# Patient Record
Sex: Female | Born: 1940 | Race: White | Hispanic: No | Smoking: Never smoker
Health system: Southern US, Community
[De-identification: ages and names within clinical notes are randomized; demographics above are authoritative.]

## PROBLEM LIST (undated history)

## (undated) DIAGNOSIS — I10 Essential (primary) hypertension: Secondary | ICD-10-CM

## (undated) DIAGNOSIS — K5790 Diverticulosis of intestine, part unspecified, without perforation or abscess without bleeding: Secondary | ICD-10-CM

## (undated) DIAGNOSIS — M109 Gout, unspecified: Secondary | ICD-10-CM

## (undated) DIAGNOSIS — G4730 Sleep apnea, unspecified: Secondary | ICD-10-CM

## (undated) DIAGNOSIS — R609 Edema, unspecified: Secondary | ICD-10-CM

## (undated) DIAGNOSIS — C50919 Malignant neoplasm of unspecified site of unspecified female breast: Secondary | ICD-10-CM

## (undated) DIAGNOSIS — M1990 Unspecified osteoarthritis, unspecified site: Secondary | ICD-10-CM

## (undated) DIAGNOSIS — I82409 Acute embolism and thrombosis of unspecified deep veins of unspecified lower extremity: Secondary | ICD-10-CM

## (undated) DIAGNOSIS — I219 Acute myocardial infarction, unspecified: Secondary | ICD-10-CM

## (undated) DIAGNOSIS — I2510 Atherosclerotic heart disease of native coronary artery without angina pectoris: Secondary | ICD-10-CM

## (undated) DIAGNOSIS — G20 Parkinson's disease: Secondary | ICD-10-CM

## (undated) DIAGNOSIS — J45909 Unspecified asthma, uncomplicated: Secondary | ICD-10-CM

## (undated) DIAGNOSIS — E785 Hyperlipidemia, unspecified: Secondary | ICD-10-CM

## (undated) DIAGNOSIS — I252 Old myocardial infarction: Secondary | ICD-10-CM

## (undated) HISTORY — DX: Sleep apnea, unspecified: G47.30

## (undated) HISTORY — PX: FLEXIBLE SIGMOIDOSCOPY: SHX1649

## (undated) HISTORY — DX: Unspecified asthma, uncomplicated: J45.909

## (undated) HISTORY — PX: HEMICOLECTOMY: SHX854

## (undated) HISTORY — PX: BREAST LUMPECTOMY: SHX2

## (undated) HISTORY — PX: APPENDECTOMY: SHX54

## (undated) HISTORY — DX: Hyperlipidemia, unspecified: E78.5

## (undated) HISTORY — PX: CATARACT EXTRACTION: SUR2

## (undated) HISTORY — DX: Acute myocardial infarction, unspecified: I21.9

## (undated) HISTORY — DX: Edema, unspecified: R60.9

## (undated) HISTORY — DX: Diverticulosis of intestine, part unspecified, without perforation or abscess without bleeding: K57.90

## (undated) HISTORY — DX: Atherosclerotic heart disease of native coronary artery without angina pectoris: I25.10

## (undated) HISTORY — DX: Gout, unspecified: M10.9

## (undated) HISTORY — DX: Acute embolism and thrombosis of unspecified deep veins of unspecified lower extremity: I82.409

## (undated) HISTORY — DX: Unspecified osteoarthritis, unspecified site: M19.90

## (undated) HISTORY — PX: COLONOSCOPY: SHX174

## (undated) HISTORY — PX: ABDOMINAL HYSTERECTOMY: SHX81

---

## 2002-10-08 DIAGNOSIS — I219 Acute myocardial infarction, unspecified: Secondary | ICD-10-CM

## 2002-10-08 HISTORY — DX: Acute myocardial infarction, unspecified: I21.9

## 2002-10-08 HISTORY — PX: ORIF ANKLE FRACTURE: SUR919

## 2004-06-09 HISTORY — PX: ESOPHAGOGASTRODUODENOSCOPY: SHX1529

## 2004-08-30 ENCOUNTER — Ambulatory Visit: Payer: Self-pay | Admitting: Oncology

## 2004-09-07 ENCOUNTER — Inpatient Hospital Stay: Payer: Self-pay | Admitting: Internal Medicine

## 2004-09-07 ENCOUNTER — Other Ambulatory Visit: Payer: Self-pay

## 2004-09-08 ENCOUNTER — Other Ambulatory Visit: Payer: Self-pay

## 2004-09-12 ENCOUNTER — Other Ambulatory Visit: Payer: Self-pay

## 2004-10-10 ENCOUNTER — Encounter: Payer: Self-pay | Admitting: Internal Medicine

## 2004-11-08 ENCOUNTER — Encounter: Payer: Self-pay | Admitting: Internal Medicine

## 2004-12-06 ENCOUNTER — Encounter: Payer: Self-pay | Admitting: Internal Medicine

## 2005-01-06 ENCOUNTER — Encounter: Payer: Self-pay | Admitting: Internal Medicine

## 2005-01-09 ENCOUNTER — Encounter: Payer: Self-pay | Admitting: Internal Medicine

## 2005-02-05 ENCOUNTER — Encounter: Payer: Self-pay | Admitting: Internal Medicine

## 2005-03-08 ENCOUNTER — Encounter: Payer: Self-pay | Admitting: Internal Medicine

## 2005-03-24 ENCOUNTER — Other Ambulatory Visit: Payer: Self-pay

## 2005-03-24 ENCOUNTER — Emergency Department: Payer: Self-pay | Admitting: Unknown Physician Specialty

## 2005-04-07 ENCOUNTER — Encounter: Payer: Self-pay | Admitting: Internal Medicine

## 2005-05-06 ENCOUNTER — Ambulatory Visit: Payer: Self-pay

## 2005-05-08 ENCOUNTER — Encounter: Payer: Self-pay | Admitting: Internal Medicine

## 2005-05-11 ENCOUNTER — Ambulatory Visit: Payer: Self-pay | Admitting: Internal Medicine

## 2005-05-11 ENCOUNTER — Ambulatory Visit: Payer: Self-pay

## 2005-05-25 ENCOUNTER — Ambulatory Visit: Payer: Self-pay | Admitting: Surgery

## 2005-06-05 ENCOUNTER — Ambulatory Visit: Payer: Self-pay | Admitting: Surgery

## 2005-06-08 ENCOUNTER — Encounter: Payer: Self-pay | Admitting: Internal Medicine

## 2005-07-08 ENCOUNTER — Encounter: Payer: Self-pay | Admitting: Internal Medicine

## 2005-08-06 ENCOUNTER — Emergency Department: Payer: Self-pay | Admitting: Emergency Medicine

## 2005-08-08 ENCOUNTER — Encounter: Payer: Self-pay | Admitting: Internal Medicine

## 2005-09-07 ENCOUNTER — Encounter: Payer: Self-pay | Admitting: Internal Medicine

## 2005-09-07 ENCOUNTER — Ambulatory Visit: Payer: Self-pay | Admitting: Oncology

## 2005-10-08 ENCOUNTER — Encounter: Payer: Self-pay | Admitting: Internal Medicine

## 2005-11-08 ENCOUNTER — Encounter: Payer: Self-pay | Admitting: Internal Medicine

## 2005-12-06 ENCOUNTER — Encounter: Payer: Self-pay | Admitting: Internal Medicine

## 2006-01-06 ENCOUNTER — Encounter: Payer: Self-pay | Admitting: Internal Medicine

## 2006-02-05 ENCOUNTER — Encounter: Payer: Self-pay | Admitting: Internal Medicine

## 2006-03-04 ENCOUNTER — Ambulatory Visit: Payer: Self-pay | Admitting: Internal Medicine

## 2006-03-08 ENCOUNTER — Encounter: Payer: Self-pay | Admitting: Internal Medicine

## 2006-03-22 ENCOUNTER — Ambulatory Visit: Payer: Self-pay | Admitting: Internal Medicine

## 2006-04-07 ENCOUNTER — Encounter: Payer: Self-pay | Admitting: Internal Medicine

## 2006-05-08 ENCOUNTER — Encounter: Payer: Self-pay | Admitting: Internal Medicine

## 2006-06-08 ENCOUNTER — Encounter: Payer: Self-pay | Admitting: Internal Medicine

## 2006-07-08 ENCOUNTER — Encounter: Payer: Self-pay | Admitting: Internal Medicine

## 2006-08-08 ENCOUNTER — Encounter: Payer: Self-pay | Admitting: Internal Medicine

## 2006-08-14 ENCOUNTER — Emergency Department: Payer: Self-pay | Admitting: General Practice

## 2006-09-07 ENCOUNTER — Encounter: Payer: Self-pay | Admitting: Internal Medicine

## 2006-09-27 ENCOUNTER — Ambulatory Visit: Payer: Self-pay | Admitting: Oncology

## 2006-10-08 ENCOUNTER — Encounter: Payer: Self-pay | Admitting: Internal Medicine

## 2006-11-08 ENCOUNTER — Encounter: Payer: Self-pay | Admitting: Internal Medicine

## 2006-11-08 ENCOUNTER — Ambulatory Visit: Payer: Self-pay | Admitting: Oncology

## 2006-12-07 ENCOUNTER — Encounter: Payer: Self-pay | Admitting: Internal Medicine

## 2007-01-07 ENCOUNTER — Encounter: Payer: Self-pay | Admitting: Internal Medicine

## 2007-02-06 ENCOUNTER — Encounter: Payer: Self-pay | Admitting: Internal Medicine

## 2007-03-09 ENCOUNTER — Encounter: Payer: Self-pay | Admitting: Internal Medicine

## 2007-04-08 ENCOUNTER — Encounter: Payer: Self-pay | Admitting: Internal Medicine

## 2007-05-09 ENCOUNTER — Encounter: Payer: Self-pay | Admitting: Internal Medicine

## 2007-06-09 ENCOUNTER — Encounter: Payer: Self-pay | Admitting: Internal Medicine

## 2007-09-09 ENCOUNTER — Ambulatory Visit: Payer: Self-pay | Admitting: Oncology

## 2007-09-29 ENCOUNTER — Ambulatory Visit: Payer: Self-pay | Admitting: Oncology

## 2007-10-09 ENCOUNTER — Ambulatory Visit: Payer: Self-pay | Admitting: Oncology

## 2008-10-07 ENCOUNTER — Ambulatory Visit: Payer: Self-pay | Admitting: Internal Medicine

## 2008-10-08 ENCOUNTER — Ambulatory Visit: Payer: Self-pay | Admitting: Oncology

## 2008-10-20 ENCOUNTER — Ambulatory Visit: Payer: Self-pay | Admitting: Oncology

## 2008-11-08 ENCOUNTER — Ambulatory Visit: Payer: Self-pay | Admitting: Oncology

## 2009-05-09 ENCOUNTER — Ambulatory Visit: Payer: Self-pay | Admitting: Internal Medicine

## 2009-07-19 ENCOUNTER — Encounter: Payer: Self-pay | Admitting: Cardiology

## 2009-08-08 ENCOUNTER — Encounter: Payer: Self-pay | Admitting: Cardiology

## 2009-12-15 ENCOUNTER — Ambulatory Visit: Payer: Self-pay | Admitting: Oncology

## 2010-01-05 ENCOUNTER — Ambulatory Visit: Payer: Self-pay | Admitting: Oncology

## 2010-01-06 ENCOUNTER — Ambulatory Visit: Payer: Self-pay | Admitting: Oncology

## 2010-02-01 ENCOUNTER — Ambulatory Visit: Payer: Self-pay | Admitting: Oncology

## 2010-12-29 ENCOUNTER — Ambulatory Visit: Payer: Self-pay | Admitting: Surgery

## 2011-03-29 ENCOUNTER — Ambulatory Visit: Payer: Self-pay | Admitting: Internal Medicine

## 2011-07-17 ENCOUNTER — Ambulatory Visit: Payer: Self-pay | Admitting: Ophthalmology

## 2011-07-24 ENCOUNTER — Ambulatory Visit: Payer: Self-pay | Admitting: Ophthalmology

## 2011-07-25 ENCOUNTER — Ambulatory Visit: Payer: Self-pay | Admitting: Internal Medicine

## 2013-01-22 ENCOUNTER — Ambulatory Visit: Payer: Self-pay | Admitting: Internal Medicine

## 2014-02-21 DIAGNOSIS — I10 Essential (primary) hypertension: Secondary | ICD-10-CM | POA: Insufficient documentation

## 2014-02-21 DIAGNOSIS — G4733 Obstructive sleep apnea (adult) (pediatric): Secondary | ICD-10-CM | POA: Insufficient documentation

## 2014-02-21 DIAGNOSIS — M109 Gout, unspecified: Secondary | ICD-10-CM | POA: Insufficient documentation

## 2014-02-23 ENCOUNTER — Ambulatory Visit: Payer: Self-pay

## 2014-07-09 DIAGNOSIS — N183 Chronic kidney disease, stage 3 unspecified: Secondary | ICD-10-CM | POA: Insufficient documentation

## 2014-07-09 DIAGNOSIS — E1122 Type 2 diabetes mellitus with diabetic chronic kidney disease: Secondary | ICD-10-CM | POA: Insufficient documentation

## 2014-08-06 ENCOUNTER — Ambulatory Visit: Payer: Self-pay

## 2014-08-10 DIAGNOSIS — I82509 Chronic embolism and thrombosis of unspecified deep veins of unspecified lower extremity: Secondary | ICD-10-CM | POA: Insufficient documentation

## 2014-09-10 DIAGNOSIS — E782 Mixed hyperlipidemia: Secondary | ICD-10-CM | POA: Insufficient documentation

## 2014-09-10 DIAGNOSIS — I071 Rheumatic tricuspid insufficiency: Secondary | ICD-10-CM | POA: Insufficient documentation

## 2016-01-21 ENCOUNTER — Emergency Department
Admission: EM | Admit: 2016-01-21 | Discharge: 2016-01-21 | Disposition: A | Discharge status: Home / Self Care | Payer: Medicare Other | Attending: Emergency Medicine | Admitting: Emergency Medicine

## 2016-01-21 ENCOUNTER — Emergency Department: Payer: Medicare Other

## 2016-01-21 ENCOUNTER — Encounter: Payer: Self-pay | Admitting: Emergency Medicine

## 2016-01-21 DIAGNOSIS — Q438 Other specified congenital malformations of intestine: Secondary | ICD-10-CM | POA: Diagnosis not present

## 2016-01-21 DIAGNOSIS — Z853 Personal history of malignant neoplasm of breast: Secondary | ICD-10-CM | POA: Insufficient documentation

## 2016-01-21 DIAGNOSIS — I10 Essential (primary) hypertension: Secondary | ICD-10-CM | POA: Diagnosis not present

## 2016-01-21 DIAGNOSIS — I252 Old myocardial infarction: Secondary | ICD-10-CM | POA: Diagnosis not present

## 2016-01-21 DIAGNOSIS — K6389 Other specified diseases of intestine: Secondary | ICD-10-CM

## 2016-01-21 DIAGNOSIS — R109 Unspecified abdominal pain: Secondary | ICD-10-CM | POA: Diagnosis present

## 2016-01-21 DIAGNOSIS — K529 Noninfective gastroenteritis and colitis, unspecified: Secondary | ICD-10-CM

## 2016-01-21 HISTORY — DX: Essential (primary) hypertension: I10

## 2016-01-21 HISTORY — DX: Old myocardial infarction: I25.2

## 2016-01-21 HISTORY — DX: Malignant neoplasm of unspecified site of unspecified female breast: C50.919

## 2016-01-21 LAB — URINALYSIS COMPLETE WITH MICROSCOPIC (ARMC ONLY)
BILIRUBIN URINE: NEGATIVE
GLUCOSE, UA: NEGATIVE mg/dL
Hgb urine dipstick: NEGATIVE
KETONES UR: NEGATIVE mg/dL
Leukocytes, UA: NEGATIVE
NITRITE: NEGATIVE
PH: 5 (ref 5.0–8.0)
Protein, ur: NEGATIVE mg/dL
RBC / HPF: NONE SEEN RBC/hpf (ref 0–5)
Specific Gravity, Urine: 1.012 (ref 1.005–1.030)

## 2016-01-21 LAB — CBC
HCT: 34.8 % — ABNORMAL LOW (ref 35.0–47.0)
Hemoglobin: 11.8 g/dL — ABNORMAL LOW (ref 12.0–16.0)
MCH: 30.7 pg (ref 26.0–34.0)
MCHC: 34 g/dL (ref 32.0–36.0)
MCV: 90.3 fL (ref 80.0–100.0)
PLATELETS: 212 10*3/uL (ref 150–440)
RBC: 3.86 MIL/uL (ref 3.80–5.20)
RDW: 14.9 % — AB (ref 11.5–14.5)
WBC: 6.2 10*3/uL (ref 3.6–11.0)

## 2016-01-21 LAB — COMPREHENSIVE METABOLIC PANEL
ALBUMIN: 4 g/dL (ref 3.5–5.0)
ALK PHOS: 103 U/L (ref 38–126)
ALT: 26 U/L (ref 14–54)
ANION GAP: 7 (ref 5–15)
AST: 27 U/L (ref 15–41)
BILIRUBIN TOTAL: 0.9 mg/dL (ref 0.3–1.2)
BUN: 22 mg/dL — AB (ref 6–20)
CALCIUM: 9.6 mg/dL (ref 8.9–10.3)
CO2: 26 mmol/L (ref 22–32)
CREATININE: 1.29 mg/dL — AB (ref 0.44–1.00)
Chloride: 103 mmol/L (ref 101–111)
GFR calc Af Amer: 45 mL/min — ABNORMAL LOW (ref >60)
GFR calc non Af Amer: 39 mL/min — ABNORMAL LOW (ref >60)
GLUCOSE: 114 mg/dL — AB (ref 65–99)
Potassium: 4.4 mmol/L (ref 3.5–5.1)
SODIUM: 136 mmol/L (ref 135–145)
TOTAL PROTEIN: 8 g/dL (ref 6.5–8.1)

## 2016-01-21 LAB — TROPONIN I: Troponin I: <0.03 ng/mL (ref <0.031)

## 2016-01-21 LAB — LIPASE, BLOOD: Lipase: 23 U/L (ref 11–51)

## 2016-01-21 MED ORDER — DIATRIZOATE MEGLUMINE & SODIUM 66-10 % PO SOLN
15.0000 mL | Freq: Once | ORAL | Status: AC
  Administered 2016-01-21: 15 mL via ORAL

## 2016-01-21 MED ORDER — METRONIDAZOLE 500 MG PO TABS
500.0000 mg | ORAL_TABLET | Freq: Once | ORAL | Status: AC
  Administered 2016-01-21: 500 mg via ORAL
  Filled 2016-01-21: qty 1

## 2016-01-21 MED ORDER — METRONIDAZOLE 500 MG PO TABS: 500.0000 mg | ORAL_TABLET | Freq: Three times a day (TID) | ORAL | Status: AC

## 2016-01-21 MED ORDER — CIPROFLOXACIN HCL 500 MG PO TABS
500.0000 mg | ORAL_TABLET | Freq: Once | ORAL | Status: AC
  Administered 2016-01-21: 500 mg via ORAL
  Filled 2016-01-21: qty 1

## 2016-01-21 MED ORDER — ONDANSETRON HCL 4 MG/2ML IJ SOLN
4.0000 mg | Freq: Once | INTRAMUSCULAR | Status: DC
  Filled 2016-01-21: qty 2

## 2016-01-21 MED ORDER — CIPROFLOXACIN HCL 500 MG PO TABS: 500.0000 mg | ORAL_TABLET | Freq: Two times a day (BID) | ORAL | Status: DC

## 2016-01-21 MED ORDER — MORPHINE SULFATE (PF) 2 MG/ML IV SOLN
2.0000 mg | Freq: Once | INTRAVENOUS | Status: DC
  Filled 2016-01-21: qty 1

## 2016-01-21 MED ORDER — SODIUM CHLORIDE 0.9 % IV BOLUS (SEPSIS)
1000.0000 mL | Freq: Once | INTRAVENOUS | Status: AC
  Administered 2016-01-21: 1000 mL via INTRAVENOUS

## 2016-01-21 MED ORDER — IOPAMIDOL (ISOVUE-370) INJECTION 76%
75.0000 mL | Freq: Once | INTRAVENOUS | Status: AC | PRN
  Administered 2016-01-21: 75 mL via INTRAVENOUS

## 2016-01-21 NOTE — ED Notes (Signed)
Patient transported to CT 

## 2016-01-21 NOTE — ED Provider Notes (Signed)
[facility] Emergency Department Provider Note  ____________________________________________  Time seen: Approximately 430 PM  I have reviewed the triage vital signs and the nursing notes.   HISTORY  Chief Complaint Abdominal Pain   HPI Melinda Maxwell is a 75 y.o. female with a history of breast cancer and heart attack as well as PE on Xarelto who is presenting to the emergency department today with right-sided abdominal pain. She says it has been worsening over the past week. Initially she thought it was from her doing some yard work and lifting some heavy sticks.  However, she had an episode of diarrhea today and the pain is been worsening over the course the week. She says the pain is constant and goes to a 9 out of 10 and sharp when she moves. She says it is also associated with shortness of breath and hurts to breathe. The patient says she has been compliant with her Xarelto. Denies any nausea or vomiting or worsening pain with eating.   Past Medical History  Diagnosis Date  . Breast cancer ([place])   . MI, old   . Hypertension     There are no active problems to display for this patient.   Past Surgical History  Procedure Laterality Date  . Breast lumpectomy      L breast    No current outpatient prescriptions on file.  Allergies Sulfa antibiotics  No family history on file.  Social History Social History  Substance Use Topics  . Smoking status: Never Smoker   . Smokeless tobacco: None  . Alcohol Use: No    Review of Systems Constitutional: No fever/chills Eyes: No visual changes. ENT: No sore throat. Cardiovascular: Denies chest pain. Respiratory: Denies shortness of breath. Gastrointestinal:  No nausea, no vomiting.  No constipation. Genitourinary: Negative for dysuria. Musculoskeletal: Negative for back pain. Skin: Negative for rash. Neurological: Negative for headaches, focal weakness or numbness.  10-point ROS otherwise  negative.  ____________________________________________   PHYSICAL EXAM:  VITAL SIGNS: ED Triage Vitals  Enc Vitals Group     BP 01/21/16 1239 138/56 mmHg     Pulse Rate 01/21/16 1239 48     Resp 01/21/16 1239 18     Temp 01/21/16 1239 98.2 F (36.8 C)     Temp Source 01/21/16 1239 Oral     SpO2 01/21/16 1239 96 %     Weight 01/21/16 1239 196 lb (88.905 kg)     Height 01/21/16 1239 5\' 2"  (1.575 m)     Head Cir --      Peak Flow --      Pain Score 01/21/16 1241 5     Pain Loc --      Pain Edu? --      Excl. in [place]? --     Constitutional: Alert and oriented. Well appearing and in no acute distress. Eyes: Conjunctivae are normal. PERRL. EOMI. Head: Atraumatic. Nose: No congestion/rhinnorhea. Mouth/Throat: Mucous membranes are moist.   Neck: No stridor.   Cardiovascular: Normal rate, regular rhythm. Grossly normal heart sounds.  Good peripheral circulation. Respiratory: Normal respiratory effort.  No retractions. Lungs CTAB. Gastrointestinal: Soft With tenderness palpation of the right upper quadrant without a positive Murphy sign. Most tenderness seems to be constipated to the right lower quadrant. Mild guarding but without any rebound. No distention.  No CVA tenderness. Musculoskeletal: No lower extremity tenderness nor edema.  No joint effusions. Neurologic:  Normal speech and language. No gross focal neurologic deficits are appreciated.  No gait instability. Skin:  Skin is warm, dry and intact. No rash noted. No vesicles. Psychiatric: Mood and affect are normal. Speech and behavior are normal.  ____________________________________________   LABS (all labs ordered are listed, but only abnormal results are displayed)  Labs Reviewed  COMPREHENSIVE METABOLIC PANEL - Abnormal; Notable for the following:    Glucose, Bld 114 (*)    BUN 22 (*)    Creatinine, Ser 1.29 (*)    GFR calc non Af Amer 39 (*)    GFR calc Af Amer 45 (*)    All other components within normal limits   CBC - Abnormal; Notable for the following:    Hemoglobin 11.8 (*)    HCT 34.8 (*)    RDW 14.9 (*)    All other components within normal limits  URINALYSIS COMPLETEWITH MICROSCOPIC ([facility] ONLY) - Abnormal; Notable for the following:    Color, Urine YELLOW (*)    APPearance CLEAR (*)    Bacteria, UA RARE (*)    Squamous Epithelial / LPF 0-5 (*)    All other components within normal limits  LIPASE, BLOOD  TROPONIN I   ____________________________________________  EKG  ED ECG REPORT I, Doran Stabler, the attending physician, personally viewed and interpreted this ECG.   Date: 01/21/2016  EKG Time: 1655  Rate: 70  Rhythm: normal sinus rhythm  Axis: Normal axis  Intervals:none  ST&T Change: No ST segment elevation or depression. No abnormal T-wave inversion. Poor baseline. ST depression in inferior leads and read by the machine is likely secondary to poor baseline.  ____________________________________________  RADIOLOGY   IMPRESSION: 1. Epiploic appendagitis at the hepatic flexure. 2. Negative for pulmonary embolism.   Electronically Signed By: Monte Fantasia M.D. On: 01/21/2016 18:17       ____________________________________________   PROCEDURES  ____________________________________________   INITIAL IMPRESSION / ASSESSMENT AND PLAN / ED COURSE  Pertinent labs & imaging results that were available during my care of the patient were reviewed by me and considered in my medical decision making (see chart for details).  ----------------------------------------- 7:11 PM on 01/21/2016 -----------------------------------------  Patient resting comfortably at this time. I discussed the diagnosis with the patient as well as her daughter who is at bedside. We discussed epiploic appendagitis and what causes it. I also discussed case with surgeon, Dr. Heath Lark, who recommended antibiotics as well as pain control. The patient says that she is unable to  take ibuprofen because she is on Xarelto. We'll continue with aspirin as well as Tylenol for pain control. We'll discharge with Cipro and Flagyl. Discussed return precautions including any worsening or concerning symptoms. Will give follow-up with surgery. ____________________________________________   FINAL CLINICAL IMPRESSION(S) / ED DIAGNOSES  Epiploic appendigitis    Orbie Pyo, MD 01/21/16 [phone]

## 2016-01-21 NOTE — ED Notes (Signed)
R lower abdominal pain x 5 days. Felt feverish yesterday. Diarrhea today.

## 2016-01-21 NOTE — ED Notes (Addendum)
Pt reports cramping R. Sided pain X4 days, reports some diarrhea but no other symptoms. Pt reports it is diff breathing b/c pf the pain

## 2017-09-12 ENCOUNTER — Other Ambulatory Visit: Payer: Self-pay | Admitting: Internal Medicine

## 2017-09-12 DIAGNOSIS — M7122 Synovial cyst of popliteal space [Baker], left knee: Secondary | ICD-10-CM

## 2017-09-12 DIAGNOSIS — I824Y2 Acute embolism and thrombosis of unspecified deep veins of left proximal lower extremity: Secondary | ICD-10-CM

## 2017-09-13 ENCOUNTER — Ambulatory Visit
Admission: RE | Admit: 2017-09-13 | Discharge: 2017-09-13 | Disposition: A | Discharge status: Home / Self Care | Payer: Medicare Other | Source: Ambulatory Visit | Attending: Internal Medicine | Admitting: Internal Medicine

## 2017-09-13 DIAGNOSIS — R600 Localized edema: Secondary | ICD-10-CM | POA: Diagnosis not present

## 2017-09-13 DIAGNOSIS — M7122 Synovial cyst of popliteal space [Baker], left knee: Secondary | ICD-10-CM | POA: Insufficient documentation

## 2017-09-13 DIAGNOSIS — I824Y2 Acute embolism and thrombosis of unspecified deep veins of left proximal lower extremity: Secondary | ICD-10-CM | POA: Insufficient documentation

## 2018-01-15 DIAGNOSIS — I208 Other forms of angina pectoris: Secondary | ICD-10-CM | POA: Diagnosis present

## 2018-01-15 DIAGNOSIS — I2089 Other forms of angina pectoris: Secondary | ICD-10-CM | POA: Diagnosis present

## 2018-02-11 ENCOUNTER — Ambulatory Visit
Admission: RE | Admit: 2018-02-11 | Discharge: 2018-02-12 | Disposition: A | Discharge status: Home / Self Care | Payer: Medicare Other | Source: Ambulatory Visit | Attending: Internal Medicine | Admitting: Internal Medicine

## 2018-02-11 ENCOUNTER — Encounter: Payer: Self-pay | Admitting: *Deleted

## 2018-02-11 ENCOUNTER — Encounter
Admission: RE | Disposition: A | Discharge status: Home / Self Care | Payer: Self-pay | Source: Ambulatory Visit | Attending: Internal Medicine

## 2018-02-11 ENCOUNTER — Other Ambulatory Visit: Payer: Self-pay

## 2018-02-11 DIAGNOSIS — M109 Gout, unspecified: Secondary | ICD-10-CM | POA: Diagnosis not present

## 2018-02-11 DIAGNOSIS — I208 Other forms of angina pectoris: Secondary | ICD-10-CM | POA: Diagnosis present

## 2018-02-11 DIAGNOSIS — I10 Essential (primary) hypertension: Secondary | ICD-10-CM | POA: Diagnosis not present

## 2018-02-11 DIAGNOSIS — Z882 Allergy status to sulfonamides status: Secondary | ICD-10-CM | POA: Insufficient documentation

## 2018-02-11 DIAGNOSIS — Z8249 Family history of ischemic heart disease and other diseases of the circulatory system: Secondary | ICD-10-CM | POA: Insufficient documentation

## 2018-02-11 DIAGNOSIS — Z7982 Long term (current) use of aspirin: Secondary | ICD-10-CM | POA: Insufficient documentation

## 2018-02-11 DIAGNOSIS — M1990 Unspecified osteoarthritis, unspecified site: Secondary | ICD-10-CM | POA: Insufficient documentation

## 2018-02-11 DIAGNOSIS — E782 Mixed hyperlipidemia: Secondary | ICD-10-CM | POA: Insufficient documentation

## 2018-02-11 DIAGNOSIS — G4730 Sleep apnea, unspecified: Secondary | ICD-10-CM | POA: Diagnosis not present

## 2018-02-11 DIAGNOSIS — I25118 Atherosclerotic heart disease of native coronary artery with other forms of angina pectoris: Secondary | ICD-10-CM | POA: Insufficient documentation

## 2018-02-11 DIAGNOSIS — Z86718 Personal history of other venous thrombosis and embolism: Secondary | ICD-10-CM | POA: Insufficient documentation

## 2018-02-11 DIAGNOSIS — I252 Old myocardial infarction: Secondary | ICD-10-CM | POA: Diagnosis not present

## 2018-02-11 DIAGNOSIS — R079 Chest pain, unspecified: Secondary | ICD-10-CM

## 2018-02-11 DIAGNOSIS — Z955 Presence of coronary angioplasty implant and graft: Secondary | ICD-10-CM | POA: Insufficient documentation

## 2018-02-11 DIAGNOSIS — J45909 Unspecified asthma, uncomplicated: Secondary | ICD-10-CM | POA: Insufficient documentation

## 2018-02-11 HISTORY — PX: LEFT HEART CATH AND CORONARY ANGIOGRAPHY: CATH118249

## 2018-02-11 HISTORY — PX: CORONARY STENT INTERVENTION: CATH118234

## 2018-02-11 LAB — BASIC METABOLIC PANEL
ANION GAP: 8 (ref 5–15)
BUN: 17 mg/dL (ref 6–20)
CALCIUM: 9.6 mg/dL (ref 8.9–10.3)
CO2: 24 mmol/L (ref 22–32)
Chloride: 105 mmol/L (ref 101–111)
Creatinine, Ser: 0.99 mg/dL (ref 0.44–1.00)
GFR calc Af Amer: >60 mL/min (ref >60)
GFR, EST NON AFRICAN AMERICAN: 53 mL/min — AB (ref >60)
GLUCOSE: 152 mg/dL — AB (ref 65–99)
Potassium: 4.5 mmol/L (ref 3.5–5.1)
Sodium: 137 mmol/L (ref 135–145)

## 2018-02-11 LAB — MAGNESIUM: Magnesium: 2 mg/dL (ref 1.7–2.4)

## 2018-02-11 LAB — POCT ACTIVATED CLOTTING TIME: Activated Clotting Time: 368 seconds

## 2018-02-11 SURGERY — LEFT HEART CATH AND CORONARY ANGIOGRAPHY
Anesthesia: Moderate Sedation

## 2018-02-11 MED ORDER — ASPIRIN EC 81 MG PO TBEC
81.0000 mg | DELAYED_RELEASE_TABLET | Freq: Every day | ORAL | Status: DC
  Administered 2018-02-12: 81 mg via ORAL
  Filled 2018-02-11: qty 1

## 2018-02-11 MED ORDER — MORPHINE SULFATE (PF) 2 MG/ML IV SOLN
2.0000 mg | Freq: Once | INTRAVENOUS | Status: AC
Start: 2018-02-11 — End: 2018-02-11
  Administered 2018-02-11: 2 mg via INTRAVENOUS

## 2018-02-11 MED ORDER — SODIUM CHLORIDE 0.9 % IV SOLN
INTRAVENOUS | Status: DC
  Administered 2018-02-11: 17:00:00 via INTRAVENOUS

## 2018-02-11 MED ORDER — NITROGLYCERIN 1 MG/10 ML FOR IR/CATH LAB
INTRA_ARTERIAL | Status: DC | PRN
  Administered 2018-02-11: 200 ug
  Administered 2018-02-11 (×2): 200 ug via INTRACORONARY

## 2018-02-11 MED ORDER — HYDRALAZINE HCL 20 MG/ML IJ SOLN: 5.0000 mg | INTRAMUSCULAR | Status: AC | PRN

## 2018-02-11 MED ORDER — SPIRONOLACTONE 25 MG PO TABS
25.0000 mg | ORAL_TABLET | Freq: Every day | ORAL | Status: DC
  Administered 2018-02-12: 25 mg via ORAL
  Filled 2018-02-11: qty 1

## 2018-02-11 MED ORDER — ISOSORBIDE MONONITRATE ER 30 MG PO TB24
30.0000 mg | ORAL_TABLET | Freq: Every day | ORAL | Status: DC
  Administered 2018-02-12: 30 mg via ORAL
  Filled 2018-02-11: qty 1

## 2018-02-11 MED ORDER — ENOXAPARIN SODIUM 40 MG/0.4ML ~~LOC~~ SOLN: 40.0000 mg | SUBCUTANEOUS | Status: DC

## 2018-02-11 MED ORDER — FENTANYL CITRATE (PF) 100 MCG/2ML IJ SOLN
INTRAMUSCULAR | Status: DC | PRN
  Administered 2018-02-11 (×2): 25 ug via INTRAVENOUS

## 2018-02-11 MED ORDER — SIMVASTATIN 40 MG PO TABS
40.0000 mg | ORAL_TABLET | Freq: Every day | ORAL | Status: DC
  Administered 2018-02-12: 40 mg via ORAL
  Filled 2018-02-11 (×2): qty 1
  Filled 2018-02-11: qty 2

## 2018-02-11 MED ORDER — SODIUM CHLORIDE 0.9 % IV SOLN: 250.0000 mL | INTRAVENOUS | Status: DC | PRN

## 2018-02-11 MED ORDER — SODIUM CHLORIDE 0.9% FLUSH: 3.0000 mL | Freq: Two times a day (BID) | INTRAVENOUS | Status: DC

## 2018-02-11 MED ORDER — NITROGLYCERIN 5 MG/ML IV SOLN
INTRAVENOUS | Status: AC
  Filled 2018-02-11: qty 10

## 2018-02-11 MED ORDER — HYDRALAZINE HCL 20 MG/ML IJ SOLN
INTRAMUSCULAR | Status: DC | PRN
  Administered 2018-02-11: 5 mg via INTRAVENOUS
  Administered 2018-02-11: 10 mg via INTRAVENOUS

## 2018-02-11 MED ORDER — MIDAZOLAM HCL 2 MG/2ML IJ SOLN
INTRAMUSCULAR | Status: DC | PRN
  Administered 2018-02-11: 1 mg via INTRAVENOUS

## 2018-02-11 MED ORDER — CLOPIDOGREL BISULFATE 75 MG PO TABS
ORAL_TABLET | ORAL | Status: AC
  Filled 2018-02-11: qty 8

## 2018-02-11 MED ORDER — HYDRALAZINE HCL 20 MG/ML IJ SOLN
INTRAMUSCULAR | Status: AC
  Filled 2018-02-11: qty 1

## 2018-02-11 MED ORDER — ONDANSETRON HCL 4 MG/2ML IJ SOLN: 4.0000 mg | Freq: Four times a day (QID) | INTRAMUSCULAR | Status: DC | PRN

## 2018-02-11 MED ORDER — FENTANYL CITRATE (PF) 100 MCG/2ML IJ SOLN
INTRAMUSCULAR | Status: AC
  Filled 2018-02-11: qty 2

## 2018-02-11 MED ORDER — NYSTATIN 100000 UNIT/GM EX POWD
Freq: Two times a day (BID) | CUTANEOUS | Status: DC
  Administered 2018-02-11 – 2018-02-12 (×2): via TOPICAL
  Filled 2018-02-11 (×2): qty 15

## 2018-02-11 MED ORDER — BIVALIRUDIN BOLUS VIA INFUSION - CUPID
INTRAVENOUS | Status: DC | PRN
  Administered 2018-02-11: 60.525 mg via INTRAVENOUS

## 2018-02-11 MED ORDER — CLOPIDOGREL BISULFATE 75 MG PO TABS
75.0000 mg | ORAL_TABLET | Freq: Every day | ORAL | Status: DC
  Administered 2018-02-12: 75 mg via ORAL
  Filled 2018-02-11: qty 1

## 2018-02-11 MED ORDER — IOPAMIDOL (ISOVUE-300) INJECTION 61%
INTRAVENOUS | Status: DC | PRN
  Administered 2018-02-11: 95 mL via INTRA_ARTERIAL

## 2018-02-11 MED ORDER — ALLOPURINOL 300 MG PO TABS
300.0000 mg | ORAL_TABLET | Freq: Every day | ORAL | Status: DC
  Administered 2018-02-12: 300 mg via ORAL
  Filled 2018-02-11 (×2): qty 1

## 2018-02-11 MED ORDER — IOPAMIDOL (ISOVUE-300) INJECTION 61%
INTRAVENOUS | Status: DC | PRN
  Administered 2018-02-11: 80 mL via INTRA_ARTERIAL

## 2018-02-11 MED ORDER — ACETAMINOPHEN 325 MG PO TABS: 650.0000 mg | ORAL_TABLET | ORAL | Status: DC | PRN

## 2018-02-11 MED ORDER — NITROGLYCERIN 0.4 MG SL SUBL: 0.4000 mg | SUBLINGUAL_TABLET | SUBLINGUAL | Status: DC | PRN

## 2018-02-11 MED ORDER — MIDAZOLAM HCL 2 MG/2ML IJ SOLN
INTRAMUSCULAR | Status: AC
  Filled 2018-02-11: qty 2

## 2018-02-11 MED ORDER — SODIUM CHLORIDE 0.9 % IV SOLN
INTRAVENOUS | Status: AC | PRN
  Administered 2018-02-11: 1.75 mg/kg/h via INTRAVENOUS

## 2018-02-11 MED ORDER — SODIUM CHLORIDE 0.9 % WEIGHT BASED INFUSION: 3.0000 mL/kg/h | INTRAVENOUS | Status: DC

## 2018-02-11 MED ORDER — MORPHINE SULFATE (PF) 2 MG/ML IV SOLN
INTRAVENOUS | Status: AC
  Filled 2018-02-11: qty 1

## 2018-02-11 MED ORDER — ASPIRIN 81 MG PO CHEW: 81.0000 mg | CHEWABLE_TABLET | ORAL | Status: DC

## 2018-02-11 MED ORDER — LABETALOL HCL 5 MG/ML IV SOLN: 10.0000 mg | INTRAVENOUS | Status: AC | PRN

## 2018-02-11 MED ORDER — NAPHAZOLINE-PHENIRAMINE 0.025-0.3 % OP SOLN
1.0000 [drp] | Freq: Four times a day (QID) | OPHTHALMIC | Status: DC | PRN
  Filled 2018-02-11 (×4): qty 15

## 2018-02-11 MED ORDER — SODIUM CHLORIDE 0.9% FLUSH: 3.0000 mL | INTRAVENOUS | Status: DC | PRN

## 2018-02-11 MED ORDER — BIVALIRUDIN TRIFLUOROACETATE 250 MG IV SOLR
INTRAVENOUS | Status: AC
  Filled 2018-02-11: qty 250

## 2018-02-11 MED ORDER — ASPIRIN 81 MG PO CHEW
CHEWABLE_TABLET | ORAL | Status: AC
  Filled 2018-02-11: qty 3

## 2018-02-11 MED ORDER — SODIUM CHLORIDE 0.9 % WEIGHT BASED INFUSION: 1.0000 mL/kg/h | INTRAVENOUS | Status: DC

## 2018-02-11 MED ORDER — SODIUM CHLORIDE 0.9% FLUSH
3.0000 mL | Freq: Two times a day (BID) | INTRAVENOUS | Status: DC
  Administered 2018-02-11 – 2018-02-12 (×2): 3 mL via INTRAVENOUS

## 2018-02-11 MED ORDER — METOPROLOL TARTRATE 50 MG PO TABS
50.0000 mg | ORAL_TABLET | Freq: Two times a day (BID) | ORAL | Status: DC
  Administered 2018-02-11: 50 mg via ORAL
  Filled 2018-02-11 (×2): qty 1

## 2018-02-11 MED ORDER — LIDOCAINE HCL (PF) 1 % IJ SOLN
INTRAMUSCULAR | Status: AC
  Filled 2018-02-11: qty 30

## 2018-02-11 SURGICAL SUPPLY — 20 items
BALLN TREK RX 2.5X12 (BALLOONS) ×4
BALLN ~~LOC~~ TREK RX 3.25X12 (BALLOONS) ×4
BALLN ~~LOC~~ TREK RX 3.5X12 (BALLOONS) ×4
BALLOON TREK RX 2.5X12 (BALLOONS) ×2 IMPLANT
BALLOON ~~LOC~~ TREK RX 3.25X12 (BALLOONS) ×2 IMPLANT
BALLOON ~~LOC~~ TREK RX 3.5X12 (BALLOONS) ×2 IMPLANT
CATH INFINITI 5FR ANG PIGTAIL (CATHETERS) ×4 IMPLANT
CATH INFINITI 5FR JL4 (CATHETERS) ×4 IMPLANT
CATH INFINITI JR4 5F (CATHETERS) ×4 IMPLANT
CATH LAUNCHER 6FR EBU 3.75 (CATHETERS) ×4 IMPLANT
DEVICE INFLAT 30 PLUS (MISCELLANEOUS) ×4 IMPLANT
KIT MANI 3VAL PERCEP (MISCELLANEOUS) ×4 IMPLANT
NEEDLE PERC 18GX7CM (NEEDLE) ×4 IMPLANT
PACK CARDIAC CATH (CUSTOM PROCEDURE TRAY) ×4 IMPLANT
SHEATH AVANTI 6FR X 11CM (SHEATH) ×4 IMPLANT
SHEATH PINNACLE 5F 10CM (SHEATH) ×4 IMPLANT
STENT SIERRA 2.75 X 12 MM (Permanent Stent) ×4 IMPLANT
STENT SIERRA 3.25 X 15 MM (Permanent Stent) ×4 IMPLANT
SUT SILK 0 FSL (SUTURE) ×4 IMPLANT
WIRE GUIDERIGHT .035X150 (WIRE) ×4 IMPLANT

## 2018-02-11 NOTE — OR Nursing (Signed)
Patient called me to bedside c/o "lightheadedness" with vision "hard to focus"  bp 137/81, hr 84, frequent pvc's noted on cardiac monitor.  No other symptoms per the patient.  Paged dr. Saunders Revel and notified of the above assessment.  Dr. Saunders Revel states he will be here in 5 to 69min to assess the patient, no new orders given.  Pupils equal and reactive bilaterally, hand grips equal bilaterally, and flexion/dorsiflexion of feet equal bilaterally.

## 2018-02-11 NOTE — Progress Notes (Signed)
MD at bedside, lab work ordered and completed. Per MD, patient ok to transfer to assigned room. WIll transfer to assigned room

## 2018-02-11 NOTE — Brief Op Note (Signed)
BRIEF CARDIAC CATHETERIZATION NOTE  DATE: 02/11/2018 TIME: 9:45 AM  PATIENT:  Melinda Maxwell  77 y.o. female  PRE-OPERATIVE DIAGNOSIS:  Stable angina and abnormal stress test  POST-OPERATIVE DIAGNOSIS:  Same  PROCEDURE:  Procedure(s): LEFT HEART CATH AND CORONARY ANGIOGRAPHY (Left) CORONARY STENT INTERVENTION (N/A)  SURGEON:  Surgeon(s) and Role: Panel 1:    Corey Skains, MD - Primary Panel 2:    Nelva Bush, MD - Primary  FINDINGS: 1. Severe mid LAD disease as well as moderate proximal RCA and mid LCx stenoses (see Dr. Alveria Apley diagnostic catheterization report for details). 2. Successful PCI to mid LAD with two overlapping Xience [place] drug eluting stents (which overlap previously placed proximal LAD stent.  RECOMMENDATIONS: 1. DAPT for at least 6 months, ideally indefinitely.  If rivaroxaban is to be restarted, aspirin can be stopped once rivaroxaban resumed.  Nelva Bush, MD [facility] Pager: [phone]

## 2018-02-12 ENCOUNTER — Encounter: Payer: Self-pay | Admitting: *Deleted

## 2018-02-12 DIAGNOSIS — I25118 Atherosclerotic heart disease of native coronary artery with other forms of angina pectoris: Secondary | ICD-10-CM | POA: Diagnosis not present

## 2018-02-12 LAB — BASIC METABOLIC PANEL
Anion gap: 10 (ref 5–15)
BUN: 15 mg/dL (ref 6–20)
CO2: 23 mmol/L (ref 22–32)
Calcium: 9.6 mg/dL (ref 8.9–10.3)
Chloride: 105 mmol/L (ref 101–111)
Creatinine, Ser: 0.99 mg/dL (ref 0.44–1.00)
GFR, EST NON AFRICAN AMERICAN: 53 mL/min — AB (ref >60)
GLUCOSE: 119 mg/dL — AB (ref 65–99)
POTASSIUM: 4.3 mmol/L (ref 3.5–5.1)
Sodium: 138 mmol/L (ref 135–145)

## 2018-02-12 LAB — CBC
HEMATOCRIT: 33.7 % — AB (ref 35.0–47.0)
HEMOGLOBIN: 11.2 g/dL — AB (ref 12.0–16.0)
MCH: 30.6 pg (ref 26.0–34.0)
MCHC: 33.1 g/dL (ref 32.0–36.0)
MCV: 92.4 fL (ref 80.0–100.0)
Platelets: 235 10*3/uL (ref 150–440)
RBC: 3.65 MIL/uL — AB (ref 3.80–5.20)
RDW: 15.6 % — ABNORMAL HIGH (ref 11.5–14.5)
WBC: 6.3 10*3/uL (ref 3.6–11.0)

## 2018-02-12 MED ORDER — CLOPIDOGREL BISULFATE 75 MG PO TABS: 75.0000 mg | ORAL_TABLET | Freq: Every day | ORAL | 4 refills | Status: DC

## 2018-02-12 NOTE — Care Management Note (Signed)
Case Management Note  Patient Details  Name: Melinda Maxwell MRN: [id number] Date of Birth: [birth year]  Subjective/Objective:      Admitted to [facility] under observation status with the diagnosis of angina. Lives alone. Daughter is [name] [phone]). Will see Dr, Ouida Sills May 15th. Prescriptions are filled at [facility]. Home Health per an agency out of [place], doesn't remember name. No skilled nursing. No home oxygen. Standard walker and cane in the home. Did have MedLine. Takes care of all basic activities of daily living herself, drives. No falls. Too good appetite. Daughter will transport               Action/Plan: Discharge to home today. No needs identified   Expected Discharge Date:  02/12/18               Expected Discharge Plan:     In-House Referral:     Discharge planning Services     Post Acute Care Choice:    Choice offered to:     DME Arranged:    DME Agency:     HH Arranged:    HH Agency:     Status of Service:     If discussed at [employer], dates discussed:    Additional Comments:  Shelbie Ammons, RN MSN CCM Care Management [phone] 02/12/2018, 12:09 PM

## 2018-02-12 NOTE — Progress Notes (Addendum)
Cardiovascular and Pulmonary Nurse Navigator Note:    Patient presented to [place] yesterday for scheduled outpatient cardiac cath.   Patient for discharge today. [name] is patient's primary cardiologist.  Dr. Saunders Revel placed referral to Cardiac Rehab with dx of Coronary Stents.    Past Medical History:  . Arthritis  . Asthma without status asthmaticus, unspecified  . Breast cancer , unspecified (CMS-HCC)  . Coronary artery disease  . Diverticulosis  . DVT (deep venous thrombosis) (CMS-HCC)  . Edema  . Gout  . Hyperlipidemia, unspecified  . Hypertension  . Myocardial infarction (CMS-HCC) 2004  . Sleep apnea   [place]  Order# [id number]  Reading physician: Nelva Bush, MD Ordering physician: Nelva Bush, MD Study date: 02/11/18  Physicians   Panel Physicians Referring Physician Case Authorizing Physician  End, Harrell Gave, MD (Primary)    Procedures   CORONARY STENT INTERVENTION  Conclusion   Conclusions: 1. Three vessel coronary artery disease with sequential 90% and 60% mid LAD stenoses, as well as mild to moderate, non-critical disease involving the LCx and RCA.  Please see Dr. Alveria Apley diagnostic catheterization report for details. 2. Successful PCI to mid LAD with placement of two overlapping Xience [place] drug eluting stents (3.25 x 15 mm proximal and 2.75 x 12 mm distal) with 0% residual stenosis and TIMI-3 flow.  Recommendations: 1. Dual antiplatelet therapy with aspirin and clopidogrel for at least 6 months, ideally longer.  Aspirin could be discontinued if rivaroxaban has been resumed. 2. Aggressive secondary prevention. 3. Overnight extended recovery and outpatient follow-up with [name].   Patient education: Patient sitting up on side of bed.  "Angioplasty and Stents" booklet given and reviewed with patient.  Patient reports having had previous stent placed many years ago.  Reviewed the location of CAD and where  stents were placed.?Informed patient she will be given a stent card. Explained the purpose of the stent card. Instructed patient to keep stent card in her wallet.  ? Discussed modifiable risk factors including controlling blood pressure, cholesterol, and blood sugar; following heart healthy diet; maintaining healthy weight; exercise; and smoking cessation, if applicable. ?Note: Patient is a NEVER smoker.??? ? Discussed cardiac medications including rationale for taking, mechanisms of action, and side effects. Stressed the importance of taking medications as prescribed.  ? Discussed emergency plan for heart attack symptoms. Patient verbalized understanding of need to call 911 and not to drive herself?or have her family drive her to the?ER if having cardiac symptoms / chest pain.  ? Heart healthy diet of low sodium, low fat, low cholesterol heart healthy diet discussed. Information on diet provided.?  Smoking Cessation - Patient is a NEVER smoker.  ?  Exercise - Benefits of exercised discussed. Informed patient cardiologist has referred her to outpatient Cardiac Rehab. An overview of the program was provided.  Patient participated in Cardiac Rehab approximately 15 years. Patient stated, "It is great program. It helped me a lot."   Patient requested this RN to contact her insurance to check on her co-pay.   Oncologist, Cardiac Rehab dept.,  contacted her insurance and her copay will be $20/day. Patient informed. Patient would like to discuss this with her daughter before making a decision. Patient agreeable to being contacted in approximately one week regarding Cardiac Rehab. ?  [facility] for Women Living with Heart Disease. Information provided. Patient invited and encouraged to attend. Brochure given.   Patient appreciative of the above information.  Roanna Epley, RN, BSN, [facility]? [place] [facility]?P[facility] Rehab  Cardiovascular &?Pulmonary Nurse  Navigator  Direct Line: [phone]  Department Phone #: [phone] Fax: [phone]? Email Address: [email or web address] ?

## 2018-02-12 NOTE — Progress Notes (Signed)
Patient up to ambulate in hallway and was unsteady with her usual cane, more steady with a walker. HR in the 40's bp elevated at 170/60. [name] notified orders received to hold metoprolol. And reassess this evening and discharge.

## 2018-02-12 NOTE — Care Management Obs Status (Signed)
[place] NOTIFICATION   Patient Details  Name: Melinda Maxwell MRN: [id number] Date of Birth: [birth year]   Medicare Observation Status Notification Given:  Yes permission to x    Shelbie Ammons, RN 02/12/2018, 12:09 PM

## 2018-02-12 NOTE — Progress Notes (Signed)
Patient discharged via wheelchair and private vehicle. IV removed and catheter intact. All discharge instructions given and patient verbalizes understanding. Tele removed and returned. No prescriptions given to patient No distress noted.   

## 2018-02-12 NOTE — Discharge Summary (Signed)
[facility] Cardiology Discharge Summary  [name] MRN: [id number] DOB/AGE: 12/11/1940 77 y.o.  Admit date: 02/11/2018 Discharge date: 02/12/2018  Primary Discharge Diagnosis: Ischemic chest pain I20.9 and Coronary artery disease with angina I25.119 Secondary Discharge Diagnosis high blood pressure and high cholesterol  Significant Diagnostic Studies: Cardiac cath with left ventricular angiogram and selective coronary injection as well as PCI and stent placement of left anterior descending.  Hospital Course: The patient was admitted to specials for cardiac cath with selective coronary angiogram after full consent, risk and benefits explained, and time out called with all approprate details voiced and discussed. The patient has had progressive canadian class 3 angina with high probability risk stress test consistent with ischemic chest pain and or anginal equivalent with coronary artery risk factors including high blood pressure and high cholesterol. The procedure was performed without complication and it revealed normal left ventricular function with ejection fraction of 55%.  It was found that the patient had severe 1 vessel coronary atherosclerosis with significant left anterior descending stenosis requiring further intervention. Therefore, the patient had a PCI and drug eluding stent placed without complication. The patient has been ambulating without further significant symptoms and has reached his maximal hospital benefit and will be discharged to home in good condition.  Cardiac rehabilitation has been discussed and recommended. Medication management of cardiovascular risk factors will be given post discharge and modified as an outpatient.   Discharge Exam: Blood pressure (!) 177/50, pulse (!) 47, temperature 97.9 F (36.6 C), resp. rate 18, height 5\' 1"  (1.549 m), weight 178 lb (80.7 kg), SpO2 99 %.  Constitutional: A[place] oriented to person, place, and time. No distress.   HENT: No nasal discharge.  Head: Normocephalic and atraumatic.  Eyes: Pupils are equal and round. No discharge.  Neck: Normal range of motion. Neck supple. No JVD present. No thyromegaly present.  Cardiovascular: Normal rate, regular rhythm, normal S1 S2, no gallop, no friction rub. No murmur Pulmonary/Chest: Effort normal, No stridor. No respiratory distress. no wheezes.  no rales.    Abdominal: Soft. Bowel sounds are normal.  no distension.  no tenderness. There is no rebound and no guarding.  Musculoskeletal: No edema, no cyanosis, normal pulses, no bleeding, Normal range of motion. no tenderness.  Neurological:  alert and oriented to person, place, and time. Coordination normal.  Skin: Skin is warm and dry. No rash noted. No erythema. No pallor.  Psychiatric:  normal mood and affect. behavior is normal.    Labs:   Lab Results  Component Value Date   WBC 6.3 02/12/2018   HGB 11.2 (L) 02/12/2018   HCT 33.7 (L) 02/12/2018   MCV 92.4 02/12/2018   PLT 235 02/12/2018    Recent Labs  Lab 02/12/18 0528  NA 138  K 4.3  CL 105  CO2 23  BUN 15  CREATININE 0.99  CALCIUM 9.6  GLUCOSE 119*    EKG: NSR without evidence of new changes  FOLLOW UP IN ONE TO TWO WEEKS Discharge Instructions    AMB Referral to Cardiac Rehabilitation - Phase II   Complete by:  As directed    Diagnosis:   Stable Angina Coronary Stents       Allergies as of 02/12/2018      Reactions   Benzocaine Other (See Comments)   Unknown   Lisinopril Cough   Neomycin Other (See Comments)   On allergy testing   Sulfamethoxazole-trimethoprim Other (See Comments)   Unknown   Sulfa Antibiotics  Rash      Medication List    STOP taking these medications   diclofenac sodium 1 % Gel Commonly known as:  VOLTAREN   XARELTO 20 MG Tabs tablet Generic drug:  rivaroxaban     TAKE these medications   ALLERGY EYE OP Place 1 drop into both eyes daily as needed (for dry eyes).   allopurinol 300 MG  tablet Commonly known as:  ZYLOPRIM Take 300 mg by mouth daily.   aspirin EC 81 MG tablet Take 81 mg by mouth daily.   clopidogrel 75 MG tablet Commonly known as:  PLAVIX Take 1 tablet (75 mg total) by mouth daily with breakfast.   isosorbide mononitrate 30 MG 24 hr tablet Commonly known as:  IMDUR Take 30 mg by mouth daily.   metoprolol tartrate 50 MG tablet Commonly known as:  LOPRESSOR Take 50 mg by mouth 2 (two) times daily.   nitroGLYCERIN 0.4 MG SL tablet Commonly known as:  NITROSTAT Place 0.4 mg under the tongue every 5 (five) minutes as needed for chest pain.   nystatin powder Commonly known as:  MYCOSTATIN/NYSTOP Apply 1 g topically daily.   pantoprazole 40 MG tablet Commonly known as:  PROTONIX Take 40 mg by mouth daily.   simvastatin 40 MG tablet Commonly known as:  ZOCOR Take 40 mg by mouth daily.   spironolactone 25 MG tablet Commonly known as:  ALDACTONE Take 25 mg by mouth daily.   torsemide 10 MG tablet Commonly known as:  DEMADEX Take 10 mg by mouth daily as needed for fluid.   VASELINE Gel Generic drug:  white petrolatum Apply 1 application topically as needed (for eyes and face).        THE PATIENT  SHALL BRING ALL MEDICATIONS TO FOLLOW UP APPOINTMENT  Signed:  Corey Skains MD, [facility] 02/12/2018, 7:37 AM

## 2018-02-18 ENCOUNTER — Encounter: Payer: Self-pay | Admitting: Emergency Medicine

## 2018-02-18 ENCOUNTER — Inpatient Hospital Stay
Admission: EM | Admit: 2018-02-18 | Discharge: 2018-02-26 | DRG: 689 | Disposition: A | Discharge status: Skilled Nursing Facility | Payer: Medicare Other | Attending: Internal Medicine | Admitting: Internal Medicine

## 2018-02-18 ENCOUNTER — Other Ambulatory Visit: Payer: Self-pay

## 2018-02-18 DIAGNOSIS — R05 Cough: Secondary | ICD-10-CM

## 2018-02-18 DIAGNOSIS — I5033 Acute on chronic diastolic (congestive) heart failure: Secondary | ICD-10-CM | POA: Diagnosis present

## 2018-02-18 DIAGNOSIS — I480 Paroxysmal atrial fibrillation: Secondary | ICD-10-CM | POA: Diagnosis present

## 2018-02-18 DIAGNOSIS — R000 Tachycardia, unspecified: Secondary | ICD-10-CM | POA: Diagnosis not present

## 2018-02-18 DIAGNOSIS — Z79899 Other long term (current) drug therapy: Secondary | ICD-10-CM | POA: Diagnosis not present

## 2018-02-18 DIAGNOSIS — Z923 Personal history of irradiation: Secondary | ICD-10-CM

## 2018-02-18 DIAGNOSIS — N390 Urinary tract infection, site not specified: Secondary | ICD-10-CM | POA: Diagnosis present

## 2018-02-18 DIAGNOSIS — Z955 Presence of coronary angioplasty implant and graft: Secondary | ICD-10-CM

## 2018-02-18 DIAGNOSIS — Z881 Allergy status to other antibiotic agents status: Secondary | ICD-10-CM

## 2018-02-18 DIAGNOSIS — I110 Hypertensive heart disease with heart failure: Secondary | ICD-10-CM | POA: Diagnosis present

## 2018-02-18 DIAGNOSIS — I248 Other forms of acute ischemic heart disease: Secondary | ICD-10-CM | POA: Diagnosis present

## 2018-02-18 DIAGNOSIS — R059 Cough, unspecified: Secondary | ICD-10-CM

## 2018-02-18 DIAGNOSIS — Z90710 Acquired absence of both cervix and uterus: Secondary | ICD-10-CM | POA: Diagnosis not present

## 2018-02-18 DIAGNOSIS — N3000 Acute cystitis without hematuria: Secondary | ICD-10-CM

## 2018-02-18 DIAGNOSIS — Z7982 Long term (current) use of aspirin: Secondary | ICD-10-CM | POA: Diagnosis not present

## 2018-02-18 DIAGNOSIS — I2510 Atherosclerotic heart disease of native coronary artery without angina pectoris: Secondary | ICD-10-CM | POA: Diagnosis present

## 2018-02-18 DIAGNOSIS — R531 Weakness: Secondary | ICD-10-CM

## 2018-02-18 DIAGNOSIS — Z884 Allergy status to anesthetic agent status: Secondary | ICD-10-CM | POA: Diagnosis not present

## 2018-02-18 DIAGNOSIS — E860 Dehydration: Secondary | ICD-10-CM | POA: Diagnosis present

## 2018-02-18 DIAGNOSIS — N179 Acute kidney failure, unspecified: Secondary | ICD-10-CM | POA: Diagnosis present

## 2018-02-18 DIAGNOSIS — Z7902 Long term (current) use of antithrombotics/antiplatelets: Secondary | ICD-10-CM | POA: Diagnosis not present

## 2018-02-18 DIAGNOSIS — I252 Old myocardial infarction: Secondary | ICD-10-CM | POA: Diagnosis not present

## 2018-02-18 DIAGNOSIS — Z888 Allergy status to other drugs, medicaments and biological substances status: Secondary | ICD-10-CM

## 2018-02-18 DIAGNOSIS — Z882 Allergy status to sulfonamides status: Secondary | ICD-10-CM

## 2018-02-18 DIAGNOSIS — B9620 Unspecified Escherichia coli [E. coli] as the cause of diseases classified elsewhere: Secondary | ICD-10-CM | POA: Diagnosis present

## 2018-02-18 DIAGNOSIS — Z9221 Personal history of antineoplastic chemotherapy: Secondary | ICD-10-CM

## 2018-02-18 DIAGNOSIS — R7989 Other specified abnormal findings of blood chemistry: Secondary | ICD-10-CM

## 2018-02-18 DIAGNOSIS — R778 Other specified abnormalities of plasma proteins: Secondary | ICD-10-CM

## 2018-02-18 DIAGNOSIS — Z853 Personal history of malignant neoplasm of breast: Secondary | ICD-10-CM | POA: Diagnosis not present

## 2018-02-18 LAB — CBC WITH DIFFERENTIAL/PLATELET
BASOS PCT: 0 %
Basophils Absolute: 0 10*3/uL (ref 0–0.1)
Eosinophils Absolute: 0 10*3/uL (ref 0–0.7)
Eosinophils Relative: 0 %
HEMATOCRIT: 31.7 % — AB (ref 35.0–47.0)
Hemoglobin: 10.7 g/dL — ABNORMAL LOW (ref 12.0–16.0)
Lymphocytes Relative: 6 %
Lymphs Abs: 0.4 10*3/uL — ABNORMAL LOW (ref 1.0–3.6)
MCH: 30.7 pg (ref 26.0–34.0)
MCHC: 33.6 g/dL (ref 32.0–36.0)
MCV: 91.5 fL (ref 80.0–100.0)
Monocytes Absolute: 0.7 10*3/uL (ref 0.2–0.9)
Monocytes Relative: 10 %
NEUTROS ABS: 5.9 10*3/uL (ref 1.4–6.5)
NEUTROS PCT: 84 %
PLATELETS: 201 10*3/uL (ref 150–440)
RBC: 3.47 MIL/uL — ABNORMAL LOW (ref 3.80–5.20)
RDW: 15.5 % — AB (ref 11.5–14.5)
WBC: 7 10*3/uL (ref 3.6–11.0)

## 2018-02-18 LAB — COMPREHENSIVE METABOLIC PANEL
ALBUMIN: 3.5 g/dL (ref 3.5–5.0)
ALT: 21 U/L (ref 14–54)
ANION GAP: 10 (ref 5–15)
AST: 55 U/L — ABNORMAL HIGH (ref 15–41)
Alkaline Phosphatase: 94 U/L (ref 38–126)
BUN: 40 mg/dL — ABNORMAL HIGH (ref 6–20)
CHLORIDE: 102 mmol/L (ref 101–111)
CO2: 21 mmol/L — ABNORMAL LOW (ref 22–32)
Calcium: 9.4 mg/dL (ref 8.9–10.3)
Creatinine, Ser: 1.59 mg/dL — ABNORMAL HIGH (ref 0.44–1.00)
GFR calc Af Amer: 35 mL/min — ABNORMAL LOW (ref >60)
GFR calc non Af Amer: 30 mL/min — ABNORMAL LOW (ref >60)
GLUCOSE: 172 mg/dL — AB (ref 65–99)
POTASSIUM: 3.9 mmol/L (ref 3.5–5.1)
SODIUM: 133 mmol/L — AB (ref 135–145)
TOTAL PROTEIN: 7.6 g/dL (ref 6.5–8.1)
Total Bilirubin: 1.2 mg/dL (ref 0.3–1.2)

## 2018-02-18 LAB — URINALYSIS, COMPLETE (UACMP) WITH MICROSCOPIC
BILIRUBIN URINE: NEGATIVE
GLUCOSE, UA: NEGATIVE mg/dL
Ketones, ur: NEGATIVE mg/dL
NITRITE: NEGATIVE
PH: 5 (ref 5.0–8.0)
Protein, ur: 100 mg/dL — AB
Specific Gravity, Urine: 1.014 (ref 1.005–1.030)
Squamous Epithelial / LPF: NONE SEEN (ref 0–5)
WBC, UA: >50 WBC/hpf — ABNORMAL HIGH (ref 0–5)

## 2018-02-18 LAB — TROPONIN I
TROPONIN I: 0.22 ng/mL — AB (ref <0.03)
Troponin I: 0.05 ng/mL (ref <0.03)
Troponin I: 0.07 ng/mL (ref <0.03)

## 2018-02-18 MED ORDER — ONDANSETRON HCL 4 MG/2ML IJ SOLN: 4.0000 mg | Freq: Four times a day (QID) | INTRAMUSCULAR | Status: DC | PRN

## 2018-02-18 MED ORDER — ACETAMINOPHEN 325 MG PO TABS
650.0000 mg | ORAL_TABLET | Freq: Four times a day (QID) | ORAL | Status: DC | PRN
  Administered 2018-02-18 – 2018-02-20 (×3): 650 mg via ORAL
  Filled 2018-02-18 (×3): qty 2

## 2018-02-18 MED ORDER — DOCUSATE SODIUM 100 MG PO CAPS
100.0000 mg | ORAL_CAPSULE | Freq: Two times a day (BID) | ORAL | Status: DC
  Administered 2018-02-18 – 2018-02-25 (×15): 100 mg via ORAL
  Filled 2018-02-18 (×15): qty 1

## 2018-02-18 MED ORDER — WHITE PETROLATUM EX OINT
1.0000 "application " | TOPICAL_OINTMENT | CUTANEOUS | Status: DC | PRN
  Filled 2018-02-18: qty 5

## 2018-02-18 MED ORDER — FLUCONAZOLE 100 MG PO TABS
100.0000 mg | ORAL_TABLET | Freq: Every day | ORAL | Status: DC
  Administered 2018-02-18 – 2018-02-25 (×8): 100 mg via ORAL
  Filled 2018-02-18 (×8): qty 1

## 2018-02-18 MED ORDER — SODIUM CHLORIDE 0.9 % IV SOLN
1.0000 g | Freq: Once | INTRAVENOUS | Status: AC
  Administered 2018-02-18: 1 g via INTRAVENOUS
  Filled 2018-02-18: qty 10

## 2018-02-18 MED ORDER — NYSTATIN 100000 UNIT/GM EX POWD
1.0000 g | Freq: Every day | CUTANEOUS | Status: DC | PRN
  Filled 2018-02-18: qty 15

## 2018-02-18 MED ORDER — CEFTRIAXONE SODIUM 1 G IJ SOLR
1.0000 g | INTRAMUSCULAR | Status: DC
Start: 2018-02-19 — End: 2018-02-20
  Administered 2018-02-19 – 2018-02-20 (×2): 1 g via INTRAVENOUS
  Filled 2018-02-18 (×2): qty 1

## 2018-02-18 MED ORDER — ACETAMINOPHEN 650 MG RE SUPP: 650.0000 mg | Freq: Four times a day (QID) | RECTAL | Status: DC | PRN

## 2018-02-18 MED ORDER — METOPROLOL TARTRATE 50 MG PO TABS
50.0000 mg | ORAL_TABLET | Freq: Two times a day (BID) | ORAL | Status: DC
  Administered 2018-02-18 – 2018-02-19 (×2): 50 mg via ORAL
  Filled 2018-02-18 (×2): qty 1

## 2018-02-18 MED ORDER — ASPIRIN EC 81 MG PO TBEC
81.0000 mg | DELAYED_RELEASE_TABLET | Freq: Every day | ORAL | Status: DC
  Administered 2018-02-18 – 2018-02-26 (×9): 81 mg via ORAL
  Filled 2018-02-18 (×9): qty 1

## 2018-02-18 MED ORDER — CLOPIDOGREL BISULFATE 75 MG PO TABS
75.0000 mg | ORAL_TABLET | Freq: Every day | ORAL | Status: DC
  Administered 2018-02-18 – 2018-02-26 (×9): 75 mg via ORAL
  Filled 2018-02-18 (×9): qty 1

## 2018-02-18 MED ORDER — PANTOPRAZOLE SODIUM 40 MG PO TBEC
40.0000 mg | DELAYED_RELEASE_TABLET | Freq: Every day | ORAL | Status: DC
  Administered 2018-02-18 – 2018-02-26 (×9): 40 mg via ORAL
  Filled 2018-02-18 (×9): qty 1

## 2018-02-18 MED ORDER — ISOSORBIDE MONONITRATE ER 30 MG PO TB24
30.0000 mg | ORAL_TABLET | Freq: Every day | ORAL | Status: DC
  Administered 2018-02-18 – 2018-02-26 (×9): 30 mg via ORAL
  Filled 2018-02-18 (×9): qty 1

## 2018-02-18 MED ORDER — ONDANSETRON HCL 4 MG PO TABS: 4.0000 mg | ORAL_TABLET | Freq: Four times a day (QID) | ORAL | Status: DC | PRN

## 2018-02-18 MED ORDER — NAPHAZOLINE-PHENIRAMINE 0.025-0.3 % OP SOLN
1.0000 [drp] | Freq: Every day | OPHTHALMIC | Status: DC | PRN
  Filled 2018-02-18: qty 5

## 2018-02-18 MED ORDER — SIMVASTATIN 20 MG PO TABS
40.0000 mg | ORAL_TABLET | Freq: Every day | ORAL | Status: DC
  Administered 2018-02-18: 18:00:00 40 mg via ORAL
  Filled 2018-02-18: qty 2

## 2018-02-18 MED ORDER — SODIUM CHLORIDE 0.9 % IV SOLN
INTRAVENOUS | Status: AC
  Administered 2018-02-18: 17:00:00 via INTRAVENOUS

## 2018-02-18 MED ORDER — ENOXAPARIN SODIUM 30 MG/0.3ML ~~LOC~~ SOLN
30.0000 mg | SUBCUTANEOUS | Status: DC
  Administered 2018-02-18 – 2018-02-19 (×2): 30 mg via SUBCUTANEOUS
  Filled 2018-02-18 (×2): qty 0.3

## 2018-02-18 MED ORDER — NITROGLYCERIN 0.4 MG SL SUBL: 0.4000 mg | SUBLINGUAL_TABLET | SUBLINGUAL | Status: DC | PRN

## 2018-02-18 MED ORDER — SODIUM CHLORIDE 0.9 % IV SOLN
Freq: Once | INTRAVENOUS | Status: AC
  Administered 2018-02-18: 13:00:00 via INTRAVENOUS

## 2018-02-18 MED ORDER — CLOPIDOGREL BISULFATE 75 MG PO TABS: 75.0000 mg | ORAL_TABLET | Freq: Every day | ORAL | Status: DC

## 2018-02-18 MED ORDER — NYSTATIN 100000 UNIT/GM EX POWD
Freq: Three times a day (TID) | CUTANEOUS | Status: DC
  Administered 2018-02-18 – 2018-02-25 (×23): via TOPICAL
  Filled 2018-02-18: qty 15

## 2018-02-18 NOTE — H&P (Signed)
[facility] [place] NAME: Melinda Maxwell    MR#:  [id number]  DATE OF BIRTH:  03/28/1941  DATE OF ADMISSION:  02/18/2018  PRIMARY CARE PHYSICIAN: [name], MD   REQUESTING/REFERRING PHYSICIAN: Earleen Newport, MD  CHIEF COMPLAINT:    weakness HISTORY OF PRESENT ILLNESS:  Melinda Maxwell  is a 77 y.o. female with a known history of breast cancer status post chemo and radiation therapy, hypertension, history of coronary artery disease status post MI was seen by [name] and had cardiac cath with 2 stent placement.  For the past 2 to 3 days she was feeling very weak and with poor p.o. intake family members are concerned and brought her into the ED.  Urine looks abnormal.  Troponin at 0.05.  Patient is started on IV Rocephin for urinary tract infection  PAST MEDICAL HISTORY:   Past Medical History:  Diagnosis Date  . Breast cancer ([place])   . Hypertension   . MI, old     PAST SURGICAL HISTOIRY:   Past Surgical History:  Procedure Laterality Date  . ABDOMINAL HYSTERECTOMY    . APPENDECTOMY    . BREAST LUMPECTOMY     L breast  . CORONARY STENT INTERVENTION N/A 02/11/2018   Procedure: CORONARY STENT INTERVENTION;  Surgeon: Nelva Bush, MD;  Location: [facility];  Service: Cardiovascular;  Laterality: N/A;  . LEFT HEART CATH AND CORONARY ANGIOGRAPHY Left 02/11/2018   Procedure: LEFT HEART CATH AND CORONARY ANGIOGRAPHY;  Surgeon: Corey Skains, MD;  Location: [facility];  Service: Cardiovascular;  Laterality: Left;    SOCIAL HISTORY:   Social History   Tobacco Use  . Smoking status: Never Smoker  . Smokeless tobacco: Never Used  Substance Use Topics  . Alcohol use: No    FAMILY HISTORY:  History reviewed. No pertinent family history.  DRUG ALLERGIES:   Allergies  Allergen Reactions  . Benzocaine Other (See Comments)    Unknown  . Lisinopril Cough  . Neomycin Other (See  Comments)    On allergy testing  . Sulfamethoxazole-Trimethoprim Other (See Comments)    Unknown  . Sulfa Antibiotics Rash    REVIEW OF SYSTEMS:  CONSTITUTIONAL: No fever, fatigue , reports weakness.  EYES: No blurred or double vision.  EARS, NOSE, AND THROAT: No tinnitus or ear pain.  RESPIRATORY: No cough, shortness of breath, wheezing or hemoptysis.  CARDIOVASCULAR: No chest pain, orthopnea, edema.  GASTROINTESTINAL: No nausea, vomiting, diarrhea or abdominal pain.  GENITOURINARY: No dysuria, hematuria.  ENDOCRINE: No polyuria, nocturia,  HEMATOLOGY: No anemia, easy bruising or bleeding SKIN: No rash or lesion. MUSCULOSKELETAL: No joint pain or arthritis.   NEUROLOGIC: No tingling, numbness, weakness.  PSYCHIATRY: No anxiety or depression.   MEDICATIONS AT HOME:   Prior to Admission medications   Medication Sig Start Date End Date Taking? Authorizing Provider  allopurinol (ZYLOPRIM) 300 MG tablet Take 300 mg by mouth daily.   Yes [provider]  aspirin EC 81 MG tablet Take 81 mg by mouth daily.   Yes [provider]  clopidogrel (PLAVIX) 75 MG tablet Take 1 tablet (75 mg total) by mouth daily with breakfast. 02/12/18  Yes Corey Skains, MD  isosorbide mononitrate (IMDUR) 30 MG 24 hr tablet Take 30 mg by mouth daily. 01/14/18  Yes [provider]  losartan (COZAAR) 25 MG tablet Take 25 mg by mouth daily. 02/13/18  Yes [provider]  metoprolol  tartrate (LOPRESSOR) 50 MG tablet Take 50 mg by mouth 2 (two) times daily. 09/03/16  Yes [provider]  nystatin (MYCOSTATIN/NYSTOP) powder Apply 1 g topically daily as needed.  01/30/18  Yes [provider]  pantoprazole (PROTONIX) 40 MG tablet Take 40 mg by mouth daily. 12/31/17  Yes [provider]  simvastatin (ZOCOR) 40 MG tablet Take 40 mg by mouth daily.   Yes [provider]  spironolactone (ALDACTONE) 25 MG tablet Take 25 mg by mouth daily. 01/31/18  Yes  [provider]  Naphazoline-Pheniramine (ALLERGY EYE OP) Place 1 drop into both eyes daily as needed (for dry eyes).    [provider]  nitroGLYCERIN (NITROSTAT) 0.4 MG SL tablet Place 0.4 mg under the tongue every 5 (five) minutes as needed for chest pain.  12/31/17   [provider]  white petrolatum (VASELINE) GEL Apply 1 application topically as needed (for eyes and face).    [provider]      VITAL SIGNS:  Blood pressure (!) 173/58, pulse (!) 114, temperature (!) 103.1 F (39.5 C), temperature source Oral, resp. rate 18, height 5\' 1"  (1.549 m), weight 80.7 kg (178 lb), SpO2 (!) 86 %.  PHYSICAL EXAMINATION:  GENERAL:  77 y.o.-year-old patient lying in the bed with no acute distress.  Patient is reporting generalized weakness EYES: Pupils equal, round, reactive to light and accommodation. No scleral icterus. Extraocular muscles intact.  HEENT: Head atraumatic, normocephalic. Oropharynx and nasopharynx clear.  NECK:  Supple, no jugular venous distention. No thyroid enlargement, no tenderness.  LUNGS: Normal breath sounds bilaterally, no wheezing, rales,rhonchi or crepitation. No use of accessory muscles of respiration.  CARDIOVASCULAR: S1, S2 normal. No murmurs, rubs, or gallops.  ABDOMEN: Soft, nontender, nondistended. Bowel sounds present. No organomegaly or mass.  EXTREMITIES: No pedal edema, cyanosis, or clubbing.  NEUROLOGIC: Cranial nerves II through XII are intact. Muscle strength global weakness in all extremities. Sensation intact. Gait not checked.  PSYCHIATRIC: The patient is alert and oriented x 3.  SKIN: No obvious rash, lesion, or ulcer.   LABORATORY PANEL:   CBC Recent Labs  Lab 02/18/18 1058  WBC 7.0  HGB 10.7*  HCT 31.7*  PLT 201   ------------------------------------------------------------------------------------------------------------------  Chemistries  Recent Labs  Lab 02/18/18 1058  NA 133*  K 3.9  CL 102   CO2 21*  GLUCOSE 172*  BUN 40*  CREATININE 1.59*  CALCIUM 9.4  AST 55*  ALT 21  ALKPHOS 94  BILITOT 1.2   ------------------------------------------------------------------------------------------------------------------  Cardiac Enzymes Recent Labs  Lab 02/18/18 1058  TROPONINI 0.05*   ------------------------------------------------------------------------------------------------------------------  RADIOLOGY:  No results found.  EKG:   Orders placed or performed during the hospital encounter of 02/18/18  . ED EKG  . ED EKG  . EKG 12-Lead  . EKG 12-Lead  . EKG 12-Lead  . EKG 12-Lead    IMPRESSION AND PLAN:   Garielle Mroz  is a 77 y.o. female with a known history of breast cancer status post chemo and radiation therapy, hypertension, history of coronary artery disease status post MI was seen by [name] and had cardiac cath with 2 stent placement.  For the past 2 to 3 days she was feeling very weak and with poor p.o. intake family members are concerned and brought her into the ED.  Urine looks abnormal.  Troponin at 0.05.    #Acute cystitis with fever admit to MedSurg unit IV Rocephin  urine culture and sensitivity ordered IV fluids  #Elevated  troponin   cycle cardiac biomarkers patient denies any chest pain.  Initial troponin 0.05 ED physician has discussed with cardiology [name] consult placed to [name]  #AKI Poor p.o. Intake IV fluids and monitor renal function closely Holding Cozaar and allopurinol  #History of coronary artery disease status post MI, status post cardiac cath 1 week ago and 2 stents were placed Currently patient is a symptomatic continue home medication aspirin, Plavix, Imdur, beta-blocker and holding Cozaar in view of acute renal insufficiency   #Essential hypertension continue home medication Imdur, beta-blocker and holding Cozaar in view of acute renal insufficiency      All the records are reviewed and case discussed  with ED provider. Management plans discussed with the patient, family and they are in agreement.  CODE STATUS: fc   TOTAL TIME TAKING CARE OF THIS PATIENT: 41  minutes.   Note: This dictation was prepared with Dragon dictation along with smaller phrase technology. Any transcriptional errors that result from this process are unintentional.  Nicholes Mango M.D on 02/18/2018 at 3:43 PM  Between 7am to 6pm - Pager - [phone]  After 6pm go to [email or web address] - password [email or web address] [facility]  [facility]  Office  [phone]  CC: Primary care physician; [name], MD

## 2018-02-18 NOTE — ED Notes (Signed)
Pt cleaned up and repositioned in bed. 

## 2018-02-18 NOTE — ED Provider Notes (Addendum)
[facility] Emergency Department Provider Note       Time seen: ----------------------------------------- 10:42 AM on 02/18/2018 -----------------------------------------   I have reviewed the triage vital signs and the nursing notes.  HISTORY   Chief Complaint No chief complaint on file.    HPI Melinda Maxwell is a 77 y.o. female with a history of breast cancer, hypertension and MI who presents to the ED for weakness.  Patient reportedly had stents placed a week ago and was scheduled for follow-up today concerning same.  Family reports that she was weaker than normal.  Patient denies chest pain or difficulty breathing but does states she felt weak and reportedly had a fever at home.  She denies any chest pain or difficulty breathing.  Past Medical History:  Diagnosis Date  . Breast cancer ([place])   . Hypertension   . MI, old     Patient Active Problem List   Diagnosis Date Noted  . 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 ABDOMINAL HYSTERECTOMY    . APPENDECTOMY    . BREAST LUMPECTOMY     L breast  . CORONARY STENT INTERVENTION N/A 02/11/2018   Procedure: CORONARY STENT INTERVENTION;  Surgeon: Nelva Bush, MD;  Location: [place] [facility];  Service: Cardiovascular;  Laterality: N/A;  . LEFT HEART CATH AND CORONARY ANGIOGRAPHY Left 02/11/2018   Procedure: LEFT HEART CATH AND CORONARY ANGIOGRAPHY;  Surgeon: Corey Skains, MD;  Location: [facility] [facility];  Service: Cardiovascular;  Laterality: Left;    Allergies Benzocaine; Lisinopril; Neomycin; Sulfamethoxazole-trimethoprim; and Sulfa antibiotics  Social History Social History   Tobacco Use  . Smoking status: Never Smoker  . Smokeless tobacco: Never Used  Substance Use Topics  . Alcohol use: No  . Drug use: Never   Review of Systems Constitutional: Negative for fever. Cardiovascular: Negative for chest pain. Respiratory: Negative  for shortness of breath. Gastrointestinal: Negative for abdominal pain, vomiting and diarrhea. Musculoskeletal: Negative for back pain. Skin: Negative for rash. Neurological: Positive for generalized weakness  All systems negative/normal/unremarkable except as stated in the HPI  ____________________________________________   PHYSICAL EXAM:  VITAL SIGNS: ED Triage Vitals  Enc Vitals Group     BP      Pulse      Resp      Temp      Temp src      SpO2      Weight      Height      Head Circumference      Peak Flow      Pain Score      Pain Loc      Pain Edu?      Excl. in [place]?    Constitutional: Alert and oriented. Well appearing and in no distress. Eyes: Conjunctivae are normal. Normal extraocular movements. ENT   Head: Normocephalic and atraumatic.   Nose: No congestion/rhinnorhea.   Mouth/Throat: Mucous membranes are moist.   Neck: No stridor. Cardiovascular: Normal rate, regular rhythm. No murmurs, rubs, or gallops. Respiratory: Normal respiratory effort without tachypnea nor retractions. Breath sounds are clear and equal bilaterally. No wheezes/rales/rhonchi. Gastrointestinal: Soft and nontender. Normal bowel sounds Musculoskeletal: Nontender with normal range of motion in extremities. No lower extremity tenderness nor edema. Neurologic:  Normal speech and language. No gross focal neurologic deficits are appreciated.  Skin:  Skin is warm, dry and intact. No rash noted. Psychiatric: Mood and affect are normal. Speech and  behavior are normal.  ____________________________________________  EKG: Interpreted by me.  Sinus rhythm with PVCs and a rate of 101 bpm, bigeminy pattern, normal QT  ____________________________________________  ED COURSE:  As part of my medical decision making, I reviewed the following data within the [facility] History obtained from family if available, nursing notes, old chart and ekg, as well as notes from prior  ED visits. Patient presented for weakness, we will assess with labs and imaging as indicated at this time.   Procedures ____________________________________________   LABS (pertinent positives/negatives)  Labs Reviewed  CBC WITH DIFFERENTIAL/PLATELET - Abnormal; Notable for the following components:      Result Value   RBC 3.47 (*)    Hemoglobin 10.7 (*)    HCT 31.7 (*)    RDW 15.5 (*)    Lymphs Abs 0.4 (*)    All other components within normal limits  COMPREHENSIVE METABOLIC PANEL - Abnormal; Notable for the following components:   Sodium 133 (*)    CO2 21 (*)    Glucose, Bld 172 (*)    BUN 40 (*)    Creatinine, Ser 1.59 (*)    AST 55 (*)    GFR calc non Af Amer 30 (*)    GFR calc Af Amer 35 (*)    All other components within normal limits  TROPONIN I - Abnormal; Notable for the following components:   Troponin I 0.05 (*)    All other components within normal limits  URINALYSIS, COMPLETE (UACMP) WITH MICROSCOPIC - Abnormal; Notable for the following components:   Color, Urine AMBER (*)    APPearance CLOUDY (*)    Hgb urine dipstick MODERATE (*)    Protein, ur 100 (*)    Leukocytes, UA LARGE (*)    WBC, UA >50 (*)    Bacteria, UA RARE (*)    All other components within normal limits  URINE CULTURE  CBG MONITORING, ED   ____________________________________________  DIFFERENTIAL DIAGNOSIS   Dehydration, electrolyte abnormality, UTI, sepsis, MI, arrhythmia  FINAL ASSESSMENT AND PLAN  Weakness, dehydration, UTI, elevated troponin likely secondary to recent cardiac cath   Plan: The patient had presented for weakness. Patient's labs did indicate significant dehydration with an elevated BUN and creatinine.  He was given IV fluids for same.  She also was found to have UTI, we did send a urine culture and gave her IV antibiotics.  She appears very weak and will need a PT consult she is having a hard time walking, this is likely due to the UTI and dehydration.  I will  discuss with the hospitalist for admission.   Laurence Aly, MD   Note: This note was generated in part or whole with voice recognition software. Voice recognition is usually quite accurate but there are transcription errors that can and very often do occur. I apologize for any typographical errors that were not detected and corrected.     Earleen Newport, MD 02/18/18 1306    Earleen Newport, MD 02/28/18 [phone]

## 2018-02-18 NOTE — ED Notes (Signed)
Pt cleaned up and repositioned in bed at this time 

## 2018-02-18 NOTE — ED Triage Notes (Signed)
Pt to ED via [facility] with c/o weakness. Pt reports that she was going to go to Dr. Raynelle Jan office for follow up of getting two stents last week. EMS report that when they picked her up she was having several PVC's

## 2018-02-18 NOTE — Progress Notes (Signed)
Pharmacy Antibiotic Note  Melinda Maxwell is a 77 y.o. female admitted on 02/18/2018 with UTI.  Pharmacy has been consulted for ceftriaxone dosing.  Plan: Ceftriaxone 1 gm IV Q24H  Height: 5\' 1"  (154.9 cm) Weight: 178 lb (80.7 kg) IBW/kg (Calculated) : 47.8  Temp (24hrs), Avg:100.7 F (38.2 C), Min:98.4 F (36.9 C), Max:103.1 F (39.5 C)  Recent Labs  Lab 02/11/18 1542 02/12/18 0528 02/18/18 1058  WBC  --  6.3 7.0  CREATININE 0.99 0.99 1.59*    Estimated Creatinine Clearance: 27.6 mL/min (A) (by C-G formula based on SCr of 1.59 mg/dL (H)).    Allergies  Allergen Reactions  . Benzocaine Other (See Comments)    Unknown  . Lisinopril Cough  . Neomycin Other (See Comments)    On allergy testing  . Sulfamethoxazole-Trimethoprim Other (See Comments)    Unknown  . Sulfa Antibiotics Rash    Antimicrobials this admission:   Dose adjustments this admission:   Microbiology results:  BCx:   UCx:    Sputum:    MRSA PCR:   Thank you for allowing pharmacy to be a part of this patient's care.  Laural Benes, Pharm.D., BCPS Clinical Pharmacist 02/18/2018 3:36 PM

## 2018-02-19 LAB — TROPONIN I: TROPONIN I: 0.3 ng/mL — AB (ref <0.03)

## 2018-02-19 MED ORDER — SIMVASTATIN 20 MG PO TABS
40.0000 mg | ORAL_TABLET | Freq: Every day | ORAL | Status: DC
  Administered 2018-02-19 – 2018-02-24 (×6): 40 mg via ORAL
  Filled 2018-02-19 (×5): qty 2

## 2018-02-19 MED ORDER — METOPROLOL TARTRATE 50 MG PO TABS
50.0000 mg | ORAL_TABLET | Freq: Once | ORAL | Status: AC
  Administered 2018-02-19: 50 mg via ORAL
  Filled 2018-02-19: qty 1

## 2018-02-19 MED ORDER — METOPROLOL TARTRATE 50 MG PO TABS
100.0000 mg | ORAL_TABLET | Freq: Two times a day (BID) | ORAL | Status: DC
  Administered 2018-02-19 – 2018-02-25 (×14): 100 mg via ORAL
  Filled 2018-02-19 (×14): qty 2

## 2018-02-19 NOTE — Progress Notes (Signed)
[facility] [place] NAME: Melinda Maxwell    MR#:  [id number]  DATE OF BIRTH:  1940/11/13  SUBJECTIVE: Seen at bedside, admitted yesterday for generalized weakness, found to have UTI.  Patient had 2 stents placed a week ago by [name].  CHIEF COMPLAINT:   Chief Complaint  Patient presents with  . Weakness   says she feels much better than yesterday. Patient has no chest pain. REVIEW OF SYSTEMS:   ROS CONSTITUTIONAL: Generalized weakness. EYES: No blurred or double vision.  EARS, NOSE, AND THROAT: No tinnitus or ear pain.  RESPIRATORY: No cough, shortness of breath, wheezing or hemoptysis.  CARDIOVASCULAR: No chest pain, orthopnea, edema.  GASTROINTESTINAL: No nausea, vomiting, diarrhea or abdominal pain.  GENITOURINARY: No dysuria, hematuria.  ENDOCRINE: No polyuria, nocturia,  HEMATOLOGY: No anemia, easy bruising or bleeding SKIN: No rash or lesion. MUSCULOSKELETAL: No joint pain or arthritis.   NEUROLOGIC: No tingling, numbness, weakness.  PSYCHIATRY: No anxiety or depression.   DRUG ALLERGIES:   Allergies  Allergen Reactions  . Benzocaine Other (See Comments)    Unknown  . Lisinopril Cough  . Neomycin Other (See Comments)    On allergy testing  . Sulfamethoxazole-Trimethoprim Other (See Comments)    Unknown  . Sulfa Antibiotics Rash    VITALS:  Blood pressure 140/75, pulse 80, temperature 99.3 F (37.4 C), temperature source Oral, resp. rate 18, height 5\' 1"  (1.549 m), weight 80.3 kg (177 lb), SpO2 92 %.  PHYSICAL EXAMINATION:  GENERAL:  77 y.o.-year-old patient lying in the bed with no acute distress.  EYES: Pupils equal, round, reactive to light and accommodation. No scleral icterus. Extraocular muscles intact.  HEENT: Head atraumatic, normocephalic. Oropharynx and nasopharynx clear.  NECK:  Supple, no jugular venous distention. No thyroid enlargement, no tenderness.  LUNGS: Normal breath sounds  bilaterally, no wheezing, rales,rhonchi or crepitation. No use of accessory muscles of respiration.  CARDIOVASCULAR: S1, S2 normal. No murmurs, rubs, or gallops.  ABDOMEN: Soft, nontender, nondistended. Bowel sounds present. No organomegaly or mass.  EXTREMITIES: No pedal edema, cyanosis, or clubbing.  NEUROLOGIC: Cranial nerves II through XII are intact. Muscle strength 5/5 in all extremities. Sensation intact. Gait not checked.  PSYCHIATRIC: The patient is alert and oriented x 3.  SKIN: No obvious rash, lesion, or ulcer.    LABORATORY PANEL:   CBC Recent Labs  Lab 02/18/18 1058  WBC 7.0  HGB 10.7*  HCT 31.7*  PLT 201   ------------------------------------------------------------------------------------------------------------------  Chemistries  Recent Labs  Lab 02/18/18 1058  NA 133*  K 3.9  CL 102  CO2 21*  GLUCOSE 172*  BUN 40*  CREATININE 1.59*  CALCIUM 9.4  AST 55*  ALT 21  ALKPHOS 94  BILITOT 1.2   ------------------------------------------------------------------------------------------------------------------  Cardiac Enzymes Recent Labs  Lab 02/19/18 0358  TROPONINI 0.30*   ------------------------------------------------------------------------------------------------------------------  RADIOLOGY:  No results found.  EKG:   Orders placed or performed during the hospital encounter of 02/18/18  . ED EKG  . ED EKG  . EKG 12-Lead  . EKG 12-Lead  . EKG 12-Lead  . EKG 12-Lead    ASSESSMENT AND PLAN:  77 year old female patient with history of recent PCI with 2 drug-eluting stents in mid LAD last week comes in with generalized weakness, found to have UTI. #1 generalized weakness secondary to UTI: Patient really felt very weak and could not get up from her chair yesterday, patient supposed to see [name] and get ready for  appointment but she could not get up from chair when the family arrived she was very weak and they called ambulance.   Continue IV fluids today also. 2.  Acute kidney injury secondary to dehydration: Continue IV fluids, avoid nephrotoxic agents. 3.  UTI: Follow urine cultures, continue IV Rocephin. #4 history of CAD status post PCI and drug-eluting stent recently 1 week ago, now has slightly elevated troponins.  Patient has no chest pain.  Likely secondary to demand ischemia continue aspirin, beta-blockers, Plavix, stattins cardiology consult placed with [name].  Physical therapy is on hold pending cardiology clearance. All the records are reviewed and case discussed with Care Management/Social Workerr. Management plans discussed with the patient, family and they are in agreement.  CODE STATUS:full  TOTAL TIME TAKING CARE OF THIS PATIENT: 35 minutes.   POSSIBLE D/C IN 1-2DAYS, DEPENDING ON CLINICAL CONDITION.  More than 50% of time spent in counseling, coordination of care. Epifanio Lesches M.D on 02/19/2018 at 11:17 AM  Between 7am to 6pm - Pager - [phone]  After 6pm go to [email or web address] - password [email or web address] [facility]  [facility]  Office  [phone]  CC: Primary care physician; [name], MD   Note: This dictation was prepared with Dragon dictation along with smaller phrase technology. Any transcriptional errors that result from this process are unintentional.

## 2018-02-19 NOTE — Progress Notes (Signed)
PT Cancellation Note  Patient Details Name: Melinda Maxwell MRN: [id number] DOB: 06-03-1941   Cancelled Treatment:    Reason Eval/Treat Not Completed: Medical issues which prohibited therapy.  Pt with elevated and rising troponin.  Cardiac consult pending.  Will hold PT until pt medically clear for exertional activity.   Collie Siad PT, DPT 02/19/2018, 8:14 AM

## 2018-02-19 NOTE — Progress Notes (Signed)
Patient surgery was noted to be 140-150.  She appears hemodynamically stable.  We will give an extra 50 mg of p.o. metoprolol tartrate and increase her twice daily dose to 100 twice daily and follow heart rate.  Patient feels fluttering in her chest.  She denies significant chest pain at present.  On exam at this time, her heart rate is bouncing between low 100s and mid 120s.

## 2018-02-19 NOTE — Consult Note (Signed)
Cardiology Consultation Note    [name], MRN: [id number], DOB/AGE: December 16, 1940 77 y.o. Admit date: 02/18/2018   Date of Consult: 02/19/2018 Primary Physician: [name], MD Primary Cardiologist: [name]  Chief Complaint: weakness and uti Reason for Consultation: elevated troponin Requesting MD: Dr. Sissy Hoff  HPI: Melinda Maxwell is a 77 y.o. female with history of cad s/p cpi of lad in 02/11/18 with history of hypertension who was admitted after presenting to the er with weakness and fatigue. She denied any chest pain. She had a low grade fever. EKG showd nsr with no ischemia. Her serum troponin is mildly elevated. Was 0.07 on admission. Noted to have greater than 100,000 e coli in urine. Weakness has improved with abx and fluids. Again no chest pain or symptoms similar to her angina.   Past Medical History:  Diagnosis Date  . Breast cancer ([place])   . Hypertension   . MI, old       Surgical History:  Past Surgical History:  Procedure Laterality Date  . ABDOMINAL HYSTERECTOMY    . APPENDECTOMY    . BREAST LUMPECTOMY     L breast  . CORONARY STENT INTERVENTION N/A 02/11/2018   Procedure: CORONARY STENT INTERVENTION;  Surgeon: Nelva Bush, MD;  Location: [facility];  Service: Cardiovascular;  Laterality: N/A;  . LEFT HEART CATH AND CORONARY ANGIOGRAPHY Left 02/11/2018   Procedure: LEFT HEART CATH AND CORONARY ANGIOGRAPHY;  Surgeon: Corey Skains, MD;  Location: [facility];  Service: Cardiovascular;  Laterality: Left;     Home Meds: Prior to Admission medications   Medication Sig Start Date End Date Taking? Authorizing Provider  allopurinol (ZYLOPRIM) 300 MG tablet Take 300 mg by mouth daily.   Yes [provider]  aspirin EC 81 MG tablet Take 81 mg by mouth daily.   Yes [provider]  clopidogrel (PLAVIX) 75 MG tablet Take 1 tablet (75 mg total) by mouth daily with breakfast. 02/12/18  Yes Corey Skains,  MD  isosorbide mononitrate (IMDUR) 30 MG 24 hr tablet Take 30 mg by mouth daily. 01/14/18  Yes [provider]  losartan (COZAAR) 25 MG tablet Take 25 mg by mouth daily. 02/13/18  Yes [provider]  metoprolol tartrate (LOPRESSOR) 50 MG tablet Take 50 mg by mouth 2 (two) times daily. 09/03/16  Yes [provider]  nystatin (MYCOSTATIN/NYSTOP) powder Apply 1 g topically daily as needed.  01/30/18  Yes [provider]  pantoprazole (PROTONIX) 40 MG tablet Take 40 mg by mouth daily. 12/31/17  Yes [provider]  simvastatin (ZOCOR) 40 MG tablet Take 40 mg by mouth daily.   Yes [provider]  spironolactone (ALDACTONE) 25 MG tablet Take 25 mg by mouth daily. 01/31/18  Yes [provider]  Naphazoline-Pheniramine (ALLERGY EYE OP) Place 1 drop into both eyes daily as needed (for dry eyes).    [provider]  nitroGLYCERIN (NITROSTAT) 0.4 MG SL tablet Place 0.4 mg under the tongue every 5 (five) minutes as needed for chest pain.  12/31/17   [provider]  white petrolatum (VASELINE) GEL Apply 1 application topically as needed (for eyes and face).    [provider]    Inpatient Medications:  . aspirin EC  81 mg Oral Daily  . clopidogrel  75 mg Oral Q breakfast  . docusate sodium  100 mg Oral BID  . enoxaparin (LOVENOX) injection  30 mg Subcutaneous Q24H  .  fluconazole  100 mg Oral Daily  . isosorbide mononitrate  30 mg Oral Daily  . metoprolol tartrate  50 mg Oral BID  . nystatin   Topical TID  . pantoprazole  40 mg Oral Daily  . simvastatin  40 mg Oral q1800   . sodium chloride 100 mL/hr at 02/18/18 1652  . cefTRIAXone (ROCEPHIN)  IV Stopped (02/19/18 1002)    Allergies:  Allergies  Allergen Reactions  . Benzocaine Other (See Comments)    Unknown  . Lisinopril Cough  . Neomycin Other (See Comments)    On allergy testing  . Sulfamethoxazole-Trimethoprim Other (See Comments)    Unknown  . Sulfa  Antibiotics Rash    Social History   Socioeconomic History  . Marital status: Divorced    Spouse name: Not on file  . Number of children: Not on file  . Years of education: Not on file  . Highest education level: Not on file  Occupational History  . Not on file  Social Needs  . Financial resource strain: Not on file  . Food insecurity:    Worry: Not on file    Inability: Not on file  . Transportation needs:    Medical: Not on file    Non-medical: Not on file  Tobacco Use  . Smoking status: Never Smoker  . Smokeless tobacco: Never Used  Substance and Sexual Activity  . Alcohol use: No  . Drug use: Never  . Sexual activity: Not on file  Lifestyle  . Physical activity:    Days per week: Not on file    Minutes per session: Not on file  . Stress: Not on file  Relationships  . Social connections:    Talks on phone: Not on file    Gets together: Not on file    Attends religious service: Not on file    Active member of club or organization: Not on file    Attends meetings of clubs or organizations: Not on file    Relationship status: Not on file  . Intimate partner violence:    Fear of current or ex partner: Not on file    Emotionally abused: Not on file    Physically abused: Not on file    Forced sexual activity: Not on file  Other Topics Concern  . Not on file  Social History Narrative  . Not on file     History reviewed. No pertinent family history.   Review of Systems: A 12-system review of systems was performed and is negative except as noted in the HPI.  Labs: Recent Labs    02/18/18 1058 02/18/18 1619 02/18/18 2214 02/19/18 0358  TROPONINI 0.05* 0.07* 0.22* 0.30*   Lab Results  Component Value Date   WBC 7.0 02/18/2018   HGB 10.7 (L) 02/18/2018   HCT 31.7 (L) 02/18/2018   MCV 91.5 02/18/2018   PLT 201 02/18/2018    Recent Labs  Lab 02/18/18 1058  NA 133*  K 3.9  CL 102  CO2 21*  BUN 40*  CREATININE 1.59*  CALCIUM 9.4  PROT 7.6   BILITOT 1.2  ALKPHOS 94  ALT 21  AST 55*  GLUCOSE 172*   No results found for: CHOL, HDL, LDLCALC, TRIG No results found for: DDIMER  Radiology/Studies:  No results found.  Wt Readings from Last 3 Encounters:  02/18/18 80.3 kg (177 lb)  02/11/18 80.7 kg (178 lb)  01/21/16 88.9 kg (196 lb)    EKG: nsr with pvcs.  Physical Exam:  Blood pressure (!) 163/67, pulse 75, temperature 100.3 F (37.9 C), temperature source Oral, resp. rate (!) 24, height 5\' 1"  (1.549 m), weight 80.3 kg (177 lb), SpO2 95 %. Body mass index is 33.44 kg/m. General: Well developed, well nourished, in no acute distress. Head: Normocephalic, atraumatic, sclera non-icteric, no xanthomas, nares are without discharge.  Neck: Negative for carotid bruits. JVD not elevated. Lungs: Clear bilaterally to auscultation without wheezes, rales, or rhonchi. Breathing is unlabored. Heart: RRR with S1 S2. No murmurs, rubs, or gallops appreciated. Abdomen: Soft, non-tender, non-distended with normoactive bowel sounds. No hepatomegaly. No rebound/guarding. No obvious abdominal masses. Msk:  Strength and tone appear normal for age. Extremities: No clubbing or cyanosis. No edema.  Distal pedal pulses are 2+ and equal bilaterally. Neuro: Alert and oriented X 3. No facial asymmetry. No focal deficit. Moves all extremities spontaneously. Psych:  Responds to questions appropriately with a normal affect.     Assessment and Plan  77 yo female with history of cad s/p pci of lad earlier this month now admitted with weakness and fatigue and noted to have uti who has mildy elevated troponin. Does not appear to be actively ishemic. Elevated trponin is likely demand secondary to uti. Would defer invasive evaluaton and continue to treat uti.   Signed, Teodoro Spray MD 02/19/2018, 3:13 PM Pager: [phone]

## 2018-02-19 NOTE — Progress Notes (Signed)
   02/19/18 1400  Clinical Encounter Type  Visited With Patient  Visit Type Initial  Referral From Nurse  Consult/Referral To Chaplain  Spiritual Encounters  Spiritual Needs Prayer;Emotional   Melinda Maxwell received request to pray with PT. Melinda Maxwell reported to PT's RM and found PT awake and appeared to be in a good mood. Melinda Maxwell listened to PT needs and prayed with PT. Melinda Maxwell will follow up as needed.

## 2018-02-20 LAB — URINE CULTURE: SPECIAL REQUESTS: NORMAL

## 2018-02-20 LAB — BASIC METABOLIC PANEL
Anion gap: 8 (ref 5–15)
BUN: 27 mg/dL — AB (ref 6–20)
CHLORIDE: 109 mmol/L (ref 101–111)
CO2: 19 mmol/L — AB (ref 22–32)
Calcium: 9 mg/dL (ref 8.9–10.3)
Creatinine, Ser: 1.02 mg/dL — ABNORMAL HIGH (ref 0.44–1.00)
GFR calc Af Amer: 59 mL/min — ABNORMAL LOW (ref >60)
GFR calc non Af Amer: 51 mL/min — ABNORMAL LOW (ref >60)
GLUCOSE: 123 mg/dL — AB (ref 65–99)
POTASSIUM: 3.9 mmol/L (ref 3.5–5.1)
SODIUM: 136 mmol/L (ref 135–145)

## 2018-02-20 LAB — CBC
HCT: 28.4 % — ABNORMAL LOW (ref 35.0–47.0)
Hemoglobin: 9.4 g/dL — ABNORMAL LOW (ref 12.0–16.0)
MCH: 30.3 pg (ref 26.0–34.0)
MCHC: 33.1 g/dL (ref 32.0–36.0)
MCV: 91.5 fL (ref 80.0–100.0)
Platelets: 181 10*3/uL (ref 150–440)
RBC: 3.1 MIL/uL — ABNORMAL LOW (ref 3.80–5.20)
RDW: 15.5 % — AB (ref 11.5–14.5)
WBC: 7.1 10*3/uL (ref 3.6–11.0)

## 2018-02-20 LAB — HEPARIN LEVEL (UNFRACTIONATED): Heparin Unfractionated: 0.19 IU/mL — ABNORMAL LOW (ref 0.30–0.70)

## 2018-02-20 LAB — PROTIME-INR
INR: 1.24
Prothrombin Time: 15.5 seconds — ABNORMAL HIGH (ref 11.4–15.2)

## 2018-02-20 LAB — APTT: APTT: 37 s — AB (ref 24–36)

## 2018-02-20 MED ORDER — METOPROLOL TARTRATE 5 MG/5ML IV SOLN
5.0000 mg | INTRAVENOUS | Status: DC | PRN
  Administered 2018-02-20 – 2018-02-22 (×2): 5 mg via INTRAVENOUS
  Filled 2018-02-20 (×2): qty 5

## 2018-02-20 MED ORDER — HEPARIN BOLUS VIA INFUSION
3300.0000 [IU] | Freq: Once | INTRAVENOUS | Status: AC
  Administered 2018-02-20: 3300 [IU] via INTRAVENOUS
  Filled 2018-02-20: qty 3300

## 2018-02-20 MED ORDER — HEPARIN BOLUS VIA INFUSION
2000.0000 [IU] | Freq: Once | INTRAVENOUS | Status: AC
  Administered 2018-02-20: 2000 [IU] via INTRAVENOUS
  Filled 2018-02-20: qty 2000

## 2018-02-20 MED ORDER — HEPARIN (PORCINE) IN NACL 100-0.45 UNIT/ML-% IJ SOLN
1200.0000 [IU]/h | INTRAMUSCULAR | Status: DC
  Administered 2018-02-20: 1000 [IU]/h via INTRAVENOUS
  Administered 2018-02-21: 1200 [IU]/h via INTRAVENOUS
  Filled 2018-02-20 (×3): qty 250

## 2018-02-20 MED ORDER — ENOXAPARIN SODIUM 40 MG/0.4ML ~~LOC~~ SOLN: 40.0000 mg | SUBCUTANEOUS | Status: DC

## 2018-02-20 MED ORDER — CEPHALEXIN 500 MG PO CAPS
500.0000 mg | ORAL_CAPSULE | Freq: Two times a day (BID) | ORAL | Status: AC
  Administered 2018-02-20 – 2018-02-25 (×10): 500 mg via ORAL
  Filled 2018-02-20 (×10): qty 1

## 2018-02-20 MED ORDER — HEPARIN (PORCINE) IN NACL 100-0.45 UNIT/ML-% IJ SOLN: 950.0000 [IU]/h | INTRAMUSCULAR | Status: DC

## 2018-02-20 MED ORDER — LEVALBUTEROL HCL 1.25 MG/0.5ML IN NEBU
1.2500 mg | INHALATION_SOLUTION | Freq: Four times a day (QID) | RESPIRATORY_TRACT | Status: DC | PRN
  Administered 2018-02-24: 1.25 mg via RESPIRATORY_TRACT
  Filled 2018-02-20: qty 0.5

## 2018-02-20 MED ORDER — DILTIAZEM HCL 30 MG PO TABS
30.0000 mg | ORAL_TABLET | Freq: Four times a day (QID) | ORAL | Status: DC
  Administered 2018-02-20 (×3): 30 mg via ORAL
  Filled 2018-02-20 (×3): qty 1

## 2018-02-20 NOTE — Progress Notes (Addendum)
Talked to [name] about patient's HR still fluctuating to 110's-150's, MD added Cardizem 30 mg p.o, asked MD what would be the goal for this patient and per MD as long as patient is not sustaining above 120's that would be the goal. No other concern at the moment. RN will continue to monitor.

## 2018-02-20 NOTE — Progress Notes (Signed)
ANTICOAGULATION CONSULT NOTE - Initial Consult   Pharmacy Consult for heparin Indication: atrial fibrillation  Allergies  Allergen Reactions  . Benzocaine Other (See Comments)    Unknown  . Lisinopril Cough  . Neomycin Other (See Comments)    On allergy testing  . Sulfamethoxazole-Trimethoprim Other (See Comments)    Unknown  . Sulfa Antibiotics Rash    Patient Measurements: Height: 5\' 1"  (154.9 cm) Weight: 177 lb (80.3 kg) IBW/kg (Calculated) : 47.8 Heparin Dosing Weight: 66kg  Vital Signs: Temp: 99.8 F (37.7 C) (05/16 2005) Temp Source: Oral (05/16 2005) BP: 132/62 (05/16 2005) Pulse Rate: 103 (05/16 2005)  Labs: Recent Labs    02/18/18 1058 02/18/18 1619 02/18/18 2214 02/19/18 0358 02/20/18 0341 02/20/18 0953 02/20/18 2042  HGB 10.7*  --   --   --   --  9.4*  --   HCT 31.7*  --   --   --   --  28.4*  --   PLT 201  --   --   --   --  181  --   APTT  --   --   --   --   --  37*  --   LABPROT  --   --   --   --   --  15.5*  --   INR  --   --   --   --   --  1.24  --   HEPARINUNFRC  --   --   --   --   --   --  0.19*  CREATININE 1.59*  --   --   --  1.02*  --   --   TROPONINI 0.05* 0.07* 0.22* 0.30*  --   --   --     Estimated Creatinine Clearance: 42.9 mL/min (A) (by C-G formula based on SCr of 1.02 mg/dL (H)).   Medical History: Past Medical History:  Diagnosis Date  . Breast cancer ([place])   . Hypertension   . MI, old     Medications:  Scheduled:  . aspirin EC  81 mg Oral Daily  . cephALEXin  500 mg Oral Q12H  . clopidogrel  75 mg Oral Q breakfast  . diltiazem  30 mg Oral QID  . docusate sodium  100 mg Oral BID  . fluconazole  100 mg Oral Daily  . heparin  2,000 Units Intravenous Once  . isosorbide mononitrate  30 mg Oral Daily  . metoprolol tartrate  100 mg Oral BID  . nystatin   Topical TID  . pantoprazole  40 mg Oral Daily  . simvastatin  40 mg Oral q1800   Infusions:  . heparin 1,000 Units/hr (02/20/18 1135)   PRN: acetaminophen  **OR** acetaminophen, levalbuterol, metoprolol tartrate, naphazoline-pheniramine, nitroGLYCERIN, nystatin, ondansetron **OR** ondansetron (ZOFRAN) IV, white petrolatum  Assessment: New onset afib Goal of Therapy:  Heparin level 0.3-0.7 units/ml Monitor platelets by anticoagulation protocol: Yes   Plan:  Give 3300 units bolus x 1 Start heparin infusion at 1000 units/hr   Given patient age will order an 8 hour heparin level after infusion begins  5/16:  HL @ 20:00 = 0.19 Will order Heparin 2000 units IV X 1 bolus and increase drip rate to 1200 units/hr. Will recheck HL 8 hrs after rate change.   Cherilynn Schomburg D 02/20/2018,10:38 PM

## 2018-02-20 NOTE — Progress Notes (Signed)
MD paged due to irregular heart rate, between 100 to 150's, See new orders Kyla Balzarine, LPN

## 2018-02-20 NOTE — Progress Notes (Signed)
MD paged due to irregular heart rate and rhythm. Awaiting instructions. BP from 148/89 to 142/100 in 15 min, Pulse irregular 100s-170s, Afib and PVCs.  Kyla Balzarine, LPN

## 2018-02-20 NOTE — Progress Notes (Signed)
[place] [facility] [place] NAME: Melinda Maxwell    MR#:  [id number]  DATE OF BIRTH:  [birth year]  SUBJECTIVE: Patient developed atrial fibrillation last night with heart rate up to 170 bpm.  Received metoprolol 5 mg IV push.  This morning patient is in A. fib with heart rate up to 120 bpm.  She denies any chest pain or palpitations.  She says she did not sleep well and has no appetite and not eating good breakfast.  CHIEF COMPLAINT:   Chief Complaint  Patient presents with  . Weakness  low Grade temperature yesterday. REVIEW OF SYSTEMS:   ROS CONSTITUTIONAL: Generalized weakness. EYES: No blurred or double vision.  EARS, NOSE, AND THROAT: No tinnitus or ear pain.  RESPIRATORY: No cough, shortness of breath, wheezing or hemoptysis.  CARDIOVASCULAR: No chest pain, orthopnea, edema.  GASTROINTESTINAL: No nausea, vomiting, diarrhea or abdominal pain.  GENITOURINARY: No dysuria, hematuria.  ENDOCRINE: No polyuria, nocturia,  HEMATOLOGY: No anemia, easy bruising or bleeding SKIN: No rash or lesion. MUSCULOSKELETAL: No joint pain or arthritis.   NEUROLOGIC: No tingling, numbness, weakness.  PSYCHIATRY: No anxiety or depression.   DRUG ALLERGIES:   Allergies  Allergen Reactions  . Benzocaine Other (See Comments)    Unknown  . Lisinopril Cough  . Neomycin Other (See Comments)    On allergy testing  . Sulfamethoxazole-Trimethoprim Other (See Comments)    Unknown  . Sulfa Antibiotics Rash    VITALS:  Blood pressure (!) 145/83, pulse (!) 128, temperature 100.2 F (37.9 C), temperature source Oral, resp. rate 18, height 5\' 1"  (1.549 m), weight 80.3 kg (177 lb), SpO2 97 %.  PHYSICAL EXAMINATION:  GENERAL:  77 y.o.-year-old patient lying in the bed with no acute distress.  EYES: Pupils equal, round, reactive to light and accommodation. No scleral icterus. Extraocular muscles intact.  HEENT: Head atraumatic, normocephalic. Oropharynx and  nasopharynx clear.  NECK:  Supple, no jugular venous distention. No thyroid enlargement, no tenderness.  LUNGS: Normal breath sounds bilaterally, no wheezing, rales,rhonchi or crepitation. No use of accessory muscles of respiration.  CARDIOVASCULAR: S1, S2 irregularly irregular ,norubs, or gallops.  ABDOMEN: Soft, nontender, nondistended. Bowel sounds present. No organomegaly or mass.  EXTREMITIES: No pedal edema, cyanosis, or clubbing.  NEUROLOGIC: Cranial nerves II through XII are intact. Muscle strength 5/5 in all extremities. Sensation intact. Gait not checked.  PSYCHIATRIC: The patient is alert and oriented x 3.  SKIN: No obvious rash, lesion, or ulcer.    LABORATORY PANEL:   CBC Recent Labs  Lab 02/18/18 1058  WBC 7.0  HGB 10.7*  HCT 31.7*  PLT 201   ------------------------------------------------------------------------------------------------------------------  Chemistries  Recent Labs  Lab 02/18/18 1058 02/20/18 0341  NA 133* 136  K 3.9 3.9  CL 102 109  CO2 21* 19*  GLUCOSE 172* 123*  BUN 40* 27*  CREATININE 1.59* 1.02*  CALCIUM 9.4 9.0  AST 55*  --   ALT 21  --   ALKPHOS 94  --   BILITOT 1.2  --    ------------------------------------------------------------------------------------------------------------------  Cardiac Enzymes Recent Labs  Lab 02/19/18 0358  TROPONINI 0.30*   ------------------------------------------------------------------------------------------------------------------  RADIOLOGY:  No results found.  EKG:   Orders placed or performed during the hospital encounter of 02/18/18  . ED EKG  . ED EKG  . EKG 12-Lead  . EKG 12-Lead  . EKG 12-Lead  . EKG 12-Lead    ASSESSMENT AND PLAN:  77 year old female patient with history of  recent PCI with 2 drug-eluting stents in mid LAD last week comes in with generalized weakness, found to have UTI. #1. generalized weakness secondary to UTI: Patient really felt very weak and could not  get up from her chair yesterday, patient supposed to see [name] and get ready for appointment but she could not get up from chair when the family arrived she was very weak and they called ambulance.   2.  Acute kidney injury secondary to dehydration: improving. kidney function improved from creatinine 1.59-1.02 today.   3.  E. coli UTI: Pansensitive.  Change to Keflex. .  #4 history of CAD status post PCI and drug-eluting stent recently 1 week ago, now has slightly elevated troponins.  Patient has no chest pain.    #5 new onset atrial fibrillation: Likely secondary to UTI, dehydration.  Increase the dose of metoprolol to 100 g twice daily, start on heparin drip, continue aspirin, Plavix secondary to recent PCI with drug-eluting stent.  Spoke with [name].  Transfer the patient to telemetry. All the records are reviewed and case discussed with Care Management/Social Workerr. Management plans discussed with the patient, family and they are in agreement.  CODE STATUS:full  TOTAL TIME TAKING CARE OF THIS PATIENT: 35 minutes.   POSSIBLE D/C IN 1-2DAYS, DEPENDING ON CLINICAL CONDITION.  More than 50% of time spent in counseling, coordination of care. Epifanio Lesches M.D on 02/20/2018 at 8:58 AM  Between 7am to 6pm - Pager - [phone]  After 6pm go to [email or web address] - password [email or web address] [facility]  [facility]  Office  [phone]  CC: Primary care physician; [name], MD   Note: This dictation was prepared with Dragon dictation along with smaller phrase technology. Any transcriptional errors that result from this process are unintentional.

## 2018-02-20 NOTE — Progress Notes (Signed)
Paged Dr. Clayborn Bigness about patient's HR, awaiting MD to call back. RN will continue to monitor.

## 2018-02-20 NOTE — Progress Notes (Signed)
ANTICOAGULATION CONSULT NOTE - Initial Consult   Pharmacy Consult for heparin Indication: atrial fibrillation  Allergies  Allergen Reactions  . Benzocaine Other (See Comments)    Unknown  . Lisinopril Cough  . Neomycin Other (See Comments)    On allergy testing  . Sulfamethoxazole-Trimethoprim Other (See Comments)    Unknown  . Sulfa Antibiotics Rash    Patient Measurements: Height: 5\' 1"  (154.9 cm) Weight: 177 lb (80.3 kg) IBW/kg (Calculated) : 47.8 Heparin Dosing Weight: 66kg  Vital Signs: Temp: 100.2 F (37.9 C) (05/16 0345) Temp Source: Oral (05/16 0345) BP: 145/83 (05/16 0833) Pulse Rate: 128 (05/16 0833)  Labs: Recent Labs    02/18/18 1058 02/18/18 1619 02/18/18 2214 02/19/18 0358 02/20/18 0341  HGB 10.7*  --   --   --   --   HCT 31.7*  --   --   --   --   PLT 201  --   --   --   --   CREATININE 1.59*  --   --   --  1.02*  TROPONINI 0.05* 0.07* 0.22* 0.30*  --     Estimated Creatinine Clearance: 42.9 mL/min (A) (by C-G formula based on SCr of 1.02 mg/dL (H)).   Medical History: Past Medical History:  Diagnosis Date  . Breast cancer ([place])   . Hypertension   . MI, old     Medications:  Scheduled:  . aspirin EC  81 mg Oral Daily  . clopidogrel  75 mg Oral Q breakfast  . docusate sodium  100 mg Oral BID  . fluconazole  100 mg Oral Daily  . heparin  3,300 Units Intravenous Once  . isosorbide mononitrate  30 mg Oral Daily  . metoprolol tartrate  100 mg Oral BID  . nystatin   Topical TID  . pantoprazole  40 mg Oral Daily  . simvastatin  40 mg Oral q1800   Infusions:  . cefTRIAXone (ROCEPHIN)  IV Stopped (02/20/18 0917)  . heparin     PRN: acetaminophen **OR** acetaminophen, metoprolol tartrate, naphazoline-pheniramine, nitroGLYCERIN, nystatin, ondansetron **OR** ondansetron (ZOFRAN) IV, white petrolatum  Assessment: New onset afib Goal of Therapy:  Heparin level 0.3-0.7 units/ml Monitor platelets by anticoagulation protocol: Yes   Plan:   Give 3300 units bolus x 1 Start heparin infusion at 1000 units/hr   Given patient age will order an 8 hour heparin level after infusion begins  Dallie Piles 02/20/2018,9:33 AM

## 2018-02-20 NOTE — Progress Notes (Signed)
PT Cancellation Note  Patient Details Name: Melinda Maxwell MRN: [id number] DOB: 26-Jan-1941   Cancelled Treatment:    Reason Eval/Treat Not Completed: Medical issues which prohibited therapy.  Pt developed a-fib last night with HR up to 170bpm.  Pt started on heparin this morning.  Pt currently being transferred to the telemetry floor.  Will hold PT until pt more medically appropriate.    Collie Siad PT, DPT 02/20/2018, 10:28 AM

## 2018-02-20 NOTE — Progress Notes (Signed)
Talked to Dr. Marcille Blanco about patient's bladder scan for 400. Patient last voided at 1521, she has external catheter. Asked patient if she can get up and try to use the commode, she refused to get up because she is scared to fall because she was weak. Order to straight cath her once. RN will continue to monitor.

## 2018-02-21 LAB — CBC
HEMATOCRIT: 27.1 % — AB (ref 35.0–47.0)
Hemoglobin: 9 g/dL — ABNORMAL LOW (ref 12.0–16.0)
MCH: 30.3 pg (ref 26.0–34.0)
MCHC: 33.4 g/dL (ref 32.0–36.0)
MCV: 90.7 fL (ref 80.0–100.0)
Platelets: 183 10*3/uL (ref 150–440)
RBC: 2.98 MIL/uL — AB (ref 3.80–5.20)
RDW: 15.5 % — AB (ref 11.5–14.5)
WBC: 6.7 10*3/uL (ref 3.6–11.0)

## 2018-02-21 LAB — HEPARIN LEVEL (UNFRACTIONATED): Heparin Unfractionated: 0.68 IU/mL (ref 0.30–0.70)

## 2018-02-21 MED ORDER — RIVAROXABAN 20 MG PO TABS
20.0000 mg | ORAL_TABLET | Freq: Every day | ORAL | Status: DC
  Administered 2018-02-21 – 2018-02-26 (×6): 20 mg via ORAL
  Filled 2018-02-21 (×6): qty 1

## 2018-02-21 MED ORDER — DILTIAZEM HCL ER COATED BEADS 120 MG PO CP24
120.0000 mg | ORAL_CAPSULE | Freq: Every day | ORAL | Status: DC
  Administered 2018-02-21 – 2018-02-23 (×3): 120 mg via ORAL
  Filled 2018-02-21 (×3): qty 1

## 2018-02-21 NOTE — Progress Notes (Signed)
[facility] [place] NAME: Melinda Maxwell    MR#:  [id number]  DATE OF BIRTH:  August 11, 1941  SUBJECTIVE: Feels better today.  Still fluctuating between 79 20-1 30.  She has no chest pain, did not have any complaints.  CHIEF COMPLAINT:   Chief Complaint  Patient presents with  . Weakness   REVIEW OF SYSTEMS:   ROS CONSTITUTIONAL: Generalized weakness. EYES: No blurred or double vision.  EARS, NOSE, AND THROAT: No tinnitus or ear pain.  RESPIRATORY: No cough, shortness of breath, wheezing or hemoptysis.  CARDIOVASCULAR: No chest pain, orthopnea, edema.  GASTROINTESTINAL: No nausea, vomiting, diarrhea or abdominal pain.  GENITOURINARY: No dysuria, hematuria.  ENDOCRINE: No polyuria, nocturia,  HEMATOLOGY: No anemia, easy bruising or bleeding SKIN: No rash or lesion. MUSCULOSKELETAL: No joint pain or arthritis.   NEUROLOGIC: No tingling, numbness, weakness.  PSYCHIATRY: No anxiety or depression.   DRUG ALLERGIES:   Allergies  Allergen Reactions  . Benzocaine Other (See Comments)    Unknown  . Lisinopril Cough  . Neomycin Other (See Comments)    On allergy testing  . Sulfamethoxazole-Trimethoprim Other (See Comments)    Unknown  . Sulfa Antibiotics Rash    VITALS:  Blood pressure (!) 118/95, pulse (!) 141, temperature 98 F (36.7 C), temperature source Oral, resp. rate 18, height 5\' 1"  (1.549 m), weight 80.3 kg (177 lb), SpO2 97 %.  PHYSICAL EXAMINATION:  GENERAL:  77 y.o.-year-old patient lying in the bed with no acute distress.  EYES: Pupils equal, round, reactive to light and accommodation. No scleral icterus. Extraocular muscles intact.  HEENT: Head atraumatic, normocephalic. Oropharynx and nasopharynx clear.  NECK:  Supple, no jugular venous distention. No thyroid enlargement, no tenderness.  LUNGS: Normal breath sounds bilaterally, no wheezing, rales,rhonchi or crepitation. No use of accessory muscles of respiration.   CARDIOVASCULAR: S1, S2 irregularly irregular ,norubs, or gallops.  ABDOMEN: Soft, nontender, nondistended. Bowel sounds present. No organomegaly or mass.  EXTREMITIES: No pedal edema, cyanosis, or clubbing.  NEUROLOGIC: Cranial nerves II through XII are intact. Muscle strength 5/5 in all extremities. Sensation intact. Gait not checked.  PSYCHIATRIC: The patient is alert and oriented x 3.  SKIN: No obvious rash, lesion, or ulcer.    LABORATORY PANEL:   CBC Recent Labs  Lab 02/21/18 0539  WBC 6.7  HGB 9.0*  HCT 27.1*  PLT 183   ------------------------------------------------------------------------------------------------------------------  Chemistries  Recent Labs  Lab 02/18/18 1058 02/20/18 0341  NA 133* 136  K 3.9 3.9  CL 102 109  CO2 21* 19*  GLUCOSE 172* 123*  BUN 40* 27*  CREATININE 1.59* 1.02*  CALCIUM 9.4 9.0  AST 55*  --   ALT 21  --   ALKPHOS 94  --   BILITOT 1.2  --    ------------------------------------------------------------------------------------------------------------------  Cardiac Enzymes Recent Labs  Lab 02/19/18 0358  TROPONINI 0.30*   ------------------------------------------------------------------------------------------------------------------  RADIOLOGY:  No results found.  EKG:   Orders placed or performed during the hospital encounter of 02/18/18  . ED EKG  . ED EKG  . EKG 12-Lead  . EKG 12-Lead  . EKG 12-Lead  . EKG 12-Lead    ASSESSMENT AND PLAN:  77 year old female patient with history of recent PCI with 2 drug-eluting stents in mid LAD last week comes in with generalized weakness, found to have UTI. #1. generalized weakness secondary to UTI: Patient really felt very weak and could not get up from her chair yesterday, patient supposed  to see [name] and get ready for appointment but she could not get up from chair when the family arrived she was very weak and they called ambulance.   2.  Acute kidney injury  secondary to dehydration: improving. kidney function improved from creatinine 1.59-1.02 today.   3.  E. coli UTI: Pansensitive.  Change to Keflex. .  #4 history of CAD status post PCI and drug-eluting stent recently 1 week ago, now has slightly elevated troponins.  Patient has no chest pain.    #5 new onset atrial fibrillation: Likely secondary to UTI, dehydration, spoke with [name] today and to start back on Xarelto.  She will Xarelto for 4 years before and she said that she would start Xarelto.  Discussed risk and benefits of anticoagulation.  Continue beta-blockers, added Cardizem.  on aspirin, Plavix, Xarelto today  secondary to recent PCI with drug-eluting stent.  Spoke with [name].   All the records are reviewed and case discussed with Care Management/Social Workerr. Management plans discussed with the patient, family and they are in agreement.  CODE STATUS:full  TOTAL TIME TAKING CARE OF THIS PATIENT: 35 minutes.   POSSIBLE D/C IN 1-2DAYS, DEPENDING ON CLINICAL CONDITION.  More than 50% of time spent in counseling, coordination of care. Epifanio Lesches M.D on 02/21/2018 at 10:11 AM  Between 7am to 6pm - Pager - [phone]  After 6pm go to [email or web address] - password [email or web address] [facility]  [place] [facility]  Office  [phone]  CC: Primary care physician; [name], MD   Note: This dictation was prepared with Dragon dictation along with smaller phrase technology. Any transcriptional errors that result from this process are unintentional.

## 2018-02-21 NOTE — Progress Notes (Signed)
ANTICOAGULATION CONSULT NOTE - Initial Consult   Pharmacy Consult for heparin Indication: atrial fibrillation  Allergies  Allergen Reactions  . Benzocaine Other (See Comments)    Unknown  . Lisinopril Cough  . Neomycin Other (See Comments)    On allergy testing  . Sulfamethoxazole-Trimethoprim Other (See Comments)    Unknown  . Sulfa Antibiotics Rash    Patient Measurements: Height: 5\' 1"  (154.9 cm) Weight: 177 lb (80.3 kg) IBW/kg (Calculated) : 47.8 Heparin Dosing Weight: 66kg  Vital Signs: Temp: 97.8 F (36.6 C) (05/17 0441) Temp Source: Oral (05/17 0441) BP: 143/70 (05/17 0441) Pulse Rate: 95 (05/17 0441)  Labs: Recent Labs    02/18/18 1058 02/18/18 1619 02/18/18 2214 02/19/18 0358 02/20/18 0341 02/20/18 0953 02/20/18 2042 02/21/18 0539  HGB 10.7*  --   --   --   --  9.4*  --  9.0*  HCT 31.7*  --   --   --   --  28.4*  --  27.1*  PLT 201  --   --   --   --  181  --  183  APTT  --   --   --   --   --  37*  --   --   LABPROT  --   --   --   --   --  15.5*  --   --   INR  --   --   --   --   --  1.24  --   --   HEPARINUNFRC  --   --   --   --   --   --  0.19* 0.68  CREATININE 1.59*  --   --   --  1.02*  --   --   --   TROPONINI 0.05* 0.07* 0.22* 0.30*  --   --   --   --     Estimated Creatinine Clearance: 42.9 mL/min (A) (by C-G formula based on SCr of 1.02 mg/dL (H)).   Medical History: Past Medical History:  Diagnosis Date  . Breast cancer ([place])   . Hypertension   . MI, old     Medications:  Scheduled:  . aspirin EC  81 mg Oral Daily  . cephALEXin  500 mg Oral Q12H  . clopidogrel  75 mg Oral Q breakfast  . diltiazem  30 mg Oral QID  . docusate sodium  100 mg Oral BID  . fluconazole  100 mg Oral Daily  . isosorbide mononitrate  30 mg Oral Daily  . metoprolol tartrate  100 mg Oral BID  . nystatin   Topical TID  . pantoprazole  40 mg Oral Daily  . simvastatin  40 mg Oral q1800   Infusions:  . heparin 1,200 Units/hr (02/21/18 0518)   PRN:  acetaminophen **OR** acetaminophen, levalbuterol, metoprolol tartrate, naphazoline-pheniramine, nitroGLYCERIN, nystatin, ondansetron **OR** ondansetron (ZOFRAN) IV, white petrolatum  Assessment: New onset afib Goal of Therapy:  Heparin level 0.3-0.7 units/ml Monitor platelets by anticoagulation protocol: Yes   Plan:  Give 3300 units bolus x 1 Start heparin infusion at 1000 units/hr   Given patient age will order an 8 hour heparin level after infusion begins  5/16:  HL @ 20:00 = 0.19 Will order Heparin 2000 units IV X 1 bolus and increase drip rate to 1200 units/hr. Will recheck HL 8 hrs after rate change.   05/17 AM heparin level 0.68. Continue current regimen. Recheck in 8 hours to confirm.  Jadarion Halbig S 02/21/2018,7:06 AM

## 2018-02-21 NOTE — Progress Notes (Signed)
PT Cancellation Note  Patient Details Name: Melinda Maxwell MRN: [id number] DOB: 1941/07/13   Cancelled Treatment:    Reason Eval/Treat Not Completed: Medical issues which prohibited therapy.  HR continues to be elevated, up to 140 at rest.  Will hold PT until pt medically appropriate for exertional activity.   Collie Siad PT, DPT 02/21/2018, 2:15 PM

## 2018-02-21 NOTE — Progress Notes (Signed)
PT Cancellation Note  Patient Details Name: Melinda Maxwell MRN: [id number] DOB: 1941-02-13   Cancelled Treatment:    Reason Eval/Treat Not Completed: Medical issues which prohibited therapy.  Pt's HR up as high as 159 at rest.  Will hold PT until pt more medically appropriate for exertional activity.    Collie Siad PT, DPT 02/21/2018, 10:19 AM

## 2018-02-21 NOTE — Progress Notes (Signed)
Patient Name: Melinda Maxwell Date of Encounter: 02/21/2018  Hospital Problem List     Active Problems:   UTI (urinary tract infection)    Patient Profile     77 year old female with history of coronary disease status post PCI of the LAD on 02/11/2018.  Admitted with low-grade fever and noted to have E. coli UTI..  Initially had sinus rhythm with PACs.  This developed atrial fibrillation with variable ventricular response.  Patient was continued with Plavix and placed on IV heparin.  Rate is better controlled on current regimen including diltiazem 30 every 6 as well as metoprolol tartrate 100 mg twice daily.  Subjective   Feels better.  Inpatient Medications    . aspirin EC  81 mg Oral Daily  . cephALEXin  500 mg Oral Q12H  . clopidogrel  75 mg Oral Q breakfast  . diltiazem  30 mg Oral QID  . docusate sodium  100 mg Oral BID  . fluconazole  100 mg Oral Daily  . isosorbide mononitrate  30 mg Oral Daily  . metoprolol tartrate  100 mg Oral BID  . nystatin   Topical TID  . pantoprazole  40 mg Oral Daily  . simvastatin  40 mg Oral q1800    Vital Signs    Vitals:   02/20/18 1042 02/20/18 1614 02/20/18 2005 02/21/18 0441  BP: (!) 134/91 (!) 131/91 132/62 (!) 143/70  Pulse: (!) 152 90 (!) 103 95  Resp: 18 18 18 17   Temp: 98 F (36.7 C) 99.8 F (37.7 C) 99.8 F (37.7 C) 97.8 F (36.6 C)  TempSrc: Oral Oral Oral Oral  SpO2: 98% 90% 97% 98%  Weight:      Height:        Intake/Output Summary (Last 24 hours) at 02/21/2018 0729 Last data filed at 02/21/2018 4097 Gross per 24 hour  Intake 669.03 ml  Output 1050 ml  Net -380.97 ml   Filed Weights   02/18/18 1048 02/18/18 1528  Weight: 80.7 kg (178 lb) 80.3 kg (177 lb)    Physical Exam    GEN: Well nourished, well developed, in no acute distress.  HEENT: normal.  Neck: Supple, no JVD, carotid bruits, or masses. Cardiac: Regular, no murmurs, rubs, or gallops. No clubbing, cyanosis, edema.  Radials/DP/PT 2+ and equal  bilaterally.  Respiratory:  Respirations regular and unlabored, clear to auscultation bilaterally. GI: Soft, nontender, nondistended, BS + x 4. MS: no deformity or atrophy. Skin: warm and dry, no rash. Neuro:  Strength and sensation are intact. Psych: Normal affect.  Labs    CBC Recent Labs    02/18/18 1058 02/20/18 0953 02/21/18 0539  WBC 7.0 7.1 6.7  NEUTROABS 5.9  --   --   HGB 10.7* 9.4* 9.0*  HCT 31.7* 28.4* 27.1*  MCV 91.5 91.5 90.7  PLT 201 181 353   Basic Metabolic Panel Recent Labs    02/18/18 1058 02/20/18 0341  NA 133* 136  K 3.9 3.9  CL 102 109  CO2 21* 19*  GLUCOSE 172* 123*  BUN 40* 27*  CREATININE 1.59* 1.02*  CALCIUM 9.4 9.0   Liver Function Tests Recent Labs    02/18/18 1058  AST 55*  ALT 21  ALKPHOS 94  BILITOT 1.2  PROT 7.6  ALBUMIN 3.5   No results for input(s): LIPASE, AMYLASE in the last 72 hours. Cardiac Enzymes Recent Labs    02/18/18 1619 02/18/18 2214 02/19/18 0358  TROPONINI 0.07* 0.22* 0.30*   BNP No  results for input(s): BNP in the last 72 hours. D-Dimer No results for input(s): DDIMER in the last 72 hours. Hemoglobin A1C No results for input(s): HGBA1C in the last 72 hours. Fasting Lipid Panel No results for input(s): CHOL, HDL, LDLCALC, TRIG, CHOLHDL, LDLDIRECT in the last 72 hours. Thyroid Function Tests No results for input(s): TSH, T4TOTAL, T3FREE, THYROIDAB in the last 72 hours.  Invalid input(s): FREET3  Telemetry    Atrial fibrillation with variable ventricular response controlled.  ECG      Radiology    No results found.  Assessment & Plan    77 year old female with history of coronary disease status post PCI of the LAD earlier this month admitted with UTI.  Developed atrial fibrillation with rapid ventricular response.  Currently on metoprolol tartrate 100 twice daily and Cardizem 30 mg every 6.  She is on IV heparin.  She had been on Xarelto in the past but is concerned about the cost.  Will  need to  place on Xarelto or Eliquis for her atrial fibrillation.  Consideration for discontinuing aspirin after 4 weeks of triple therapy and continue with Plavix and direct acting oral anticoagulant.  We will also convert to long-acting Cardizem and placed on Cardizem CD 120 mg daily.  Continue treatment of her urinary tract infection.  We will ambulate the patient today in follow-up.  Signed, Javier Docker Khadeeja Elden MD 02/21/2018, 7:29 AM  Pager: [phone]

## 2018-02-21 NOTE — Care Management Important Message (Signed)
Important Message  Patient Details  Name: Melinda Maxwell MRN: [id number] Date of Birth: 25-Sep-1941   Medicare Important Message Given:  Yes    Juliann Pulse A Malayna Noori 02/21/2018, 11:15 AM

## 2018-02-22 NOTE — Progress Notes (Signed)
[place] at [place] NAME: Melinda Maxwell    MR#:  [id number]  DATE OF BIRTH:  1941-06-11  SUBJECTIVE: Patient denies any complaints.  CHIEF COMPLAINT:   Chief Complaint  Patient presents with  . Weakness   REVIEW OF SYSTEMS:   ROS CONSTITUTIONAL: Generalized weakness. EYES: No blurred or double vision.  EARS, NOSE, AND THROAT: No tinnitus or ear pain.  RESPIRATORY: No cough, shortness of breath, wheezing or hemoptysis.  CARDIOVASCULAR: No chest pain, orthopnea, edema.  GASTROINTESTINAL: No nausea, vomiting, diarrhea or abdominal pain.  GENITOURINARY: No dysuria, hematuria.  ENDOCRINE: No polyuria, nocturia,  HEMATOLOGY: No anemia, easy bruising or bleeding SKIN: No rash or lesion. MUSCULOSKELETAL: No joint pain or arthritis.   NEUROLOGIC: No tingling, numbness, weakness.  PSYCHIATRY: No anxiety or depression.   DRUG ALLERGIES:   Allergies  Allergen Reactions  . Benzocaine Other (See Comments)    Unknown  . Lisinopril Cough  . Neomycin Other (See Comments)    On allergy testing  . Sulfamethoxazole-Trimethoprim Other (See Comments)    Unknown  . Sulfa Antibiotics Rash    VITALS:  Blood pressure 128/89, pulse (!) 128, temperature 98.1 F (36.7 C), temperature source Oral, resp. rate 17, height 5\' 1"  (1.549 m), weight 80.3 kg (177 lb), SpO2 96 %.  PHYSICAL EXAMINATION:  GENERAL:  77 y.o.-year-old patient lying in the bed with no acute distress.  EYES: Pupils equal, round, reactive to light and accommodation. No scleral icterus. Extraocular muscles intact.  HEENT: Head atraumatic, normocephalic. Oropharynx and nasopharynx clear.  NECK:  Supple, no jugular venous distention. No thyroid enlargement, no tenderness.  LUNGS: Normal breath sounds bilaterally, no wheezing, rales,rhonchi or crepitation. No use of accessory muscles of respiration.  CARDIOVASCULAR: S1, S2 irregularly irregular ,norubs, or gallops.  ABDOMEN: Soft,  nontender, nondistended. Bowel sounds present. No organomegaly or mass.  EXTREMITIES: No pedal edema, cyanosis, or clubbing.  NEUROLOGIC: Cranial nerves II through XII are intact. Muscle strength 5/5 in all extremities. Sensation intact. Gait not checked.  PSYCHIATRIC: The patient is alert and oriented x 3.  SKIN: No obvious rash, lesion, or ulcer.    LABORATORY PANEL:   CBC Recent Labs  Lab 02/21/18 0539  WBC 6.7  HGB 9.0*  HCT 27.1*  PLT 183   ------------------------------------------------------------------------------------------------------------------  Chemistries  Recent Labs  Lab 02/18/18 1058 02/20/18 0341  NA 133* 136  K 3.9 3.9  CL 102 109  CO2 21* 19*  GLUCOSE 172* 123*  BUN 40* 27*  CREATININE 1.59* 1.02*  CALCIUM 9.4 9.0  AST 55*  --   ALT 21  --   ALKPHOS 94  --   BILITOT 1.2  --    ------------------------------------------------------------------------------------------------------------------  Cardiac Enzymes Recent Labs  Lab 02/19/18 0358  TROPONINI 0.30*   ------------------------------------------------------------------------------------------------------------------  RADIOLOGY:  No results found.  EKG:   Orders placed or performed during the hospital encounter of 02/18/18  . ED EKG  . ED EKG  . EKG 12-Lead  . EKG 12-Lead  . EKG 12-Lead  . EKG 12-Lead    ASSESSMENT AND PLAN:  77 year old female patient with history of recent PCI with 2 drug-eluting stents in mid LAD last week comes in with generalized weakness, found to have UTI. #1. generalized weakness secondary to UTI: Patient really felt very weak and could not get up from her chair yesterday, patient supposed to see [name] and get ready for appointment but she could not get up from chair when  the family arrived she was very weak and they called ambulance.   2.  Acute kidney injury secondary to dehydration: improving. kidney function improved from creatinine 1.59-1.02  today.   3.  E. coli UTI: Pansensitive.  Change to Keflex. .  #4 history of CAD status post PCI and drug-eluting stent recently 1 week ago, now has slightly elevated troponins.  Patient has no chest pain.    #5 new onset atrial fibrillation: Likely secondary to UTI, dehydration, spoke with [name] today and to start back on Xarelto.  She will Xarelto for 4 years before and she said that she would start Xarelto.  Discussed risk and benefits of anticoagulation.  Co continue metoprolol, Cardizem CD 120 mg p.o. daily, Xarelto, monitor on telemetry today. He still tachycardic with heart rate up to 140 beats when she gets up with pHYSICAL  therapy. secondary to recent PCI with drug-eluting stent.  Spoke with [name].   All the records are reviewed and case discussed with Care Management/Social Workerr. Management plans discussed with the patient, family and they are in agreement.  CODE STATUS:full  TOTAL TIME TAKING CARE OF THIS PATIENT: 35 minutes.   POSSIBLE D/C IN 1-2DAYS, DEPENDING ON CLINICAL CONDITION.  More than 50% of time spent in counseling, coordination of care. Epifanio Lesches M.D on 02/22/2018 at 1:58 PM  Between 7am to 6pm - Pager - [phone]  After 6pm go to [email or web address] - password [email or web address] [facility]  [place] [facility]  Office  [phone]  CC: Primary care physician; [name], MD   Note: This dictation was prepared with Dragon dictation along with smaller phrase technology. Any transcriptional errors that result from this process are unintentional.

## 2018-02-22 NOTE — Evaluation (Signed)
Physical Therapy Evaluation Patient Details Name: Melinda Maxwell MRN: [id number] DOB: [birth year] Today's Date: 02/22/2018   History of Present Illness  Pt admitted for weakness in B LEs. Pt with history of breast cancer on chemo and radiation, and CAD. Pt also with recent cath and stent placement on 02/11/18. Upon trying to leave her house for MD appointment, unable to get up from chair, severe weakness. Now diagnosed with UTI. Pt with elevated HR throughout hospital stay, however currently is under control and per RN is safe to mobilize with therapy.  Clinical Impression  Pt is a pleasant 77 year old female who was admitted for UTI with complaints of weakness. Pt performs bed mobility with mod assist, transfers with mod assist, and ambulation with min assist and RW. Pt demonstrates deficits with strength/mobility/endurance/balance. High risk for falls, needs hands on assist for all mobility. All mobility performed on RA with sats WNL. HR elevated with exertion, RN notified. Pt is currently not at baseline level at this time. Very fearful of falling. Would benefit from skilled PT to address above deficits and promote optimal return to PLOF; recommend transition to STR upon discharge from acute hospitalization.       Follow Up Recommendations SNF    Equipment Recommendations  Rolling walker with 5" wheels    Recommendations for Other Services       Precautions / Restrictions Precautions Precautions: Fall Restrictions Weight Bearing Restrictions: No      Mobility  Bed Mobility Overal bed mobility: Needs Assistance Bed Mobility: Supine to Sit     Supine to sit: Mod assist     General bed mobility comments: needs assist to slide B LEs off bed and scoot towards EOB. Pt with slight dizziness once seated at EOB, however resolves quickly. All mobiltiy performed on RA with sats WNL  Transfers Overall transfer level: Needs assistance Equipment used: Rolling walker (2 wheeled) Transfers:  Sit to/from Stand Sit to Stand: Mod assist         General transfer comment: needs assist for upright posture with B LE bracing against bed. Heavy post leaning noted with assistance for upright posture. Weight shifts performed and other pre gait activities to improve standing balance. Able to improve to only needed cga.  Ambulation/Gait Ambulation/Gait assistance: Min assist Ambulation Distance (Feet): 3 Feet Assistive device: Rolling walker (2 wheeled) Gait Pattern/deviations: Step-to pattern     General Gait Details: ambulated to recliner with min assist and very cautious stepping noted. Very fearful of falling and short step length noted. HR elevated to 140s with exertion.  Stairs            Wheelchair Mobility    Modified Rankin (Stroke Patients Only)       Balance Overall balance assessment: Needs assistance Sitting-balance support: Feet supported Sitting balance-Leahy Scale: Fair     Standing balance support: Bilateral upper extremity supported Standing balance-Leahy Scale: Poor                               Pertinent Vitals/Pain Pain Assessment: No/denies pain    Home Living Family/patient expects to be discharged to:: Private residence Living Arrangements: Alone   Type of Home: House Home Access: Stairs to enter Entrance Stairs-Rails: None Entrance Stairs-Number of Steps: 2 Home Layout: One level Home Equipment: Cane - single point      Prior Function Level of Independence: Independent with assistive device(s)  Comments: uses SPC in R hand for all mobility. NO falls history     Hand Dominance        Extremity/Trunk Assessment   Upper Extremity Assessment Upper Extremity Assessment: Generalized weakness(B UE grossly 3+/5)    Lower Extremity Assessment Lower Extremity Assessment: Generalized weakness(B LE grossly 3+/5)       Communication   Communication: No difficulties  Cognition Arousal/Alertness:  Awake/alert Behavior During Therapy: WFL for tasks assessed/performed Overall Cognitive Status: Within Functional Limits for tasks assessed                                        General Comments      Exercises Other Exercises Other Exercises: supine ther-ex performed on B LE including ankle pumps, SLRs, hip abd/add, and quad sets. All ther-ex performed x 10 reps with min assist. Other Exercises: Pregait activities performed while standing at EOB including weight shifts, marching in place and side stepping with min assist. Pt very fearful of falling.   Assessment/Plan    PT Assessment Patient needs continued PT services  PT Problem List Decreased strength;Decreased activity tolerance;Decreased balance;Decreased mobility;Cardiopulmonary status limiting activity       PT Treatment Interventions Gait training;DME instruction;Therapeutic exercise;Balance training    PT Goals (Current goals can be found in the Care Plan section)  Acute Rehab PT Goals Patient Stated Goal: to get stronger PT Goal Formulation: With patient Time For Goal Achievement: 03/08/18 Potential to Achieve Goals: Good    Frequency Min 2X/week   Barriers to discharge        Co-evaluation               AM-PAC PT "6 Clicks" Daily Activity  Outcome Measure Difficulty turning over in bed (including adjusting bedclothes, sheets and blankets)?: Unable Difficulty moving from lying on back to sitting on the side of the bed? : Unable Difficulty sitting down on and standing up from a chair with arms (e.g., wheelchair, bedside commode, etc,.)?: Unable Help needed moving to and from a bed to chair (including a wheelchair)?: A Lot Help needed walking in hospital room?: A Lot Help needed climbing 3-5 steps with a railing? : A Lot 6 Click Score: 9    End of Session Equipment Utilized During Treatment: Gait belt Activity Tolerance: Patient tolerated treatment well Patient left: in chair;with  chair alarm set;with family/visitor present Nurse Communication: Mobility status PT Visit Diagnosis: Muscle weakness (generalized) (M62.81);Difficulty in walking, not elsewhere classified (R26.2)    Time: 2549-8264 PT Time Calculation (min) (ACUTE ONLY): 38 min   Charges:   PT Evaluation $PT Eval Low Complexity: 1 Low PT Treatments $Therapeutic Exercise: 8-22 mins $Therapeutic Activity: 8-22 mins   PT G Codes:        Greggory Stallion, PT, DPT [phone]   Brenen Beigel 02/22/2018, 4:16 PM

## 2018-02-23 NOTE — NC FL2 (Signed)
[place] LEVEL OF CARE SCREENING TOOL     IDENTIFICATION  Patient Name: Melinda Maxwell Birthdate: 1940-12-01 Sex: female Admission Date (Current Location): 02/18/2018  [place] and [place] Number:  Engineering geologist and Address:  [facility], [address], [place], [place] [address]      Provider Number: [id number]  Attending Physician Name and Address:  Epifanio Lesches, MD  Relative Name and Phone Number:  Ishia Tenorio (Daughter) [phone]    Current Level of Care: Hospital Recommended Level of Care: [place] Prior Approval Number:    Date Approved/Denied: 02/23/18 PASRR Number: [id number]  Discharge Plan: SNF    Current Diagnoses: Patient Active Problem List   Diagnosis Date Noted  . UTI (urinary tract infection) 02/18/2018  . Stable angina (HCC) 01/15/2018    Orientation RESPIRATION BLADDER Height & Weight     Self, Time, Situation, Place    Continent Weight: 177 lb 14.6 oz (80.7 kg) Height:  5\' 1"  (154.9 cm)  BEHAVIORAL SYMPTOMS/MOOD NEUROLOGICAL BOWEL NUTRITION STATUS      Continent Diet(Heart healthy)  AMBULATORY STATUS COMMUNICATION OF NEEDS Skin   Extensive Assist Verbally Normal                       Personal Care Assistance Level of Assistance  Bathing, Feeding, Dressing Bathing Assistance: Limited assistance Feeding assistance: Independent Dressing Assistance: Limited assistance     Functional Limitations Info  Sight, Hearing, Speech Sight Info: Adequate Hearing Info: Adequate Speech Info: Adequate    SPECIAL CARE FACTORS FREQUENCY  PT (By licensed PT)     PT Frequency: 5X per week              Contractures Contractures Info: Not present    Additional Factors Info  Code Status, Allergies Code Status Info: Full Allergies Info: Benzocaine, Lisinopril, Neomycin, Sulfamethoxazole-trimethoprim, Sulfa Antibiotics           Current Medications (02/23/2018):   This is the current hospital active medication list Current Facility-Administered Medications  Medication Dose Route Frequency Provider Last Rate Last Dose  . acetaminophen (TYLENOL) tablet 650 mg  650 mg Oral Q6H PRN Gouru, Aruna, MD   650 mg at 02/20/18 2106   Or  . acetaminophen (TYLENOL) suppository 650 mg  650 mg Rectal Q6H PRN Gouru, Aruna, MD      . aspirin EC tablet 81 mg  81 mg Oral Daily Gouru, Aruna, MD   81 mg at 02/23/18 1137  . cephALEXin (KEFLEX) capsule 500 mg  500 mg Oral Q12H Epifanio Lesches, MD   500 mg at 02/23/18 1138  . clopidogrel (PLAVIX) tablet 75 mg  75 mg Oral Q breakfast Gouru, Aruna, MD   75 mg at 02/23/18 1142  . diltiazem (CARDIZEM CD) 24 hr capsule 120 mg  120 mg Oral Daily Teodoro Spray, MD   120 mg at 02/23/18 1137  . docusate sodium (COLACE) capsule 100 mg  100 mg Oral BID Gouru, Aruna, MD   100 mg at 02/23/18 1137  . fluconazole (DIFLUCAN) tablet 100 mg  100 mg Oral Daily Gouru, Aruna, MD   100 mg at 02/23/18 1139  . isosorbide mononitrate (IMDUR) 24 hr tablet 30 mg  30 mg Oral Daily Gouru, Aruna, MD   30 mg at 02/23/18 1138  . levalbuterol (XOPENEX) nebulizer solution 1.25 mg  1.25 mg Nebulization Q6H PRN Harrie Foreman, MD      . metoprolol tartrate (LOPRESSOR) injection 5  mg  5 mg Intravenous Q4H PRN Harrie Foreman, MD   5 mg at 02/22/18 2346  . metoprolol tartrate (LOPRESSOR) tablet 100 mg  100 mg Oral BID Teodoro Spray, MD   100 mg at 02/23/18 1138  . naphazoline-pheniramine (NAPHCON-A) 0.025-0.3 % ophthalmic solution 1 drop  1 drop Both Eyes Daily PRN Gouru, Aruna, MD      . nitroGLYCERIN (NITROSTAT) SL tablet 0.4 mg  0.4 mg Sublingual Q5 min PRN Gouru, Aruna, MD      . nystatin (MYCOSTATIN/NYSTOP) topical powder 1 g  1 g Topical Daily PRN Gouru, Aruna, MD      . nystatin (MYCOSTATIN/NYSTOP) topical powder   Topical TID Gouru, Aruna, MD      . ondansetron (ZOFRAN) tablet 4 mg  4 mg Oral Q6H PRN Gouru, Aruna, MD       Or  . ondansetron  (ZOFRAN) injection 4 mg  4 mg Intravenous Q6H PRN Gouru, Aruna, MD      . pantoprazole (PROTONIX) EC tablet 40 mg  40 mg Oral Daily Gouru, Aruna, MD   40 mg at 02/23/18 1138  . rivaroxaban (XARELTO) tablet 20 mg  20 mg Oral Daily Teodoro Spray, MD   20 mg at 02/23/18 1138  . simvastatin (ZOCOR) tablet 40 mg  40 mg Oral q1800 Epifanio Lesches, MD   40 mg at 02/22/18 1746  . Leul Narramore petrolatum (VASELINE) gel 1 application  1 application Topical PRN Gouru, Aruna, MD         Discharge Medications: Please see discharge summary for a list of discharge medications.  Relevant Imaging Results:  Relevant Lab Results:   Additional Information SS# [id number]  Zettie Pho, LCSW

## 2018-02-23 NOTE — Clinical Social Work Note (Signed)
Clinical Social Work Assessment  Patient Details  Name: Melinda Maxwell MRN: [id number] Date of Birth: 23-Apr-1941  Date of referral:  02/23/18               Reason for consult:  Facility Placement                Permission sought to share information with:  Chartered certified accountant granted to share information::  Yes, Verbal Permission Granted  Name::        Agency::  [facility] area SNFs  Relationship::     Contact Information:     Housing/Transportation Living arrangements for the past 2 months:  [place] of Information:  Patient, Medical Team Patient Interpreter Needed:  None, [place] Criminal Activity/Legal Involvement Pertinent to Current Situation/Hospitalization:    Significant Relationships:  Adult Children, Siblings Lives with:  Self Do you feel safe going back to the place where you live?  Yes Need for family participation in patient care:  No (Coment)  Care giving concerns:  PT recommendation for SNF   Social Worker assessment / plan:  The CSW met with the patient at bedside to discuss discharge planning. The CSW introduced herself and her role in care. The patient verbalized permission to begin a SNF referral and named [facility] as her preference. The CSW explained the need for prior authorization from [facility], and the patient stated that she understood and had no questions.   The patient will most likely discharge in 1-2 days. The CSW will provide bed offers as they become available and follow to facilitate discharge.  Employment status:  Retired Nurse, adult PT Recommendations:  [place] / Referral to community resources:  [place]  Patient/Family's Response to care:  The patient thanked the CSW.  Patient/Family's Understanding of and Emotional Response to Diagnosis, Current Treatment, and Prognosis: The patient understands and agrees with the discharge  plan.  Emotional Assessment Appearance:  Appears younger than stated age Attitude/Demeanor/Rapport:  Self-Confident, Engaged, Gracious Affect (typically observed):  Accepting, Appropriate, Pleasant Orientation:  Oriented to Self, Oriented to Place, Oriented to  Time, Oriented to Situation Alcohol / Substance use:  Never Used Psych involvement (Current and /or in the community):  No (Comment)  Discharge Needs  Concerns to be addressed:  Discharge Planning Concerns, Care Coordination Readmission within the last 30 days:  Yes Current discharge risk:  Lives alone Barriers to Discharge:  Continued Medical Work up   [employer], LCSW 02/23/2018, 3:39 PM

## 2018-02-23 NOTE — Progress Notes (Signed)
[place] at [place] NAME: Naveh Rickles    MR#:  [id number]  DATE OF BIRTH:  05-Nov-1940  SUBJECTIVE: Patient denies any complaints. Heart rate up to 170 beats last night so received metoprolol 5 mg IV push.  This morning heart rate is stable but according to nurse heart rate still goes up 120  when she walks with physical therapy  CHIEF COMPLAINT:   Chief Complaint  Patient presents with  . Weakness   REVIEW OF SYSTEMS:   ROS CONSTITUTIONAL: Generalized weakness. EYES: No blurred or double vision.  EARS, NOSE, AND THROAT: No tinnitus or ear pain.  RESPIRATORY: No cough, shortness of breath, wheezing or hemoptysis.  CARDIOVASCULAR: No chest pain, orthopnea, edema.  GASTROINTESTINAL: No nausea, vomiting, diarrhea or abdominal pain.  GENITOURINARY: No dysuria, hematuria.  ENDOCRINE: No polyuria, nocturia,  HEMATOLOGY: No anemia, easy bruising or bleeding SKIN: No rash or lesion. MUSCULOSKELETAL: No joint pain or arthritis.   NEUROLOGIC: No tingling, numbness, weakness.  PSYCHIATRY: No anxiety or depression.   DRUG ALLERGIES:   Allergies  Allergen Reactions  . Benzocaine Other (See Comments)    Unknown  . Lisinopril Cough  . Neomycin Other (See Comments)    On allergy testing  . Sulfamethoxazole-Trimethoprim Other (See Comments)    Unknown  . Sulfa Antibiotics Rash    VITALS:  Blood pressure (!) 146/93, pulse 74, temperature (!) 97.5 F (36.4 C), temperature source Oral, resp. rate 18, height 5\' 1"  (1.549 m), weight 80.7 kg (177 lb 14.6 oz), SpO2 95 %.  PHYSICAL EXAMINATION:  GENERAL:  77 y.o.-year-old patient lying in the bed with no acute distress.  EYES: Pupils equal, round, reactive to light and accommodation. No scleral icterus. Extraocular muscles intact.  HEENT: Head atraumatic, normocephalic. Oropharynx and nasopharynx clear.  NECK:  Supple, no jugular venous distention. No thyroid enlargement, no tenderness.   LUNGS: Normal breath sounds bilaterally, no wheezing, rales,rhonchi or crepitation. No use of accessory muscles of respiration.  CARDIOVASCULAR: S1, S2 irregularly irregular ,norubs, or gallops.  ABDOMEN: Soft, nontender, nondistended. Bowel sounds present. No organomegaly or mass.  EXTREMITIES: No pedal edema, cyanosis, or clubbing.  NEUROLOGIC: Cranial nerves II through XII are intact. Muscle strength 5/5 in all extremities. Sensation intact. Gait not checked.  PSYCHIATRIC: The patient is alert and oriented x 3.  SKIN: No obvious rash, lesion, or ulcer.    LABORATORY PANEL:   CBC Recent Labs  Lab 02/21/18 0539  WBC 6.7  HGB 9.0*  HCT 27.1*  PLT 183   ------------------------------------------------------------------------------------------------------------------  Chemistries  Recent Labs  Lab 02/18/18 1058 02/20/18 0341  NA 133* 136  K 3.9 3.9  CL 102 109  CO2 21* 19*  GLUCOSE 172* 123*  BUN 40* 27*  CREATININE 1.59* 1.02*  CALCIUM 9.4 9.0  AST 55*  --   ALT 21  --   ALKPHOS 94  --   BILITOT 1.2  --    ------------------------------------------------------------------------------------------------------------------  Cardiac Enzymes Recent Labs  Lab 02/19/18 0358  TROPONINI 0.30*   ------------------------------------------------------------------------------------------------------------------  RADIOLOGY:  No results found.  EKG:   Orders placed or performed during the hospital encounter of 02/18/18  . ED EKG  . ED EKG  . EKG 12-Lead  . EKG 12-Lead  . EKG 12-Lead  . EKG 12-Lead    ASSESSMENT AND PLAN:  77 year old female patient with history of recent PCI with 2 drug-eluting stents in mid LAD last week comes in with generalized weakness, found to  have UTI. #1. generalized weakness secondary to UTI: Patient really felt very weak and could not get up from her chair yesterday, patient supposed to see [name] and get ready for appointment but  she could not get up from chair when the family arrived she was very weak and they called ambulance.   2.  Acute kidney injury secondary to dehydration:  kidney function improved from creatinine 1.59-1.02 .    3.  E. coli UTI: Pansensitive.  Changed to Keflex.   .  #4. history of CAD status post PCI and drug-eluting stent recently 1 week ago, now has slightly elevated troponins.  Patient has no chest pain.  Monitor on telemetry.  #5. new onset atrial fibrillation: Likely secondary to UTI, dehydration, spoke with [name] today and to start back on Xarelto.  She will Xarelto for 4 years before and she said that she would start Xarelto.  Discussed risk and benefits of anticoagulation.   continue metoprolol, Cardizem CD 120 mg p.o. daily, Xarelto, monitor on telemetry today. But appreciate Dr. Clayborn Bigness to make further recommendation, possibly can increase Cardizem CD to 180 mg daily if patient has further tachycardia.  Patient already on high-dose beta-blocker metoprolol 100 mg twice daily.  .  All the records are reviewed and case discussed with Care Management/Social Workerr. Management plans discussed with the patient, family and they are in agreement.  CODE STATUS:full  TOTAL TIME TAKING CARE OF THIS PATIENT: 35 minutes.   POSSIBLE D/C IN 1-2DAYS, DEPENDING ON CLINICAL CONDITION.  More than 50% of time spent in counseling, coordination of care. Epifanio Lesches M.D on 02/23/2018 at 1:55 PM  Between 7am to 6pm - Pager - [phone]  After 6pm go to [email or web address] - password [email or web address] [facility]  [place] [facility]  Office  [phone]  CC: Primary care physician; [name], MD   Note: This dictation was prepared with Dragon dictation along with smaller phrase technology. Any transcriptional errors that result from this process are unintentional.

## 2018-02-24 ENCOUNTER — Encounter: Payer: Self-pay | Admitting: Internal Medicine

## 2018-02-24 ENCOUNTER — Inpatient Hospital Stay: Payer: Medicare Other

## 2018-02-24 ENCOUNTER — Encounter
Admission: RE | Admit: 2018-02-24 | Discharge: 2018-02-24 | Disposition: A | Discharge status: Home / Self Care | Payer: Medicare Other | Source: Ambulatory Visit | Attending: Internal Medicine | Admitting: Internal Medicine

## 2018-02-24 MED ORDER — FUROSEMIDE 40 MG PO TABS
60.0000 mg | ORAL_TABLET | Freq: Once | ORAL | Status: AC
  Administered 2018-02-24: 60 mg via ORAL
  Filled 2018-02-24: qty 2

## 2018-02-24 MED ORDER — DILTIAZEM HCL ER COATED BEADS 180 MG PO CP24
180.0000 mg | ORAL_CAPSULE | Freq: Every day | ORAL | Status: DC
  Administered 2018-02-24 – 2018-02-25 (×2): 180 mg via ORAL
  Filled 2018-02-24 (×2): qty 1

## 2018-02-24 NOTE — Clinical Social Work Note (Signed)
CSW presented bed offers to patient and she chose [facility].  CSW contacted [facility] and they can accept patient once insurance authorization has been completed and patient is medically ready for discharge.  CSW to continue to follow patient's progress throughout discharge planning.  Jones Broom. [place], MSW, [place]  02/24/2018 10:20 AM

## 2018-02-24 NOTE — Progress Notes (Signed)
Melinda Maxwell at [place] NAME: Melinda Maxwell    MR#:  [id number]  DATE OF BIRTH:  1941/06/24    CHIEF COMPLAINT:   Chief Complaint  Patient presents with  . Weakness   Continues to feel weak.  Can barely get out of bed. Heart rate improved but still in A. fib.  Episodes of tachycardia in the morning. Some shortness of breath.  REVIEW OF SYSTEMS:   ROS CONSTITUTIONAL: Generalized weakness. EYES: No blurred or double vision.  EARS, NOSE, AND THROAT: No tinnitus or ear pain.  RESPIRATORY: No cough,  wheezing or hemoptysis.  CARDIOVASCULAR: No chest pain, orthopnea, edema.  GASTROINTESTINAL: No nausea, vomiting, diarrhea or abdominal pain.  GENITOURINARY: No dysuria, hematuria.  ENDOCRINE: No polyuria, nocturia,  HEMATOLOGY: No anemia, easy bruising or bleeding SKIN: No rash or lesion. MUSCULOSKELETAL: No joint pain or arthritis.   NEUROLOGIC: No tingling, numbness, weakness.  PSYCHIATRY: No anxiety or depression.   DRUG ALLERGIES:   Allergies  Allergen Reactions  . Benzocaine Other (See Comments)    Unknown  . Lisinopril Cough  . Neomycin Other (See Comments)    On allergy testing  . Sulfamethoxazole-Trimethoprim Other (See Comments)    Unknown  . Sulfa Antibiotics Rash    VITALS:  Blood pressure (!) 147/90, pulse 80, temperature 98 F (36.7 C), resp. rate 18, height 5\' 1"  (1.549 m), weight 80.7 kg (177 lb 14.6 oz), SpO2 93 %.  PHYSICAL EXAMINATION:  GENERAL:  77 y.o.-year-old patient lying in the bed with no acute distress.  EYES: Pupils equal, round, reactive to light and accommodation. No scleral icterus. Extraocular muscles intact.  HEENT: Head atraumatic, normocephalic. Oropharynx and nasopharynx clear.  NECK:  Supple, no jugular venous distention. No thyroid enlargement, no tenderness.  LUNGS: Normal work of breathing.  Bibasilar crackles. CARDIOVASCULAR: S1, S2 irregularly irregular ,norubs, or gallops.   ABDOMEN: Soft, nontender, nondistended. Bowel sounds present. No organomegaly or mass.  EXTREMITIES: No pedal edema, cyanosis, or clubbing.  NEUROLOGIC: Cranial nerves II through XII are intact. Muscle strength 5/5 in all extremities. Sensation intact. Gait not checked.  PSYCHIATRIC: The patient is alert and oriented x 3.  SKIN: No obvious rash, lesion, or ulcer.    LABORATORY PANEL:   CBC Recent Labs  Lab 02/21/18 0539  WBC 6.7  HGB 9.0*  HCT 27.1*  PLT 183   ------------------------------------------------------------------------------------------------------------------  Chemistries  Recent Labs  Lab 02/18/18 1058 02/20/18 0341  NA 133* 136  K 3.9 3.9  CL 102 109  CO2 21* 19*  GLUCOSE 172* 123*  BUN 40* 27*  CREATININE 1.59* 1.02*  CALCIUM 9.4 9.0  AST 55*  --   ALT 21  --   ALKPHOS 94  --   BILITOT 1.2  --    ------------------------------------------------------------------------------------------------------------------  Cardiac Enzymes Recent Labs  Lab 02/19/18 0358  TROPONINI 0.30*   ------------------------------------------------------------------------------------------------------------------  RADIOLOGY:  No results found.  EKG:   Orders placed or performed during the hospital encounter of 02/18/18  . ED EKG  . ED EKG  . EKG 12-Lead  . EKG 12-Lead  . EKG 12-Lead  . EKG 12-Lead    ASSESSMENT AND PLAN:  77 year old female patient with history of recent PCI with 2 drug-eluting stents in mid LAD last week comes in with generalized weakness, found to have UTI  *E. coli UTI.  On Keflex.  Slowly improving.  Continues to have significant generalized weakness.  Physical therapy evaluated.  Patient may need skilled  nursing facility.   *Acute kidney injury due to UTI.  Resolved.   *CAD with PCI with drug-eluting stent.  Procedure on 02/11/2018.  Patient will need aspirin and Plavix for at least 4 weeks.  Now that patient is on Xarelto aspirin can  be stopped after 1 month.  * Atrial fibrillation with rapid ventricular rate likely secondary to UTI.  On Cardizem, metoprolol.  Xarelto.  *Shortness of breath.  Not hypoxic.  Does have some crackles on examination.  Will give stat dose of Lasix.  Check chest x-ray.  Recent echocardiogram reviewed.  All the records are reviewed and case discussed with Care Management/Social Workerr. Management plans discussed with the patient, family and they are in agreement.  CODE STATUS:full  TOTAL TIME TAKING CARE OF THIS PATIENT: 35 minutes.   POSSIBLE D/C IN 1-2DAYS, DEPENDING ON CLINICAL CONDITION.  Melinda Maxwell M.D on 02/24/2018 at 1:38 PM  Between 7am to 6pm - Pager - [phone]  After 6pm go to [email or web address] - password [email or web address] [facility]  [place] [facility]  Office  [phone]  CC: Primary care physician; Melinda Ruths, MD   Note: This dictation was prepared with Dragon dictation along with smaller phrase technology. Any transcriptional errors that result from this process are unintentional.

## 2018-02-24 NOTE — Care Management Important Message (Signed)
Important Message  Patient Details  Name: Melinda Maxwell MRN: [id number] Date of Birth: 1941/05/24   Medicare Important Message Given:  Yes    [name] 02/24/2018, 12:28 PM

## 2018-02-25 ENCOUNTER — Encounter: Payer: Self-pay | Admitting: *Deleted

## 2018-02-25 LAB — BASIC METABOLIC PANEL
ANION GAP: 8 (ref 5–15)
BUN: 19 mg/dL (ref 6–20)
CHLORIDE: 105 mmol/L (ref 101–111)
CO2: 26 mmol/L (ref 22–32)
Calcium: 9.3 mg/dL (ref 8.9–10.3)
Creatinine, Ser: 1.1 mg/dL — ABNORMAL HIGH (ref 0.44–1.00)
GFR calc non Af Amer: 46 mL/min — ABNORMAL LOW (ref >60)
GFR, EST AFRICAN AMERICAN: 54 mL/min — AB (ref >60)
Glucose, Bld: 129 mg/dL — ABNORMAL HIGH (ref 65–99)
POTASSIUM: 4.1 mmol/L (ref 3.5–5.1)
Sodium: 139 mmol/L (ref 135–145)

## 2018-02-25 MED ORDER — FUROSEMIDE 10 MG/ML IJ SOLN
60.0000 mg | Freq: Two times a day (BID) | INTRAMUSCULAR | Status: AC
  Administered 2018-02-25 (×2): 60 mg via INTRAVENOUS
  Filled 2018-02-25 (×2): qty 6

## 2018-02-25 MED ORDER — SIMVASTATIN 20 MG PO TABS
10.0000 mg | ORAL_TABLET | Freq: Every day | ORAL | Status: DC
  Administered 2018-02-25: 10 mg via ORAL
  Filled 2018-02-25: qty 1

## 2018-02-25 NOTE — Progress Notes (Signed)
[place] at [place] NAME: Melinda Maxwell    MR#:  [id number]  DATE OF BIRTH:  Jul 16, 1941    CHIEF COMPLAINT:   Chief Complaint  Patient presents with  . Weakness   SOB has improved a little. Family at bedside. Still weak  REVIEW OF SYSTEMS:   ROS CONSTITUTIONAL: Generalized weakness. EYES: No blurred or double vision.  EARS, NOSE, AND THROAT: No tinnitus or ear pain.  RESPIRATORY: No cough,  wheezing or hemoptysis.  CARDIOVASCULAR: No chest pain, orthopnea, edema.  GASTROINTESTINAL: No nausea, vomiting, diarrhea or abdominal pain.  GENITOURINARY: No dysuria, hematuria.  ENDOCRINE: No polyuria, nocturia,  HEMATOLOGY: No anemia, easy bruising or bleeding SKIN: No rash or lesion. MUSCULOSKELETAL: No joint pain or arthritis.   NEUROLOGIC: No tingling, numbness, weakness.  PSYCHIATRY: No anxiety or depression.   DRUG ALLERGIES:   Allergies  Allergen Reactions  . Benzocaine Other (See Comments)    Unknown  . Lisinopril Cough  . Neomycin Other (See Comments)    On allergy testing  . Sulfamethoxazole-Trimethoprim Other (See Comments)    Unknown  . Sulfa Antibiotics Rash    VITALS:  Blood pressure (!) 153/107, pulse (!) 135, temperature 97.6 F (36.4 C), temperature source Oral, resp. rate 18, height 5\' 1"  (1.549 m), weight 80.7 kg (177 lb 14.6 oz), SpO2 91 %.  PHYSICAL EXAMINATION:  GENERAL:  77 y.o.-year-old patient lying in the bed with no acute distress.  EYES: Pupils equal, round, reactive to light and accommodation. No scleral icterus. Extraocular muscles intact.  HEENT: Head atraumatic, normocephalic. Oropharynx and nasopharynx clear.  NECK:  Supple, no jugular venous distention. No thyroid enlargement, no tenderness.  LUNGS: Normal work of breathing.  Bibasilar crackles. CARDIOVASCULAR: S1, S2 irregularly irregular ,norubs, or gallops.  ABDOMEN: Soft, nontender, nondistended. Bowel sounds present. No organomegaly  or mass.  EXTREMITIES: No pedal edema, cyanosis, or clubbing.  NEUROLOGIC: Cranial nerves II through XII are intact. Muscle strength 5/5 in all extremities. Sensation intact. Gait not checked.  PSYCHIATRIC: The patient is alert and oriented x 3.  SKIN: No obvious rash, lesion, or ulcer.    LABORATORY PANEL:   CBC Recent Labs  Lab 02/21/18 0539  WBC 6.7  HGB 9.0*  HCT 27.1*  PLT 183   ------------------------------------------------------------------------------------------------------------------  Chemistries  Recent Labs  Lab 02/25/18 0348  NA 139  K 4.1  CL 105  CO2 26  GLUCOSE 129*  BUN 19  CREATININE 1.10*  CALCIUM 9.3   ------------------------------------------------------------------------------------------------------------------  Cardiac Enzymes Recent Labs  Lab 02/19/18 0358  TROPONINI 0.30*   ------------------------------------------------------------------------------------------------------------------  RADIOLOGY:  Dg Chest 2 View  Result Date: 02/24/2018 CLINICAL DATA:  Cough.  Atrial fibrillation. EXAM: CHEST - 2 VIEW COMPARISON:  CT scan of the chest dated 01/21/2016 FINDINGS: Heart size is within normal limits considering the AP technique. Slight pulmonary vascular congestion with bilateral interstitial edema and small bilateral effusions. The right hilum appears slightly prominent. I recommend follow-up chest x-rays to ensure clearing of that area. No acute bone abnormality. IMPRESSION: Mild bilateral interstitial edema with small bilateral effusions. Slight prominence of the right hilum. Follow-up recommended to ensure clearing. Electronically Signed   By: Lorriane Shire M.D.   On: 02/24/2018 14:32    EKG:   Orders placed or performed during the hospital encounter of 02/18/18  . ED EKG  . ED EKG  . EKG 12-Lead  . EKG 12-Lead  . EKG 12-Lead  . EKG 12-Lead  ASSESSMENT AND PLAN:  77 year old female patient with history of recent PCI with  2 drug-eluting stents in mid LAD last week comes in with generalized weakness, found to have UTI  * Acute on chronic diastolic chf Start IV lasix I/Os Repeat BMP in AM  *E. coli UTI.  On Keflex.  Slowly improving.  Continues to have significant generalized weakness.  Physical therapy evaluated.  Patient may need skilled nursing facility.   *Acute kidney injury due to UTI.  Resolved.   *CAD with PCI with drug-eluting stent.  Procedure on 02/11/2018.  Patient will need aspirin and Plavix for at least 4 weeks.  Now that patient is on Xarelto aspirin can be stopped after 1 month.  * Atrial fibrillation with rapid ventricular rate likely secondary to UTI.  On Cardizem, metoprolol.  Xarelto.  All the records are reviewed and case discussed with Care Management/Social Workerr. Management plans discussed with the patient, family and they are in agreement.  CODE STATUS:full  TOTAL TIME TAKING CARE OF THIS PATIENT: 35 minutes.   POSSIBLE D/C IN 1-2 DAYS, DEPENDING ON CLINICAL CONDITION.  Leia Alf Gonzalo Waymire M.D on 02/25/2018 at 5:16 PM  Between 7am to 6pm - Pager - [phone]  After 6pm go to [email or web address] - password [email or web address] [facility]  [place] [facility]  Office  [phone]  CC: Primary care physician; [name], MD   Note: This dictation was prepared with Dragon dictation along with smaller phrase technology. Any transcriptional errors that result from this process are unintentional.

## 2018-02-25 NOTE — Progress Notes (Signed)
PT Cancellation Note  Patient Details Name: Melinda Maxwell MRN: [id number] DOB: 04-24-1941   Cancelled Treatment:    Reason Eval/Treat Not Completed: Patient not medically ready   HR monitored for several minutes on telemetry monitors.  HR fluctuating 110's -150's at rest.  Session held this am and will continue as appropriate.   Chesley Noon 02/25/2018, 12:55 PM

## 2018-02-25 NOTE — Clinical Social Work Note (Signed)
CSW was informed by SNF that they have received insurance authoriztion for her to go to SNF.  CSW continuing to follow patient's progress throughout discharge planning.  Jones Broom. [place], MSW, [place]  02/25/2018 12:27 PM

## 2018-02-25 NOTE — Plan of Care (Signed)
  Problem: Education: Goal: Knowledge of General Education information will improve Outcome: Progressing   Problem: Nutrition: Goal: Adequate nutrition will be maintained Outcome: Progressing   Problem: Coping: Goal: Level of anxiety will decrease Outcome: Progressing   Problem: Pain Managment: Goal: General experience of comfort will improve Outcome: Progressing   Problem: Safety: Goal: Ability to remain free from injury will improve Outcome: Progressing   Problem: Skin Integrity: Goal: Risk for impaired skin integrity will decrease Outcome: Progressing   Problem: Urinary Elimination: Goal: Signs and symptoms of infection will decrease Outcome: Progressing

## 2018-02-26 LAB — BASIC METABOLIC PANEL
ANION GAP: 13 (ref 5–15)
BUN: 27 mg/dL — ABNORMAL HIGH (ref 6–20)
CALCIUM: 9.5 mg/dL (ref 8.9–10.3)
CO2: 26 mmol/L (ref 22–32)
CREATININE: 1.21 mg/dL — AB (ref 0.44–1.00)
Chloride: 99 mmol/L — ABNORMAL LOW (ref 101–111)
GFR calc non Af Amer: 41 mL/min — ABNORMAL LOW (ref >60)
GFR, EST AFRICAN AMERICAN: 48 mL/min — AB (ref >60)
Glucose, Bld: 134 mg/dL — ABNORMAL HIGH (ref 65–99)
Potassium: 4.1 mmol/L (ref 3.5–5.1)
SODIUM: 138 mmol/L (ref 135–145)

## 2018-02-26 MED ORDER — METOPROLOL TARTRATE 25 MG PO TABS: 25.0000 mg | ORAL_TABLET | Freq: Two times a day (BID) | ORAL | Status: DC

## 2018-02-26 MED ORDER — FUROSEMIDE 40 MG PO TABS: 20.0000 mg | ORAL_TABLET | Freq: Every day | ORAL | 0 refills | Status: DC

## 2018-02-26 MED ORDER — DILTIAZEM HCL ER COATED BEADS 120 MG PO CP24: 120.0000 mg | ORAL_CAPSULE | Freq: Every day | ORAL | Status: DC

## 2018-02-26 MED ORDER — METOPROLOL SUCCINATE ER 25 MG PO TB24: 25.0000 mg | ORAL_TABLET | Freq: Every day | ORAL | Status: DC

## 2018-02-26 MED ORDER — RIVAROXABAN 20 MG PO TABS: 20.0000 mg | ORAL_TABLET | Freq: Every day | ORAL | Status: DC

## 2018-02-26 MED ORDER — DILTIAZEM HCL ER COATED BEADS 120 MG PO CP24
120.0000 mg | ORAL_CAPSULE | Freq: Every day | ORAL | Status: DC
  Administered 2018-02-26: 120 mg via ORAL
  Filled 2018-02-26: qty 1

## 2018-02-26 NOTE — Progress Notes (Signed)
Talked to Dr. Darvin Neighbours about patient's plan to be discharge today, order given. Patient is going to [facility]. RN will continue to monitor.

## 2018-02-26 NOTE — Care Management Important Message (Signed)
Copy of signed IM left in patient's room.    

## 2018-02-26 NOTE — Progress Notes (Signed)
Patient converted to sinus rhythm (brady) at 0710.  Spoke to Dr. Darvin Neighbours about adjusting medications.  Awaiting new orders.  Will hold AM meds until orders are adjusted.

## 2018-02-26 NOTE — Clinical Social Work Placement (Signed)
   CLINICAL SOCIAL WORK PLACEMENT  NOTE  Date:  02/26/2018  Patient Details  Name: Melinda Maxwell MRN: [id number] Date of Birth: [birth year]  Clinical Social Work is seeking post-discharge placement for this patient at the [place] level of care (*CSW will initial, date and re-position this form in  chart as items are completed):  Yes   Patient/family provided with [place] Work Department's list of facilities offering this level of care within the geographic area requested by the patient (or if unable, by the patient's family).  Yes   Patient/family informed of their freedom to choose among providers that offer the needed level of care, that participate in Medicare, Medicaid or managed care program needed by the patient, have an available bed and are willing to accept the patient.  Yes   Patient/family informed of [facility]'s ownership interest in [facility] and [facility], as well as of the fact that they are under no obligation to receive care at these facilities.  PASRR submitted to EDS on 02/23/18     PASRR number received on 02/23/18     Existing PASRR number confirmed on       FL2 transmitted to all facilities in geographic area requested by pt/family on 02/23/18     FL2 transmitted to all facilities within larger geographic area on       Patient informed that his/her managed care company has contracts with or will negotiate with certain facilities, including the following:        Yes   Patient/family informed of bed offers received.  Patient chooses bed at [facility]     Physician recommends and patient chooses bed at      Patient to be transferred to [facility] on 02/26/18.  Patient to be transferred to facility by [facility]     Patient family notified on 02/26/18 of transfer.  Name of family member notified:  Satina Jerrell daughter [phone]     PHYSICIAN Please sign FL2     Additional Comment:      _______________________________________________ Ross Ludwig, LCSWA 02/26/2018, 4:12 PM

## 2018-02-26 NOTE — Discharge Summary (Signed)
[place] at [place] NAME: Melinda Maxwell    MR#:  [id number]  DATE OF BIRTH:  Aug 15, 1941  DATE OF ADMISSION:  02/18/2018 ADMITTING PHYSICIAN: Nicholes Mango, MD  DATE OF DISCHARGE: 02/26/2018  PRIMARY CARE PHYSICIAN: [name], MD   ADMISSION DIAGNOSIS:  Dehydration [E86.0] Weakness [R53.1] Elevated troponin [R74.8] Acute cystitis without hematuria [N30.00]  DISCHARGE DIAGNOSIS:  Active Problems:   UTI (urinary tract infection)   SECONDARY DIAGNOSIS:   Past Medical History:  Diagnosis Date  . Breast cancer ([place])   . Hypertension   . MI, old      ADMITTING HISTORY  HISTORY OF PRESENT ILLNESS:  Melinda Maxwell  is a 77 y.o. female with a known history of breast cancer status post chemo and radiation therapy, hypertension, history of coronary artery disease status post MI was seen by [name] and had cardiac cath with 2 stent placement.  For the past 2 to 3 days she was feeling very weak and with poor p.o. intake family members are concerned and brought her into the ED.  Urine looks abnormal.  Troponin at 0.05.  Patient is started on IV Rocephin for urinary tract infection.  HOSPITAL COURSE:   77 year old female patient with history of recent PCI with 2 drug-eluting stents in mid LAD last week comes in with generalized weakness, found to have UTI  * Acute on chronic diastolic chf Started IV lasix.  Lasix at discharge.  *E. coli UTI.    Treated with IV ceftriaxone and later on Keflex.   No need for further antibiotics   *Acute kidney injury due to UTI.  Resolved.  *CAD with PCI with drug-eluting stent.  Procedure on 02/11/2018.  Patient will need aspirin and Plavix for at least 4 weeks.  Now that patient is on Xarelto, aspirin can be stopped after 1 month.  * Atrial fibrillation with rapid ventricular rate likely secondary to UTI.   On Cardizem, metoprolol.  Xarelto.  Generalized weakness.  Will need physical therapy  after discharge  Stop aspirin on 03/14/2018 which will be 1 month after PCI.  Can continue Plavix and Xarelto.  Stable for discharge for further PT  CONSULTS OBTAINED:  Treatment Team:  Teodoro Spray, MD  DRUG ALLERGIES:   Allergies  Allergen Reactions  . Benzocaine Other (See Comments)    Unknown  . Lisinopril Cough  . Neomycin Other (See Comments)    On allergy testing  . Sulfamethoxazole-Trimethoprim Other (See Comments)    Unknown  . Sulfa Antibiotics Rash    DISCHARGE MEDICATIONS:   Allergies as of 02/26/2018      Reactions   Benzocaine Other (See Comments)   Unknown   Lisinopril Cough   Neomycin Other (See Comments)   On allergy testing   Sulfamethoxazole-trimethoprim Other (See Comments)   Unknown   Sulfa Antibiotics Rash      Medication List    STOP taking these medications   losartan 25 MG tablet Commonly known as:  COZAAR   metoprolol tartrate 50 MG tablet Commonly known as:  LOPRESSOR     TAKE these medications   ALLERGY EYE OP Place 1 drop into both eyes daily as needed (for dry eyes).   allopurinol 300 MG tablet Commonly known as:  ZYLOPRIM Take 300 mg by mouth daily.   aspirin EC 81 MG tablet Take 81 mg by mouth daily.   clopidogrel 75 MG tablet Commonly known as:  PLAVIX Take 1 tablet (75 mg  total) by mouth daily with breakfast.   diltiazem 120 MG 24 hr capsule Commonly known as:  CARDIZEM CD Take 1 capsule (120 mg total) by mouth daily. Start taking on:  02/27/2018   furosemide 40 MG tablet Commonly known as:  LASIX Take 0.5 tablets (20 mg total) by mouth daily.   isosorbide mononitrate 30 MG 24 hr tablet Commonly known as:  IMDUR Take 30 mg by mouth daily.   metoprolol succinate 25 MG 24 hr tablet Commonly known as:  TOPROL XL Take 1 tablet (25 mg total) by mouth daily.   nitroGLYCERIN 0.4 MG SL tablet Commonly known as:  NITROSTAT Place 0.4 mg under the tongue every 5 (five) minutes as needed for chest pain.    nystatin powder Commonly known as:  MYCOSTATIN/NYSTOP Apply 1 g topically daily as needed.   pantoprazole 40 MG tablet Commonly known as:  PROTONIX Take 40 mg by mouth daily.   rivaroxaban 20 MG Tabs tablet Commonly known as:  XARELTO Take 1 tablet (20 mg total) by mouth daily. Start taking on:  02/27/2018   simvastatin 40 MG tablet Commonly known as:  ZOCOR Take 40 mg by mouth daily.   spironolactone 25 MG tablet Commonly known as:  ALDACTONE Take 25 mg by mouth daily.   VASELINE Gel Generic drug:  white petrolatum Apply 1 application topically as needed (for eyes and face).       Today   VITAL SIGNS:  Blood pressure 138/71, pulse 61, temperature 97.7 F (36.5 C), temperature source Oral, resp. rate 18, height 5\' 1"  (1.549 m), weight 76.1 kg (167 lb 11.2 oz), SpO2 96 %.  I/O:    Intake/Output Summary (Last 24 hours) at 02/26/2018 1424 Last data filed at 02/26/2018 1412 Gross per 24 hour  Intake 480 ml  Output 1800 ml  Net -1320 ml    PHYSICAL EXAMINATION:  Physical Exam  GENERAL:  77 y.o.-year-old patient lying in the bed with no acute distress.  LUNGS: Normal breath sounds bilaterally, no wheezing, rales,rhonchi or crepitation. No use of accessory muscles of respiration.  CARDIOVASCULAR: S1, S2 normal. No murmurs, rubs, or gallops.  ABDOMEN: Soft, non-tender, non-distended. Bowel sounds present. No organomegaly or mass.  NEUROLOGIC: Moves all 4 extremities. PSYCHIATRIC: The patient is alert and oriented x 3.  SKIN: No obvious rash, lesion, or ulcer.   DATA REVIEW:   CBC Recent Labs  Lab 02/21/18 0539  WBC 6.7  HGB 9.0*  HCT 27.1*  PLT 183    Chemistries  Recent Labs  Lab 02/26/18 0451  NA 138  K 4.1  CL 99*  CO2 26  GLUCOSE 134*  BUN 27*  CREATININE 1.21*  CALCIUM 9.5    Cardiac Enzymes No results for input(s): TROPONINI in the last 168 hours.  Microbiology Results  Results for orders placed or performed during the hospital  encounter of 02/18/18  Urine culture     Status: Abnormal   Collection Time: 02/18/18 12:15 PM  Result Value Ref Range Status   Specimen Description   Final    URINE, CLEAN CATCH Performed at [facility], [address], [place], [place] [address]    Special Requests   Final    Normal Performed at [facility], [address] [place], [place] [address]    Culture >=100,000 COLONIES/mL ESCHERICHIA COLI (A)  Final   Report Status 02/20/2018 FINAL  Final   Organism ID, Bacteria ESCHERICHIA COLI (A)  Final      Susceptibility  Escherichia coli - MIC*    AMPICILLIN 8 SENSITIVE Sensitive     CEFAZOLIN <=4 SENSITIVE Sensitive     CEFTRIAXONE <=1 SENSITIVE Sensitive     CIPROFLOXACIN <=0.25 SENSITIVE Sensitive     GENTAMICIN <=1 SENSITIVE Sensitive     IMIPENEM <=0.25 SENSITIVE Sensitive     NITROFURANTOIN <=16 SENSITIVE Sensitive     TRIMETH/SULFA <=20 SENSITIVE Sensitive     AMPICILLIN/SULBACTAM <=2 SENSITIVE Sensitive     PIP/TAZO <=4 SENSITIVE Sensitive     Extended ESBL NEGATIVE Sensitive     * >=100,000 COLONIES/mL ESCHERICHIA COLI    RADIOLOGY:  No results found.  Follow up with PCP in 1 week.  Management plans discussed with the patient, family and they are in agreement.  CODE STATUS:     Code Status Orders  (From admission, onward)        Start     Ordered   02/18/18 1559  Full code  Continuous     02/18/18 1558    Code Status History    Date Active Date Inactive Code Status Order ID Comments User Context   02/11/2018 1005 02/12/2018 1833 Full Code [id number]  Nelva Bush, MD Inpatient    Advance Directive Documentation     Most Recent Value  Type of Advance Directive  Healthcare Power of Attorney  Pre-existing out of facility DNR order (yellow form or pink MOST form)  -  "MOST" Form in Place?  -      TOTAL TIME TAKING CARE OF THIS PATIENT ON DAY OF DISCHARGE: more than 30 minutes.   Leia Alf Yuya Vanwingerden M.D on 02/26/2018 at 2:24  PM  Between 7am to 6pm - Pager - [phone]  After 6pm go to [email or web address] - password [email or web address] [place] [facility]  Office  [phone]  CC: Primary care physician; [name], MD  Note: This dictation was prepared with Dragon dictation along with smaller phrase technology. Any transcriptional errors that result from this process are unintentional.

## 2018-02-26 NOTE — Progress Notes (Signed)
Physical Therapy Treatment Patient Details Name: Melinda Maxwell MRN: [id number] DOB: [birth year] Today's Date: 02/26/2018    History of Present Illness Pt admitted for weakness in B LEs. Pt with history of breast cancer on chemo and radiation, and CAD. Pt also with recent cath and stent placement on 02/11/18. Upon trying to leave her house for MD appointment, unable to get up from chair, severe weakness. Now diagnosed with UTI. Pt with elevated HR throughout hospital stay, however currently is under control and per RN is safe to mobilize with therapy.    PT Comments    Pt is making good progress towards goals with improved balance and upright posture this date. Still presents with post lean with initial standing, however improved tolerance from previous session. Still not at baseline level, needs SNF at discharge. Very fearful of falling. Good endurance with HEP.   Follow Up Recommendations  SNF     Equipment Recommendations  Rolling walker with 5" wheels    Recommendations for Other Services       Precautions / Restrictions Precautions Precautions: Fall Restrictions Weight Bearing Restrictions: No    Mobility  Bed Mobility Overal bed mobility: Needs Assistance Bed Mobility: Supine to Sit     Supine to sit: Min assist     General bed mobility comments: able to sit at EOB by walking feet off and needs assist for B hips. Once seated at EOB, slight dizziness noted, however subsides quickly  Transfers Overall transfer level: Needs assistance Equipment used: Rolling walker (2 wheeled) Transfers: Sit to/from Stand Sit to Stand: Mod assist         General transfer comment: needs assist for standing including demonstration prior to performance. Pt needs mod assist for ant translation over COG and B UE support on RW. Once standing able to stand for approx 30 seconds each rep. Very fearful of falling  Ambulation/Gait Ambulation/Gait assistance: Min assist Ambulation Distance  (Feet): 3 Feet Assistive device: Rolling walker (2 wheeled) Gait Pattern/deviations: Step-to pattern     General Gait Details: needs heavy cues for sequencing and guidance for RW movement. Small steps towards recliner. NO dizziness noted. HR remained WNL   Stairs             Wheelchair Mobility    Modified Rankin (Stroke Patients Only)       Balance                                            Cognition Arousal/Alertness: Awake/alert Behavior During Therapy: WFL for tasks assessed/performed Overall Cognitive Status: Within Functional Limits for tasks assessed                                        Exercises Other Exercises Other Exercises: supine ther-ex performed on B LE including ankle pumps, SLRs, hip abd/add, resisted heel slides, supine bridging and quad sets. All ther-ex performed x 10 reps with min assist. Other Exercises: Weight shift and static standing balance activities performed with sit<>Stand trials. RW used    General Comments        Pertinent Vitals/Pain Pain Assessment: No/denies pain    Home Living                      Prior Function  PT Goals (current goals can now be found in the care plan section) Acute Rehab PT Goals Patient Stated Goal: to get stronger PT Goal Formulation: With patient Time For Goal Achievement: 03/08/18 Potential to Achieve Goals: Good Progress towards PT goals: Progressing toward goals    Frequency    Min 2X/week      PT Plan Current plan remains appropriate    Co-evaluation              AM-PAC PT "6 Clicks" Daily Activity  Outcome Measure  Difficulty turning over in bed (including adjusting bedclothes, sheets and blankets)?: Unable Difficulty moving from lying on back to sitting on the side of the bed? : Unable Difficulty sitting down on and standing up from a chair with arms (e.g., wheelchair, bedside commode, etc,.)?: Unable Help needed  moving to and from a bed to chair (including a wheelchair)?: A Little Help needed walking in hospital room?: A Lot Help needed climbing 3-5 steps with a railing? : Total 6 Click Score: 9    End of Session Equipment Utilized During Treatment: Gait belt Activity Tolerance: Patient tolerated treatment well Patient left: in chair;with chair alarm set Nurse Communication: Mobility status PT Visit Diagnosis: Muscle weakness (generalized) (M62.81);Difficulty in walking, not elsewhere classified (R26.2)     Time: 9407-6808 PT Time Calculation (min) (ACUTE ONLY): 27 min  Charges:  $Gait Training: 8-22 mins $Therapeutic Exercise: 8-22 mins                    G Codes:       Greggory Stallion, PT, DPT [phone]    Dannie Woolen 02/26/2018, 3:38 PM

## 2018-02-26 NOTE — Discharge Instructions (Signed)
Heart healthy diet

## 2018-02-26 NOTE — Progress Notes (Signed)
Report called to [facility] and EMS called for transportation.

## 2018-02-26 NOTE — Clinical Social Work Note (Signed)
Patient to be d/c'ed today to [facility] room 202B.  Patient and family agreeable to plans will transport via ems RN to call report. CSW left message on daughter susan's voice mail informing her that patient will be discharging today.  Evette Cristal, MSW, [place]

## 2018-02-26 NOTE — Progress Notes (Addendum)
EMS is here to pick up patient to go to [place]. Talked to daughter [name] about the transfer.

## 2018-02-27 DIAGNOSIS — I480 Paroxysmal atrial fibrillation: Secondary | ICD-10-CM | POA: Insufficient documentation

## 2018-03-08 ENCOUNTER — Encounter
Admission: RE | Admit: 2018-03-08 | Discharge: 2018-03-08 | Disposition: A | Discharge status: Home / Self Care | Payer: Medicare Other | Source: Ambulatory Visit | Attending: Internal Medicine | Admitting: Internal Medicine

## 2018-03-17 ENCOUNTER — Other Ambulatory Visit
Admission: RE | Admit: 2018-03-17 | Discharge: 2018-03-17 | Disposition: A | Discharge status: Home / Self Care | Payer: Medicare Other | Source: Ambulatory Visit | Attending: Internal Medicine | Admitting: Internal Medicine

## 2018-03-17 ENCOUNTER — Encounter: Payer: Self-pay | Admitting: Gerontology

## 2018-03-17 ENCOUNTER — Non-Acute Institutional Stay (SKILLED_NURSING_FACILITY): Payer: Medicare Other | Admitting: Gerontology

## 2018-03-17 DIAGNOSIS — R42 Dizziness and giddiness: Secondary | ICD-10-CM | POA: Insufficient documentation

## 2018-03-17 DIAGNOSIS — I25118 Atherosclerotic heart disease of native coronary artery with other forms of angina pectoris: Secondary | ICD-10-CM

## 2018-03-17 DIAGNOSIS — N3000 Acute cystitis without hematuria: Secondary | ICD-10-CM | POA: Diagnosis not present

## 2018-03-17 DIAGNOSIS — R531 Weakness: Secondary | ICD-10-CM | POA: Diagnosis not present

## 2018-03-17 LAB — URINALYSIS, ROUTINE W REFLEX MICROSCOPIC
BILIRUBIN URINE: NEGATIVE
GLUCOSE, UA: NEGATIVE mg/dL
Hgb urine dipstick: NEGATIVE
KETONES UR: NEGATIVE mg/dL
NITRITE: NEGATIVE
PH: 5 (ref 5.0–8.0)
Protein, ur: NEGATIVE mg/dL
SPECIFIC GRAVITY, URINE: 1.01 (ref 1.005–1.030)

## 2018-03-17 LAB — CBC WITH DIFFERENTIAL/PLATELET
BASOS PCT: 1 %
Basophils Absolute: 0 10*3/uL (ref 0–0.1)
Eosinophils Absolute: 0.2 10*3/uL (ref 0–0.7)
Eosinophils Relative: 3 %
HEMATOCRIT: 34.8 % — AB (ref 35.0–47.0)
Hemoglobin: 11.6 g/dL — ABNORMAL LOW (ref 12.0–16.0)
LYMPHS ABS: 2 10*3/uL (ref 1.0–3.6)
Lymphocytes Relative: 21 %
MCH: 29.8 pg (ref 26.0–34.0)
MCHC: 33.3 g/dL (ref 32.0–36.0)
MCV: 89.5 fL (ref 80.0–100.0)
MONO ABS: 0.6 10*3/uL (ref 0.2–0.9)
MONOS PCT: 6 %
NEUTROS ABS: 6.6 10*3/uL — AB (ref 1.4–6.5)
Neutrophils Relative %: 69 %
Platelets: 275 10*3/uL (ref 150–440)
RBC: 3.89 MIL/uL (ref 3.80–5.20)
RDW: 16.6 % — AB (ref 11.5–14.5)
WBC: 9.5 10*3/uL (ref 3.6–11.0)

## 2018-03-17 LAB — COMPREHENSIVE METABOLIC PANEL
ALBUMIN: 4.2 g/dL (ref 3.5–5.0)
ALT: 16 U/L (ref 14–54)
ANION GAP: 11 (ref 5–15)
AST: 22 U/L (ref 15–41)
Alkaline Phosphatase: 119 U/L (ref 38–126)
BILIRUBIN TOTAL: 0.6 mg/dL (ref 0.3–1.2)
BUN: 47 mg/dL — ABNORMAL HIGH (ref 6–20)
CALCIUM: 10.4 mg/dL — AB (ref 8.9–10.3)
CO2: 22 mmol/L (ref 22–32)
Chloride: 96 mmol/L — ABNORMAL LOW (ref 101–111)
Creatinine, Ser: 1.48 mg/dL — ABNORMAL HIGH (ref 0.44–1.00)
GFR, EST AFRICAN AMERICAN: 38 mL/min — AB (ref >60)
GFR, EST NON AFRICAN AMERICAN: 32 mL/min — AB (ref >60)
GLUCOSE: 142 mg/dL — AB (ref 65–99)
POTASSIUM: 4.5 mmol/L (ref 3.5–5.1)
Sodium: 129 mmol/L — ABNORMAL LOW (ref 135–145)
TOTAL PROTEIN: 7.9 g/dL (ref 6.5–8.1)

## 2018-03-17 NOTE — Progress Notes (Signed)
Location:   [facility] Room Number: 202B Place of Service:  SNF [id number]) Provider:  Toni Arthurs, NP-C  [name], MD  Patient Care Team: [name], MD as PCP - General (Internal Medicine)  Extended Emergency Contact Information Primary Emergency Contact: Campise,Susan Address: [address]          [place], [place] [address] Johnnette Litter of [place] Phone: [phone] Work Phone: [phone] Relation: Daughter Secondary Emergency Contact: Liana Gerold Address: [address]          [place], [place] [address] Johnnette Litter of [place] Phone: [phone] Relation: Sister  Code Status:  FULL Goals of care: Advanced Directive information Advanced Directives 03/17/2018  Does Patient Have a Medical Advance Directive? No  Type of Advance Directive -  Does patient want to make changes to medical advance directive? No - Patient declined  Copy of [place] in Chart? -  Would patient like information on creating a medical advance directive? -     Chief Complaint  Patient presents with  . Medical Management of Chronic Issues    Routine Visit    HPI:  Pt is a 77 y.o. female seen today for medical management of chronic diseases.  Patient was admitted to the facility for rehab due to generalized weakness and deconditioning after recent hospitalization for CAD with PCI stent.  Patient had returned home and was having increased weakness, malaise and lethargy.  Patient brought back to the ED by family due to their concern.  Patient was found to have UTI.  She was given IV antibiotics in the emergency room and then transitioned to p.o. Antibiotics, and completed the course.  Patient now reports she is feeling well.  She is participating in PT/OT.  She is progressing well thus complain of occasional ongoing fatigue, but otherwise is feeling better.  Patient denies chest pain or shortness of breath.  Minimal RLE edema.   Encouraged patient to elevate legs when at rest.  She reports her appetite is improving.  Voiding well and having regular BMs.  Denies pain.  Vital signs stable.  No other complaints.     Past Medical History:  Diagnosis Date  . Arthritis   . Asthma without status asthmaticus    unspecified  . Breast cancer ([place])   . Coronary artery disease   . Diverticulosis   . DVT (deep venous thrombosis) ([place])   . Edema   . Gout   . Hyperlipidemia   . Hypertension   . MI, old   . Myocardial infarct ([place]) 2004  . Sleep apnea    Past Surgical History:  Procedure Laterality Date  . ABDOMINAL HYSTERECTOMY    . APPENDECTOMY    . BREAST LUMPECTOMY     L breast  . CATARACT EXTRACTION    . COLONOSCOPY     08/27/1989, 01/03/1998, 06/09/2004  . CORONARY STENT INTERVENTION N/A 02/11/2018   Procedure: CORONARY STENT INTERVENTION;  Surgeon: Nelva Bush, MD;  Location: [facility];  Service: Cardiovascular;  Laterality: N/A;  . ESOPHAGOGASTRODUODENOSCOPY  06/09/2004  . FLEXIBLE SIGMOIDOSCOPY    . HEMICOLECTOMY    . LEFT HEART CATH AND CORONARY ANGIOGRAPHY Left 02/11/2018   Procedure: LEFT HEART CATH AND CORONARY ANGIOGRAPHY;  Surgeon: Corey Skains, MD;  Location: [facility];  Service: Cardiovascular;  Laterality: Left;  . ORIF ANKLE FRACTURE  2004    Allergies  Allergen Reactions  . Benzocaine Other (See Comments)  Unknown  . Lisinopril Cough  . Neomycin Other (See Comments)    On allergy testing  . Sulfamethoxazole-Trimethoprim Other (See Comments)    Unknown  . Sulfa Antibiotics Rash    Allergies as of 03/17/2018      Reactions   Benzocaine Other (See Comments)   Unknown   Lisinopril Cough   Neomycin Other (See Comments)   On allergy testing   Sulfamethoxazole-trimethoprim Other (See Comments)   Unknown   Sulfa Antibiotics Rash      Medication List        Accurate as of 03/17/18  3:15 PM. Always use your most recent med list.          ALLERGY  EYE OP Place 1 drop into both eyes daily as needed (for dry eyes).   allopurinol 300 MG tablet Commonly known as:  ZYLOPRIM Take 300 mg by mouth daily.   clopidogrel 75 MG tablet Commonly known as:  PLAVIX Take 1 tablet (75 mg total) by mouth daily with breakfast.   diltiazem 120 MG 24 hr capsule Commonly known as:  CARDIZEM CD Take 1 capsule (120 mg total) by mouth daily.   furosemide 40 MG tablet Commonly known as:  LASIX Take 40 mg by mouth daily.   guaiFENesin 100 MG/5ML liquid Commonly known as:  ROBITUSSIN Take 200 mg by mouth every 4 (four) hours as needed for cough. Notify provider if cough worsens/ continues longer than 3 days.   isosorbide mononitrate 30 MG 24 hr tablet Commonly known as:  IMDUR Take 30 mg by mouth daily.   metoprolol succinate 25 MG 24 hr tablet Commonly known as:  TOPROL XL Take 1 tablet (25 mg total) by mouth daily.   nitroGLYCERIN 0.4 MG SL tablet Commonly known as:  NITROSTAT Place 0.4 mg under the tongue every 5 (five) minutes as needed for chest pain.   nystatin powder Commonly known as:  MYCOSTATIN/NYSTOP Apply 1 g topically daily as needed. areas or skinfolds for yeast   pantoprazole 40 MG tablet Commonly known as:  PROTONIX Take 40 mg by mouth daily.   rivaroxaban 20 MG Tabs tablet Commonly known as:  XARELTO Take 1 tablet (20 mg total) by mouth daily.   simvastatin 10 MG tablet Commonly known as:  ZOCOR Take 10 mg by mouth daily. HLD- reduced d/t interaction with Diltiazem- increased r/f Rhabdomyelosis   spironolactone 25 MG tablet Commonly known as:  ALDACTONE Take 25 mg by mouth daily.   VASELINE Gel Generic drug:  white petrolatum Apply 1 application topically as needed (for eyes and face).       Review of Systems  Constitutional: Negative for activity change, appetite change, chills, diaphoresis and fever.  HENT: Negative for congestion, mouth sores, nosebleeds, postnasal drip, sneezing, sore throat, trouble  swallowing and voice change.   Respiratory: Negative for apnea, cough, choking, chest tightness, shortness of breath and wheezing.   Cardiovascular: Positive for leg swelling. Negative for chest pain and palpitations.  Gastrointestinal: Negative for abdominal distention, abdominal pain, constipation, diarrhea and nausea.  Genitourinary: Negative for difficulty urinating, dysuria, frequency and urgency.  Musculoskeletal: Positive for gait problem. Negative for back pain and myalgias. Arthralgias: typical arthritis.  Skin: Negative for color change, pallor, rash and wound.  Neurological: Positive for weakness. Negative for dizziness, tremors, syncope, speech difficulty, numbness and headaches.  Psychiatric/Behavioral: Negative for agitation and behavioral problems.  All other systems reviewed and are negative.   Immunization History  Administered Date(s) Administered  . Influenza Inj Mdck  Quad Pf 09/19/2016  . Influenza Split 07/09/2014  . Influenza, High Dose Seasonal PF 08/05/2017   There are no preventive care reminders to display for this patient. No flowsheet data found. Functional Status Survey:    Vitals:   03/17/18 1445  BP: (!) 126/51  Pulse: 71  Resp: 16  Temp: 97.8 F (36.6 C)  TempSrc: Oral  SpO2: 100%  Weight: 160 lb (72.6 kg)  Height: '5\' 1"'$  (1.549 m)   Body mass index is 30.23 kg/m. Physical Exam  Constitutional: She is oriented to person, place, and time. Vital signs are normal. She appears well-developed and well-nourished. She is active and cooperative. She does not appear ill. No distress.  HENT:  Head: Normocephalic and atraumatic.  Mouth/Throat: Uvula is midline, oropharynx is clear and moist and mucous membranes are normal. Mucous membranes are not pale, not dry and not cyanotic.  Eyes: Pupils are equal, round, and reactive to light. Conjunctivae, EOM and lids are normal.  Neck: Trachea normal, normal range of motion and full passive range of motion  without pain. Neck supple. No JVD present. No tracheal deviation, no edema and no erythema present. No thyromegaly present.  Cardiovascular: Normal rate, normal heart sounds, intact distal pulses and normal pulses. An irregular rhythm present. Exam reveals no gallop, no distant heart sounds and no friction rub.  No murmur heard. Pulses:      Dorsalis pedis pulses are 2+ on the right side, and 2+ on the left side.  1+ RLE edema  Pulmonary/Chest: Effort normal and breath sounds normal. No accessory muscle usage. No respiratory distress. She has no decreased breath sounds. She has no wheezes. She has no rhonchi. She has no rales. She exhibits no tenderness.  Abdominal: Soft. Normal appearance and bowel sounds are normal. She exhibits no distension and no ascites. There is no tenderness.  Musculoskeletal: Normal range of motion. She exhibits no edema or tenderness.  Expected osteoarthritis, stiffness; Bilateral Calves soft, supple. Negative Homan's Sign. B- pedal pulses equal; generalized weakness and deconditioning  Neurological: She is alert and oriented to person, place, and time. She has normal strength.  Skin: Skin is warm, dry and intact. She is not diaphoretic. No cyanosis. No pallor. Nails show no clubbing.  Psychiatric: She has a normal mood and affect. Her speech is normal and behavior is normal. Judgment and thought content normal. Cognition and memory are normal.  Nursing note and vitals reviewed.   Labs reviewed: Recent Labs    02/11/18 1542  02/25/18 0348 02/26/18 0451 03/17/18 1309  NA 137   < > 139 138 129*  K 4.5   < > 4.1 4.1 4.5  CL 105   < > 105 99* 96*  CO2 24   < > '26 26 22  '$ GLUCOSE 152*   < > 129* 134* 142*  BUN 17   < > 19 27* 47*  CREATININE 0.99   < > 1.10* 1.21* 1.48*  CALCIUM 9.6   < > 9.3 9.5 10.4*  MG 2.0  --   --   --   --    < > = values in this interval not displayed.   Recent Labs    02/18/18 1058 03/17/18 1309  AST 55* 22  ALT 21 16  ALKPHOS 94  119  BILITOT 1.2 0.6  PROT 7.6 7.9  ALBUMIN 3.5 4.2   Recent Labs    02/18/18 1058 02/20/18 0953 02/21/18 0539 03/17/18 1309  WBC 7.0 7.1 6.7 9.5  NEUTROABS  5.9  --   --  6.6*  HGB 10.7* 9.4* 9.0* 11.6*  HCT 31.7* 28.4* 27.1* 34.8*  MCV 91.5 91.5 90.7 89.5  PLT 201 181 183 275   No results found for: TSH No results found for: HGBA1C No results found for: CHOL, HDL, LDLCALC, LDLDIRECT, TRIG, CHOLHDL  Significant Diagnostic Results in last 30 days:  Dg Chest 2 View  Result Date: 02/24/2018 CLINICAL DATA:  Cough.  Atrial fibrillation. EXAM: CHEST - 2 VIEW COMPARISON:  CT scan of the chest dated 01/21/2016 FINDINGS: Heart size is within normal limits considering the AP technique. Slight pulmonary vascular congestion with bilateral interstitial edema and small bilateral effusions. The right hilum appears slightly prominent. I recommend follow-up chest x-rays to ensure clearing of that area. No acute bone abnormality. IMPRESSION: Mild bilateral interstitial edema with small bilateral effusions. Slight prominence of the right hilum. Follow-up recommended to ensure clearing. Electronically Signed   By: Lorriane Shire M.D.   On: 02/24/2018 14:32    Assessment/Plan  Coronary artery disease of native artery of native heart with stable angina pectoris (HCC)  Generalized weakness  Acute cystitis without hematuria   Continue PT/OT  Continue exercises as taught by PT/OT  Ambulate with assistance  Monitor for chest pain or shortness of breath  Elevate legs when at rest  TED hose during the day as needed edema  Encourage p.o. fluid intake  Safety precautions  Fall precautions  Continue Xarelto 20 mg p.o. daily for CAD  Follow-up with cardiologist as instructed  Family/ staff Communication:   Total Time:  Documentation:  Face to Face:  Family/Phone:   Labs/tests ordered: CBC, met C, vitamin D, vitamin B12, magnesium, TSH  Medication list reviewed and assessed for  continued appropriateness. Monthly medication orders reviewed and signed.  Vikki Ports, NP-C Geriatrics [facility] [phone], [place] [address] Cell Phone (Mon-Fri 8am-5pm):  [phone] On Call:  [phone] & follow prompts after 5pm & weekends Office Phone:  [phone] Office Fax:  [phone]

## 2018-03-21 ENCOUNTER — Other Ambulatory Visit
Admission: RE | Admit: 2018-03-21 | Discharge: 2018-03-21 | Disposition: A | Discharge status: Home / Self Care | Payer: Medicare Other | Source: Ambulatory Visit | Attending: Gerontology | Admitting: Gerontology

## 2018-03-21 DIAGNOSIS — R55 Syncope and collapse: Secondary | ICD-10-CM | POA: Insufficient documentation

## 2018-03-21 LAB — CBC WITH DIFFERENTIAL/PLATELET
BASOS ABS: 0 10*3/uL (ref 0–0.1)
Basophils Relative: 1 %
EOS ABS: 0.2 10*3/uL (ref 0–0.7)
Eosinophils Relative: 3 %
HCT: 33 % — ABNORMAL LOW (ref 35.0–47.0)
HEMOGLOBIN: 10.8 g/dL — AB (ref 12.0–16.0)
LYMPHS ABS: 1.9 10*3/uL (ref 1.0–3.6)
LYMPHS PCT: 22 %
MCH: 29.4 pg (ref 26.0–34.0)
MCHC: 32.9 g/dL (ref 32.0–36.0)
MCV: 89.5 fL (ref 80.0–100.0)
Monocytes Absolute: 0.6 10*3/uL (ref 0.2–0.9)
Monocytes Relative: 7 %
NEUTROS PCT: 67 %
Neutro Abs: 5.9 10*3/uL (ref 1.4–6.5)
PLATELETS: 283 10*3/uL (ref 150–440)
RBC: 3.69 MIL/uL — AB (ref 3.80–5.20)
RDW: 16.4 % — ABNORMAL HIGH (ref 11.5–14.5)
WBC: 8.7 10*3/uL (ref 3.6–11.0)

## 2018-03-21 LAB — COMPREHENSIVE METABOLIC PANEL
ALBUMIN: 4.2 g/dL (ref 3.5–5.0)
ALT: 17 U/L (ref 14–54)
ANION GAP: 15 (ref 5–15)
AST: 22 U/L (ref 15–41)
Alkaline Phosphatase: 107 U/L (ref 38–126)
BILIRUBIN TOTAL: 0.7 mg/dL (ref 0.3–1.2)
BUN: 43 mg/dL — ABNORMAL HIGH (ref 6–20)
CO2: 22 mmol/L (ref 22–32)
Calcium: 10.4 mg/dL — ABNORMAL HIGH (ref 8.9–10.3)
Chloride: 96 mmol/L — ABNORMAL LOW (ref 101–111)
Creatinine, Ser: 1.46 mg/dL — ABNORMAL HIGH (ref 0.44–1.00)
GFR calc Af Amer: 38 mL/min — ABNORMAL LOW (ref >60)
GFR calc non Af Amer: 33 mL/min — ABNORMAL LOW (ref >60)
GLUCOSE: 128 mg/dL — AB (ref 65–99)
POTASSIUM: 4.3 mmol/L (ref 3.5–5.1)
SODIUM: 133 mmol/L — AB (ref 135–145)
Total Protein: 7.9 g/dL (ref 6.5–8.1)

## 2018-03-21 LAB — VITAMIN B12: Vitamin B-12: 223 pg/mL (ref 180–914)

## 2018-03-21 LAB — TSH: TSH: 1.463 u[IU]/mL (ref 0.350–4.500)

## 2018-03-21 LAB — MAGNESIUM: MAGNESIUM: 2 mg/dL (ref 1.7–2.4)

## 2018-03-22 LAB — VITAMIN D 25 HYDROXY (VIT D DEFICIENCY, FRACTURES): Vit D, 25-Hydroxy: 29.9 ng/mL — ABNORMAL LOW (ref 30.0–100.0)

## 2018-03-26 DIAGNOSIS — R531 Weakness: Secondary | ICD-10-CM | POA: Insufficient documentation

## 2018-03-26 DIAGNOSIS — I2510 Atherosclerotic heart disease of native coronary artery without angina pectoris: Secondary | ICD-10-CM | POA: Insufficient documentation

## 2018-12-15 ENCOUNTER — Encounter: Payer: Medicare Other | Attending: Internal Medicine | Admitting: *Deleted

## 2018-12-15 ENCOUNTER — Encounter: Payer: Self-pay | Admitting: *Deleted

## 2018-12-15 VITALS — Ht 61.1 in | Wt 167.0 lb

## 2018-12-15 DIAGNOSIS — Z955 Presence of coronary angioplasty implant and graft: Secondary | ICD-10-CM | POA: Diagnosis not present

## 2018-12-15 DIAGNOSIS — I82409 Acute embolism and thrombosis of unspecified deep veins of unspecified lower extremity: Secondary | ICD-10-CM | POA: Insufficient documentation

## 2018-12-15 DIAGNOSIS — M109 Gout, unspecified: Secondary | ICD-10-CM | POA: Insufficient documentation

## 2018-12-15 DIAGNOSIS — I2510 Atherosclerotic heart disease of native coronary artery without angina pectoris: Secondary | ICD-10-CM | POA: Insufficient documentation

## 2018-12-15 DIAGNOSIS — M1990 Unspecified osteoarthritis, unspecified site: Secondary | ICD-10-CM | POA: Insufficient documentation

## 2018-12-15 DIAGNOSIS — I10 Essential (primary) hypertension: Secondary | ICD-10-CM | POA: Insufficient documentation

## 2018-12-15 DIAGNOSIS — Z79899 Other long term (current) drug therapy: Secondary | ICD-10-CM | POA: Insufficient documentation

## 2018-12-15 DIAGNOSIS — Z7901 Long term (current) use of anticoagulants: Secondary | ICD-10-CM | POA: Insufficient documentation

## 2018-12-15 DIAGNOSIS — Z7902 Long term (current) use of antithrombotics/antiplatelets: Secondary | ICD-10-CM | POA: Diagnosis not present

## 2018-12-15 DIAGNOSIS — Z9861 Coronary angioplasty status: Secondary | ICD-10-CM

## 2018-12-15 DIAGNOSIS — I252 Old myocardial infarction: Secondary | ICD-10-CM | POA: Diagnosis not present

## 2018-12-15 DIAGNOSIS — E785 Hyperlipidemia, unspecified: Secondary | ICD-10-CM | POA: Insufficient documentation

## 2018-12-15 NOTE — Progress Notes (Signed)
Cardiac Individual Treatment Plan  Patient Details  Name: Melinda Maxwell MRN: [id number] Date of Birth: 1940-10-19 Referring Provider:     Cardiac Rehab from 12/15/2018 in [facility]iac [facility] Pul[facility] Re[facility]  Referring Provider  Frazier Richards MD      Initial Encounter Date:    Cardiac Rehab from 12/15/2018 in [facility] Cardiac and Pulmonary Rehab  Date  12/15/18      Visit Diagnosis: S/P PTCA (percutaneous transluminal coronary angioplasty)  Patient's Home Medications on Admission:  Current Outpatient Medications:  .  allopurinol (ZYLOPRIM) 300 MG tablet, Take 300 mg by mouth daily., Disp: , Rfl:  .  Cholecalciferol (VITAMIN D3) 50 MCG (2000 UT) capsule, Take by mouth., Disp: , Rfl:  .  clopidogrel (PLAVIX) 75 MG tablet, Take 1 tablet (75 mg total) by mouth daily with breakfast., Disp: 90 tablet, Rfl: 4 .  colchicine 0.6 MG tablet, Take by mouth., Disp: , Rfl:  .  diltiazem (CARDIZEM CD) 120 MG 24 hr capsule, Take 1 capsule (120 mg total) by mouth daily., Disp: , Rfl:  .  furosemide (LASIX) 40 MG tablet, Take 40 mg by mouth daily., Disp: , Rfl:  .  isosorbide mononitrate (IMDUR) 30 MG 24 hr tablet, Take 30 mg by mouth daily., Disp: , Rfl: 11 .  nitroGLYCERIN (NITROSTAT) 0.4 MG SL tablet, Place 0.4 mg under the tongue every 5 (five) minutes as needed for chest pain. , Disp: , Rfl: 0 .  nystatin (MYCOSTATIN/NYSTOP) powder, Apply 1 g topically daily as needed. areas or skinfolds for yeast, Disp: , Rfl: 3 .  pantoprazole (PROTONIX) 40 MG tablet, Take 40 mg by mouth daily. , Disp: , Rfl: 2 .  potassium chloride (K-DUR) 10 MEQ tablet, , Disp: , Rfl:  .  rivaroxaban (XARELTO) 20 MG TABS tablet, Take 1 tablet (20 mg total) by mouth daily., Disp: 30 tablet, Rfl:  .  simvastatin (ZOCOR) 10 MG tablet, Take 10 mg by mouth daily. HLD- reduced d/t interaction with Diltiazem- increased r/f Rhabdomyelosis, Disp: , Rfl:  .  metoprolol succinate (TOPROL XL) 25 MG 24 hr tablet, Take 1 tablet (25 mg total) by  mouth daily. (Patient not taking: Reported on 12/15/2018), Disp: , Rfl:  .  Naphazoline-Pheniramine (ALLERGY EYE OP), Place 1 drop into both eyes daily as needed (for dry eyes)., Disp: , Rfl:  .  spironolactone (ALDACTONE) 25 MG tablet, Take 25 mg by mouth daily. , Disp: , Rfl: 2 .  white petrolatum (VASELINE) GEL, Apply 1 application topically as needed (for eyes and face)., Disp: , Rfl:   Past Medical History: Past Medical History:  Diagnosis Date  . Arthritis   . Asthma without status asthmaticus    unspecified  . Breast cancer ([place])   . Coronary artery disease   . Diverticulosis   . DVT (deep venous thrombosis) ([place])   . Edema   . Gout   . Hyperlipidemia   . Hypertension   . MI, old   . Myocardial infarct ([place]) 2004  . Sleep apnea     Tobacco Use: Social History   Tobacco Use  Smoking Status Never Smoker  Smokeless Tobacco Never Used    Labs: Recent Review Flowsheet Data    There is no flowsheet data to display.       Exercise Target Goals: Exercise Program Goal: Individual exercise prescription set using results from initial 6 min walk test and THRR while considering  patient's activity barriers and safety.   Exercise Prescription Goal: Initial exercise prescription  builds to 30-45 minutes a day of aerobic activity, 2-3 days per week.  Home exercise guidelines will be given to patient during program as part of exercise prescription that the participant will acknowledge.  Activity Barriers & Risk Stratification: Activity Barriers & Cardiac Risk Stratification - 12/15/18 1306      Activity Barriers & Cardiac Risk Stratification   Activity Barriers  Arthritis;Shortness of Breath;Balance Concerns;Assistive Device;Deconditioning;Muscular Weakness;Other (comment)    Comments  synovial cyst, yeast infection in groin folds which effects gait    Cardiac Risk Stratification  High       6 Minute Walk: 6 Minute Walk    Row Name 12/15/18 1357         6 Minute Walk    Phase  Initial     Distance  475 feet     Walk Time  5.07 minutes     # of Rest Breaks  2 32 sec, 23 sec     MPH  1.06     METS  0.74     RPE  15     VO2 Peak  2.61     Symptoms  Yes (comment)     Comments  shuffling gait, unsteady, legs feel weak, dizziness, forward lean with gait, slight chest tightness at end     Resting HR  72 bpm     Resting BP  130/68     Resting Oxygen Saturation   100 %     Exercise Oxygen Saturation  during 6 min walk  100 %     Max Ex. HR  103 bpm     Max Ex. BP  138/72     2 Minute Post BP  126/64        Oxygen Initial Assessment:   Oxygen Re-Evaluation:   Oxygen Discharge (Final Oxygen Re-Evaluation):   Initial Exercise Prescription: Initial Exercise Prescription - 12/15/18 1400      Date of Initial Exercise RX and Referring Provider   Date  12/15/18    Referring Provider  Frazier Richards MD      Treadmill   MPH  0.8    Grade  0    Minutes  15    METs  1.6      NuStep   Level  4    SPM  80    Minutes  15    METs  1.5      Biostep-RELP   Level  1    SPM  50    Minutes  15    METs  1      Prescription Details   Frequency (times per week)  2    Duration  Progress to 30 minutes of continuous aerobic without signs/symptoms of physical distress      Intensity   THRR 40-80% of Max Heartrate  100-127    Ratings of Perceived Exertion  11-13    Perceived Dyspnea  0-4      Progression   Progression  Continue to progress workloads to maintain intensity without signs/symptoms of physical distress.      Resistance Training   Training Prescription  Yes    Weight  3 lbs    Reps  10-15       Perform Capillary Blood Glucose checks as needed.  Exercise Prescription Changes: Exercise Prescription Changes    Row Name 12/15/18 1300             Response to Exercise   Blood Pressure (Admit)  130/68  Blood Pressure (Exercise)  138/72       Blood Pressure (Exit)  126/64       Heart Rate (Admit)  72 bpm       Heart  Rate (Exercise)  103 bpm       Heart Rate (Exit)  69 bpm       Oxygen Saturation (Admit)  100 %       Oxygen Saturation (Exercise)  100 %       Rating of Perceived Exertion (Exercise)  15       Symptoms  shuffling gait, unsteady, forward lean, legs feel week, dizzy, slight chest pain       Comments  walk test results          Exercise Comments:   Exercise Goals and Review: Exercise Goals    Row Name 12/15/18 1415             Exercise Goals   Increase Physical Activity  Yes       Intervention  Provide advice, education, support and counseling about physical activity/exercise needs.;Develop an individualized exercise prescription for aerobic and resistive training based on initial evaluation findings, risk stratification, comorbidities and participant's personal goals.       Expected Outcomes  Short Term: Attend rehab on a regular basis to increase amount of physical activity.;Long Term: Add in home exercise to make exercise part of routine and to increase amount of physical activity.;Long Term: Exercising regularly at least 3-5 days a week.       Increase Strength and Stamina  Yes       Intervention  Provide advice, education, support and counseling about physical activity/exercise needs.;Develop an individualized exercise prescription for aerobic and resistive training based on initial evaluation findings, risk stratification, comorbidities and participant's personal goals.       Expected Outcomes  Short Term: Increase workloads from initial exercise prescription for resistance, speed, and METs.;Short Term: Perform resistance training exercises routinely during rehab and add in resistance training at home;Long Term: Improve cardiorespiratory fitness, muscular endurance and strength as measured by increased METs and functional capacity (6MWT)       Able to understand and use rate of perceived exertion (RPE) scale  Yes       Intervention  Provide education and explanation on how to use RPE  scale       Expected Outcomes  Long Term:  Able to use RPE to guide intensity level when exercising independently;Short Term: Able to use RPE daily in rehab to express subjective intensity level       Knowledge and understanding of Target Heart Rate Range (THRR)  Yes       Intervention  Provide education and explanation of THRR including how the numbers were predicted and where they are located for reference       Expected Outcomes  Short Term: Able to state/look up THRR;Short Term: Able to use daily as guideline for intensity in rehab;Long Term: Able to use THRR to govern intensity when exercising independently       Able to check pulse independently  Yes       Intervention  Provide education and demonstration on how to check pulse in carotid and radial arteries.;Review the importance of being able to check your own pulse for safety during independent exercise       Expected Outcomes  Short Term: Able to explain why pulse checking is important during independent exercise;Long Term: Able to check pulse independently and accurately  Understanding of Exercise Prescription  Yes       Intervention  Provide education, explanation, and written materials on patient's individual exercise prescription       Expected Outcomes  Short Term: Able to explain program exercise prescription;Long Term: Able to explain home exercise prescription to exercise independently          Exercise Goals Re-Evaluation :   Discharge Exercise Prescription (Final Exercise Prescription Changes): Exercise Prescription Changes - 12/15/18 1300      Response to Exercise   Blood Pressure (Admit)  130/68    Blood Pressure (Exercise)  138/72    Blood Pressure (Exit)  126/64    Heart Rate (Admit)  72 bpm    Heart Rate (Exercise)  103 bpm    Heart Rate (Exit)  69 bpm    Oxygen Saturation (Admit)  100 %    Oxygen Saturation (Exercise)  100 %    Rating of Perceived Exertion (Exercise)  15    Symptoms  shuffling gait,  unsteady, forward lean, legs feel week, dizzy, slight chest pain    Comments  walk test results       Nutrition:  Target Goals: Understanding of nutrition guidelines, daily intake of sodium '1500mg'$ , cholesterol '200mg'$ , calories 30% from fat and 7% or less from saturated fats, daily to have 5 or more servings of fruits and vegetables.  Biometrics: Pre Biometrics - 12/15/18 1416      Pre Biometrics   Height  5' 1.1" (1.552 m)    Weight  167 lb (75.8 kg)    Waist Circumference  36.5 inches    Hip Circumference  44 inches    Waist to Hip Ratio  0.83 %    BMI (Calculated)  31.45    Single Leg Stand  0 seconds        Nutrition Therapy Plan and Nutrition Goals: Nutrition Therapy & Goals - 12/15/18 1301      Intervention Plan   Intervention  Prescribe, educate and counsel regarding individualized specific dietary modifications aiming towards targeted core components such as weight, hypertension, lipid management, diabetes, heart failure and other comorbidities.;Nutrition handout(s) given to patient.    Expected Outcomes  Short Term Goal: Understand basic principles of dietary content, such as calories, fat, sodium, cholesterol and nutrients.;Short Term Goal: A plan has been developed with personal nutrition goals set during dietitian appointment.;Long Term Goal: Adherence to prescribed nutrition plan.       Nutrition Assessments: Nutrition Assessments - 12/15/18 1302      MEDFICTS Scores   Pre Score  48       Nutrition Goals Re-Evaluation:   Nutrition Goals Discharge (Final Nutrition Goals Re-Evaluation):   Psychosocial: Target Goals: Acknowledge presence or absence of significant depression and/or stress, maximize coping skills, provide positive support system. Participant is able to verbalize types and ability to use techniques and skills needed for reducing stress and depression.   Initial Review & Psychosocial Screening: Initial Psych Review & Screening - 12/15/18 1300       Initial Review   Current issues with  Current Stress Concerns    Source of Stress Concerns  Transportation;Unable to participate in former interests or hobbies    Comments  Jaysie relies on family and friend/care giver to take her places. Sometimes she feels bad asking them for help. She does have a good support system and people constantly checking up on her.       Family Dynamics   Good Support System?  Yes  family member, friend     Barriers   Psychosocial barriers to participate in program  There are no identifiable barriers or psychosocial needs.;The patient should benefit from training in stress management and relaxation.      Screening Interventions   Interventions  Encouraged to exercise;Program counselor consult;Provide feedback about the scores to participant;To provide support and resources with identified psychosocial needs    Expected Outcomes  Short Term goal: Utilizing psychosocial counselor, staff and physician to assist with identification of specific Stressors or current issues interfering with healing process. Setting desired goal for each stressor or current issue identified.;Long Term Goal: Stressors or current issues are controlled or eliminated.;Short Term goal: Identification and review with participant of any Quality of Life or Depression concerns found by scoring the questionnaire.;Long Term goal: The participant improves quality of Life and PHQ9 Scores as seen by post scores and/or verbalization of changes       Quality of Life Scores:  Quality of Life - 12/15/18 1041      Quality of Life   Select  Quality of Life      Quality of Life Scores   Health/Function Pre  18.3 %    Socioeconomic Pre  25.1 %    Psych/Spiritual Pre  24 %    Family Pre  22.5 %    GLOBAL Pre  21.23 %      Scores of 19 and below usually indicate a poorer quality of life in these areas.  A difference of  2-3 points is a clinically meaningful difference.  A difference of 2-3 points in  the total score of the Quality of Life Index has been associated with significant improvement in overall quality of life, self-image, physical symptoms, and general health in studies assessing change in quality of life.  PHQ-9: Recent Review Flowsheet Data    Depression screen [facility] 2/9 12/15/2018   Decreased Interest 0   Down, Depressed, Hopeless 0   PHQ - 2 Score 0   Altered sleeping 2   Tired, decreased energy 3   Change in appetite 0   Feeling bad or failure about yourself  0   Trouble concentrating 1   Moving slowly or fidgety/restless 2   Suicidal thoughts 0   PHQ-9 Score 8   Difficult doing work/chores Somewhat difficult     Interpretation of Total Score  Total Score Depression Severity:  1-4 = Minimal depression, 5-9 = Mild depression, 10-14 = Moderate depression, 15-19 = Moderately severe depression, 20-27 = Severe depression   Psychosocial Evaluation and Intervention:   Psychosocial Re-Evaluation:   Psychosocial Discharge (Final Psychosocial Re-Evaluation):   Vocational Rehabilitation: Provide vocational rehab assistance to qualifying candidates.   Vocational Rehab Evaluation & Intervention: Vocational Rehab - 12/15/18 1300      Initial Vocational Rehab Evaluation & Intervention   Assessment shows need for Vocational Rehabilitation  No       Education: Education Goals: Education classes will be provided on a variety of topics geared toward better understanding of heart health and risk factor modification. Participant will state understanding/return demonstration of topics presented as noted by education test scores.  Learning Barriers/Preferences: Learning Barriers/Preferences - 12/15/18 1258      Learning Barriers/Preferences   Learning Barriers  None    Learning Preferences  Individual Instruction       Education Topics:  AED/CPR: - Group verbal and written instruction with the use of models to demonstrate the basic use of the AED with the basic ABC's  of  resuscitation.   General Nutrition Guidelines/Fats and Fiber: -Group instruction provided by verbal, written material, models and posters to present the general guidelines for heart healthy nutrition. Gives an explanation and review of dietary fats and fiber.   Controlling Sodium/Reading Food Labels: -Group verbal and written material supporting the discussion of sodium use in heart healthy nutrition. Review and explanation with models, verbal and written materials for utilization of the food label.   Exercise Physiology & General Exercise Guidelines: - Group verbal and written instruction with models to review the exercise physiology of the cardiovascular system and associated critical values. Provides general exercise guidelines with specific guidelines to those with heart or lung disease.    Aerobic Exercise & Resistance Training: - Gives group verbal and written instruction on the various components of exercise. Focuses on aerobic and resistive training programs and the benefits of this training and how to safely progress through these programs..   Flexibility, Balance, Mind/Body Relaxation: Provides group verbal/written instruction on the benefits of flexibility and balance training, including mind/body exercise modes such as yoga, pilates and tai chi.  Demonstration and skill practice provided.   Stress and Anxiety: - Provides group verbal and written instruction about the health risks of elevated stress and causes of high stress.  Discuss the correlation between heart/lung disease and anxiety and treatment options. Review healthy ways to manage with stress and anxiety.   Depression: - Provides group verbal and written instruction on the correlation between heart/lung disease and depressed mood, treatment options, and the stigmas associated with seeking treatment.   Anatomy & Physiology of the Heart: - Group verbal and written instruction and models provide basic cardiac  anatomy and physiology, with the coronary electrical and arterial systems. Review of Valvular disease and Heart Failure   Cardiac Procedures: - Group verbal and written instruction to review commonly prescribed medications for heart disease. Reviews the medication, class of the drug, and side effects. Includes the steps to properly store meds and maintain the prescription regimen. (beta blockers and nitrates)   Cardiac Medications I: - Group verbal and written instruction to review commonly prescribed medications for heart disease. Reviews the medication, class of the drug, and side effects. Includes the steps to properly store meds and maintain the prescription regimen.   Cardiac Medications II: -Group verbal and written instruction to review commonly prescribed medications for heart disease. Reviews the medication, class of the drug, and side effects. (all other drug classes)    Go Sex-Intimacy & Heart Disease, Get SMART - Goal Setting: - Group verbal and written instruction through game format to discuss heart disease and the return to sexual intimacy. Provides group verbal and written material to discuss and apply goal setting through the application of the S.M.A.R.T. Method.   Other Matters of the Heart: - Provides group verbal, written materials and models to describe Stable Angina and Peripheral Artery. Includes description of the disease process and treatment options available to the cardiac patient.   Exercise & Equipment Safety: - Individual verbal instruction and demonstration of equipment use and safety with use of the equipment.   Cardiac Rehab from 12/15/2018 in [facility] Cardiac and Pulmonary Rehab  Date  12/15/18  Educator  [facility]  Instruction Review Code  1- Verbalizes Understanding      Infection Prevention: - Provides verbal and written material to individual with discussion of infection control including proper hand washing and proper equipment cleaning during exercise  session.   Cardiac Rehab from 12/15/2018 in [facility] Cardiac and Pulmonary  Rehab  Date  12/15/18  Educator  [place]  Instruction Review Code  1- Verbalizes Understanding      Falls Prevention: - Provides verbal and written material to individual with discussion of falls prevention and safety.   Cardiac Rehab from 12/15/2018 in [facility] Cardiac and Pulmonary Rehab  Date  12/15/18  Educator  [facility]  Instruction Review Code  1- Verbalizes Understanding      Diabetes: - Individual verbal and written instruction to review signs/symptoms of diabetes, desired ranges of glucose level fasting, after meals and with exercise. Acknowledge that pre and post exercise glucose checks will be done for 3 sessions at entry of program.   Know Your Numbers and Risk Factors: -Group verbal and written instruction about important numbers in your health.  Discussion of what are risk factors and how they play a role in the disease process.  Review of Cholesterol, Blood Pressure, Diabetes, and BMI and the role they play in your overall health.   Sleep Hygiene: -Provides group verbal and written instruction about how sleep can affect your health.  Define sleep hygiene, discuss sleep cycles and impact of sleep habits. Review good sleep hygiene tips.    Other: -Provides group and verbal instruction on various topics (see comments)   Knowledge Questionnaire Score: Knowledge Questionnaire Score - 12/15/18 1259      Knowledge Questionnaire Score   Pre Score  21/26   correct answers reviewed with Remo Lipps. Focus on nutrition and exercise      Core Components/Risk Factors/Patient Goals at Admission: Personal Goals and Risk Factors at Admission - 12/15/18 1256      Core Components/Risk Factors/Patient Goals on Admission    Weight Management  Yes;Weight Loss    Intervention  Weight Management: Develop a combined nutrition and exercise program designed to reach desired caloric intake, while maintaining appropriate intake of  nutrient and fiber, sodium and fats, and appropriate energy expenditure required for the weight goal.;Weight Management: Provide education and appropriate resources to help participant work on and attain dietary goals.;Weight Management/Obesity: Establish reasonable short term and long term weight goals.    Admit Weight  167 lb (75.8 kg)    Goal Weight: Short Term  163 lb (73.9 kg)    Goal Weight: Long Term  150 lb (68 kg)    Expected Outcomes  Short Term: Continue to assess and modify interventions until short term weight is achieved;Long Term: Adherence to nutrition and physical activity/exercise program aimed toward attainment of established weight goal;Weight Loss: Understanding of general recommendations for a balanced deficit meal plan, which promotes 1-2 lb weight loss per week and includes a negative energy balance of [phone] kcal/d;Understanding recommendations for meals to include 15-35% energy as protein, 25-35% energy from fat, 35-60% energy from carbohydrates, less than '200mg'$  of dietary cholesterol, 20-35 gm of total fiber daily;Understanding of distribution of calorie intake throughout the day with the consumption of 4-5 meals/snacks    Hypertension  Yes    Intervention  Provide education on lifestyle modifcations including regular physical activity/exercise, weight management, moderate sodium restriction and increased consumption of fresh fruit, vegetables, and low fat dairy, alcohol moderation, and smoking cessation.;Monitor prescription use compliance.    Expected Outcomes  Short Term: Continued assessment and intervention until BP is < 140/81m HG in hypertensive participants. < 130/844mHG in hypertensive participants with diabetes, heart failure or chronic kidney disease.;Long Term: Maintenance of blood pressure at goal levels.    Lipids  Yes    Intervention  Provide education and support  for participant on nutrition & aerobic/resistive exercise along with prescribed medications to  achieve LDL '70mg'$ , HDL >'40mg'$ .    Expected Outcomes  Short Term: Participant states understanding of desired cholesterol values and is compliant with medications prescribed. Participant is following exercise prescription and nutrition guidelines.;Long Term: Cholesterol controlled with medications as prescribed, with individualized exercise RX and with personalized nutrition plan. Value goals: LDL < '70mg'$ , HDL > 40 mg.       Core Components/Risk Factors/Patient Goals Review:    Core Components/Risk Factors/Patient Goals at Discharge (Final Review):    ITP Comments: ITP Comments    Row Name 12/15/18 1228           ITP Comments  Med Review completed. Initial ITP created. Diagnosis can be found in [facility] 02/11/18          Comments: Initial ITP

## 2018-12-15 NOTE — Patient Instructions (Signed)
Patient Instructions  Patient Details  Name: Melinda Maxwell MRN: [id number] Date of Birth: 08-13-1941 Referring Provider:  [name], MD  Below are your personal goals for exercise, nutrition, and risk factors. Our goal is to help you stay on track towards obtaining and maintaining these goals. We will be discussing your progress on these goals with you throughout the program.  Initial Exercise Prescription: Initial Exercise Prescription - 12/15/18 1400      Date of Initial Exercise RX and Referring Provider   Date  12/15/18    Referring Provider  Frazier Richards MD      Treadmill   MPH  0.8    Grade  0    Minutes  15    METs  1.6      NuStep   Level  4    SPM  80    Minutes  15    METs  1.5      Biostep-RELP   Level  1    SPM  50    Minutes  15    METs  1      Prescription Details   Frequency (times per week)  2    Duration  Progress to 30 minutes of continuous aerobic without signs/symptoms of physical distress      Intensity   THRR 40-80% of Max Heartrate  100-127    Ratings of Perceived Exertion  11-13    Perceived Dyspnea  0-4      Progression   Progression  Continue to progress workloads to maintain intensity without signs/symptoms of physical distress.      Resistance Training   Training Prescription  Yes    Weight  3 lbs    Reps  10-15       Exercise Goals: Frequency: Be able to perform aerobic exercise two to three times per week in program working toward 2-5 days per week of home exercise.  Intensity: Work with a perceived exertion of 11 (fairly light) - 15 (hard) while following your exercise prescription.  We will make changes to your prescription with you as you progress through the program.   Duration: Be able to do 30 to 45 minutes of continuous aerobic exercise in addition to a 5 minute warm-up and a 5 minute cool-down routine.   Nutrition Goals: Your personal nutrition goals will be established when you do your nutrition  analysis with the dietician.  The following are general nutrition guidelines to follow: Cholesterol < 200mg /day Sodium < 1500mg /day Fiber: Women over 50 yrs - 21 grams per day  Personal Goals: Personal Goals and Risk Factors at Admission - 12/15/18 1256      Core Components/Risk Factors/Patient Goals on Admission    Weight Management  Yes;Weight Loss    Intervention  Weight Management: Develop a combined nutrition and exercise program designed to reach desired caloric intake, while maintaining appropriate intake of nutrient and fiber, sodium and fats, and appropriate energy expenditure required for the weight goal.;Weight Management: Provide education and appropriate resources to help participant work on and attain dietary goals.;Weight Management/Obesity: Establish reasonable short term and long term weight goals.    Admit Weight  167 lb (75.8 kg)    Goal Weight: Short Term  163 lb (73.9 kg)    Goal Weight: Long Term  150 lb (68 kg)    Expected Outcomes  Short Term: Continue to assess and modify interventions until short term weight is achieved;Long Term: Adherence to nutrition and physical activity/exercise program aimed  toward attainment of established weight goal;Weight Loss: Understanding of general recommendations for a balanced deficit meal plan, which promotes 1-2 lb weight loss per week and includes a negative energy balance of [phone] kcal/d;Understanding recommendations for meals to include 15-35% energy as protein, 25-35% energy from fat, 35-60% energy from carbohydrates, less than 200mg  of dietary cholesterol, 20-35 gm of total fiber daily;Understanding of distribution of calorie intake throughout the day with the consumption of 4-5 meals/snacks    Hypertension  Yes    Intervention  Provide education on lifestyle modifcations including regular physical activity/exercise, weight management, moderate sodium restriction and increased consumption of fresh fruit, vegetables, and low fat  dairy, alcohol moderation, and smoking cessation.;Monitor prescription use compliance.    Expected Outcomes  Short Term: Continued assessment and intervention until BP is < 140/32mm HG in hypertensive participants. < 130/58mm HG in hypertensive participants with diabetes, heart failure or chronic kidney disease.;Long Term: Maintenance of blood pressure at goal levels.    Lipids  Yes    Intervention  Provide education and support for participant on nutrition & aerobic/resistive exercise along with prescribed medications to achieve LDL 70mg , HDL >40mg .    Expected Outcomes  Short Term: Participant states understanding of desired cholesterol values and is compliant with medications prescribed. Participant is following exercise prescription and nutrition guidelines.;Long Term: Cholesterol controlled with medications as prescribed, with individualized exercise RX and with personalized nutrition plan. Value goals: LDL < 70mg , HDL > 40 mg.       Tobacco Use Initial Evaluation: Social History   Tobacco Use  Smoking Status Never Smoker  Smokeless Tobacco Never Used    Exercise Goals and Review: Exercise Goals    Row Name 12/15/18 1415             Exercise Goals   Increase Physical Activity  Yes       Intervention  Provide advice, education, support and counseling about physical activity/exercise needs.;Develop an individualized exercise prescription for aerobic and resistive training based on initial evaluation findings, risk stratification, comorbidities and participant's personal goals.       Expected Outcomes  Short Term: Attend rehab on a regular basis to increase amount of physical activity.;Long Term: Add in home exercise to make exercise part of routine and to increase amount of physical activity.;Long Term: Exercising regularly at least 3-5 days a week.       Increase Strength and Stamina  Yes       Intervention  Provide advice, education, support and counseling about physical  activity/exercise needs.;Develop an individualized exercise prescription for aerobic and resistive training based on initial evaluation findings, risk stratification, comorbidities and participant's personal goals.       Expected Outcomes  Short Term: Increase workloads from initial exercise prescription for resistance, speed, and METs.;Short Term: Perform resistance training exercises routinely during rehab and add in resistance training at home;Long Term: Improve cardiorespiratory fitness, muscular endurance and strength as measured by increased METs and functional capacity (6MWT)       Able to understand and use rate of perceived exertion (RPE) scale  Yes       Intervention  Provide education and explanation on how to use RPE scale       Expected Outcomes  Long Term:  Able to use RPE to guide intensity level when exercising independently;Short Term: Able to use RPE daily in rehab to express subjective intensity level       Knowledge and understanding of Target Heart Rate Range (THRR)  Yes  Intervention  Provide education and explanation of THRR including how the numbers were predicted and where they are located for reference       Expected Outcomes  Short Term: Able to state/look up THRR;Short Term: Able to use daily as guideline for intensity in rehab;Long Term: Able to use THRR to govern intensity when exercising independently       Able to check pulse independently  Yes       Intervention  Provide education and demonstration on how to check pulse in carotid and radial arteries.;Review the importance of being able to check your own pulse for safety during independent exercise       Expected Outcomes  Short Term: Able to explain why pulse checking is important during independent exercise;Long Term: Able to check pulse independently and accurately       Understanding of Exercise Prescription  Yes       Intervention  Provide education, explanation, and written materials on patient's individual  exercise prescription       Expected Outcomes  Short Term: Able to explain program exercise prescription;Long Term: Able to explain home exercise prescription to exercise independently          Copy of goals given to participant.

## 2018-12-15 NOTE — Progress Notes (Signed)
Daily Session Note  Patient Details  Name: Melinda Maxwell MRN: [id number] Date of Birth: [birth year] Referring Provider:     Cardiac Rehab from 12/15/2018 in [facility] Cardiac [facility]  Referring Provider  Frazier Richards MD      Encounter Date: 12/15/2018  Check In: Session Check In - 12/15/18 1226      Check-In   Supervising physician immediately available to respond to emergencies  See telemetry face sheet for immediately available ER MD    Location  [facility]-Cardiac & Pulmonary Rehab    Staff Present  Renita Papa, RN BSN;Susanne Bice, RN, BSN, CCRP;Jessica Seagraves, MA, RCEP, CCRP, Exercise Physiologist    Medication changes reported      No    Fall or balance concerns reported     No    Warm-up and Cool-down  Not performed (comment)   med review completed   Resistance Training Performed  Yes    VAD Patient?  No    PAD/SET Patient?  No      Pain Assessment   Currently in Pain?  No/denies        Exercise Prescription Changes - 12/15/18 1300      Response to Exercise   Blood Pressure (Admit)  130/68    Blood Pressure (Exercise)  138/72    Blood Pressure (Exit)  126/64    Heart Rate (Admit)  72 bpm    Heart Rate (Exercise)  103 bpm    Heart Rate (Exit)  69 bpm    Oxygen Saturation (Admit)  100 %    Oxygen Saturation (Exercise)  100 %    Rating of Perceived Exertion (Exercise)  15    Symptoms  shuffling gait, unsteady, forward lean, legs feel week, dizzy, slight chest pain    Comments  walk test results       Social History   Tobacco Use  Smoking Status Never Smoker  Smokeless Tobacco Never Used    Goals Met:  Exercise tolerated well Personal goals reviewed Strength training completed today  Goals Unmet:  Not Applicable  Comments: Medical evaluation and 6MWT completed today    Dr. Emily Filbert is Medical Director for [facility].

## 2018-12-18 ENCOUNTER — Other Ambulatory Visit: Payer: Self-pay

## 2018-12-18 ENCOUNTER — Encounter: Payer: Medicare Other | Admitting: *Deleted

## 2018-12-18 DIAGNOSIS — Z9861 Coronary angioplasty status: Secondary | ICD-10-CM

## 2018-12-18 DIAGNOSIS — I2510 Atherosclerotic heart disease of native coronary artery without angina pectoris: Secondary | ICD-10-CM | POA: Diagnosis not present

## 2018-12-18 NOTE — Progress Notes (Signed)
Daily Session Note  Patient Details  Name: Melinda Maxwell MRN: [id number] Date of Birth: Feb 10, 1941 Referring Provider:     Cardiac Rehab from 12/15/2018 in [facility] Cardiac [facility] Pul[facility] Rehab  Referring Provider  Frazier Richards MD      Encounter Date: 12/18/2018  Check In: Session Check In - 12/18/18 1022      Check-In   Supervising physician immediately available to respond to emergencies  See telemetry face sheet for immediately available ER MD    Location  [facility]-Cardiac & Pulmonary Rehab    Staff Present  Heath Lark, RN, BSN, CCRP;Jeanna Durrell BS, Exercise Physiologist;Aric Jost [place], MA, RCEP, CCRP, Exercise Physiologist    Medication changes reported      No    Fall or balance concerns reported     No    Warm-up and Cool-down  Performed as group-led instruction    Resistance Training Performed  Yes    VAD Patient?  No    PAD/SET Patient?  No      Pain Assessment   Currently in Pain?  No/denies          Social History   Tobacco Use  Smoking Status Never Smoker  Smokeless Tobacco Never Used    Goals Met:  Exercise tolerated well Personal goals reviewed No report of cardiac concerns or symptoms Strength training completed today  Goals Unmet:  Not Applicable  Comments: First full day of exercise!  Patient was oriented to gym and equipment including functions, settings, policies, and procedures.  Patient's individual exercise prescription and treatment plan were reviewed.  All starting workloads were established based on the results of the 6 minute walk test done at initial orientation visit.  The plan for exercise progression was also introduced and progression will be customized based on patient's performance and goals.    Dr. Emily Filbert is Medical Director for [facility].

## 2018-12-31 ENCOUNTER — Encounter: Payer: Self-pay | Admitting: *Deleted

## 2018-12-31 DIAGNOSIS — Z9861 Coronary angioplasty status: Secondary | ICD-10-CM

## 2018-12-31 NOTE — Progress Notes (Signed)
Cardiac Individual Treatment Plan  Patient Details  Name: Melinda Maxwell MRN: [id number] Date of Birth: 11/25/1940 Referring Provider:     Cardiac Rehab from 12/15/2018 in [facility] Cardiac and Pulmonary Rehab  Referring Provider  Frazier Richards MD      Initial Encounter Date:    Cardiac Rehab from 12/15/2018 in [facility] Cardiac and Pulmonary Rehab  Date  12/15/18      Visit Diagnosis: S/P PTCA (percutaneous transluminal coronary angioplasty)  Patient's Home Medications on Admission:  Current Outpatient Medications:  .  allopurinol (ZYLOPRIM) 300 MG tablet, Take 300 mg by mouth daily., Disp: , Rfl:  .  Cholecalciferol (VITAMIN D3) 50 MCG (2000 UT) capsule, Take by mouth., Disp: , Rfl:  .  clopidogrel (PLAVIX) 75 MG tablet, Take 1 tablet (75 mg total) by mouth daily with breakfast., Disp: 90 tablet, Rfl: 4 .  colchicine 0.6 MG tablet, Take by mouth., Disp: , Rfl:  .  diltiazem (CARDIZEM CD) 120 MG 24 hr capsule, Take 1 capsule (120 mg total) by mouth daily., Disp: , Rfl:  .  furosemide (LASIX) 40 MG tablet, Take 40 mg by mouth daily., Disp: , Rfl:  .  isosorbide mononitrate (IMDUR) 30 MG 24 hr tablet, Take 30 mg by mouth daily., Disp: , Rfl: 11 .  metoprolol succinate (TOPROL XL) 25 MG 24 hr tablet, Take 1 tablet (25 mg total) by mouth daily. (Patient not taking: Reported on 12/15/2018), Disp: , Rfl:  .  Naphazoline-Pheniramine (ALLERGY EYE OP), Place 1 drop into both eyes daily as needed (for dry eyes)., Disp: , Rfl:  .  nitroGLYCERIN (NITROSTAT) 0.4 MG SL tablet, Place 0.4 mg under the tongue every 5 (five) minutes as needed for chest pain. , Disp: , Rfl: 0 .  nystatin (MYCOSTATIN/NYSTOP) powder, Apply 1 g topically daily as needed. areas or skinfolds for yeast, Disp: , Rfl: 3 .  pantoprazole (PROTONIX) 40 MG tablet, Take 40 mg by mouth daily. , Disp: , Rfl: 2 .  potassium chloride (K-DUR) 10 MEQ tablet, , Disp: , Rfl:  .  rivaroxaban (XARELTO) 20 MG TABS tablet, Take 1 tablet (20 mg total) by  mouth daily., Disp: 30 tablet, Rfl:  .  simvastatin (ZOCOR) 10 MG tablet, Take 10 mg by mouth daily. HLD- reduced d/t interaction with Diltiazem- increased r/f Rhabdomyelosis, Disp: , Rfl:  .  spironolactone (ALDACTONE) 25 MG tablet, Take 25 mg by mouth daily. , Disp: , Rfl: 2 .  white petrolatum (VASELINE) GEL, Apply 1 application topically as needed (for eyes and face)., Disp: , Rfl:   Past Medical History: Past Medical History:  Diagnosis Date  . Arthritis   . Asthma without status asthmaticus    unspecified  . Breast cancer ([place])   . Coronary artery disease   . Diverticulosis   . DVT (deep venous thrombosis) ([place])   . Edema   . Gout   . Hyperlipidemia   . Hypertension   . MI, old   . Myocardial infarct ([place]) 2004  . Sleep apnea     Tobacco Use: Social History   Tobacco Use  Smoking Status Never Smoker  Smokeless Tobacco Never Used    Labs: Recent Review Flowsheet Data    There is no flowsheet data to display.       Exercise Target Goals: Exercise Program Goal: Individual exercise prescription set using results from initial 6 min walk test and THRR while considering  patient's activity barriers and safety.   Exercise Prescription Goal: Initial exercise prescription  builds to 30-45 minutes a day of aerobic activity, 2-3 days per week.  Home exercise guidelines will be given to patient during program as part of exercise prescription that the participant will acknowledge.  Activity Barriers & Risk Stratification: Activity Barriers & Cardiac Risk Stratification - 12/15/18 1306      Activity Barriers & Cardiac Risk Stratification   Activity Barriers  Arthritis;Shortness of Breath;Balance Concerns;Assistive Device;Deconditioning;Muscular Weakness;Other (comment)    Comments  synovial cyst, yeast infection in groin folds which effects gait    Cardiac Risk Stratification  High       6 Minute Walk: 6 Minute Walk    Row Name 12/15/18 1357         6 Minute Walk    Phase  Initial     Distance  475 feet     Walk Time  5.07 minutes     # of Rest Breaks  2 32 sec, 23 sec     MPH  1.06     METS  0.74     RPE  15     VO2 Peak  2.61     Symptoms  Yes (comment)     Comments  shuffling gait, unsteady, legs feel weak, dizziness, forward lean with gait, slight chest tightness at end     Resting HR  72 bpm     Resting BP  130/68     Resting Oxygen Saturation   100 %     Exercise Oxygen Saturation  during 6 min walk  100 %     Max Ex. HR  103 bpm     Max Ex. BP  138/72     2 Minute Post BP  126/64        Oxygen Initial Assessment:   Oxygen Re-Evaluation:   Oxygen Discharge (Final Oxygen Re-Evaluation):   Initial Exercise Prescription: Initial Exercise Prescription - 12/15/18 1400      Date of Initial Exercise RX and Referring Provider   Date  12/15/18    Referring Provider  Frazier Richards MD      Treadmill   MPH  0.8    Grade  0    Minutes  15    METs  1.6      NuStep   Level  4    SPM  80    Minutes  15    METs  1.5      Biostep-RELP   Level  1    SPM  50    Minutes  15    METs  1      Prescription Details   Frequency (times per week)  2    Duration  Progress to 30 minutes of continuous aerobic without signs/symptoms of physical distress      Intensity   THRR 40-80% of Max Heartrate  100-127    Ratings of Perceived Exertion  11-13    Perceived Dyspnea  0-4      Progression   Progression  Continue to progress workloads to maintain intensity without signs/symptoms of physical distress.      Resistance Training   Training Prescription  Yes    Weight  3 lbs    Reps  10-15       Perform Capillary Blood Glucose checks as needed.  Exercise Prescription Changes: Exercise Prescription Changes    Row Name 12/15/18 1300 12/24/18 0900           Response to Exercise   Blood Pressure (Admit)  130/68  118/72      Blood Pressure (Exercise)  138/72  140/70      Blood Pressure (Exit)  126/64  132/70      Heart  Rate (Admit)  72 bpm  47 bpm      Heart Rate (Exercise)  103 bpm  88 bpm      Heart Rate (Exit)  69 bpm  58 bpm      Oxygen Saturation (Admit)  100 %  -      Oxygen Saturation (Exercise)  100 %  -      Rating of Perceived Exertion (Exercise)  15  11      Symptoms  shuffling gait, unsteady, forward lean, legs feel week, dizzy, slight chest pain  fatigue      Comments  walk test results  first full day of exercise      Duration  -  Progress to 30 minutes of  aerobic without signs/symptoms of physical distress      Intensity  -  THRR unchanged        Progression   Progression  -  Continue to progress workloads to maintain intensity without signs/symptoms of physical distress.      Average METs  -  1.1        Resistance Training   Training Prescription  -  Yes      Weight  -  3 lbs      Reps  -  10-15        Interval Training   Interval Training  -  No        NuStep   Level  -  1      Minutes  -  15      METs  -  1.1        Biostep-RELP   Level  -  1      Minutes  -  15      METs  -  1        Home Exercise Plan   Plans to continue exercise at  -  Home (comment) walking      Frequency  -  Add 2 additional days to program exercise sessions.      Initial Home Exercises Provided  -  12/23/18 sent in mail         Exercise Comments: Exercise Comments    Row Name 12/18/18 1023           Exercise Comments   First full day of exercise!  Patient was oriented to gym and equipment including functions, settings, policies, and procedures.  Patient's individual exercise prescription and treatment plan were reviewed.  All starting workloads were established based on the results of the 6 minute walk test done at initial orientation visit.  The plan for exercise progression was also introduced and progression will be customized based on patient's performance and goals.          Exercise Goals and Review: Exercise Goals    Row Name 12/15/18 1415             Exercise Goals    Increase Physical Activity  Yes       Intervention  Provide advice, education, support and counseling about physical activity/exercise needs.;Develop an individualized exercise prescription for aerobic and resistive training based on initial evaluation findings, risk stratification, comorbidities and participant's personal goals.       Expected Outcomes  Short Term: Attend rehab on a regular basis to increase  amount of physical activity.;Long Term: Add in home exercise to make exercise part of routine and to increase amount of physical activity.;Long Term: Exercising regularly at least 3-5 days a week.       Increase Strength and Stamina  Yes       Intervention  Provide advice, education, support and counseling about physical activity/exercise needs.;Develop an individualized exercise prescription for aerobic and resistive training based on initial evaluation findings, risk stratification, comorbidities and participant's personal goals.       Expected Outcomes  Short Term: Increase workloads from initial exercise prescription for resistance, speed, and METs.;Short Term: Perform resistance training exercises routinely during rehab and add in resistance training at home;Long Term: Improve cardiorespiratory fitness, muscular endurance and strength as measured by increased METs and functional capacity (6MWT)       Able to understand and use rate of perceived exertion (RPE) scale  Yes       Intervention  Provide education and explanation on how to use RPE scale       Expected Outcomes  Long Term:  Able to use RPE to guide intensity level when exercising independently;Short Term: Able to use RPE daily in rehab to express subjective intensity level       Knowledge and understanding of Target Heart Rate Range (THRR)  Yes       Intervention  Provide education and explanation of THRR including how the numbers were predicted and where they are located for reference       Expected Outcomes  Short Term: Able to  state/look up THRR;Short Term: Able to use daily as guideline for intensity in rehab;Long Term: Able to use THRR to govern intensity when exercising independently       Able to check pulse independently  Yes       Intervention  Provide education and demonstration on how to check pulse in carotid and radial arteries.;Review the importance of being able to check your own pulse for safety during independent exercise       Expected Outcomes  Short Term: Able to explain why pulse checking is important during independent exercise;Long Term: Able to check pulse independently and accurately       Understanding of Exercise Prescription  Yes       Intervention  Provide education, explanation, and written materials on patient's individual exercise prescription       Expected Outcomes  Short Term: Able to explain program exercise prescription;Long Term: Able to explain home exercise prescription to exercise independently          Exercise Goals Re-Evaluation : Exercise Goals Re-Evaluation    Row Name 12/18/18 1023 12/24/18 0859 12/26/18 1333         Exercise Goal Re-Evaluation   Exercise Goals Review  Increase Physical Activity;Increase Strength and Stamina;Able to understand and use rate of perceived exertion (RPE) scale;Knowledge and understanding of Target Heart Rate Range (THRR);Understanding of Exercise Prescription  Increase Physical Activity;Increase Strength and Stamina;Understanding of Exercise Prescription  Increase Physical Activity;Increase Strength and Stamina;Understanding of Exercise Prescription     Comments  Reviewed RPE scale, THR and program prescription with pt today.  Pt voiced understanding and was given a copy of goals to take home.   Elizebeth has completed her first full day of exercise.  She does not have access to email so we have sent out her home exercise guidelines in the mail and will do a follow up phone calls.  We will continue to monitor her progress at home.  Aryana is still  waiting on her packet.  She is eager to get started and has started to walk some already.  She says she will call if she has any questions.      Expected Outcomes  Short: Use RPE daily to regulate intensity. Long: Follow program prescription in THR.  Short: Start to exercise by walking at home. Long: Continue to increase activity levels.   Short: Continue to walk at home.  Long: Continue to increase activity levels.         Discharge Exercise Prescription (Final Exercise Prescription Changes): Exercise Prescription Changes - 12/24/18 0900      Response to Exercise   Blood Pressure (Admit)  118/72    Blood Pressure (Exercise)  140/70    Blood Pressure (Exit)  132/70    Heart Rate (Admit)  47 bpm    Heart Rate (Exercise)  88 bpm    Heart Rate (Exit)  58 bpm    Rating of Perceived Exertion (Exercise)  11    Symptoms  fatigue    Comments  first full day of exercise    Duration  Progress to 30 minutes of  aerobic without signs/symptoms of physical distress    Intensity  THRR unchanged      Progression   Progression  Continue to progress workloads to maintain intensity without signs/symptoms of physical distress.    Average METs  1.1      Resistance Training   Training Prescription  Yes    Weight  3 lbs    Reps  10-15      Interval Training   Interval Training  No      NuStep   Level  1    Minutes  15    METs  1.1      Biostep-RELP   Level  1    Minutes  15    METs  1      Home Exercise Plan   Plans to continue exercise at  Home (comment)   walking   Frequency  Add 2 additional days to program exercise sessions.    Initial Home Exercises Provided  12/23/18   sent in mail      Nutrition:  Target Goals: Understanding of nutrition guidelines, daily intake of sodium '1500mg'$ , cholesterol '200mg'$ , calories 30% from fat and 7% or less from saturated fats, daily to have 5 or more servings of fruits and vegetables.  Biometrics: Pre Biometrics - 12/15/18 1416      Pre  Biometrics   Height  5' 1.1" (1.552 m)    Weight  167 lb (75.8 kg)    Waist Circumference  36.5 inches    Hip Circumference  44 inches    Waist to Hip Ratio  0.83 %    BMI (Calculated)  31.45    Single Leg Stand  0 seconds        Nutrition Therapy Plan and Nutrition Goals: Nutrition Therapy & Goals - 12/15/18 1301      Intervention Plan   Intervention  Prescribe, educate and counsel regarding individualized specific dietary modifications aiming towards targeted core components such as weight, hypertension, lipid management, diabetes, heart failure and other comorbidities.;Nutrition handout(s) given to patient.    Expected Outcomes  Short Term Goal: Understand basic principles of dietary content, such as calories, fat, sodium, cholesterol and nutrients.;Short Term Goal: A plan has been developed with personal nutrition goals set during dietitian appointment.;Long Term Goal: Adherence to prescribed nutrition plan.  Nutrition Assessments: Nutrition Assessments - 12/15/18 1302      MEDFICTS Scores   Pre Score  48       Nutrition Goals Re-Evaluation:   Nutrition Goals Discharge (Final Nutrition Goals Re-Evaluation):   Psychosocial: Target Goals: Acknowledge presence or absence of significant depression and/or stress, maximize coping skills, provide positive support system. Participant is able to verbalize types and ability to use techniques and skills needed for reducing stress and depression.   Initial Review & Psychosocial Screening: Initial Psych Review & Screening - 12/15/18 1300      Initial Review   Current issues with  Current Stress Concerns    Source of Stress Concerns  Transportation;Unable to participate in former interests or hobbies    Comments  Mariadelaluz relies on family and friend/care giver to take her places. Sometimes she feels bad asking them for help. She does have a good support system and people constantly checking up on her.       Family Dynamics    Good Support System?  Yes   family member, friend     Barriers   Psychosocial barriers to participate in program  There are no identifiable barriers or psychosocial needs.;The patient should benefit from training in stress management and relaxation.      Screening Interventions   Interventions  Encouraged to exercise;Program counselor consult;Provide feedback about the scores to participant;To provide support and resources with identified psychosocial needs    Expected Outcomes  Short Term goal: Utilizing psychosocial counselor, staff and physician to assist with identification of specific Stressors or current issues interfering with healing process. Setting desired goal for each stressor or current issue identified.;Long Term Goal: Stressors or current issues are controlled or eliminated.;Short Term goal: Identification and review with participant of any Quality of Life or Depression concerns found by scoring the questionnaire.;Long Term goal: The participant improves quality of Life and PHQ9 Scores as seen by post scores and/or verbalization of changes       Quality of Life Scores:  Quality of Life - 12/15/18 1041      Quality of Life   Select  Quality of Life      Quality of Life Scores   Health/Function Pre  18.3 %    Socioeconomic Pre  25.1 %    Psych/Spiritual Pre  24 %    Family Pre  22.5 %    GLOBAL Pre  21.23 %      Scores of 19 and below usually indicate a poorer quality of life in these areas.  A difference of  2-3 points is a clinically meaningful difference.  A difference of 2-3 points in the total score of the Quality of Life Index has been associated with significant improvement in overall quality of life, self-image, physical symptoms, and general health in studies assessing change in quality of life.  PHQ-9: Recent Review Flowsheet Data    Depression screen [facility] 2/9 12/15/2018   Decreased Interest 0   Down, Depressed, Hopeless 0   PHQ - 2 Score 0   Altered sleeping 2    Tired, decreased energy 3   Change in appetite 0   Feeling bad or failure about yourself  0   Trouble concentrating 1   Moving slowly or fidgety/restless 2   Suicidal thoughts 0   PHQ-9 Score 8   Difficult doing work/chores Somewhat difficult     Interpretation of Total Score  Total Score Depression Severity:  1-4 = Minimal depression, 5-9 = Mild depression, 10-14 =  Moderate depression, 15-19 = Moderately severe depression, 20-27 = Severe depression   Psychosocial Evaluation and Intervention: Psychosocial Evaluation - 12/18/18 1041      Psychosocial Evaluation & Interventions   Interventions  Encouraged to exercise with the program and follow exercise prescription    Comments  Counselor met with patient and caregiver Vaughan Basta.  Patient reported having two stents placed May 2019 and having had 'dizzy spells' since them.  Per patient and caregiver report, they asked physician about resources for patient to improve ability to [facility] and walk with more confidence which led them to this program.  Patient reports having a fear of falling due to lack of stability and having a history of falling before.  Support system includes her sister, brother in Sports coach, caregiver, daughter [name], and extended family and church support.  Patient does live alone.  No health concerns to report, feels that she sleeps and eats well.  Reported history of sleep apnea yet not currently using a machine.    Expected Outcomes  Short: increase exercise; long: improve balance and confidence in walking    Continue Psychosocial Services   Follow up required by staff       Psychosocial Re-Evaluation:   Psychosocial Discharge (Final Psychosocial Re-Evaluation):   Vocational Rehabilitation: Provide vocational rehab assistance to qualifying candidates.   Vocational Rehab Evaluation & Intervention: Vocational Rehab - 12/15/18 1300      Initial Vocational Rehab Evaluation & Intervention   Assessment shows need for  Vocational Rehabilitation  No       Education: Education Goals: Education classes will be provided on a variety of topics geared toward better understanding of heart health and risk factor modification. Participant will state understanding/return demonstration of topics presented as noted by education test scores.  Learning Barriers/Preferences: Learning Barriers/Preferences - 12/15/18 1258      Learning Barriers/Preferences   Learning Barriers  None    Learning Preferences  Individual Instruction       Education Topics:  AED/CPR: - Group verbal and written instruction with the use of models to demonstrate the basic use of the AED with the basic ABC's of resuscitation.   General Nutrition Guidelines/Fats and Fiber: -Group instruction provided by verbal, written material, models and posters to present the general guidelines for heart healthy nutrition. Gives an explanation and review of dietary fats and fiber.   Controlling Sodium/Reading Food Labels: -Group verbal and written material supporting the discussion of sodium use in heart healthy nutrition. Review and explanation with models, verbal and written materials for utilization of the food label.   Cardiac Rehab from 12/18/2018 in [facility] Cardiac and Pulmonary Rehab  Date  12/18/18  Educator  [facility]  Instruction Review Code  1- Verbalizes Understanding      Exercise Physiology & General Exercise Guidelines: - Group verbal and written instruction with models to review the exercise physiology of the cardiovascular system and associated critical values. Provides general exercise guidelines with specific guidelines to those with heart or lung disease.    Aerobic Exercise & Resistance Training: - Gives group verbal and written instruction on the various components of exercise. Focuses on aerobic and resistive training programs and the benefits of this training and how to safely progress through these programs..   Flexibility,  Balance, Mind/Body Relaxation: Provides group verbal/written instruction on the benefits of flexibility and balance training, including mind/body exercise modes such as yoga, pilates and tai chi.  Demonstration and skill practice provided.   Stress and Anxiety: - Provides group verbal  and written instruction about the health risks of elevated stress and causes of high stress.  Discuss the correlation between heart/lung disease and anxiety and treatment options. Review healthy ways to manage with stress and anxiety.   Depression: - Provides group verbal and written instruction on the correlation between heart/lung disease and depressed mood, treatment options, and the stigmas associated with seeking treatment.   Anatomy & Physiology of the Heart: - Group verbal and written instruction and models provide basic cardiac anatomy and physiology, with the coronary electrical and arterial systems. Review of Valvular disease and Heart Failure   Cardiac Procedures: - Group verbal and written instruction to review commonly prescribed medications for heart disease. Reviews the medication, class of the drug, and side effects. Includes the steps to properly store meds and maintain the prescription regimen. (beta blockers and nitrates)   Cardiac Medications I: - Group verbal and written instruction to review commonly prescribed medications for heart disease. Reviews the medication, class of the drug, and side effects. Includes the steps to properly store meds and maintain the prescription regimen.   Cardiac Medications II: -Group verbal and written instruction to review commonly prescribed medications for heart disease. Reviews the medication, class of the drug, and side effects. (all other drug classes)    Go Sex-Intimacy & Heart Disease, Get SMART - Goal Setting: - Group verbal and written instruction through game format to discuss heart disease and the return to sexual intimacy. Provides group  verbal and written material to discuss and apply goal setting through the application of the S.M.A.R.T. Method.   Other Matters of the Heart: - Provides group verbal, written materials and models to describe Stable Angina and Peripheral Artery. Includes description of the disease process and treatment options available to the cardiac patient.   Exercise & Equipment Safety: - Individual verbal instruction and demonstration of equipment use and safety with use of the equipment.   Cardiac Rehab from 12/18/2018 in [facility] Cardiac and Pulmonary Rehab  Date  12/15/18  Educator  [facility]  Instruction Review Code  1- Verbalizes Understanding      Infection Prevention: - Provides verbal and written material to individual with discussion of infection control including proper hand washing and proper equipment cleaning during exercise session.   Cardiac Rehab from 12/18/2018 in [facility] Cardiac and Pulmonary Rehab  Date  12/15/18  Educator  [facility]  Instruction Review Code  1- Verbalizes Understanding      Falls Prevention: - Provides verbal and written material to individual with discussion of falls prevention and safety.   Cardiac Rehab from 12/18/2018 in [facility] Cardiac and Pulmonary Rehab  Date  12/15/18  Educator  [facility]  Instruction Review Code  1- Verbalizes Understanding      Diabetes: - Individual verbal and written instruction to review signs/symptoms of diabetes, desired ranges of glucose level fasting, after meals and with exercise. Acknowledge that pre and post exercise glucose checks will be done for 3 sessions at entry of program.   Know Your Numbers and Risk Factors: -Group verbal and written instruction about important numbers in your health.  Discussion of what are risk factors and how they play a role in the disease process.  Review of Cholesterol, Blood Pressure, Diabetes, and BMI and the role they play in your overall health.   Sleep Hygiene: -Provides group verbal and written instruction  about how sleep can affect your health.  Define sleep hygiene, discuss sleep cycles and impact of sleep habits. Review good sleep hygiene tips.  Other: -Provides group and verbal instruction on various topics (see comments)   Knowledge Questionnaire Score: Knowledge Questionnaire Score - 12/15/18 1259      Knowledge Questionnaire Score   Pre Score  21/26   correct answers reviewed with Remo Lipps. Focus on nutrition and exercise      Core Components/Risk Factors/Patient Goals at Admission: Personal Goals and Risk Factors at Admission - 12/15/18 1256      Core Components/Risk Factors/Patient Goals on Admission    Weight Management  Yes;Weight Loss    Intervention  Weight Management: Develop a combined nutrition and exercise program designed to reach desired caloric intake, while maintaining appropriate intake of nutrient and fiber, sodium and fats, and appropriate energy expenditure required for the weight goal.;Weight Management: Provide education and appropriate resources to help participant work on and attain dietary goals.;Weight Management/Obesity: Establish reasonable short term and long term weight goals.    Admit Weight  167 lb (75.8 kg)    Goal Weight: Short Term  163 lb (73.9 kg)    Goal Weight: Long Term  150 lb (68 kg)    Expected Outcomes  Short Term: Continue to assess and modify interventions until short term weight is achieved;Long Term: Adherence to nutrition and physical activity/exercise program aimed toward attainment of established weight goal;Weight Loss: Understanding of general recommendations for a balanced deficit meal plan, which promotes 1-2 lb weight loss per week and includes a negative energy balance of [phone] kcal/d;Understanding recommendations for meals to include 15-35% energy as protein, 25-35% energy from fat, 35-60% energy from carbohydrates, less than '200mg'$  of dietary cholesterol, 20-35 gm of total fiber daily;Understanding of distribution of calorie  intake throughout the day with the consumption of 4-5 meals/snacks    Hypertension  Yes    Intervention  Provide education on lifestyle modifcations including regular physical activity/exercise, weight management, moderate sodium restriction and increased consumption of fresh fruit, vegetables, and low fat dairy, alcohol moderation, and smoking cessation.;Monitor prescription use compliance.    Expected Outcomes  Short Term: Continued assessment and intervention until BP is < 140/63m HG in hypertensive participants. < 130/830mHG in hypertensive participants with diabetes, heart failure or chronic kidney disease.;Long Term: Maintenance of blood pressure at goal levels.    Lipids  Yes    Intervention  Provide education and support for participant on nutrition & aerobic/resistive exercise along with prescribed medications to achieve LDL '70mg'$ , HDL >'40mg'$ .    Expected Outcomes  Short Term: Participant states understanding of desired cholesterol values and is compliant with medications prescribed. Participant is following exercise prescription and nutrition guidelines.;Long Term: Cholesterol controlled with medications as prescribed, with individualized exercise RX and with personalized nutrition plan. Value goals: LDL < '70mg'$ , HDL > 40 mg.       Core Components/Risk Factors/Patient Goals Review:  Goals and Risk Factor Review    Row Name 12/26/18 1335             Core Components/Risk Factors/Patient Goals Review   Personal Goals Review  Weight Management/Obesity;Hypertension;Lipids       Review  JoMattiaas not been watching her weight at home, but will start to do so at least every other day.  She has not been taking her blood pressure despite having a new cuff from her daughter.  She will have her daughter show her how to use the cuff so that she can start to monitor her pressures at home.  Currently, she feels good and thinks that she will be able to exercise at  home.        Expected Outcomes  Short:  Start checking weight and blood pressures at home.  Long: Continue to monitor risk factors.           Core Components/Risk Factors/Patient Goals at Discharge (Final Review):  Goals and Risk Factor Review - 12/26/18 1335      Core Components/Risk Factors/Patient Goals Review   Personal Goals Review  Weight Management/Obesity;Hypertension;Lipids    Review  Seleena has not been watching her weight at home, but will start to do so at least every other day.  She has not been taking her blood pressure despite having a new cuff from her daughter.  She will have her daughter show her how to use the cuff so that she can start to monitor her pressures at home.  Currently, she feels good and thinks that she will be able to exercise at home.     Expected Outcomes  Short: Start checking weight and blood pressures at home.  Long: Continue to monitor risk factors.        ITP Comments: ITP Comments    Row Name 12/15/18 1228 12/26/18 1330 12/31/18 1345       ITP Comments  Med Review completed. Initial ITP created. Diagnosis can be found in [facility] 02/11/18  Our program is currently closed due to COVID-19.  We are communicating with patient via phone calls and emails.   30 day review completed. Continue with ITP unless directed changes by Medical Director at review        Comments:

## 2019-01-01 ENCOUNTER — Encounter: Payer: Self-pay | Admitting: *Deleted

## 2019-01-01 DIAGNOSIS — Z9861 Coronary angioplasty status: Secondary | ICD-10-CM

## 2019-01-26 ENCOUNTER — Encounter: Payer: Self-pay | Admitting: *Deleted

## 2019-01-26 DIAGNOSIS — Z9861 Coronary angioplasty status: Secondary | ICD-10-CM

## 2019-02-18 ENCOUNTER — Encounter: Payer: Self-pay | Admitting: *Deleted

## 2019-02-18 DIAGNOSIS — Z9861 Coronary angioplasty status: Secondary | ICD-10-CM

## 2019-03-11 ENCOUNTER — Telehealth: Payer: Self-pay | Admitting: *Deleted

## 2019-03-11 NOTE — Telephone Encounter (Signed)
Pt outreach call.  Left message

## 2019-03-18 DIAGNOSIS — Z9861 Coronary angioplasty status: Secondary | ICD-10-CM

## 2019-03-23 ENCOUNTER — Telehealth: Payer: Self-pay

## 2019-03-23 NOTE — Telephone Encounter (Signed)
Called pt to see if she was able to get her daughter's email, she would like instead to have a physical copy mailed.

## 2019-04-22 ENCOUNTER — Encounter: Payer: Self-pay | Admitting: *Deleted

## 2019-04-22 DIAGNOSIS — Z9861 Coronary angioplasty status: Secondary | ICD-10-CM

## 2019-04-22 NOTE — Progress Notes (Signed)
Cardiac Individual Treatment Plan  Patient Details  Name: Melinda Maxwell MRN: [id number] Date of Birth: 11/25/1940 Referring Provider:     Cardiac Rehab from 12/15/2018 in [facility] Cardiac and Pulmonary Rehab  Referring Provider  Frazier Richards MD      Initial Encounter Date:    Cardiac Rehab from 12/15/2018 in [facility] Cardiac and Pulmonary Rehab  Date  12/15/18      Visit Diagnosis: S/P PTCA (percutaneous transluminal coronary angioplasty)  Patient's Home Medications on Admission:  Current Outpatient Medications:  .  allopurinol (ZYLOPRIM) 300 MG tablet, Take 300 mg by mouth daily., Disp: , Rfl:  .  Cholecalciferol (VITAMIN D3) 50 MCG (2000 UT) capsule, Take by mouth., Disp: , Rfl:  .  clopidogrel (PLAVIX) 75 MG tablet, Take 1 tablet (75 mg total) by mouth daily with breakfast., Disp: 90 tablet, Rfl: 4 .  colchicine 0.6 MG tablet, Take by mouth., Disp: , Rfl:  .  diltiazem (CARDIZEM CD) 120 MG 24 hr capsule, Take 1 capsule (120 mg total) by mouth daily., Disp: , Rfl:  .  furosemide (LASIX) 40 MG tablet, Take 40 mg by mouth daily., Disp: , Rfl:  .  isosorbide mononitrate (IMDUR) 30 MG 24 hr tablet, Take 30 mg by mouth daily., Disp: , Rfl: 11 .  metoprolol succinate (TOPROL XL) 25 MG 24 hr tablet, Take 1 tablet (25 mg total) by mouth daily. (Patient not taking: Reported on 12/15/2018), Disp: , Rfl:  .  Naphazoline-Pheniramine (ALLERGY EYE OP), Place 1 drop into both eyes daily as needed (for dry eyes)., Disp: , Rfl:  .  nitroGLYCERIN (NITROSTAT) 0.4 MG SL tablet, Place 0.4 mg under the tongue every 5 (five) minutes as needed for chest pain. , Disp: , Rfl: 0 .  nystatin (MYCOSTATIN/NYSTOP) powder, Apply 1 g topically daily as needed. areas or skinfolds for yeast, Disp: , Rfl: 3 .  pantoprazole (PROTONIX) 40 MG tablet, Take 40 mg by mouth daily. , Disp: , Rfl: 2 .  potassium chloride (K-DUR) 10 MEQ tablet, , Disp: , Rfl:  .  rivaroxaban (XARELTO) 20 MG TABS tablet, Take 1 tablet (20 mg total) by  mouth daily., Disp: 30 tablet, Rfl:  .  simvastatin (ZOCOR) 10 MG tablet, Take 10 mg by mouth daily. HLD- reduced d/t interaction with Diltiazem- increased r/f Rhabdomyelosis, Disp: , Rfl:  .  spironolactone (ALDACTONE) 25 MG tablet, Take 25 mg by mouth daily. , Disp: , Rfl: 2 .  white petrolatum (VASELINE) GEL, Apply 1 application topically as needed (for eyes and face)., Disp: , Rfl:   Past Medical History: Past Medical History:  Diagnosis Date  . Arthritis   . Asthma without status asthmaticus    unspecified  . Breast cancer ([place])   . Coronary artery disease   . Diverticulosis   . DVT (deep venous thrombosis) ([place])   . Edema   . Gout   . Hyperlipidemia   . Hypertension   . MI, old   . Myocardial infarct ([place]) 2004  . Sleep apnea     Tobacco Use: Social History   Tobacco Use  Smoking Status Never Smoker  Smokeless Tobacco Never Used    Labs: Recent Review Flowsheet Data    There is no flowsheet data to display.       Exercise Target Goals: Exercise Program Goal: Individual exercise prescription set using results from initial 6 min walk test and THRR while considering  patient's activity barriers and safety.   Exercise Prescription Goal: Initial exercise prescription  builds to 30-45 minutes a day of aerobic activity, 2-3 days per week.  Home exercise guidelines will be given to patient during program as part of exercise prescription that the participant will acknowledge.  Activity Barriers & Risk Stratification: Activity Barriers & Cardiac Risk Stratification - 12/15/18 1306      Activity Barriers & Cardiac Risk Stratification   Activity Barriers  Arthritis;Shortness of Breath;Balance Concerns;Assistive Device;Deconditioning;Muscular Weakness;Other (comment)    Comments  synovial cyst, yeast infection in groin folds which effects gait    Cardiac Risk Stratification  High       6 Minute Walk: 6 Minute Walk    Row Name 12/15/18 1357         6 Minute Walk    Phase  Initial     Distance  475 feet     Walk Time  5.07 minutes     # of Rest Breaks  2 32 sec, 23 sec     MPH  1.06     METS  0.74     RPE  15     VO2 Peak  2.61     Symptoms  Yes (comment)     Comments  shuffling gait, unsteady, legs feel weak, dizziness, forward lean with gait, slight chest tightness at end     Resting HR  72 bpm     Resting BP  130/68     Resting Oxygen Saturation   100 %     Exercise Oxygen Saturation  during 6 min walk  100 %     Max Ex. HR  103 bpm     Max Ex. BP  138/72     2 Minute Post BP  126/64        Oxygen Initial Assessment:   Oxygen Re-Evaluation:   Oxygen Discharge (Final Oxygen Re-Evaluation):   Initial Exercise Prescription: Initial Exercise Prescription - 12/15/18 1400      Date of Initial Exercise RX and Referring Provider   Date  12/15/18    Referring Provider  Frazier Richards MD      Treadmill   MPH  0.8    Grade  0    Minutes  15    METs  1.6      NuStep   Level  4    SPM  80    Minutes  15    METs  1.5      Biostep-RELP   Level  1    SPM  50    Minutes  15    METs  1      Prescription Details   Frequency (times per week)  2    Duration  Progress to 30 minutes of continuous aerobic without signs/symptoms of physical distress      Intensity   THRR 40-80% of Max Heartrate  100-127    Ratings of Perceived Exertion  11-13    Perceived Dyspnea  0-4      Progression   Progression  Continue to progress workloads to maintain intensity without signs/symptoms of physical distress.      Resistance Training   Training Prescription  Yes    Weight  3 lbs    Reps  10-15       Perform Capillary Blood Glucose checks as needed.  Exercise Prescription Changes: Exercise Prescription Changes    Row Name 12/15/18 1300 12/24/18 0900           Response to Exercise   Blood Pressure (Admit)  130/68  118/72      Blood Pressure (Exercise)  138/72  140/70      Blood Pressure (Exit)  126/64  132/70      Heart  Rate (Admit)  72 bpm  47 bpm      Heart Rate (Exercise)  103 bpm  88 bpm      Heart Rate (Exit)  69 bpm  58 bpm      Oxygen Saturation (Admit)  100 %  -      Oxygen Saturation (Exercise)  100 %  -      Rating of Perceived Exertion (Exercise)  15  11      Symptoms  shuffling gait, unsteady, forward lean, legs feel week, dizzy, slight chest pain  fatigue      Comments  walk test results  first full day of exercise      Duration  -  Progress to 30 minutes of  aerobic without signs/symptoms of physical distress      Intensity  -  THRR unchanged        Progression   Progression  -  Continue to progress workloads to maintain intensity without signs/symptoms of physical distress.      Average METs  -  1.1        Resistance Training   Training Prescription  -  Yes      Weight  -  3 lbs      Reps  -  10-15        Interval Training   Interval Training  -  No        NuStep   Level  -  1      Minutes  -  15      METs  -  1.1        Biostep-RELP   Level  -  1      Minutes  -  15      METs  -  1        Home Exercise Plan   Plans to continue exercise at  -  Home (comment) walking      Frequency  -  Add 2 additional days to program exercise sessions.      Initial Home Exercises Provided  -  12/23/18 sent in mail         Exercise Comments: Exercise Comments    Row Name 12/18/18 1023           Exercise Comments   First full day of exercise!  Patient was oriented to gym and equipment including functions, settings, policies, and procedures.  Patient's individual exercise prescription and treatment plan were reviewed.  All starting workloads were established based on the results of the 6 minute walk test done at initial orientation visit.  The plan for exercise progression was also introduced and progression will be customized based on patient's performance and goals.          Exercise Goals and Review: Exercise Goals    Row Name 12/15/18 1415             Exercise Goals    Increase Physical Activity  Yes       Intervention  Provide advice, education, support and counseling about physical activity/exercise needs.;Develop an individualized exercise prescription for aerobic and resistive training based on initial evaluation findings, risk stratification, comorbidities and participant's personal goals.       Expected Outcomes  Short Term: Attend rehab on a regular basis to increase  amount of physical activity.;Long Term: Add in home exercise to make exercise part of routine and to increase amount of physical activity.;Long Term: Exercising regularly at least 3-5 days a week.       Increase Strength and Stamina  Yes       Intervention  Provide advice, education, support and counseling about physical activity/exercise needs.;Develop an individualized exercise prescription for aerobic and resistive training based on initial evaluation findings, risk stratification, comorbidities and participant's personal goals.       Expected Outcomes  Short Term: Increase workloads from initial exercise prescription for resistance, speed, and METs.;Short Term: Perform resistance training exercises routinely during rehab and add in resistance training at home;Long Term: Improve cardiorespiratory fitness, muscular endurance and strength as measured by increased METs and functional capacity (6MWT)       Able to understand and use rate of perceived exertion (RPE) scale  Yes       Intervention  Provide education and explanation on how to use RPE scale       Expected Outcomes  Long Term:  Able to use RPE to guide intensity level when exercising independently;Short Term: Able to use RPE daily in rehab to express subjective intensity level       Knowledge and understanding of Target Heart Rate Range (THRR)  Yes       Intervention  Provide education and explanation of THRR including how the numbers were predicted and where they are located for reference       Expected Outcomes  Short Term: Able to  state/look up THRR;Short Term: Able to use daily as guideline for intensity in rehab;Long Term: Able to use THRR to govern intensity when exercising independently       Able to check pulse independently  Yes       Intervention  Provide education and demonstration on how to check pulse in carotid and radial arteries.;Review the importance of being able to check your own pulse for safety during independent exercise       Expected Outcomes  Short Term: Able to explain why pulse checking is important during independent exercise;Long Term: Able to check pulse independently and accurately       Understanding of Exercise Prescription  Yes       Intervention  Provide education, explanation, and written materials on patient's individual exercise prescription       Expected Outcomes  Short Term: Able to explain program exercise prescription;Long Term: Able to explain home exercise prescription to exercise independently          Exercise Goals Re-Evaluation : Exercise Goals Re-Evaluation    Row Name 12/18/18 1023 12/24/18 0859 12/26/18 1333 01/01/19 1148 01/26/19 1139     Exercise Goal Re-Evaluation   Exercise Goals Review  Increase Physical Activity;Increase Strength and Stamina;Able to understand and use rate of perceived exertion (RPE) scale;Knowledge and understanding of Target Heart Rate Range (THRR);Understanding of Exercise Prescription  Increase Physical Activity;Increase Strength and Stamina;Understanding of Exercise Prescription  Increase Physical Activity;Increase Strength and Stamina;Understanding of Exercise Prescription  Increase Physical Activity;Increase Strength and Stamina;Understanding of Exercise Prescription  Increase Physical Activity;Increase Strength and Stamina;Understanding of Exercise Prescription   Comments  Reviewed RPE scale, THR and program prescription with pt today.  Pt voiced understanding and was given a copy of goals to take home.   Melinda Maxwell has completed her first full day of  exercise.  She does not have access to email so we have sent out her home exercise guidelines in the  mail and will do a follow up phone calls.  We will continue to monitor her progress at home.   Melinda Maxwell is still waiting on her packet.  She is eager to get started and has started to walk some already.  She says she will call if she has any questions.   Melinda Maxwell is still waiting on her packet.  She thinks her daughter may have overlooked it and not brought it over.  She is eager to get started.  We talked about starting to walk inside and doing some warm up exercises.   Melinda Maxwell has been walking at home some.  She has continued to have some dizzy spells and was evaulated by her cardiologist.  She is now scheduled for a nuclear stress test next week to see if there have been any changes since last year.  She is also wearing a holter monitor.  She enjoys her walks, especially when able to go with her daughter.    Expected Outcomes  Short: Use RPE daily to regulate intensity. Long: Follow program prescription in THR.  Short: Start to exercise by walking at home. Long: Continue to increase activity levels.   Short: Continue to walk at home.  Long: Continue to increase activity levels.   Short: Start to walk and do warm up exercises.  Long: Continue to move more  Short: Continue to walk and do well on stress test.  Long: Continue to get out to move.    [place] Name 02/18/19 1115             Exercise Goal Re-Evaluation   Exercise Goals Review  Increase Physical Activity;Increase Strength and Stamina;Understanding of Exercise Prescription       Comments  Melinda Maxwell has been walking at home.  She would like to be able to get our videos.  She will have her daughter email [place] with contact info for her.  She did well on her stress test and they could not determine why she was getting her dizzy spells.  They believe it is a combination of serveral things and will continue with medical management. Recently, she has been feeling better.        Expected Outcomes  Short: Continue to walk daily and get emails of our videos.  Long: Continue to increase activity levels.           Discharge Exercise Prescription (Final Exercise Prescription Changes): Exercise Prescription Changes - 12/24/18 0900      Response to Exercise   Blood Pressure (Admit)  118/72    Blood Pressure (Exercise)  140/70    Blood Pressure (Exit)  132/70    Heart Rate (Admit)  47 bpm    Heart Rate (Exercise)  88 bpm    Heart Rate (Exit)  58 bpm    Rating of Perceived Exertion (Exercise)  11    Symptoms  fatigue    Comments  first full day of exercise    Duration  Progress to 30 minutes of  aerobic without signs/symptoms of physical distress    Intensity  THRR unchanged      Progression   Progression  Continue to progress workloads to maintain intensity without signs/symptoms of physical distress.    Average METs  1.1      Resistance Training   Training Prescription  Yes    Weight  3 lbs    Reps  10-15      Interval Training   Interval Training  No      NuStep  Level  1    Minutes  15    METs  1.1      Biostep-RELP   Level  1    Minutes  15    METs  1      Home Exercise Plan   Plans to continue exercise at  Home (comment)   walking   Frequency  Add 2 additional days to program exercise sessions.    Initial Home Exercises Provided  12/23/18   sent in mail      Nutrition:  Target Goals: Understanding of nutrition guidelines, daily intake of sodium <1577m, cholesterol <2028m calories 30% from fat and 7% or less from saturated fats, daily to have 5 or more servings of fruits and vegetables.  Biometrics: Pre Biometrics - 12/15/18 1416      Pre Biometrics   Height  5' 1.1" (1.552 m)    Weight  167 lb (75.8 kg)    Waist Circumference  36.5 inches    Hip Circumference  44 inches    Waist to Hip Ratio  0.83 %    BMI (Calculated)  31.45    Single Leg Stand  0 seconds        Nutrition Therapy Plan and Nutrition Goals: Nutrition  Therapy & Goals - 02/03/19 1328      Nutrition Therapy   Diet  low Na, Heart healthy diet    Protein (specify units)  ~60g    Fiber  25 grams    Whole Grain Foods  3 servings    Saturated Fats  12 max. grams    Fruits and Vegetables  5 servings/day    Sodium  1.5 grams      Personal Nutrition Goals   Nutrition Goal  ST: pt wants to start eating 1 serving of greens /day at least 5x/week ST: eat vitamin c rich foods with meals ST: increase fiber for better bowel health and regularity (prunes and other F/V) ST: limit black tea consumption around mealtime LT:  to be able to walk without shuffling feet with cane LT: be more empowered with healthy eating (her normal) LT: No longer be anemic    Comments  Pt would benefit from in-person/ visual education. Had RD in past showed portions and wants to know what to eat. She is currently anemic and is on iron pills; stomach pains. Raisin bran in the mornning. Yogurt at night. chicken and rice (pts favortie) with beans and tossed salad with squash casserole with cheese for her birthday. pt reports eating a lot of cheese and will eat a cheese sandwich when all else fails for lunch.  will go to farm to get cabbage, potatoes. Pt reports that is aware what to eat, but isn't really doing it. Discussed MyPlate and general healthy eating. Discussed the importance of fiber and how to increase iron absorption.       Intervention Plan   Intervention  Prescribe, educate and counsel regarding individualized specific dietary modifications aiming towards targeted core components such as weight, hypertension, lipid management, diabetes, heart failure and other comorbidities.    Expected Outcomes  Short Term Goal: Understand basic principles of dietary content, such as calories, fat, sodium, cholesterol and nutrients.;Short Term Goal: A plan has been developed with personal nutrition goals set during dietitian appointment.;Long Term Goal: Adherence to prescribed nutrition plan.        Nutrition Assessments: Nutrition Assessments - 12/15/18 1302      MEDFICTS Scores   Pre Score  48  Nutrition Goals Re-Evaluation: Nutrition Goals Re-Evaluation    [place] Name 03/18/19 1332             Goals   Nutrition Goal  ST: pt wants to start eating 1 serving of greens /day at least 5x/week LT: be more empowered with healthy eating (her normal) LT: No longer be anemic       Comment  Been putting off the greens because was nervous about her blood, but spoke with the dr and got ok from him so will now try to implement that. She says she knows what she needs to do, but hasn't done anything yet.  Pt wants some nutrition content, has not gotten her daughters email, told her will call back to get that from her so that we can send her over some content.        Expected Outcome  Pt no longer anemic, pt empowered with healthy eating          Nutrition Goals Discharge (Final Nutrition Goals Re-Evaluation): Nutrition Goals Re-Evaluation - 03/18/19 1332      Goals   Nutrition Goal  ST: pt wants to start eating 1 serving of greens /day at least 5x/week LT: be more empowered with healthy eating (her normal) LT: No longer be anemic    Comment  Been putting off the greens because was nervous about her blood, but spoke with the dr and got ok from him so will now try to implement that. She says she knows what she needs to do, but hasn't done anything yet.  Pt wants some nutrition content, has not gotten her daughters email, told her will call back to get that from her so that we can send her over some content.     Expected Outcome  Pt no longer anemic, pt empowered with healthy eating       Psychosocial: Target Goals: Acknowledge presence or absence of significant depression and/or stress, maximize coping skills, provide positive support system. Participant is able to verbalize types and ability to use techniques and skills needed for reducing stress and depression.   Initial Review  & Psychosocial Screening: Initial Psych Review & Screening - 12/15/18 1300      Initial Review   Current issues with  Current Stress Concerns    Source of Stress Concerns  Transportation;Unable to participate in former interests or hobbies    Comments  Melinda Maxwell relies on family and friend/care giver to take her places. Sometimes she feels bad asking them for help. She does have a good support system and people constantly checking up on her.       Family Dynamics   Good Support System?  Yes   family member, friend     Barriers   Psychosocial barriers to participate in program  There are no identifiable barriers or psychosocial needs.;The patient should benefit from training in stress management and relaxation.      Screening Interventions   Interventions  Encouraged to exercise;Program counselor consult;Provide feedback about the scores to participant;To provide support and resources with identified psychosocial needs    Expected Outcomes  Short Term goal: Utilizing psychosocial counselor, staff and physician to assist with identification of specific Stressors or current issues interfering with healing process. Setting desired goal for each stressor or current issue identified.;Long Term Goal: Stressors or current issues are controlled or eliminated.;Short Term goal: Identification and review with participant of any Quality of Life or Depression concerns found by scoring the questionnaire.;Long Term goal: The participant improves  quality of Life and PHQ9 Scores as seen by post scores and/or verbalization of changes       Quality of Life Scores:  Quality of Life - 12/15/18 1041      Quality of Life   Select  Quality of Life      Quality of Life Scores   Health/Function Pre  18.3 %    Socioeconomic Pre  25.1 %    Psych/Spiritual Pre  24 %    Family Pre  22.5 %    GLOBAL Pre  21.23 %      Scores of 19 and below usually indicate a poorer quality of life in these areas.  A difference of  2-3  points is a clinically meaningful difference.  A difference of 2-3 points in the total score of the Quality of Life Index has been associated with significant improvement in overall quality of life, self-image, physical symptoms, and general health in studies assessing change in quality of life.  PHQ-9: Recent Review Flowsheet Data    Depression screen [facility] 2/9 12/15/2018   Decreased Interest 0   Down, Depressed, Hopeless 0   PHQ - 2 Score 0   Altered sleeping 2   Tired, decreased energy 3   Change in appetite 0   Feeling bad or failure about yourself  0   Trouble concentrating 1   Moving slowly or fidgety/restless 2   Suicidal thoughts 0   PHQ-9 Score 8   Difficult doing work/chores Somewhat difficult     Interpretation of Total Score  Total Score Depression Severity:  1-4 = Minimal depression, 5-9 = Mild depression, 10-14 = Moderate depression, 15-19 = Moderately severe depression, 20-27 = Severe depression   Psychosocial Evaluation and Intervention: Psychosocial Evaluation - 12/18/18 1041      Psychosocial Evaluation & Interventions   Interventions  Encouraged to exercise with the program and follow exercise prescription    Comments  Counselor met with patient and caregiver Melinda Maxwell.  Patient reported having two stents placed May 2019 and having had 'dizzy spells' since them.  Per patient and caregiver report, they asked physician about resources for patient to improve ability to [facility] and walk with more confidence which led them to this program.  Patient reports having a fear of falling due to lack of stability and having a history of falling before.  Support system includes her sister, brother in Sports coach, caregiver, daughter Melinda Maxwell, and extended family and church support.  Patient does live alone.  No health concerns to report, feels that she sleeps and eats well.  Reported history of sleep apnea yet not currently using a machine.    Expected Outcomes  Short: increase exercise; long:  improve balance and confidence in walking    Continue Psychosocial Services   Follow up required by staff       Psychosocial Re-Evaluation: Psychosocial Re-Evaluation    [place] Name 01/01/19 1155 01/26/19 1140 02/18/19 1118         Psychosocial Re-Evaluation   Current issues with  Current Stress Concerns;Current Sleep Concerns  Current Stress Concerns;Current Sleep Concerns  Current Stress Concerns;Current Sleep Concerns     Comments  Melinda Maxwell is doing well at home. She really wants to get started on her home exercise but has not gotten her packet yet.  She thinks her daughter may have it but doesn't know it.  She has switched her lasix to daytime and is now sleeping better.  She still has to get up but not as frequently.  She has noted that sometimes she forget to take it and when she does, her weight goes up.  Overall, she is staying postive.   Melinda Maxwell is doing well at home mentally.  She has had some dizzy spells and is working with her cardiologist on this.  She is wearing a holter monitor and has a nuclear stress test coming up next week.  This week she is celebrating her 78th birthday!!  She is excited to reach this milestones as her parents both died in their 63s.  Her family sent her cake and dinner this past weekend to celebrate her!  Melinda Maxwell continues do well at home.  Her dizzy spells are getting better.  She still talks with her family regularly.  She is looking forward to getting back to class!     Expected Outcomes  Short: Remeber to take meds daily and get packet from daughter.  Long: Continue to stay postive.   Short: Enjoy her birthday.  Long: Continue to stay positive.   Short: Continue to feel better. Long: Continue to stay positive.      Interventions  Encouraged to attend Cardiac Rehabilitation for the exercise  Encouraged to attend Cardiac Rehabilitation for the exercise  -     Continue Psychosocial Services   Follow up required by staff  Follow up required by staff  -     Comments  Melinda Maxwell  relies on family and friend/care giver to take her places. Sometimes she feels bad asking them for help. She does have a good support system and people constantly checking up on her.   -  -       Initial Review   Source of Stress Concerns  Transportation;Unable to participate in former interests or hobbies  -  -        Psychosocial Discharge (Final Psychosocial Re-Evaluation): Psychosocial Re-Evaluation - 02/18/19 1118      Psychosocial Re-Evaluation   Current issues with  Current Stress Concerns;Current Sleep Concerns    Comments  Melinda Maxwell continues do well at home.  Her dizzy spells are getting better.  She still talks with her family regularly.  She is looking forward to getting back to class!    Expected Outcomes  Short: Continue to feel better. Long: Continue to stay positive.        Vocational Rehabilitation: Provide vocational rehab assistance to qualifying candidates.   Vocational Rehab Evaluation & Intervention: Vocational Rehab - 12/15/18 1300      Initial Vocational Rehab Evaluation & Intervention   Assessment shows need for Vocational Rehabilitation  No       Education: Education Goals: Education classes will be provided on a variety of topics geared toward better understanding of heart health and risk factor modification. Participant will state understanding/return demonstration of topics presented as noted by education test scores.  Learning Barriers/Preferences: Learning Barriers/Preferences - 12/15/18 1258      Learning Barriers/Preferences   Learning Barriers  None    Learning Preferences  Individual Instruction       Education Topics:  AED/CPR: - Group verbal and written instruction with the use of models to demonstrate the basic use of the AED with the basic ABC's of resuscitation.   General Nutrition Guidelines/Fats and Fiber: -Group instruction provided by verbal, written material, models and posters to present the general guidelines for heart healthy  nutrition. Gives an explanation and review of dietary fats and fiber.   Controlling Sodium/Reading Food Labels: -Group verbal and written material supporting the discussion of  sodium use in heart healthy nutrition. Review and explanation with models, verbal and written materials for utilization of the food label.   Cardiac Rehab from 12/18/2018 in [facility] Cardiac and Pulmonary Rehab  Date  12/18/18  Educator  [facility]  Instruction Review Code  1- Verbalizes Understanding      Exercise Physiology & General Exercise Guidelines: - Group verbal and written instruction with models to review the exercise physiology of the cardiovascular system and associated critical values. Provides general exercise guidelines with specific guidelines to those with heart or lung disease.    Aerobic Exercise & Resistance Training: - Gives group verbal and written instruction on the various components of exercise. Focuses on aerobic and resistive training programs and the benefits of this training and how to safely progress through these programs..   Flexibility, Balance, Mind/Body Relaxation: Provides group verbal/written instruction on the benefits of flexibility and balance training, including mind/body exercise modes such as yoga, pilates and tai chi.  Demonstration and skill practice provided.   Stress and Anxiety: - Provides group verbal and written instruction about the health risks of elevated stress and causes of high stress.  Discuss the correlation between heart/lung disease and anxiety and treatment options. Review healthy ways to manage with stress and anxiety.   Depression: - Provides group verbal and written instruction on the correlation between heart/lung disease and depressed mood, treatment options, and the stigmas associated with seeking treatment.   Anatomy & Physiology of the Heart: - Group verbal and written instruction and models provide basic cardiac anatomy and physiology, with the coronary  electrical and arterial systems. Review of Valvular disease and Heart Failure   Cardiac Procedures: - Group verbal and written instruction to review commonly prescribed medications for heart disease. Reviews the medication, class of the drug, and side effects. Includes the steps to properly store meds and maintain the prescription regimen. (beta blockers and nitrates)   Cardiac Medications I: - Group verbal and written instruction to review commonly prescribed medications for heart disease. Reviews the medication, class of the drug, and side effects. Includes the steps to properly store meds and maintain the prescription regimen.   Cardiac Medications II: -Group verbal and written instruction to review commonly prescribed medications for heart disease. Reviews the medication, class of the drug, and side effects. (all other drug classes)    Go Sex-Intimacy & Heart Disease, Get SMART - Goal Setting: - Group verbal and written instruction through game format to discuss heart disease and the return to sexual intimacy. Provides group verbal and written material to discuss and apply goal setting through the application of the S.M.A.R.T. Method.   Other Matters of the Heart: - Provides group verbal, written materials and models to describe Stable Angina and Peripheral Artery. Includes description of the disease process and treatment options available to the cardiac patient.   Exercise & Equipment Safety: - Individual verbal instruction and demonstration of equipment use and safety with use of the equipment.   Cardiac Rehab from 12/18/2018 in [facility] Cardiac and Pulmonary Rehab  Date  12/15/18  Educator  [facility]  Instruction Review Code  1- Verbalizes Understanding      Infection Prevention: - Provides verbal and written material to individual with discussion of infection control including proper hand washing and proper equipment cleaning during exercise session.   Cardiac Rehab from 12/18/2018 in  [facility] Cardiac and Pulmonary Rehab  Date  12/15/18  Educator  [facility]  Instruction Review Code  1- Verbalizes Understanding  Falls Prevention: - Provides verbal and written material to individual with discussion of falls prevention and safety.   Cardiac Rehab from 12/18/2018 in [facility] Cardiac and Pulmonary Rehab  Date  12/15/18  Educator  [facility]  Instruction Review Code  1- Verbalizes Understanding      Diabetes: - Individual verbal and written instruction to review signs/symptoms of diabetes, desired ranges of glucose level fasting, after meals and with exercise. Acknowledge that pre and post exercise glucose checks will be done for 3 sessions at entry of program.   Know Your Numbers and Risk Factors: -Group verbal and written instruction about important numbers in your health.  Discussion of what are risk factors and how they play a role in the disease process.  Review of Cholesterol, Blood Pressure, Diabetes, and BMI and the role they play in your overall health.   Sleep Hygiene: -Provides group verbal and written instruction about how sleep can affect your health.  Define sleep hygiene, discuss sleep cycles and impact of sleep habits. Review good sleep hygiene tips.    Other: -Provides group and verbal instruction on various topics (see comments)   Knowledge Questionnaire Score: Knowledge Questionnaire Score - 12/15/18 1259      Knowledge Questionnaire Score   Pre Score  21/26   correct answers reviewed with Melinda Maxwell. Focus on nutrition and exercise      Core Components/Risk Factors/Patient Goals at Admission: Personal Goals and Risk Factors at Admission - 12/15/18 1256      Core Components/Risk Factors/Patient Goals on Admission    Weight Management  Yes;Weight Loss    Intervention  Weight Management: Develop a combined nutrition and exercise program designed to reach desired caloric intake, while maintaining appropriate intake of nutrient and fiber, sodium and fats, and  appropriate energy expenditure required for the weight goal.;Weight Management: Provide education and appropriate resources to help participant work on and attain dietary goals.;Weight Management/Obesity: Establish reasonable short term and long term weight goals.    Admit Weight  167 lb (75.8 kg)    Goal Weight: Short Term  163 lb (73.9 kg)    Goal Weight: Long Term  150 lb (68 kg)    Expected Outcomes  Short Term: Continue to assess and modify interventions until short term weight is achieved;Long Term: Adherence to nutrition and physical activity/exercise program aimed toward attainment of established weight goal;Weight Loss: Understanding of general recommendations for a balanced deficit meal plan, which promotes 1-2 lb weight loss per week and includes a negative energy balance of [phone] kcal/d;Understanding recommendations for meals to include 15-35% energy as protein, 25-35% energy from fat, 35-60% energy from carbohydrates, less than 264m of dietary cholesterol, 20-35 gm of total fiber daily;Understanding of distribution of calorie intake throughout the day with the consumption of 4-5 meals/snacks    Hypertension  Yes    Intervention  Provide education on lifestyle modifcations including regular physical activity/exercise, weight management, moderate sodium restriction and increased consumption of fresh fruit, vegetables, and low fat dairy, alcohol moderation, and smoking cessation.;Monitor prescription use compliance.    Expected Outcomes  Short Term: Continued assessment and intervention until BP is < 140/983mHG in hypertensive participants. < 130/807mG in hypertensive participants with diabetes, heart failure or chronic kidney disease.;Long Term: Maintenance of blood pressure at goal levels.    Lipids  Yes    Intervention  Provide education and support for participant on nutrition & aerobic/resistive exercise along with prescribed medications to achieve LDL <33m63mDL >40mg100m Expected  Outcomes  Short Term: Participant states understanding of desired cholesterol values and is compliant with medications prescribed. Participant is following exercise prescription and nutrition guidelines.;Long Term: Cholesterol controlled with medications as prescribed, with individualized exercise RX and with personalized nutrition plan. Value goals: LDL < '70mg'$ , HDL > 40 mg.       Core Components/Risk Factors/Patient Goals Review:  Goals and Risk Factor Review    Row Name 12/26/18 1335 01/01/19 1203 01/26/19 1142 02/18/19 1119       Core Components/Risk Factors/Patient Goals Review   Personal Goals Review  Weight Management/Obesity;Hypertension;Lipids  Weight Management/Obesity;Hypertension;Lipids  Weight Management/Obesity;Hypertension;Lipids  Weight Management/Obesity;Hypertension;Lipids    Review  Melinda Maxwell has not been watching her weight at home, but will start to do so at least every other day.  She has not been taking her blood pressure despite having a new cuff from her daughter.  She will have her daughter show her how to use the cuff so that she can start to monitor her pressures at home.  Currently, she feels good and thinks that she will be able to exercise at home.   Melinda Maxwell has not been able to weigh her self as she does not have a scale at home.  We will see if we can get help from the foundation for this.  She forgot to have her daugter check her pressure last time but will get out the cuff to remind her to do it.  She is eager to get started on her home exercise.    Melinda Maxwell is doing well at home.  She is still not able to weight as she doesn't have a scale.  Her sister brought her a blood pressure cuff, but she is not sure how to use it.  We will continue to check in with her as she is eager to get back to classes.   Melinda Maxwell is feeling better.  Seh still is not checking her weight or blood pressure at home since she does not have the equipment.  She had good numbers when she went to the doctor.      Expected Outcomes  Short: Start checking weight and blood pressures at home.  Long: Continue to monitor risk factors.   Short: Start checking blood pressues.  Long: Continue to monitor risk factors.   Short: Continue to watch that she doesn't feel like she gains too much weight.  Long: Continue to monitor symptoms of risk factors.   Short: Continue to exercise to maintain weight and monitor symptoms.  Long: Continue to monitor risk factors at appointments.        Core Components/Risk Factors/Patient Goals at Discharge (Final Review):  Goals and Risk Factor Review - 02/18/19 1119      Core Components/Risk Factors/Patient Goals Review   Personal Goals Review  Weight Management/Obesity;Hypertension;Lipids    Review  Melinda Maxwell is feeling better.  Seh still is not checking her weight or blood pressure at home since she does not have the equipment.  She had good numbers when she went to the doctor.     Expected Outcomes  Short: Continue to exercise to maintain weight and monitor symptoms.  Long: Continue to monitor risk factors at appointments.        ITP Comments: ITP Comments    Row Name 12/15/18 1228 12/26/18 1330 12/31/18 1345 04/22/19 1341     ITP Comments  Med Review completed. Initial ITP created. Diagnosis can be found in [facility] 02/11/18  Our program is currently closed due to COVID-19.  We are communicating with patient via phone calls and emails.   30 day review completed. Continue with ITP unless directed changes by Medical Director at review  30 day review cycle restarting  after being closed since March 16 because of  Covid 19 pandemic. Program opened to patients on July 6. Not all have returned. ITP updated and sent to Medical Director for review,changes as needed and signature       Comments:

## 2019-05-04 ENCOUNTER — Encounter: Payer: Medicare Other | Attending: Internal Medicine | Admitting: *Deleted

## 2019-05-04 ENCOUNTER — Other Ambulatory Visit: Payer: Self-pay

## 2019-05-04 DIAGNOSIS — Z9861 Coronary angioplasty status: Secondary | ICD-10-CM | POA: Insufficient documentation

## 2019-05-04 NOTE — Progress Notes (Signed)
Daily Session Note  Patient Details  Name: Melinda Maxwell MRN: [id number] Date of Birth: [birth year] Referring Provider:     Cardiac Rehab from 12/15/2018 in [facility] Cardiac and Pulmonary Rehab  Referring Provider  Frazier Richards MD      Encounter Date: 05/04/2019   Check In: Session Check In - 05/04/19 1619      Check-In   Supervising physician immediately available to respond to emergencies  See telemetry face sheet for immediately available ER MD    Location  [facility]-Cardiac & Pulmonary Rehab    Staff Present  Renita Papa, RN Moises Blood, BS, ACSM CEP, Exercise Physiologist;Joseph Tessie Fass RCP,RRT,BSRT    Virtual Visit  No    Medication changes reported      No    Fall or balance concerns reported     No    Warm-up and Cool-down  Performed on first and last piece of equipment    Resistance Training Performed  Yes    VAD Patient?  No    PAD/SET Patient?  No      Pain Assessment   Currently in Pain?  No/denies          Social History   Tobacco Use  Smoking Status Never Smoker  Smokeless Tobacco Never Used    Goals Met:  Independence with exercise equipment Exercise tolerated well No report of cardiac concerns or symptoms Strength training completed today  Goals Unmet:  Not Applicable  Comments: Pt able to follow exercise prescription today without complaint.  Will continue to monitor for progression.     Dr. Emily Filbert is Medical Director for [place] and [facility].

## 2019-05-07 ENCOUNTER — Encounter: Payer: Medicare Other | Admitting: *Deleted

## 2019-05-07 ENCOUNTER — Other Ambulatory Visit: Payer: Self-pay

## 2019-05-07 DIAGNOSIS — Z9861 Coronary angioplasty status: Secondary | ICD-10-CM | POA: Diagnosis not present

## 2019-05-07 NOTE — Progress Notes (Signed)
Daily Session Note  Patient Details  Name: Melinda Maxwell MRN: [id number] Date of Birth: Mar 29, 1941 Referring Provider:     Cardiac Rehab from 12/15/2018 in [facility] Cardiac and Pulmonary Rehab  Referring Provider  Frazier Richards MD      Encounter Date: 05/07/2019  Check In: Session Check In - 05/07/19 1403      Check-In   Supervising physician immediately available to respond to emergencies  See telemetry face sheet for immediately available ER MD    Location  [facility]-Cardiac & Pulmonary Rehab    Staff Present  Renita Papa, RN BSN;Joseph Tessie Fass RCP,RRT,BSRT    Virtual Visit  No    Medication changes reported      No    Fall or balance concerns reported     No    Warm-up and Cool-down  Performed on first and last piece of equipment    Resistance Training Performed  Yes    VAD Patient?  No    PAD/SET Patient?  No      Pain Assessment   Currently in Pain?  No/denies          Social History   Tobacco Use  Smoking Status Never Smoker  Smokeless Tobacco Never Used    Goals Met:  Independence with exercise equipment Exercise tolerated well Personal goals reviewed No report of cardiac concerns or symptoms Strength training completed today  Goals Unmet:  Not Applicable  Comments: Pt able to follow exercise prescription today without complaint.  Will continue to monitor for progression.    Dr. Emily Filbert is Medical Director for [place] and [facility].

## 2019-05-11 ENCOUNTER — Other Ambulatory Visit: Payer: Self-pay

## 2019-05-11 ENCOUNTER — Encounter: Payer: Medicare Other | Attending: Internal Medicine | Admitting: *Deleted

## 2019-05-11 DIAGNOSIS — Z9861 Coronary angioplasty status: Secondary | ICD-10-CM | POA: Insufficient documentation

## 2019-05-11 NOTE — Progress Notes (Signed)
Daily Session Note  Patient Details  Name: Melinda Maxwell MRN: [id number] Date of Birth: 22-Feb-1941 Referring Provider:     Cardiac Rehab from 12/15/2018 in [facility] Cardiac and Pulmonary Rehab  Referring Provider  Frazier Richards MD      Encounter Date: 05/11/2019  Check In: Session Check In - 05/11/19 1624      Check-In   Supervising physician immediately available to respond to emergencies  See telemetry face sheet for immediately available ER MD    Location  [facility]-Cardiac & Pulmonary Rehab    Staff Present  Renita Papa, RN BSN;Melissa Caiola RDN, LDN;Joseph Tessie Fass RCP,RRT,BSRT    Virtual Visit  No    Medication changes reported      No    Fall or balance concerns reported     No    Warm-up and Cool-down  Performed on first and last piece of equipment    Resistance Training Performed  Yes    VAD Patient?  No    PAD/SET Patient?  No      Pain Assessment   Currently in Pain?  No/denies          Social History   Tobacco Use  Smoking Status Never Smoker  Smokeless Tobacco Never Used    Goals Met:  Independence with exercise equipment Exercise tolerated well No report of cardiac concerns or symptoms Strength training completed today  Goals Unmet:  Not Applicable  Comments: Pt able to follow exercise prescription today without complaint.  Will continue to monitor for progression.    Dr. Emily Filbert is Medical Director for [place] and [facility].

## 2019-05-14 ENCOUNTER — Encounter: Payer: Medicare Other | Admitting: *Deleted

## 2019-05-14 ENCOUNTER — Other Ambulatory Visit: Payer: Self-pay

## 2019-05-14 DIAGNOSIS — Z9861 Coronary angioplasty status: Secondary | ICD-10-CM | POA: Diagnosis not present

## 2019-05-14 NOTE — Progress Notes (Signed)
Daily Session Note  Patient Details  Name: Melinda Maxwell MRN: [id number] Date of Birth: 06/11/1941 Referring Provider:     Cardiac Rehab from 12/15/2018 in [facility] Cardiac and Pulmonary Rehab  Referring Provider  Anderson, Marshall MD      Encounter Date: 05/14/2019  Check In: Session Check In - 05/14/19 1402      Check-In   Supervising physician immediately available to respond to emergencies  See telemetry face sheet for immediately available ER MD    Location  [facility]-Cardiac & Pulmonary Rehab    Staff Present   , RN BSN;Laureen Brown, BS, RRT, CPFT;Amanda Sommer, BA, ACSM CEP, Exercise Physiologist;Joseph Hood RCP,RRT,BSRT    Virtual Visit  No    Medication changes reported      No    Fall or balance concerns reported     No    Warm-up and Cool-down  Performed on first and last piece of equipment    Resistance Training Performed  Yes    VAD Patient?  No    PAD/SET Patient?  No      Pain Assessment   Currently in Pain?  No/denies          Social History   Tobacco Use  Smoking Status Never Smoker  Smokeless Tobacco Never Used    Goals Met:  Independence with exercise equipment Exercise tolerated well No report of cardiac concerns or symptoms Strength training completed today  Goals Unmet:  Not Applicable  Comments: Pt able to follow exercise prescription today without complaint.  Will continue to monitor for progression.    Dr. Mark Miller is Medical Director for [facility] and [facility]. 

## 2019-05-18 ENCOUNTER — Other Ambulatory Visit: Payer: Self-pay

## 2019-05-18 DIAGNOSIS — Z9861 Coronary angioplasty status: Secondary | ICD-10-CM | POA: Diagnosis not present

## 2019-05-18 NOTE — Progress Notes (Signed)
Daily Session Note  Patient Details  Name: Melinda Maxwell MRN: [id number] Date of Birth: [birth year] Referring Provider:     Cardiac Rehab from 12/15/2018 in [facility] Cardiac and Pulmonary Rehab  Referring Provider  Frazier Richards MD      Encounter Date: 05/18/2019  Check In:      Social History   Tobacco Use  Smoking Status Never Smoker  Smokeless Tobacco Never Used    Goals Met:  Independence with exercise equipment Exercise tolerated well No report of cardiac concerns or symptoms Strength training completed today  Goals Unmet:  Not Applicable  Comments: Pt able to follow exercise prescription today without complaint.  Will continue to monitor for progression.    Dr. Emily Filbert is Medical Director for [place] and [facility].

## 2019-05-20 ENCOUNTER — Encounter: Payer: Self-pay | Admitting: *Deleted

## 2019-05-20 DIAGNOSIS — Z9861 Coronary angioplasty status: Secondary | ICD-10-CM

## 2019-05-20 NOTE — Progress Notes (Signed)
Cardiac Individual Treatment Plan  Patient Details  Name: Melinda Maxwell MRN: [id number] Date of Birth: 11/25/1940 Referring Provider:     Cardiac Rehab from 12/15/2018 in [facility] Cardiac and Pulmonary Rehab  Referring Provider  Frazier Richards MD      Initial Encounter Date:    Cardiac Rehab from 12/15/2018 in [facility] Cardiac and Pulmonary Rehab  Date  12/15/18      Visit Diagnosis: S/P PTCA (percutaneous transluminal coronary angioplasty)  Patient's Home Medications on Admission:  Current Outpatient Medications:  .  allopurinol (ZYLOPRIM) 300 MG tablet, Take 300 mg by mouth daily., Disp: , Rfl:  .  Cholecalciferol (VITAMIN D3) 50 MCG (2000 UT) capsule, Take by mouth., Disp: , Rfl:  .  clopidogrel (PLAVIX) 75 MG tablet, Take 1 tablet (75 mg total) by mouth daily with breakfast., Disp: 90 tablet, Rfl: 4 .  colchicine 0.6 MG tablet, Take by mouth., Disp: , Rfl:  .  diltiazem (CARDIZEM CD) 120 MG 24 hr capsule, Take 1 capsule (120 mg total) by mouth daily., Disp: , Rfl:  .  furosemide (LASIX) 40 MG tablet, Take 40 mg by mouth daily., Disp: , Rfl:  .  isosorbide mononitrate (IMDUR) 30 MG 24 hr tablet, Take 30 mg by mouth daily., Disp: , Rfl: 11 .  metoprolol succinate (TOPROL XL) 25 MG 24 hr tablet, Take 1 tablet (25 mg total) by mouth daily. (Patient not taking: Reported on 12/15/2018), Disp: , Rfl:  .  Naphazoline-Pheniramine (ALLERGY EYE OP), Place 1 drop into both eyes daily as needed (for dry eyes)., Disp: , Rfl:  .  nitroGLYCERIN (NITROSTAT) 0.4 MG SL tablet, Place 0.4 mg under the tongue every 5 (five) minutes as needed for chest pain. , Disp: , Rfl: 0 .  nystatin (MYCOSTATIN/NYSTOP) powder, Apply 1 g topically daily as needed. areas or skinfolds for yeast, Disp: , Rfl: 3 .  pantoprazole (PROTONIX) 40 MG tablet, Take 40 mg by mouth daily. , Disp: , Rfl: 2 .  potassium chloride (K-DUR) 10 MEQ tablet, , Disp: , Rfl:  .  rivaroxaban (XARELTO) 20 MG TABS tablet, Take 1 tablet (20 mg total) by  mouth daily., Disp: 30 tablet, Rfl:  .  simvastatin (ZOCOR) 10 MG tablet, Take 10 mg by mouth daily. HLD- reduced d/t interaction with Diltiazem- increased r/f Rhabdomyelosis, Disp: , Rfl:  .  spironolactone (ALDACTONE) 25 MG tablet, Take 25 mg by mouth daily. , Disp: , Rfl: 2 .  white petrolatum (VASELINE) GEL, Apply 1 application topically as needed (for eyes and face)., Disp: , Rfl:   Past Medical History: Past Medical History:  Diagnosis Date  . Arthritis   . Asthma without status asthmaticus    unspecified  . Breast cancer ([place])   . Coronary artery disease   . Diverticulosis   . DVT (deep venous thrombosis) ([place])   . Edema   . Gout   . Hyperlipidemia   . Hypertension   . MI, old   . Myocardial infarct ([place]) 2004  . Sleep apnea     Tobacco Use: Social History   Tobacco Use  Smoking Status Never Smoker  Smokeless Tobacco Never Used    Labs: Recent Review Flowsheet Data    There is no flowsheet data to display.       Exercise Target Goals: Exercise Program Goal: Individual exercise prescription set using results from initial 6 min walk test and THRR while considering  patient's activity barriers and safety.   Exercise Prescription Goal: Initial exercise prescription  builds to 30-45 minutes a day of aerobic activity, 2-3 days per week.  Home exercise guidelines will be given to patient during program as part of exercise prescription that the participant will acknowledge.  Activity Barriers & Risk Stratification: Activity Barriers & Cardiac Risk Stratification - 12/15/18 1306      Activity Barriers & Cardiac Risk Stratification   Activity Barriers  Arthritis;Shortness of Breath;Balance Concerns;Assistive Device;Deconditioning;Muscular Weakness;Other (comment)    Comments  synovial cyst, yeast infection in groin folds which effects gait    Cardiac Risk Stratification  High       6 Minute Walk: 6 Minute Walk    Row Name 12/15/18 1357         6 Minute Walk    Phase  Initial     Distance  475 feet     Walk Time  5.07 minutes     # of Rest Breaks  2 32 sec, 23 sec     MPH  1.06     METS  0.74     RPE  15     VO2 Peak  2.61     Symptoms  Yes (comment)     Comments  shuffling gait, unsteady, legs feel weak, dizziness, forward lean with gait, slight chest tightness at end     Resting HR  72 bpm     Resting BP  130/68     Resting Oxygen Saturation   100 %     Exercise Oxygen Saturation  during 6 min walk  100 %     Max Ex. HR  103 bpm     Max Ex. BP  138/72     2 Minute Post BP  126/64        Oxygen Initial Assessment:   Oxygen Re-Evaluation:   Oxygen Discharge (Final Oxygen Re-Evaluation):   Initial Exercise Prescription: Initial Exercise Prescription - 12/15/18 1400      Date of Initial Exercise RX and Referring Provider   Date  12/15/18    Referring Provider  Frazier Richards MD      Treadmill   MPH  0.8    Grade  0    Minutes  15    METs  1.6      NuStep   Level  4    SPM  80    Minutes  15    METs  1.5      Biostep-RELP   Level  1    SPM  50    Minutes  15    METs  1      Prescription Details   Frequency (times per week)  2    Duration  Progress to 30 minutes of continuous aerobic without signs/symptoms of physical distress      Intensity   THRR 40-80% of Max Heartrate  100-127    Ratings of Perceived Exertion  11-13    Perceived Dyspnea  0-4      Progression   Progression  Continue to progress workloads to maintain intensity without signs/symptoms of physical distress.      Resistance Training   Training Prescription  Yes    Weight  3 lbs    Reps  10-15       Perform Capillary Blood Glucose checks as needed.  Exercise Prescription Changes: Exercise Prescription Changes    Row Name 12/15/18 1300 12/24/18 0900 05/11/19 1000 05/11/19 1100       Response to Exercise   Blood Pressure (Admit)  130/68  118/72  118/60  -    Blood Pressure (Exercise)  138/72  140/70  142/70  -    Blood  Pressure (Exit)  126/64  132/70  124/60  -    Heart Rate (Admit)  72 bpm  47 bpm  69 bpm  -    Heart Rate (Exercise)  103 bpm  88 bpm  105 bpm  -    Heart Rate (Exit)  69 bpm  58 bpm  81 bpm  -    Oxygen Saturation (Admit)  100 %  -  -  -    Oxygen Saturation (Exercise)  100 %  -  -  -    Rating of Perceived Exertion (Exercise)  _0 -    Symptoms  shuffling gait, unsteady, forward lean, legs feel week, dizzy, slight chest pain  fatigue  fatigue  -    Comments  walk test results  first full day of exercise  barely able to keep machines on  -    Duration  -  Progress to 30 minutes of  aerobic without signs/symptoms of physical distress  Progress to 30 minutes of  aerobic without signs/symptoms of physical distress  -    Intensity  -  THRR unchanged  THRR unchanged  -      Progression   Progression  -  Continue to progress workloads to maintain intensity without signs/symptoms of physical distress.  Continue to progress workloads to maintain intensity without signs/symptoms of physical distress.  -    Average METs  -  1.1  1  -      Resistance Training   Training Prescription  -  Yes  Yes  -    Weight  -  3 lbs  2 lbs  -    Reps  -  10-15  10-15  -      Interval Training   Interval Training  -  No  No  -      NuStep   Level  -  1  -  -    Minutes  -  15  -  -    METs  -  1.1  -  -      Arm Ergometer   Level  -  -  1  -    Minutes  -  -  15  -    METs  -  -  1  -      Biostep-RELP   Level  -  1  1  -    Minutes  -  15  15  -    METs  -  1  1  -      Home Exercise Plan   Plans to continue exercise at  -  Home (comment) walking  -  Home (comment) walking    Frequency  -  Add 2 additional days to program exercise sessions.  -  Add 2 additional days to program exercise sessions.    Initial Home Exercises Provided  -  12/23/18 sent in mail  -  12/23/18 sent in mail       Exercise Comments: Exercise Comments    Row Name 12/18/18 1023           Exercise Comments    First full day of exercise!  Patient was oriented to gym and equipment including functions, settings, policies, and procedures.  Patient's individual exercise prescription and treatment plan were reviewed.  All starting workloads were established based on the results of the 6 minute walk test done at initial orientation visit.  The plan for exercise progression was also introduced and progression will be customized based on patient's performance and goals.          Exercise Goals and Review: Exercise Goals    Row Name 12/15/18 1415             Exercise Goals   Increase Physical Activity  Yes       Intervention  Provide advice, education, support and counseling about physical activity/exercise needs.;Develop an individualized exercise prescription for aerobic and resistive training based on initial evaluation findings, risk stratification, comorbidities and participant's personal goals.       Expected Outcomes  Short Term: Attend rehab on a regular basis to increase amount of physical activity.;Long Term: Add in home exercise to make exercise part of routine and to increase amount of physical activity.;Long Term: Exercising regularly at least 3-5 days a week.       Increase Strength and Stamina  Yes       Intervention  Provide advice, education, support and counseling about physical activity/exercise needs.;Develop an individualized exercise prescription for aerobic and resistive training based on initial evaluation findings, risk stratification, comorbidities and participant's personal goals.       Expected Outcomes  Short Term: Increase workloads from initial exercise prescription for resistance, speed, and METs.;Short Term: Perform resistance training exercises routinely during rehab and add in resistance training at home;Long Term: Improve cardiorespiratory fitness, muscular endurance and strength as measured by increased METs and functional capacity (6MWT)       Able to understand and use rate  of perceived exertion (RPE) scale  Yes       Intervention  Provide education and explanation on how to use RPE scale       Expected Outcomes  Long Term:  Able to use RPE to guide intensity level when exercising independently;Short Term: Able to use RPE daily in rehab to express subjective intensity level       Knowledge and understanding of Target Heart Rate Range (THRR)  Yes       Intervention  Provide education and explanation of THRR including how the numbers were predicted and where they are located for reference       Expected Outcomes  Short Term: Able to state/look up THRR;Short Term: Able to use daily as guideline for intensity in rehab;Long Term: Able to use THRR to govern intensity when exercising independently       Able to check pulse independently  Yes       Intervention  Provide education and demonstration on how to check pulse in carotid and radial arteries.;Review the importance of being able to check your own pulse for safety during independent exercise       Expected Outcomes  Short Term: Able to explain why pulse checking is important during independent exercise;Long Term: Able to check pulse independently and accurately       Understanding of Exercise Prescription  Yes       Intervention  Provide education, explanation, and written materials on patient's individual exercise prescription       Expected Outcomes  Short Term: Able to explain program exercise prescription;Long Term: Able to explain home exercise prescription to exercise independently          Exercise Goals Re-Evaluation : Exercise Goals Re-Evaluation    Row Name 12/18/18 1023 12/24/18 0859 12/26/18 1333 01/01/19 1148  01/26/19 1139     Exercise Goal Re-Evaluation   Exercise Goals Review  Increase Physical Activity;Increase Strength and Stamina;Able to understand and use rate of perceived exertion (RPE) scale;Knowledge and understanding of Target Heart Rate Range (THRR);Understanding of Exercise Prescription   Increase Physical Activity;Increase Strength and Stamina;Understanding of Exercise Prescription  Increase Physical Activity;Increase Strength and Stamina;Understanding of Exercise Prescription  Increase Physical Activity;Increase Strength and Stamina;Understanding of Exercise Prescription  Increase Physical Activity;Increase Strength and Stamina;Understanding of Exercise Prescription   Comments  Reviewed RPE scale, THR and program prescription with pt today.  Pt voiced understanding and was given a copy of goals to take home.   Jailah has completed her first full day of exercise.  She does not have access to email so we have sent out her home exercise guidelines in the mail and will do a follow up phone calls.  We will continue to monitor her progress at home.   Rhea is still waiting on her packet.  She is eager to get started and has started to walk some already.  She says she will call if she has any questions.   Lenisha is still waiting on her packet.  She thinks her daughter may have overlooked it and not brought it over.  She is eager to get started.  We talked about starting to walk inside and doing some warm up exercises.   Tija has been walking at home some.  She has continued to have some dizzy spells and was evaulated by her cardiologist.  She is now scheduled for a nuclear stress test next week to see if there have been any changes since last year.  She is also wearing a holter monitor.  She enjoys her walks, especially when able to go with her daughter.    Expected Outcomes  Short: Use RPE daily to regulate intensity. Long: Follow program prescription in THR.  Short: Start to exercise by walking at home. Long: Continue to increase activity levels.   Short: Continue to walk at home.  Long: Continue to increase activity levels.   Short: Start to walk and do warm up exercises.  Long: Continue to move more  Short: Continue to walk and do well on stress test.  Long: Continue to get out to move.    [place] Name  02/18/19 1115 05/04/19 1621 05/07/19 1421         Exercise Goal Re-Evaluation   Exercise Goals Review  Increase Physical Activity;Increase Strength and Stamina;Understanding of Exercise Prescription  -  Increase Physical Activity;Increase Strength and Stamina;Understanding of Exercise Prescription     Comments  Makeshia has been walking at home.  She would like to be able to get our videos.  She will have her daughter email [place] with contact info for her.  She did well on her stress test and they could not determine why she was getting her dizzy spells.  They believe it is a combination of serveral things and will continue with medical management. Recently, she has been feeling better.  -  Manika has been walking some but not much. She returned this week and felt pretty good after her first.   She is hoping that getting back to rehab will help regain strength.  We talked about using a walker versus cane and to build up to 10 min walking bouts.     Expected Outcomes  Short: Continue to walk daily and get emails of our videos.  Long: Continue to increase activity levels.   -  Short: Walk more at home and use walker.  Long: Continue to increase strength and stamina.        Discharge Exercise Prescription (Final Exercise Prescription Changes): Exercise Prescription Changes - 05/11/19 1100      Home Exercise Plan   Plans to continue exercise at  Home (comment)   walking   Frequency  Add 2 additional days to program exercise sessions.    Initial Home Exercises Provided  12/23/18   sent in mail      Nutrition:  Target Goals: Understanding of nutrition guidelines, daily intake of sodium <1567m, cholesterol <2093m calories 30% from fat and 7% or less from saturated fats, daily to have 5 or more servings of fruits and vegetables.  Biometrics: Pre Biometrics - 12/15/18 1416      Pre Biometrics   Height  5' 1.1" (1.552 m)    Weight  167 lb (75.8 kg)    Waist Circumference  36.5 inches    Hip  Circumference  44 inches    Waist to Hip Ratio  0.83 %    BMI (Calculated)  31.45    Single Leg Stand  0 seconds        Nutrition Therapy Plan and Nutrition Goals: Nutrition Therapy & Goals - 02/03/19 1328      Nutrition Therapy   Diet  low Na, Heart healthy diet    Protein (specify units)  ~60g    Fiber  25 grams    Whole Grain Foods  3 servings    Saturated Fats  12 max. grams    Fruits and Vegetables  5 servings/day    Sodium  1.5 grams      Personal Nutrition Goals   Nutrition Goal  ST: pt wants to start eating 1 serving of greens /day at least 5x/week ST: eat vitamin c rich foods with meals ST: increase fiber for better bowel health and regularity (prunes and other F/V) ST: limit black tea consumption around mealtime LT:  to be able to walk without shuffling feet with cane LT: be more empowered with healthy eating (her normal) LT: No longer be anemic    Comments  Pt would benefit from in-person/ visual education. Had RD in past showed portions and wants to know what to eat. She is currently anemic and is on iron pills; stomach pains. Raisin bran in the mornning. Yogurt at night. chicken and rice (pts favortie) with beans and tossed salad with squash casserole with cheese for her birthday. pt reports eating a lot of cheese and will eat a cheese sandwich when all else fails for lunch.  will go to farm to get cabbage, potatoes. Pt reports that is aware what to eat, but isn't really doing it. Discussed MyPlate and general healthy eating. Discussed the importance of fiber and how to increase iron absorption.       Intervention Plan   Intervention  Prescribe, educate and counsel regarding individualized specific dietary modifications aiming towards targeted core components such as weight, hypertension, lipid management, diabetes, heart failure and other comorbidities.    Expected Outcomes  Short Term Goal: Understand basic principles of dietary content, such as calories, fat, sodium,  cholesterol and nutrients.;Short Term Goal: A plan has been developed with personal nutrition goals set during dietitian appointment.;Long Term Goal: Adherence to prescribed nutrition plan.       Nutrition Assessments: Nutrition Assessments - 12/15/18 1302      MEDFICTS Scores   Pre Score  48  Nutrition Goals Re-Evaluation: Nutrition Goals Re-Evaluation    [place] Name 03/18/19 1332 05/04/19 1737           Goals   Nutrition Goal  ST: pt wants to start eating 1 serving of greens /day at least 5x/week LT: be more empowered with healthy eating (her normal) LT: No longer be anemic  ST: pt wants to start eating 1 serving of greens /day at least 5x/week LT: be more empowered with healthy eating (her normal) LT: No longer be anemic      Comment  Been putting off the greens because was nervous about her blood, but spoke with the dr and got ok from him so will now try to implement that. She says she knows what she needs to do, but hasn't done anything yet.  Pt wants some nutrition content, has not gotten her daughters email, told her will call back to get that from her so that we can send her over some content.   She will eat turnip greens when she goes out but [employer] much at home. Gave pt HH eating guidelines to review as we discussed during virtual rehab. Pt 1st day back, will touch base to talk more.      Expected Outcome  Pt no longer anemic, pt empowered with healthy eating  Pt no longer anemic, pt empowered with healthy eating         Nutrition Goals Discharge (Final Nutrition Goals Re-Evaluation): Nutrition Goals Re-Evaluation - 05/04/19 1737      Goals   Nutrition Goal  ST: pt wants to start eating 1 serving of greens /day at least 5x/week LT: be more empowered with healthy eating (her normal) LT: No longer be anemic    Comment  She will eat turnip greens when she goes out but [employer] much at home. Gave pt HH eating guidelines to review as we discussed during virtual rehab. Pt 1st  day back, will touch base to talk more.    Expected Outcome  Pt no longer anemic, pt empowered with healthy eating       Psychosocial: Target Goals: Acknowledge presence or absence of significant depression and/or stress, maximize coping skills, provide positive support system. Participant is able to verbalize types and ability to use techniques and skills needed for reducing stress and depression.   Initial Review & Psychosocial Screening: Initial Psych Review & Screening - 12/15/18 1300      Initial Review   Current issues with  Current Stress Concerns    Source of Stress Concerns  Transportation;Unable to participate in former interests or hobbies    Comments  Anajah relies on family and friend/care giver to take her places. Sometimes she feels bad asking them for help. She does have a good support system and people constantly checking up on her.       Family Dynamics   Good Support System?  Yes   family member, friend     Barriers   Psychosocial barriers to participate in program  There are no identifiable barriers or psychosocial needs.;The patient should benefit from training in stress management and relaxation.      Screening Interventions   Interventions  Encouraged to exercise;Program counselor consult;Provide feedback about the scores to participant;To provide support and resources with identified psychosocial needs    Expected Outcomes  Short Term goal: Utilizing psychosocial counselor, staff and physician to assist with identification of specific Stressors or current issues interfering with healing process. Setting desired goal for each stressor  or current issue identified.;Long Term Goal: Stressors or current issues are controlled or eliminated.;Short Term goal: Identification and review with participant of any Quality of Life or Depression concerns found by scoring the questionnaire.;Long Term goal: The participant improves quality of Life and PHQ9 Scores as seen by post scores  and/or verbalization of changes       Quality of Life Scores:  Quality of Life - 12/15/18 1041      Quality of Life   Select  Quality of Life      Quality of Life Scores   Health/Function Pre  18.3 %    Socioeconomic Pre  25.1 %    Psych/Spiritual Pre  24 %    Family Pre  22.5 %    GLOBAL Pre  21.23 %      Scores of 19 and below usually indicate a poorer quality of life in these areas.  A difference of  2-3 points is a clinically meaningful difference.  A difference of 2-3 points in the total score of the Quality of Life Index has been associated with significant improvement in overall quality of life, self-image, physical symptoms, and general health in studies assessing change in quality of life.  PHQ-9: Recent Review Flowsheet Data    Depression screen [facility] 2/9 12/15/2018   Decreased Interest 0   Down, Depressed, Hopeless 0   PHQ - 2 Score 0   Altered sleeping 2   Tired, decreased energy 3   Change in appetite 0   Feeling bad or failure about yourself  0   Trouble concentrating 1   Moving slowly or fidgety/restless 2   Suicidal thoughts 0   PHQ-9 Score 8   Difficult doing work/chores Somewhat difficult     Interpretation of Total Score  Total Score Depression Severity:  1-4 = Minimal depression, 5-9 = Mild depression, 10-14 = Moderate depression, 15-19 = Moderately severe depression, 20-27 = Severe depression   Psychosocial Evaluation and Intervention: Psychosocial Evaluation - 12/18/18 1041      Psychosocial Evaluation & Interventions   Interventions  Encouraged to exercise with the program and follow exercise prescription    Comments  Counselor met with patient and caregiver Vaughan Basta.  Patient reported having two stents placed May 2019 and having had 'dizzy spells' since them.  Per patient and caregiver report, they asked physician about resources for patient to improve ability to [facility] and walk with more confidence which led them to this program.  Patient reports  having a fear of falling due to lack of stability and having a history of falling before.  Support system includes her sister, brother in Sports coach, caregiver, daughter [name], and extended family and church support.  Patient does live alone.  No health concerns to report, feels that she sleeps and eats well.  Reported history of sleep apnea yet not currently using a machine.    Expected Outcomes  Short: increase exercise; long: improve balance and confidence in walking    Continue Psychosocial Services   Follow up required by staff       Psychosocial Re-Evaluation: Psychosocial Re-Evaluation    [place] Name 01/01/19 1155 01/26/19 1140 02/18/19 1118 05/07/19 1427       Psychosocial Re-Evaluation   Current issues with  Current Stress Concerns;Current Sleep Concerns  Current Stress Concerns;Current Sleep Concerns  Current Stress Concerns;Current Sleep Concerns  Current Stress Concerns    Comments  Leonette is doing well at home. She really wants to get started on her home exercise but  has not gotten her packet yet.  She thinks her daughter may have it but doesn't know it.  She has switched her lasix to daytime and is now sleeping better.  She still has to get up but not as frequently.  She has noted that sometimes she forget to take it and when she does, her weight goes up.  Overall, she is staying postive.   Courtnay is doing well at home mentally.  She has had some dizzy spells and is working with her cardiologist on this.  She is wearing a holter monitor and has a nuclear stress test coming up next week.  This week she is celebrating her 78th birthday!!  She is excited to reach this milestones as her parents both died in their 76s.  Her family sent her cake and dinner this past weekend to celebrate her!  Janet continues do well at home.  Her dizzy spells are getting better.  She still talks with her family regularly.  She is looking forward to getting back to class!  Chelbie is doing well.  She is excited to be back in class  to regain her strength.  She is sleeping well.    Expected Outcomes  Short: Remeber to take meds daily and get packet from daughter.  Long: Continue to stay postive.   Short: Enjoy her birthday.  Long: Continue to stay positive.   Short: Continue to feel better. Long: Continue to stay positive.   Short: Continue to feel better. Long: Continue to stay positive.     Interventions  Encouraged to attend Cardiac Rehabilitation for the exercise  Encouraged to attend Cardiac Rehabilitation for the exercise  -  Encouraged to attend Cardiac Rehabilitation for the exercise    Continue Psychosocial Services   Follow up required by staff  Follow up required by staff  -  -    Comments  Karington relies on family and friend/care giver to take her places. Sometimes she feels bad asking them for help. She does have a good support system and people constantly checking up on her.   -  -  -      Initial Review   Source of Stress Concerns  Transportation;Unable to participate in former interests or hobbies  -  -  -       Psychosocial Discharge (Final Psychosocial Re-Evaluation): Psychosocial Re-Evaluation - 05/07/19 1427      Psychosocial Re-Evaluation   Current issues with  Current Stress Concerns    Comments  Vernette is doing well.  She is excited to be back in class to regain her strength.  She is sleeping well.    Expected Outcomes  Short: Continue to feel better. Long: Continue to stay positive.     Interventions  Encouraged to attend Cardiac Rehabilitation for the exercise       Vocational Rehabilitation: Provide vocational rehab assistance to qualifying candidates.   Vocational Rehab Evaluation & Intervention: Vocational Rehab - 12/15/18 1300      Initial Vocational Rehab Evaluation & Intervention   Assessment shows need for Vocational Rehabilitation  No       Education: Education Goals: Education classes will be provided on a variety of topics geared toward better understanding of heart health and risk  factor modification. Participant will state understanding/return demonstration of topics presented as noted by education test scores.  Learning Barriers/Preferences: Learning Barriers/Preferences - 12/15/18 1258      Learning Barriers/Preferences   Learning Barriers  None    Learning Preferences  Individual Instruction       Education Topics:  AED/CPR: - Group verbal and written instruction with the use of models to demonstrate the basic use of the AED with the basic ABC's of resuscitation.   General Nutrition Guidelines/Fats and Fiber: -Group instruction provided by verbal, written material, models and posters to present the general guidelines for heart healthy nutrition. Gives an explanation and review of dietary fats and fiber.   Controlling Sodium/Reading Food Labels: -Group verbal and written material supporting the discussion of sodium use in heart healthy nutrition. Review and explanation with models, verbal and written materials for utilization of the food label.   Cardiac Rehab from 12/18/2018 in [facility] Cardiac and Pulmonary Rehab  Date  12/18/18  Educator  [facility]  Instruction Review Code  1- Verbalizes Understanding      Exercise Physiology & General Exercise Guidelines: - Group verbal and written instruction with models to review the exercise physiology of the cardiovascular system and associated critical values. Provides general exercise guidelines with specific guidelines to those with heart or lung disease.    Aerobic Exercise & Resistance Training: - Gives group verbal and written instruction on the various components of exercise. Focuses on aerobic and resistive training programs and the benefits of this training and how to safely progress through these programs..   Flexibility, Balance, Mind/Body Relaxation: Provides group verbal/written instruction on the benefits of flexibility and balance training, including mind/body exercise modes such as yoga, pilates and tai  chi.  Demonstration and skill practice provided.   Stress and Anxiety: - Provides group verbal and written instruction about the health risks of elevated stress and causes of high stress.  Discuss the correlation between heart/lung disease and anxiety and treatment options. Review healthy ways to manage with stress and anxiety.   Depression: - Provides group verbal and written instruction on the correlation between heart/lung disease and depressed mood, treatment options, and the stigmas associated with seeking treatment.   Anatomy & Physiology of the Heart: - Group verbal and written instruction and models provide basic cardiac anatomy and physiology, with the coronary electrical and arterial systems. Review of Valvular disease and Heart Failure   Cardiac Procedures: - Group verbal and written instruction to review commonly prescribed medications for heart disease. Reviews the medication, class of the drug, and side effects. Includes the steps to properly store meds and maintain the prescription regimen. (beta blockers and nitrates)   Cardiac Medications I: - Group verbal and written instruction to review commonly prescribed medications for heart disease. Reviews the medication, class of the drug, and side effects. Includes the steps to properly store meds and maintain the prescription regimen.   Cardiac Medications II: -Group verbal and written instruction to review commonly prescribed medications for heart disease. Reviews the medication, class of the drug, and side effects. (all other drug classes)    Go Sex-Intimacy & Heart Disease, Get SMART - Goal Setting: - Group verbal and written instruction through game format to discuss heart disease and the return to sexual intimacy. Provides group verbal and written material to discuss and apply goal setting through the application of the S.M.A.R.T. Method.   Other Matters of the Heart: - Provides group verbal, written materials and  models to describe Stable Angina and Peripheral Artery. Includes description of the disease process and treatment options available to the cardiac patient.   Exercise & Equipment Safety: - Individual verbal instruction and demonstration of equipment use and safety with use of the equipment.   Cardiac  Rehab from 12/18/2018 in [facility] Cardiac and Pulmonary Rehab  Date  12/15/18  Educator  [facility]  Instruction Review Code  1- Verbalizes Understanding      Infection Prevention: - Provides verbal and written material to individual with discussion of infection control including proper hand washing and proper equipment cleaning during exercise session.   Cardiac Rehab from 12/18/2018 in [facility] Cardiac and Pulmonary Rehab  Date  12/15/18  Educator  [facility]  Instruction Review Code  1- Verbalizes Understanding      Falls Prevention: - Provides verbal and written material to individual with discussion of falls prevention and safety.   Cardiac Rehab from 12/18/2018 in [facility] Cardiac and Pulmonary Rehab  Date  12/15/18  Educator  [facility]  Instruction Review Code  1- Verbalizes Understanding      Diabetes: - Individual verbal and written instruction to review signs/symptoms of diabetes, desired ranges of glucose level fasting, after meals and with exercise. Acknowledge that pre and post exercise glucose checks will be done for 3 sessions at entry of program.   Know Your Numbers and Risk Factors: -Group verbal and written instruction about important numbers in your health.  Discussion of what are risk factors and how they play a role in the disease process.  Review of Cholesterol, Blood Pressure, Diabetes, and BMI and the role they play in your overall health.   Sleep Hygiene: -Provides group verbal and written instruction about how sleep can affect your health.  Define sleep hygiene, discuss sleep cycles and impact of sleep habits. Review good sleep hygiene tips.    Other: -Provides group and verbal instruction  on various topics (see comments)   Knowledge Questionnaire Score: Knowledge Questionnaire Score - 12/15/18 1259      Knowledge Questionnaire Score   Pre Score  21/26   correct answers reviewed with Remo Lipps. Focus on nutrition and exercise      Core Components/Risk Factors/Patient Goals at Admission: Personal Goals and Risk Factors at Admission - 12/15/18 1256      Core Components/Risk Factors/Patient Goals on Admission    Weight Management  Yes;Weight Loss    Intervention  Weight Management: Develop a combined nutrition and exercise program designed to reach desired caloric intake, while maintaining appropriate intake of nutrient and fiber, sodium and fats, and appropriate energy expenditure required for the weight goal.;Weight Management: Provide education and appropriate resources to help participant work on and attain dietary goals.;Weight Management/Obesity: Establish reasonable short term and long term weight goals.    Admit Weight  167 lb (75.8 kg)    Goal Weight: Short Term  163 lb (73.9 kg)    Goal Weight: Long Term  150 lb (68 kg)    Expected Outcomes  Short Term: Continue to assess and modify interventions until short term weight is achieved;Long Term: Adherence to nutrition and physical activity/exercise program aimed toward attainment of established weight goal;Weight Loss: Understanding of general recommendations for a balanced deficit meal plan, which promotes 1-2 lb weight loss per week and includes a negative energy balance of [phone] kcal/d;Understanding recommendations for meals to include 15-35% energy as protein, 25-35% energy from fat, 35-60% energy from carbohydrates, less than 242m of dietary cholesterol, 20-35 gm of total fiber daily;Understanding of distribution of calorie intake throughout the day with the consumption of 4-5 meals/snacks    Hypertension  Yes    Intervention  Provide education on lifestyle modifcations including regular physical activity/exercise,  weight management, moderate sodium restriction and increased consumption of fresh fruit, vegetables, and low  fat dairy, alcohol moderation, and smoking cessation.;Monitor prescription use compliance.    Expected Outcomes  Short Term: Continued assessment and intervention until BP is < 140/27m HG in hypertensive participants. < 130/841mHG in hypertensive participants with diabetes, heart failure or chronic kidney disease.;Long Term: Maintenance of blood pressure at goal levels.    Lipids  Yes    Intervention  Provide education and support for participant on nutrition & aerobic/resistive exercise along with prescribed medications to achieve LDL <707mHDL >78m26m  Expected Outcomes  Short Term: Participant states understanding of desired cholesterol values and is compliant with medications prescribed. Participant is following exercise prescription and nutrition guidelines.;Long Term: Cholesterol controlled with medications as prescribed, with individualized exercise RX and with personalized nutrition plan. Value goals: LDL < 70mg61mL > 40 mg.       Core Components/Risk Factors/Patient Goals Review:  Goals and Risk Factor Review    Row Name 12/26/18 1335 01/01/19 1203 01/26/19 1142 02/18/19 1119 05/07/19 1428     Core Components/Risk Factors/Patient Goals Review   Personal Goals Review  Weight Management/Obesity;Hypertension;Lipids  Weight Management/Obesity;Hypertension;Lipids  Weight Management/Obesity;Hypertension;Lipids  Weight Management/Obesity;Hypertension;Lipids  Weight Management/Obesity;Hypertension;Lipids   Review  Anelia Jaslinenot been watching her weight at home, but will start to do so at least every other day.  She has not been taking her blood pressure despite having a new cuff from her daughter.  She will have her daughter show her how to use the cuff so that she can start to monitor her pressures at home.  Currently, she feels good and thinks that she will be able to exercise at home.    Josceline Camylanot been able to weigh her self as she does not have a scale at home.  We will see if we can get help from the foundation for this.  She forgot to have her daugter check her pressure last time but will get out the cuff to remind her to do it.  She is eager to get started on her home exercise.    Shirrell Aftonoing well at home.  She is still not able to weight as she doesn't have a scale.  Her sister brought her a blood pressure cuff, but she is not sure how to use it.  We will continue to check in with her as she is eager to get back to classes.   Siniyah Sydelleeling better.  Seh still is not checking her weight or blood pressure at home since she does not have the equipment.  She had good numbers when she went to the doctor.   Aiyanna's weight has gone up some but overall doing well.  Her pressures have been good in class.  She will be able to keep a better eye on these now that she is back in class.   Expected Outcomes  Short: Start checking weight and blood pressures at home.  Long: Continue to monitor risk factors.   Short: Start checking blood pressues.  Long: Continue to monitor risk factors.   Short: Continue to watch that she doesn't feel like she gains too much weight.  Long: Continue to monitor symptoms of risk factors.   Short: Continue to exercise to maintain weight and monitor symptoms.  Long: Continue to monitor risk factors at appointments.   -      Core Components/Risk Factors/Patient Goals at Discharge (Final Review):  Goals and Risk Factor Review - 05/07/19 1428      Core Components/Risk Factors/Patient  Goals Review   Personal Goals Review  Weight Management/Obesity;Hypertension;Lipids    Review  Nicolas's weight has gone up some but overall doing well.  Her pressures have been good in class.  She will be able to keep a better eye on these now that she is back in class.       ITP Comments: ITP Comments    Row Name 12/15/18 1228 12/26/18 1330 12/31/18 1345 04/22/19 1341 05/04/19 1621    ITP Comments  Med Review completed. Initial ITP created. Diagnosis can be found in [facility] 02/11/18  Our program is currently closed due to COVID-19.  We are communicating with patient via phone calls and emails.   30 day review completed. Continue with ITP unless directed changes by Medical Director at review  30 day review cycle restarting  after being closed since March 16 because of  Covid 19 pandemic. Program opened to patients on July 6. Not all have returned. ITP updated and sent to Medical Director for review,changes as needed and signature  -   Remy Name 05/20/19 0613           ITP Comments  30 Day Review Completed today. Continue with ITP unless changed by Medical Director review.          Comments:

## 2019-05-21 ENCOUNTER — Encounter: Payer: Medicare Other | Admitting: *Deleted

## 2019-05-21 ENCOUNTER — Other Ambulatory Visit: Payer: Self-pay

## 2019-05-21 DIAGNOSIS — Z9861 Coronary angioplasty status: Secondary | ICD-10-CM

## 2019-05-21 NOTE — Progress Notes (Signed)
Daily Session Note  Patient Details  Name: Melinda Maxwell MRN: [id number] Date of Birth: 17-May-1941 Referring Provider:     Cardiac Rehab from 12/15/2018 in [facility] Cardiac and Pulmonary Rehab  Referring Provider  Frazier Richards MD      Encounter Date: 05/21/2019  Check In: Session Check In - 05/21/19 1401      Check-In   Supervising physician immediately available to respond to emergencies  See telemetry face sheet for immediately available ER MD    Location  [facility]-Cardiac & Pulmonary Rehab    Staff Present  Renita Papa, RN BSN;Joseph [address] [place], [place], RCEP, CCRP, CCET    Virtual Visit  No    Medication changes reported      No    Fall or balance concerns reported     No    Warm-up and Cool-down  Performed on first and last piece of equipment    Resistance Training Performed  Yes    VAD Patient?  No    PAD/SET Patient?  No      Pain Assessment   Currently in Pain?  No/denies          Social History   Tobacco Use  Smoking Status Never Smoker  Smokeless Tobacco Never Used    Goals Met:  Independence with exercise equipment Exercise tolerated well No report of cardiac concerns or symptoms Strength training completed today  Goals Unmet:  Not Applicable  Comments: Pt able to follow exercise prescription today without complaint.  Will continue to monitor for progression.    Dr. Emily Filbert is Medical Director for [place] and [facility].

## 2019-05-25 ENCOUNTER — Encounter: Payer: Medicare Other | Admitting: *Deleted

## 2019-05-25 ENCOUNTER — Other Ambulatory Visit: Payer: Self-pay

## 2019-05-25 DIAGNOSIS — Z9861 Coronary angioplasty status: Secondary | ICD-10-CM

## 2019-05-25 NOTE — Progress Notes (Signed)
Daily Session Note  Patient Details  Name: Melinda Maxwell MRN: [id number] Date of Birth: May 27, 1941 Referring Provider:     Cardiac Rehab from 12/15/2018 in [facility] Cardiac and Pulmonary Rehab  Referring Provider  Frazier Richards MD      Encounter Date: 05/25/2019  Check In: Session Check In - 05/25/19 1635      Check-In   Supervising physician immediately available to respond to emergencies  See telemetry face sheet for immediately available ER MD    Location  [facility]-Cardiac & Pulmonary Rehab    Staff Present  Heath Lark, RN, BSN, CCRP;Jeanna Durrell BS, Exercise Physiologist;Kelly Amedeo Plenty, BS, ACSM CEP, Exercise Physiologist    Virtual Visit  No    Medication changes reported      No    Fall or balance concerns reported     No    Warm-up and Cool-down  Performed on first and last piece of equipment    Resistance Training Performed  Yes    VAD Patient?  No    PAD/SET Patient?  No      Pain Assessment   Currently in Pain?  No/denies          Social History   Tobacco Use  Smoking Status Never Smoker  Smokeless Tobacco Never Used    Goals Met:  Independence with exercise equipment Exercise tolerated well No report of cardiac concerns or symptoms Strength training completed today  Goals Unmet:  Not Applicable  Comments: Pt able to follow exercise prescription today without complaint.  Will continue to monitor for progression.    Dr. Emily Filbert is Medical Director for [place] and [facility].

## 2019-05-28 ENCOUNTER — Other Ambulatory Visit: Payer: Self-pay

## 2019-05-28 DIAGNOSIS — Z9861 Coronary angioplasty status: Secondary | ICD-10-CM

## 2019-05-28 NOTE — Progress Notes (Signed)
Daily Session Note  Patient Details  Name: Melinda Maxwell MRN: [id number] Date of Birth: [birth year] Referring Provider:     Cardiac Rehab from 12/15/2018 in [facility] Cardiac and Pulmonary Rehab  Referring Provider  Frazier Richards MD      Encounter Date: 05/28/2019  Check In:      Social History   Tobacco Use  Smoking Status Never Smoker  Smokeless Tobacco Never Used    Goals Met:  Independence with exercise equipment Exercise tolerated well No report of cardiac concerns or symptoms Strength training completed today  Goals Unmet:  Not Applicable  Comments: Pt able to follow exercise prescription today without complaint.  Will continue to monitor for progression.    Dr. Emily Filbert is Medical Director for [place] and [facility].

## 2019-06-01 ENCOUNTER — Encounter: Payer: Medicare Other | Admitting: *Deleted

## 2019-06-01 ENCOUNTER — Other Ambulatory Visit: Payer: Self-pay

## 2019-06-01 DIAGNOSIS — Z9861 Coronary angioplasty status: Secondary | ICD-10-CM | POA: Diagnosis not present

## 2019-06-01 NOTE — Progress Notes (Signed)
Daily Session Note  Patient Details  Name: Melinda Maxwell MRN: [id number] Date of Birth: 03-Mar-1941 Referring Provider:     Cardiac Rehab from 12/15/2018 in [facility] Cardiac and Pulmonary Rehab  Referring Provider  Frazier Richards MD      Encounter Date: 06/01/2019  Check In: Session Check In - 06/01/19 1610      Check-In   Supervising physician immediately available to respond to emergencies  See telemetry face sheet for immediately available ER MD    Location  [facility]-Cardiac & Pulmonary Rehab    Staff Present  Renita Papa, RN Moises Blood, BS, ACSM CEP, Exercise Physiologist;Jeanna Durrell BS, Exercise Physiologist    Virtual Visit  No    Medication changes reported      No    Fall or balance concerns reported     No    Warm-up and Cool-down  Performed on first and last piece of equipment    Resistance Training Performed  Yes    VAD Patient?  No    PAD/SET Patient?  No      Pain Assessment   Currently in Pain?  No/denies          Social History   Tobacco Use  Smoking Status Never Smoker  Smokeless Tobacco Never Used    Goals Met:  Independence with exercise equipment Exercise tolerated well No report of cardiac concerns or symptoms Strength training completed today  Goals Unmet:  Not Applicable  Comments: Pt able to follow exercise prescription today without complaint.  Will continue to monitor for progression.    Dr. Emily Filbert is Medical Director for [place] and [facility].

## 2019-06-04 ENCOUNTER — Encounter: Payer: Medicare Other | Admitting: *Deleted

## 2019-06-04 ENCOUNTER — Other Ambulatory Visit: Payer: Self-pay

## 2019-06-04 DIAGNOSIS — Z9861 Coronary angioplasty status: Secondary | ICD-10-CM | POA: Diagnosis not present

## 2019-06-04 NOTE — Progress Notes (Signed)
Daily Session Note  Patient Details  Name: Melinda Maxwell MRN: [id number] Date of Birth: 1941-05-09 Referring Provider:     Cardiac Rehab from 12/15/2018 in [facility] Cardiac and Pulmonary Rehab  Referring Provider  Frazier Richards MD      Encounter Date: 06/04/2019  Check In: Session Check In - 06/04/19 1407      Check-In   Supervising physician immediately available to respond to emergencies  See telemetry face sheet for immediately available ER MD    Location  [facility]-Cardiac & Pulmonary Rehab    Staff Present  Renita Papa, RN BSN;Jessica Luan Pulling, MA, RCEP, CCRP, CCET;Jeanna Durrell BS, Exercise Physiologist    Virtual Visit  No    Medication changes reported      No    Fall or balance concerns reported     No    Warm-up and Cool-down  Performed on first and last piece of equipment    Resistance Training Performed  Yes    VAD Patient?  No    PAD/SET Patient?  No      Pain Assessment   Currently in Pain?  No/denies          Social History   Tobacco Use  Smoking Status Never Smoker  Smokeless Tobacco Never Used    Goals Met:  Independence with exercise equipment Exercise tolerated well No report of cardiac concerns or symptoms Strength training completed today  Goals Unmet:  Not Applicable  Comments: Pt able to follow exercise prescription today without complaint.  Will continue to monitor for progression.    Dr. Emily Filbert is Medical Director for [place] and [facility].

## 2019-06-08 ENCOUNTER — Encounter: Payer: Medicare Other | Admitting: *Deleted

## 2019-06-08 ENCOUNTER — Other Ambulatory Visit: Payer: Self-pay

## 2019-06-08 DIAGNOSIS — Z9861 Coronary angioplasty status: Secondary | ICD-10-CM | POA: Diagnosis not present

## 2019-06-08 NOTE — Progress Notes (Signed)
Daily Session Note  Patient Details  Name: JOICE NAZARIO MRN: [id number] Date of Birth: 04-26-1941 Referring Provider:     Cardiac Rehab from 12/15/2018 in [facility] Cardiac and Pulmonary Rehab  Referring Provider  Frazier Richards MD      Encounter Date: 06/08/2019  Check In: Session Check In - 06/08/19 1619      Check-In   Supervising physician immediately available to respond to emergencies  See telemetry face sheet for immediately available ER MD    Location  [facility]-Cardiac & Pulmonary Rehab    Staff Present  Renita Papa, RN Moises Blood, BS, ACSM CEP, Exercise Physiologist;Jeanna Durrell BS, Exercise Physiologist    Virtual Visit  No    Medication changes reported      No    Fall or balance concerns reported     No    Warm-up and Cool-down  Performed on first and last piece of equipment    Resistance Training Performed  Yes    VAD Patient?  No    PAD/SET Patient?  No      Pain Assessment   Currently in Pain?  No/denies          Social History   Tobacco Use  Smoking Status Never Smoker  Smokeless Tobacco Never Used    Goals Met:  Independence with exercise equipment Exercise tolerated well No report of cardiac concerns or symptoms Strength training completed today  Goals Unmet:  Not Applicable  Comments: Pt able to follow exercise prescription today without complaint.  Will continue to monitor for progression.    Dr. Emily Filbert is Medical Director for [place] and [facility].

## 2019-06-11 ENCOUNTER — Other Ambulatory Visit: Payer: Self-pay

## 2019-06-11 ENCOUNTER — Encounter: Payer: Medicare Other | Attending: Internal Medicine | Admitting: *Deleted

## 2019-06-11 DIAGNOSIS — Z9861 Coronary angioplasty status: Secondary | ICD-10-CM | POA: Insufficient documentation

## 2019-06-11 NOTE — Progress Notes (Signed)
Daily Session Note  Patient Details  Name: Melinda Maxwell MRN: [id number] Date of Birth: 07/15/1941 Referring Provider:     Cardiac Rehab from 12/15/2018 in [facility]  Referring Provider  Frazier Richards MD      Encounter Date: 06/11/2019  Check In: Session Check In - 06/11/19 1407      Check-In   Supervising physician immediately available to respond to emergencies  See telemetry face sheet for immediately available ER MD    Location  [facility]    Staff Present  Renita Papa, RN BSN;Jessica Luan Pulling, MA, RCEP, CCRP, CCET;Joseph Holton RCP,RRT,BSRT    Virtual Visit  No    Medication changes reported      No    Fall or balance concerns reported     No    Warm-up and Cool-down  Performed on first and last piece of equipment    Resistance Training Performed  Yes    VAD Patient?  No    PAD/SET Patient?  No      Pain Assessment   Currently in Pain?  No/denies          Social History   Tobacco Use  Smoking Status Never Smoker  Smokeless Tobacco Never Used    Goals Met:  Independence with exercise equipment Exercise tolerated well No report of cardiac concerns or symptoms Strength training completed today  Goals Unmet:  Not Applicable  Comments: Pt able to follow exercise prescription today without complaint.  Will continue to monitor for progression.    Dr. Emily Filbert is Medical Director for [facility].

## 2019-06-17 ENCOUNTER — Encounter: Payer: Self-pay | Admitting: *Deleted

## 2019-06-17 DIAGNOSIS — Z9861 Coronary angioplasty status: Secondary | ICD-10-CM

## 2019-06-17 NOTE — Progress Notes (Signed)
Cardiac Individual Treatment Plan  Patient Details  Name: Melinda Maxwell MRN: [id number] Date of Birth: 11/25/1940 Referring Provider:     Cardiac Rehab from 12/15/2018 in [facility] Cardiac and Pulmonary Rehab  Referring Provider  Frazier Richards MD      Initial Encounter Date:    Cardiac Rehab from 12/15/2018 in [facility] Cardiac and Pulmonary Rehab  Date  12/15/18      Visit Diagnosis: S/P PTCA (percutaneous transluminal coronary angioplasty)  Patient's Home Medications on Admission:  Current Outpatient Medications:  .  allopurinol (ZYLOPRIM) 300 MG tablet, Take 300 mg by mouth daily., Disp: , Rfl:  .  Cholecalciferol (VITAMIN D3) 50 MCG (2000 UT) capsule, Take by mouth., Disp: , Rfl:  .  clopidogrel (PLAVIX) 75 MG tablet, Take 1 tablet (75 mg total) by mouth daily with breakfast., Disp: 90 tablet, Rfl: 4 .  colchicine 0.6 MG tablet, Take by mouth., Disp: , Rfl:  .  diltiazem (CARDIZEM CD) 120 MG 24 hr capsule, Take 1 capsule (120 mg total) by mouth daily., Disp: , Rfl:  .  furosemide (LASIX) 40 MG tablet, Take 40 mg by mouth daily., Disp: , Rfl:  .  isosorbide mononitrate (IMDUR) 30 MG 24 hr tablet, Take 30 mg by mouth daily., Disp: , Rfl: 11 .  metoprolol succinate (TOPROL XL) 25 MG 24 hr tablet, Take 1 tablet (25 mg total) by mouth daily. (Patient not taking: Reported on 12/15/2018), Disp: , Rfl:  .  Naphazoline-Pheniramine (ALLERGY EYE OP), Place 1 drop into both eyes daily as needed (for dry eyes)., Disp: , Rfl:  .  nitroGLYCERIN (NITROSTAT) 0.4 MG SL tablet, Place 0.4 mg under the tongue every 5 (five) minutes as needed for chest pain. , Disp: , Rfl: 0 .  nystatin (MYCOSTATIN/NYSTOP) powder, Apply 1 g topically daily as needed. areas or skinfolds for yeast, Disp: , Rfl: 3 .  pantoprazole (PROTONIX) 40 MG tablet, Take 40 mg by mouth daily. , Disp: , Rfl: 2 .  potassium chloride (K-DUR) 10 MEQ tablet, , Disp: , Rfl:  .  rivaroxaban (XARELTO) 20 MG TABS tablet, Take 1 tablet (20 mg total) by  mouth daily., Disp: 30 tablet, Rfl:  .  simvastatin (ZOCOR) 10 MG tablet, Take 10 mg by mouth daily. HLD- reduced d/t interaction with Diltiazem- increased r/f Rhabdomyelosis, Disp: , Rfl:  .  spironolactone (ALDACTONE) 25 MG tablet, Take 25 mg by mouth daily. , Disp: , Rfl: 2 .  white petrolatum (VASELINE) GEL, Apply 1 application topically as needed (for eyes and face)., Disp: , Rfl:   Past Medical History: Past Medical History:  Diagnosis Date  . Arthritis   . Asthma without status asthmaticus    unspecified  . Breast cancer ([place])   . Coronary artery disease   . Diverticulosis   . DVT (deep venous thrombosis) ([place])   . Edema   . Gout   . Hyperlipidemia   . Hypertension   . MI, old   . Myocardial infarct ([place]) 2004  . Sleep apnea     Tobacco Use: Social History   Tobacco Use  Smoking Status Never Smoker  Smokeless Tobacco Never Used    Labs: Recent Review Flowsheet Data    There is no flowsheet data to display.       Exercise Target Goals: Exercise Program Goal: Individual exercise prescription set using results from initial 6 min walk test and THRR while considering  patient's activity barriers and safety.   Exercise Prescription Goal: Initial exercise prescription  builds to 30-45 minutes a day of aerobic activity, 2-3 days per week.  Home exercise guidelines will be given to patient during program as part of exercise prescription that the participant will acknowledge.  Activity Barriers & Risk Stratification:   6 Minute Walk:   Oxygen Initial Assessment:   Oxygen Re-Evaluation:   Oxygen Discharge (Final Oxygen Re-Evaluation):   Initial Exercise Prescription:   Perform Capillary Blood Glucose checks as needed.  Exercise Prescription Changes: Exercise Prescription Changes    Row Name 12/24/18 0900 05/11/19 1000 05/11/19 1100 05/27/19 1300 06/02/19 1200     Response to Exercise   Blood Pressure (Admit)  118/72  118/60  -  162/80  126/70   Blood  Pressure (Exercise)  140/70  142/70  -  158/68  154/86   Blood Pressure (Exit)  132/70  124/60  -  140/80  148/78   Heart Rate (Admit)  47 bpm  69 bpm  -  72 bpm  91 bpm   Heart Rate (Exercise)  88 bpm  105 bpm  -  99 bpm  88 bpm   Heart Rate (Exit)  58 bpm  81 bpm  -  84 bpm  79 bpm   Rating of Perceived Exertion (Exercise)  11  15  -  13  13   Symptoms  fatigue  fatigue  -  none  none   Comments  first full day of exercise  barely able to keep machines on  -  -  -   Duration  Progress to 30 minutes of  aerobic without signs/symptoms of physical distress  Progress to 30 minutes of  aerobic without signs/symptoms of physical distress  -  Continue with 30 min of aerobic exercise without signs/symptoms of physical distress.  Continue with 30 min of aerobic exercise without signs/symptoms of physical distress.   Intensity  THRR unchanged  THRR unchanged  -  THRR unchanged  THRR unchanged     Progression   Progression  Continue to progress workloads to maintain intensity without signs/symptoms of physical distress.  Continue to progress workloads to maintain intensity without signs/symptoms of physical distress.  -  Continue to progress workloads to maintain intensity without signs/symptoms of physical distress.  Continue to progress workloads to maintain intensity without signs/symptoms of physical distress.   Average METs  1.1  1  -  1.5  1.5     Resistance Training   Training Prescription  Yes  Yes  -  Yes  Yes   Weight  3 lbs  2 lbs  -  2 lbs  2 lbs   Reps  10-15  10-15  -  10-15  10-15     Interval Training   Interval Training  No  No  -  No  No     NuStep   Level  1  -  -  -  1   Minutes  15  -  -  -  15   METs  1.1  -  -  -  1     Arm Ergometer   Level  -  1  -  -  -   Minutes  -  15  -  -  -   METs  -  1  -  -  -     Biostep-RELP   Level  1  1  -  2  2   Minutes  15  15  -  15  15  METs  1  1  -  2  2     Home Exercise Plan   Plans to continue exercise at  Home (comment)  walking  -  Home (comment) walking  Home (comment) walking  Home (comment) walking   Frequency  Add 2 additional days to program exercise sessions.  -  Add 2 additional days to program exercise sessions.  Add 2 additional days to program exercise sessions.  Add 2 additional days to program exercise sessions.   Initial Home Exercises Provided  12/23/18 sent in mail  -  12/23/18 sent in mail  12/23/18 sent in mail  12/23/18 sent in mail   [place] Name 06/16/19 1100             Response to Exercise   Blood Pressure (Admit)  142/82       Blood Pressure (Exercise)  142/80       Blood Pressure (Exit)  134/80       Heart Rate (Admit)  61 bpm       Heart Rate (Exercise)  90 bpm       Heart Rate (Exit)  75 bpm       Rating of Perceived Exertion (Exercise)  11       Symptoms  none       Duration  Continue with 30 min of aerobic exercise without signs/symptoms of physical distress.       Intensity  THRR unchanged         Progression   Progression  Continue to progress workloads to maintain intensity without signs/symptoms of physical distress.       Average METs  1.5         Resistance Training   Training Prescription  Yes       Weight  2 lbs       Reps  10-15         Interval Training   Interval Training  No         NuStep   Level  1       Minutes  15       METs  1.1         T5 Nustep   Level  1       Minutes  15       METs  1.8         Home Exercise Plan   Plans to continue exercise at  Home (comment) walking       Frequency  Add 2 additional days to program exercise sessions.       Initial Home Exercises Provided  12/23/18 sent in mail          Exercise Comments: Exercise Comments    Row Name 05/25/19 1828           Exercise Comments  Patient post BP elevated. Patient's weight was up 2 lbs, as well. Patient was educated by Heath Lark, RN on sodium intake and keeping track of her weight daily and to notify her doctor when she sees an increase of more than 2-3 lbs in 2  days or 5 lbs in one week. Patient and caregiver voiced understanding. Patient will be referred to our dietitian for more specific recommendations on her sodium intake and meal planning.Patient was advised to take her night meds as usual and call 911 in case of emergency.          Exercise Goals and Review:   Exercise Goals Re-Evaluation :  Exercise Goals Re-Evaluation    Row Name 12/24/18 0859 12/26/18 1333 01/01/19 1148 01/26/19 1139 02/18/19 1115     Exercise Goal Re-Evaluation   Exercise Goals Review  Increase Physical Activity;Increase Strength and Stamina;Understanding of Exercise Prescription  Increase Physical Activity;Increase Strength and Stamina;Understanding of Exercise Prescription  Increase Physical Activity;Increase Strength and Stamina;Understanding of Exercise Prescription  Increase Physical Activity;Increase Strength and Stamina;Understanding of Exercise Prescription  Increase Physical Activity;Increase Strength and Stamina;Understanding of Exercise Prescription   Comments  Regene has completed her first full day of exercise.  She does not have access to email so we have sent out her home exercise guidelines in the mail and will do a follow up phone calls.  We will continue to monitor her progress at home.   Flavia is still waiting on her packet.  She is eager to get started and has started to walk some already.  She says she will call if she has any questions.   Luellen is still waiting on her packet.  She thinks her daughter may have overlooked it and not brought it over.  She is eager to get started.  We talked about starting to walk inside and doing some warm up exercises.   Madesyn has been walking at home some.  She has continued to have some dizzy spells and was evaulated by her cardiologist.  She is now scheduled for a nuclear stress test next week to see if there have been any changes since last year.  She is also wearing a holter monitor.  She enjoys her walks, especially when able to  go with her daughter.   Lauria has been walking at home.  She would like to be able to get our videos.  She will have her daughter email [place] with contact info for her.  She did well on her stress test and they could not determine why she was getting her dizzy spells.  They believe it is a combination of serveral things and will continue with medical management. Recently, she has been feeling better.   Expected Outcomes  Short: Start to exercise by walking at home. Long: Continue to increase activity levels.   Short: Continue to walk at home.  Long: Continue to increase activity levels.   Short: Start to walk and do warm up exercises.  Long: Continue to move more  Short: Continue to walk and do well on stress test.  Long: Continue to get out to move.   Short: Continue to walk daily and get emails of our videos.  Long: Continue to increase activity levels.    [place] Name 05/04/19 1621 05/07/19 1421 05/27/19 1322 06/02/19 1249 06/08/19 1640     Exercise Goal Re-Evaluation   Exercise Goals Review  -  Increase Physical Activity;Increase Strength and Stamina;Understanding of Exercise Prescription  Increase Physical Activity;Increase Strength and Stamina;Understanding of Exercise Prescription  Increase Physical Activity;Increase Strength and Stamina;Understanding of Exercise Prescription  Increase Physical Activity;Increase Strength and Stamina;Able to understand and use rate of perceived exertion (RPE) scale;Knowledge and understanding of Target Heart Rate Range (THRR);Able to check pulse independently;Understanding of Exercise Prescription   Comments  -  Doreatha has been walking some but not much. She returned this week and felt pretty good after her first.   She is hoping that getting back to rehab will help regain strength.  We talked about using a walker versus cane and to build up to 10 min walking bouts.  Roma is doing well in rehab.  She is  getting stronger and the walker is helping her get around better.  She is now  up to level 2 on the BioStep.  We have held off on the arm crank as she squeezes too tight and struggles some with it.  Romi continues to do well in rehab.  She continues to see improvements each day that she comes. We will continue to monitor her progress.  Tangela likes the NuStep much better than Biostep.  See says she could tell she was more short of breath beacuse she didnt exercise and just read a book this weekend.  She feels she is stronger - she doesnt get as tired doing ADLs.  Her back bothers her some.We talked about doing shoulder blade squeezes to help with posture.She is also concerned about strengthening her legs.She increased to level 5 on NS for about 10 steps each leg.  It bothered her back briefly but pain went away when she went back to level 1.   Expected Outcomes  -  Short: Walk more at home and use walker.  Long: Continue to increase strength and stamina.  Short: Walk more with walker at home.  Long: Continue to increase strength  Short: Continue to attend rehab and walk at home.  Long: Continue to get stronger.  Short - continue to attend rehab and do shoulder squeezes Long - continue to build stamina   Row Name 06/16/19 1052             Exercise Goal Re-Evaluation   Exercise Goals Review  Increase Physical Activity;Increase Strength and Stamina;Understanding of Exercise Prescription       Comments  Elle is doing well in rehab.  We challenged her to try to do a higher level for 10 steps with each leg each day to help build up strength.  We will continue to monitor her progress.       Expected Outcomes  Short: Add in her 10 steps at a higher level consistently.  Long: Continue to build strength in legs.          Discharge Exercise Prescription (Final Exercise Prescription Changes): Exercise Prescription Changes - 06/16/19 1100      Response to Exercise   Blood Pressure (Admit)  142/82    Blood Pressure (Exercise)  142/80    Blood Pressure (Exit)  134/80    Heart Rate  (Admit)  61 bpm    Heart Rate (Exercise)  90 bpm    Heart Rate (Exit)  75 bpm    Rating of Perceived Exertion (Exercise)  11    Symptoms  none    Duration  Continue with 30 min of aerobic exercise without signs/symptoms of physical distress.    Intensity  THRR unchanged      Progression   Progression  Continue to progress workloads to maintain intensity without signs/symptoms of physical distress.    Average METs  1.5      Resistance Training   Training Prescription  Yes    Weight  2 lbs    Reps  10-15      Interval Training   Interval Training  No      NuStep   Level  1    Minutes  15    METs  1.1      T5 Nustep   Level  1    Minutes  15    METs  1.8      Home Exercise Plan   Plans to continue exercise at  Home (comment)  walking   Frequency  Add 2 additional days to program exercise sessions.    Initial Home Exercises Provided  12/23/18   sent in mail      Nutrition:  Target Goals: Understanding of nutrition guidelines, daily intake of sodium <1560m, cholesterol <208m calories 30% from fat and 7% or less from saturated fats, daily to have 5 or more servings of fruits and vegetables.  Biometrics:    Nutrition Therapy Plan and Nutrition Goals: Nutrition Therapy & Goals - 02/03/19 1328      Nutrition Therapy   Diet  low Na, Heart healthy diet    Protein (specify units)  ~60g    Fiber  25 grams    Whole Grain Foods  3 servings    Saturated Fats  12 max. grams    Fruits and Vegetables  5 servings/day    Sodium  1.5 grams      Personal Nutrition Goals   Nutrition Goal  ST: pt wants to start eating 1 serving of greens /day at least 5x/week ST: eat vitamin c rich foods with meals ST: increase fiber for better bowel health and regularity (prunes and other F/V) ST: limit black tea consumption around mealtime LT:  to be able to walk without shuffling feet with cane LT: be more empowered with healthy eating (her normal) LT: No longer be anemic    Comments  Pt  would benefit from in-person/ visual education. Had RD in past showed portions and wants to know what to eat. She is currently anemic and is on iron pills; stomach pains. Raisin bran in the mornning. Yogurt at night. chicken and rice (pts favortie) with beans and tossed salad with squash casserole with cheese for her birthday. pt reports eating a lot of cheese and will eat a cheese sandwich when all else fails for lunch.  will go to farm to get cabbage, potatoes. Pt reports that is aware what to eat, but isn't really doing it. Discussed MyPlate and general healthy eating. Discussed the importance of fiber and how to increase iron absorption.       Intervention Plan   Intervention  Prescribe, educate and counsel regarding individualized specific dietary modifications aiming towards targeted core components such as weight, hypertension, lipid management, diabetes, heart failure and other comorbidities.    Expected Outcomes  Short Term Goal: Understand basic principles of dietary content, such as calories, fat, sodium, cholesterol and nutrients.;Short Term Goal: A plan has been developed with personal nutrition goals set during dietitian appointment.;Long Term Goal: Adherence to prescribed nutrition plan.       Nutrition Assessments:   Nutrition Goals Re-Evaluation: Nutrition Goals Re-Evaluation    RoSwartzame 03/18/19 1332 05/04/19 1737 06/04/19 1420         Goals   Nutrition Goal  ST: pt wants to start eating 1 serving of greens /day at least 5x/week LT: be more empowered with healthy eating (her normal) LT: No longer be anemic  ST: pt wants to start eating 1 serving of greens /day at least 5x/week LT: be more empowered with healthy eating (her normal) LT: No longer be anemic  STLK:[id number] LT: be more empowered with healthy eating (her normal) LT: No longer be anemic     Comment  Been putting off the greens because was nervous about her blood, but spoke with the dr and got ok from him so will now try  to implement that. She says she knows what she needs to do, but hasn't done anything  yet.  Pt wants some nutrition content, has not gotten her daughters email, told her will call back to get that from her so that we can send her over some content.   She will eat turnip greens when she goes out but [employer] much at home. Gave pt HH eating guidelines to review as we discussed during virtual rehab. Pt 1st day back, will touch base to talk more.  pt wants to reduce sodium to reduce swelling, doesn't cook with any salt and has been trying to remove snacks; discussed some low Na healthy snacks pt liked the idea of fruit and nuts. Gave Na education, pt receptive.     Expected Outcome  Pt no longer anemic, pt empowered with healthy eating  Pt no longer anemic, pt empowered with healthy eating  Pt will reduce BP and swelling        Nutrition Goals Discharge (Final Nutrition Goals Re-Evaluation): Nutrition Goals Re-Evaluation - 06/04/19 1420      Goals   Nutrition Goal  WN:IOEVOJ Na LT: be more empowered with healthy eating (her normal) LT: No longer be anemic    Comment  pt wants to reduce sodium to reduce swelling, doesn't cook with any salt and has been trying to remove snacks; discussed some low Na healthy snacks pt liked the idea of fruit and nuts. Gave Na education, pt receptive.    Expected Outcome  Pt will reduce BP and swelling       Psychosocial: Target Goals: Acknowledge presence or absence of significant depression and/or stress, maximize coping skills, provide positive support system. Participant is able to verbalize types and ability to use techniques and skills needed for reducing stress and depression.   Initial Review & Psychosocial Screening:   Quality of Life Scores:   Scores of 19 and below usually indicate a poorer quality of life in these areas.  A difference of  2-3 points is a clinically meaningful difference.  A difference of 2-3 points in the total score of the Quality of Life  Index has been associated with significant improvement in overall quality of life, self-image, physical symptoms, and general health in studies assessing change in quality of life.  PHQ-9: Recent Review Flowsheet Data    Depression screen [facility] 2/9 12/15/2018   Decreased Interest 0   Down, Depressed, Hopeless 0   PHQ - 2 Score 0   Altered sleeping 2   Tired, decreased energy 3   Change in appetite 0   Feeling bad or failure about yourself  0   Trouble concentrating 1   Moving slowly or fidgety/restless 2   Suicidal thoughts 0   PHQ-9 Score 8   Difficult doing work/chores Somewhat difficult     Interpretation of Total Score  Total Score Depression Severity:  1-4 = Minimal depression, 5-9 = Mild depression, 10-14 = Moderate depression, 15-19 = Moderately severe depression, 20-27 = Severe depression   Psychosocial Evaluation and Intervention:   Psychosocial Re-Evaluation: Psychosocial Re-Evaluation    Row Name 01/01/19 1155 01/26/19 1140 02/18/19 1118 05/07/19 1427 06/08/19 1648     Psychosocial Re-Evaluation   Current issues with  Current Stress Concerns;Current Sleep Concerns  Current Stress Concerns;Current Sleep Concerns  Current Stress Concerns;Current Sleep Concerns  Current Stress Concerns  Current Stress Concerns   Comments  Patsey is doing well at home. She really wants to get started on her home exercise but has not gotten her packet yet.  She thinks her daughter may have it but doesn't  know it.  She has switched her lasix to daytime and is now sleeping better.  She still has to get up but not as frequently.  She has noted that sometimes she forget to take it and when she does, her weight goes up.  Overall, she is staying postive.   Eniola is doing well at home mentally.  She has had some dizzy spells and is working with her cardiologist on this.  She is wearing a holter monitor and has a nuclear stress test coming up next week.  This week she is celebrating her 78th birthday!!  She is  excited to reach this milestones as her parents both died in their 60s.  Her family sent her cake and dinner this past weekend to celebrate her!  Khrystina continues do well at home.  Her dizzy spells are getting better.  She still talks with her family regularly.  She is looking forward to getting back to class!  Nashya is doing well.  She is excited to be back in class to regain her strength.  She is sleeping well.  Marthe kept her daughters cat for her and she was a little worried due to the different routine.  Otherwise, she has very little stress.   Expected Outcomes  Short: Remeber to take meds daily and get packet from daughter.  Long: Continue to stay postive.   Short: Enjoy her birthday.  Long: Continue to stay positive.   Short: Continue to feel better. Long: Continue to stay positive.   Short: Continue to feel better. Long: Continue to stay positive.   Short - continue exercise Long - maintain positive attitude   Interventions  Encouraged to attend Cardiac Rehabilitation for the exercise  Encouraged to attend Cardiac Rehabilitation for the exercise  -  Encouraged to attend Cardiac Rehabilitation for the exercise  Encouraged to attend Cardiac Rehabilitation for the exercise   Continue Psychosocial Services   Follow up required by staff  Follow up required by staff  -  -  -   Comments  Damara relies on family and friend/care giver to take her places. Sometimes she feels bad asking them for help. She does have a good support system and people constantly checking up on her.   -  -  -  -     Initial Review   Source of Stress Concerns  Transportation;Unable to participate in former interests or hobbies  -  -  -  -      Psychosocial Discharge (Final Psychosocial Re-Evaluation): Psychosocial Re-Evaluation - 06/08/19 1648      Psychosocial Re-Evaluation   Current issues with  Current Stress Concerns    Comments  Lavren kept her daughters cat for her and she was a little worried due to the different routine.   Otherwise, she has very little stress.    Expected Outcomes  Short - continue exercise Long - maintain positive attitude    Interventions  Encouraged to attend Cardiac Rehabilitation for the exercise       Vocational Rehabilitation: Provide vocational rehab assistance to qualifying candidates.   Vocational Rehab Evaluation & Intervention:   Education: Education Goals: Education classes will be provided on a variety of topics geared toward better understanding of heart health and risk factor modification. Participant will state understanding/return demonstration of topics presented as noted by education test scores.  Learning Barriers/Preferences:   Education Topics:  AED/CPR: - Group verbal and written instruction with the use of models to demonstrate the basic use of  the AED with the basic ABC's of resuscitation.   General Nutrition Guidelines/Fats and Fiber: -Group instruction provided by verbal, written material, models and posters to present the general guidelines for heart healthy nutrition. Gives an explanation and review of dietary fats and fiber.   Controlling Sodium/Reading Food Labels: -Group verbal and written material supporting the discussion of sodium use in heart healthy nutrition. Review and explanation with models, verbal and written materials for utilization of the food label.   Cardiac Rehab from 12/18/2018 in [facility] Cardiac and Pulmonary Rehab  Date  12/18/18  Educator  [facility]  Instruction Review Code  1- Verbalizes Understanding      Exercise Physiology & General Exercise Guidelines: - Group verbal and written instruction with models to review the exercise physiology of the cardiovascular system and associated critical values. Provides general exercise guidelines with specific guidelines to those with heart or lung disease.    Aerobic Exercise & Resistance Training: - Gives group verbal and written instruction on the various components of exercise. Focuses on  aerobic and resistive training programs and the benefits of this training and how to safely progress through these programs..   Flexibility, Balance, Mind/Body Relaxation: Provides group verbal/written instruction on the benefits of flexibility and balance training, including mind/body exercise modes such as yoga, pilates and tai chi.  Demonstration and skill practice provided.   Stress and Anxiety: - Provides group verbal and written instruction about the health risks of elevated stress and causes of high stress.  Discuss the correlation between heart/lung disease and anxiety and treatment options. Review healthy ways to manage with stress and anxiety.   Depression: - Provides group verbal and written instruction on the correlation between heart/lung disease and depressed mood, treatment options, and the stigmas associated with seeking treatment.   Anatomy & Physiology of the Heart: - Group verbal and written instruction and models provide basic cardiac anatomy and physiology, with the coronary electrical and arterial systems. Review of Valvular disease and Heart Failure   Cardiac Procedures: - Group verbal and written instruction to review commonly prescribed medications for heart disease. Reviews the medication, class of the drug, and side effects. Includes the steps to properly store meds and maintain the prescription regimen. (beta blockers and nitrates)   Cardiac Medications I: - Group verbal and written instruction to review commonly prescribed medications for heart disease. Reviews the medication, class of the drug, and side effects. Includes the steps to properly store meds and maintain the prescription regimen.   Cardiac Medications II: -Group verbal and written instruction to review commonly prescribed medications for heart disease. Reviews the medication, class of the drug, and side effects. (all other drug classes)    Go Sex-Intimacy & Heart Disease, Get SMART - Goal  Setting: - Group verbal and written instruction through game format to discuss heart disease and the return to sexual intimacy. Provides group verbal and written material to discuss and apply goal setting through the application of the S.M.A.R.T. Method.   Other Matters of the Heart: - Provides group verbal, written materials and models to describe Stable Angina and Peripheral Artery. Includes description of the disease process and treatment options available to the cardiac patient.   Exercise & Equipment Safety: - Individual verbal instruction and demonstration of equipment use and safety with use of the equipment.   Cardiac Rehab from 12/18/2018 in [facility] Cardiac and Pulmonary Rehab  Date  12/15/18  Educator  [facility]  Instruction Review Code  1- Verbalizes Understanding  Infection Prevention: - Provides verbal and written material to individual with discussion of infection control including proper hand washing and proper equipment cleaning during exercise session.   Cardiac Rehab from 12/18/2018 in [facility] Cardiac and Pulmonary Rehab  Date  12/15/18  Educator  [facility]  Instruction Review Code  1- Verbalizes Understanding      Falls Prevention: - Provides verbal and written material to individual with discussion of falls prevention and safety.   Cardiac Rehab from 12/18/2018 in [facility] Cardiac and Pulmonary Rehab  Date  12/15/18  Educator  [facility]  Instruction Review Code  1- Verbalizes Understanding      Diabetes: - Individual verbal and written instruction to review signs/symptoms of diabetes, desired ranges of glucose level fasting, after meals and with exercise. Acknowledge that pre and post exercise glucose checks will be done for 3 sessions at entry of program.   Know Your Numbers and Risk Factors: -Group verbal and written instruction about important numbers in your health.  Discussion of what are risk factors and how they play a role in the disease process.  Review of Cholesterol, Blood  Pressure, Diabetes, and BMI and the role they play in your overall health.   Sleep Hygiene: -Provides group verbal and written instruction about how sleep can affect your health.  Define sleep hygiene, discuss sleep cycles and impact of sleep habits. Review good sleep hygiene tips.    Other: -Provides group and verbal instruction on various topics (see comments)   Knowledge Questionnaire Score:   Core Components/Risk Factors/Patient Goals at Admission:   Core Components/Risk Factors/Patient Goals Review:  Goals and Risk Factor Review    Row Name 12/26/18 1335 01/01/19 1203 01/26/19 1142 02/18/19 1119 05/07/19 1428     Core Components/Risk Factors/Patient Goals Review   Personal Goals Review  Weight Management/Obesity;Hypertension;Lipids  Weight Management/Obesity;Hypertension;Lipids  Weight Management/Obesity;Hypertension;Lipids  Weight Management/Obesity;Hypertension;Lipids  Weight Management/Obesity;Hypertension;Lipids   Review  Mabelle has not been watching her weight at home, but will start to do so at least every other day.  She has not been taking her blood pressure despite having a new cuff from her daughter.  She will have her daughter show her how to use the cuff so that she can start to monitor her pressures at home.  Currently, she feels good and thinks that she will be able to exercise at home.   Evett has not been able to weigh her self as she does not have a scale at home.  We will see if we can get help from the foundation for this.  She forgot to have her daugter check her pressure last time but will get out the cuff to remind her to do it.  She is eager to get started on her home exercise.    Lynelle is doing well at home.  She is still not able to weight as she doesn't have a scale.  Her sister brought her a blood pressure cuff, but she is not sure how to use it.  We will continue to check in with her as she is eager to get back to classes.   Shandon is feeling better.  Seh still is  not checking her weight or blood pressure at home since she does not have the equipment.  She had good numbers when she went to the doctor.   Jolly's weight has gone up some but overall doing well.  Her pressures have been good in class.  She will be able to keep a better eye  on these now that she is back in class.   Expected Outcomes  Short: Start checking weight and blood pressures at home.  Long: Continue to monitor risk factors.   Short: Start checking blood pressues.  Long: Continue to monitor risk factors.   Short: Continue to watch that she doesn't feel like she gains too much weight.  Long: Continue to monitor symptoms of risk factors.   Short: Continue to exercise to maintain weight and monitor symptoms.  Long: Continue to monitor risk factors at appointments.   -   [place] Name 06/08/19 1644             Core Components/Risk Factors/Patient Goals Review   Personal Goals Review  Weight Management/Obesity;Hypertension;Lipids       Review  [name] weight was up today but she ate restaurant food yesterday.  [name] BP has been good most days at rehab.  She has had 1 or 2 days with higher measurements.       Expected Outcomes  Short - continue to be aware of sodium Long - develop heart healthy habits overall          Core Components/Risk Factors/Patient Goals at Discharge (Final Review):  Goals and Risk Factor Review - 06/08/19 1644      Core Components/Risk Factors/Patient Goals Review   Personal Goals Review  Weight Management/Obesity;Hypertension;Lipids    Review  [name] weight was up today but she ate restaurant food yesterday.  [name] BP has been good most days at rehab.  She has had 1 or 2 days with higher measurements.    Expected Outcomes  Short - continue to be aware of sodium Long - develop heart healthy habits overall       ITP Comments: ITP Comments    Row Name 12/26/18 1330 12/31/18 1345 04/22/19 1341 05/04/19 1621 05/20/19 0613   ITP Comments  Our program is currently closed due to  COVID-19.  We are communicating with patient via phone calls and emails.   30 day review completed. Continue with ITP unless directed changes by Medical Director at review  30 day review cycle restarting  after being closed since March 16 because of  Covid 19 pandemic. Program opened to patients on July 6. Not all have returned. ITP updated and sent to Medical Director for review,changes as needed and signature  -  30 Day Review Completed today. Continue with ITP unless changed by Medical Director review.   [place] Name 06/17/19 0623           ITP Comments  30 Day review. Continue with ITP unless directed changes per Medical Director review.          Comments:

## 2019-06-18 ENCOUNTER — Encounter: Payer: Medicare Other | Admitting: *Deleted

## 2019-06-18 ENCOUNTER — Other Ambulatory Visit: Payer: Self-pay

## 2019-06-18 DIAGNOSIS — Z9861 Coronary angioplasty status: Secondary | ICD-10-CM | POA: Diagnosis not present

## 2019-06-18 NOTE — Progress Notes (Signed)
Daily Session Note  Patient Details  Name: SHENISE WOLGAMOTT MRN: [id number] Date of Birth: Mar 03, 1941 Referring Provider:     Cardiac Rehab from 12/15/2018 in [facility] Cardiac and Pulmonary Rehab  Referring Provider  Frazier Richards MD      Encounter Date: 06/18/2019  Check In: Session Check In - 06/18/19 1407      Check-In   Supervising physician immediately available to respond to emergencies  See telemetry face sheet for immediately available ER MD    Location  [facility]-Cardiac & Pulmonary Rehab    Staff Present  Renita Papa, RN BSN;Jessica Luan Pulling, MA, RCEP, CCRP, CCET;Joseph [place] RCP,RRT,BSRT    Virtual Visit  No    Medication changes reported      No    Fall or balance concerns reported     No    Warm-up and Cool-down  Performed on first and last piece of equipment    Resistance Training Performed  Yes    VAD Patient?  No    PAD/SET Patient?  No      Pain Assessment   Currently in Pain?  No/denies          Social History   Tobacco Use  Smoking Status Never Smoker  Smokeless Tobacco Never Used    Goals Met:  Independence with exercise equipment Exercise tolerated well No report of cardiac concerns or symptoms Strength training completed today  Goals Unmet:  Not Applicable  Comments: Pt able to follow exercise prescription today without complaint.  Will continue to monitor for progression.    Dr. Emily Filbert is Medical Director for [place] and [facility].

## 2019-06-22 ENCOUNTER — Other Ambulatory Visit: Payer: Self-pay

## 2019-06-22 ENCOUNTER — Encounter: Payer: Medicare Other | Admitting: *Deleted

## 2019-06-22 DIAGNOSIS — Z9861 Coronary angioplasty status: Secondary | ICD-10-CM

## 2019-06-22 NOTE — Progress Notes (Signed)
Daily Session Note  Patient Details  Name: MAHEK SCHLESINGER MRN: [id number] Date of Birth: 1941/08/24 Referring Provider:     Cardiac Rehab from 12/15/2018 in [facility] Cardiac and Pulmonary Rehab  Referring Provider  Frazier Richards MD      Encounter Date: 06/22/2019  Check In: Session Check In - 06/22/19 1618      Check-In   Supervising physician immediately available to respond to emergencies  See telemetry face sheet for immediately available ER MD    Location  [facility]-Cardiac & Pulmonary Rehab    Staff Present  Renita Papa, RN BSN;Jessica [place], MA, RCEP, CCRP, [place], BS, ACSM CEP, Exercise Physiologist    Virtual Visit  No    Medication changes reported      No    Fall or balance concerns reported     No    Warm-up and Cool-down  Performed on first and last piece of equipment    Resistance Training Performed  Yes    VAD Patient?  No    PAD/SET Patient?  No      Pain Assessment   Currently in Pain?  No/denies          Social History   Tobacco Use  Smoking Status Never Smoker  Smokeless Tobacco Never Used    Goals Met:  Independence with exercise equipment Exercise tolerated well No report of cardiac concerns or symptoms Strength training completed today  Goals Unmet:  Not Applicable  Comments: Pt able to follow exercise prescription today without complaint.  Will continue to monitor for progression.    Dr. Emily Filbert is Medical Director for [place] and [facility].

## 2019-06-25 ENCOUNTER — Other Ambulatory Visit: Payer: Self-pay

## 2019-06-25 ENCOUNTER — Encounter: Payer: Medicare Other | Admitting: *Deleted

## 2019-06-25 DIAGNOSIS — Z9861 Coronary angioplasty status: Secondary | ICD-10-CM

## 2019-06-25 NOTE — Progress Notes (Signed)
Daily Session Note  Patient Details  Name: Melinda Maxwell MRN: [id number] Date of Birth: Mar 10, 1941 Referring Provider:     Cardiac Rehab from 12/15/2018 in [facility] Cardiac and Pulmonary Rehab  Referring Provider  Frazier Richards MD      Encounter Date: 06/25/2019  Check In: Session Check In - 06/25/19 1402      Check-In   Supervising physician immediately available to respond to emergencies  See telemetry face sheet for immediately available ER MD    Location  [facility]-Cardiac & Pulmonary Rehab    Staff Present  Renita Papa, RN BSN;Jessica Luan [place]ing, MA, RCEP, CCRP, CCET    Virtual Visit  No    Medication changes reported      No    Fall or balance concerns reported     No    Warm-up and Cool-down  Performed on first and last piece of equipment    Resistance Training Performed  Yes    VAD Patient?  No    PAD/SET Patient?  No      Pain Assessment   Currently in Pain?  No/denies          Social History   Tobacco Use  Smoking Status Never Smoker  Smokeless Tobacco Never Used    Goals Met:  Independence with exercise equipment Exercise tolerated well No report of cardiac concerns or symptoms Strength training completed today  Goals Unmet:  Not Applicable  Comments: Pt able to follow exercise prescription today without complaint.  Will continue to monitor for progression.    Dr. Emily Filbert is Medical Director for [place] and [facility].

## 2019-06-29 ENCOUNTER — Encounter: Payer: Medicare Other | Admitting: *Deleted

## 2019-06-29 ENCOUNTER — Other Ambulatory Visit: Payer: Self-pay

## 2019-06-29 DIAGNOSIS — Z9861 Coronary angioplasty status: Secondary | ICD-10-CM

## 2019-06-29 NOTE — Progress Notes (Signed)
Daily Session Note  Patient Details  Name: Melinda Maxwell MRN: [id number] Date of Birth: 1941/04/17 Referring Provider:     Cardiac Rehab from 12/15/2018 in [facility] Cardiac and Pulmonary Rehab  Referring Provider  Frazier Richards MD      Encounter Date: 06/29/2019  Check In: Session Check In - 06/29/19 1619      Check-In   Supervising physician immediately available to respond to emergencies  See telemetry face sheet for immediately available ER MD    Location  [facility]-Cardiac & Pulmonary Rehab    Staff Present  Renita Papa, RN [place], BS, ACSM CEP, Exercise Physiologist;Joseph Tessie Fass RCP,RRT,BSRT    Virtual Visit  No    Medication changes reported      No    Fall or balance concerns reported     Yes    Comments  fell on Thursday, blacked out and does not remember what happened. Postponed 6 min walk til next time, seated machines today.    Warm-up and Cool-down  Performed on first and last piece of equipment    Resistance Training Performed  Yes    VAD Patient?  No    PAD/SET Patient?  No      Pain Assessment   Currently in Pain?  No/denies          Social History   Tobacco Use  Smoking Status Never Smoker  Smokeless Tobacco Never Used    Goals Met:  Independence with exercise equipment Exercise tolerated well No report of cardiac concerns or symptoms Strength training completed today  Goals Unmet:  Not Applicable  Comments: Pt able to follow exercise prescription today without complaint.  Will continue to monitor for progression.    Dr. Emily Filbert is Medical Director for [place] and [facility].

## 2019-07-02 ENCOUNTER — Encounter: Payer: Medicare Other | Admitting: *Deleted

## 2019-07-02 ENCOUNTER — Other Ambulatory Visit: Payer: Self-pay

## 2019-07-02 DIAGNOSIS — Z9861 Coronary angioplasty status: Secondary | ICD-10-CM

## 2019-07-02 NOTE — Progress Notes (Signed)
Discharge Progress Report  Patient Details  Name: Melinda Maxwell MRN: [id number] Date of Birth: April 04, 1941 Referring Provider:     Cardiac Rehab from 12/15/2018 in [facility]  Referring Provider  Frazier Richards MD       Number of Visits: 20  Reason for Discharge:  Patient has met program and personal goals.  Smoking History:  Social History   Tobacco Use  Smoking Status Never Smoker  Smokeless Tobacco Never Used    Diagnosis:  S/P PTCA (percutaneous transluminal coronary angioplasty)  ADL UCSD:   Initial Exercise Prescription:   Discharge Exercise Prescription (Final Exercise Prescription Changes): Exercise Prescription Changes - 06/16/19 1100      Response to Exercise   Blood Pressure (Admit)  142/82    Blood Pressure (Exercise)  142/80    Blood Pressure (Exit)  134/80    Heart Rate (Admit)  61 bpm    Heart Rate (Exercise)  90 bpm    Heart Rate (Exit)  75 bpm    Rating of Perceived Exertion (Exercise)  11    Symptoms  none    Duration  Continue with 30 min of aerobic exercise without signs/symptoms of physical distress.    Intensity  THRR unchanged      Progression   Progression  Continue to progress workloads to maintain intensity without signs/symptoms of physical distress.    Average METs  1.5      Resistance Training   Training Prescription  Yes    Weight  2 lbs    Reps  10-15      Interval Training   Interval Training  No      NuStep   Level  1    Minutes  15    METs  1.1      T5 Nustep   Level  1    Minutes  15    METs  1.8      Home Exercise Plan   Plans to continue exercise at  Home (comment)   walking   Frequency  Add 2 additional days to program exercise sessions.    Initial Home Exercises Provided  12/23/18   sent in mail      Functional Capacity: [place] Name 07/02/19 1436         6 Minute Walk   Phase  Discharge     Distance  600 feet     Distance % Change  26.3 %     Distance  Feet Change  125 ft     Walk Time  6 minutes     # of Rest Breaks  0     MPH  1.14     METS  0.95     RPE  13     VO2 Peak  3.33     Symptoms  Yes (comment)     Resting HR  95 bpm     Resting BP  124/72     Max Ex. HR  90 bpm     Max Ex. BP  150/74        Psychological, QOL, Others - Outcomes: PHQ 2/9: Depression screen [facility] 2/9 07/02/2019 12/15/2018  Decreased Interest 1 0  Down, Depressed, Hopeless 0 0  PHQ - 2 Score 1 0  Altered sleeping 1 2  Tired, decreased energy 1 3  Change in appetite 0 0  Feeling bad or failure about yourself  1 0  Trouble concentrating 0 1  Moving slowly or fidgety/restless 2 2  Suicidal thoughts 0 0  PHQ-9 Score 6 8  Difficult doing work/chores Not difficult at all Somewhat difficult    Quality of Life:   Personal Goals: Goals established at orientation with interventions provided to work toward goal.    Personal Goals Discharge: Goals and Risk Factor Review    Row Name 01/26/19 1142 02/18/19 1119 05/07/19 1428 06/08/19 1644       Core Components/Risk Factors/Patient Goals Review   Personal Goals Review  Weight Management/Obesity;Hypertension;Lipids  Weight Management/Obesity;Hypertension;Lipids  Weight Management/Obesity;Hypertension;Lipids  Weight Management/Obesity;Hypertension;Lipids    Review  Melinda Maxwell is doing well at home.  She is still not able to weight as she doesn't have a scale.  Her sister brought her a blood pressure cuff, but she is not sure how to use it.  We will continue to check in with her as she is eager to get back to classes.   Melinda Maxwell is feeling better.  Seh still is not checking her weight or blood pressure at home since she does not have the equipment.  She had good numbers when she went to the doctor.   Melinda Maxwell's weight has gone up some but overall doing well.  Her pressures have been good in class.  She will be able to keep a better eye on these now that she is back in class.  Melinda Maxwell weight was up today but she ate restaurant food  yesterday.  Melinda Maxwell BP has been good most days at rehab.  She has had 1 or 2 days with higher measurements.    Expected Outcomes  Short: Continue to watch that she doesn't feel like she gains too much weight.  Long: Continue to monitor symptoms of risk factors.   Short: Continue to exercise to maintain weight and monitor symptoms.  Long: Continue to monitor risk factors at appointments.   -  Short - continue to be aware of sodium Long - develop heart healthy habits overall       Exercise Goals and Review:   Exercise Goals Re-Evaluation: Exercise Goals Re-Evaluation    Row Name 01/26/19 1139 02/18/19 1115 05/04/19 1621 05/07/19 1421 05/27/19 1322     Exercise Goal Re-Evaluation   Exercise Goals Review  Increase Physical Activity;Increase Strength and Stamina;Understanding of Exercise Prescription  Increase Physical Activity;Increase Strength and Stamina;Understanding of Exercise Prescription  -  Increase Physical Activity;Increase Strength and Stamina;Understanding of Exercise Prescription  Increase Physical Activity;Increase Strength and Stamina;Understanding of Exercise Prescription   Comments  Melinda Maxwell has been walking at home some.  She has continued to have some dizzy spells and was evaulated by her cardiologist.  She is now scheduled for a nuclear stress test next week to see if there have been any changes since last year.  She is also wearing a holter monitor.  She enjoys her walks, especially when able to go with her daughter.   Melinda Maxwell has been walking at home.  She would like to be able to get our videos.  She will have her daughter email [place] with contact info for her.  She did well on her stress test and they could not determine why she was getting her dizzy spells.  They believe it is a combination of serveral things and will continue with medical management. Recently, she has been feeling better.  -  Melinda Maxwell has been walking some but not much. She returned this week and felt pretty good after her first.    She is hoping that getting  back to rehab will help regain strength.  We talked about using a walker versus cane and to build up to 10 min walking bouts.  Melinda Maxwell is doing well in rehab.  She is getting stronger and the walker is helping her get around better.  She is now up to level 2 on the BioStep.  We have held off on the arm crank as she squeezes too tight and struggles some with it.   Expected Outcomes  Short: Continue to walk and do well on stress test.  Long: Continue to get out to move.   Short: Continue to walk daily and get emails of our videos.  Long: Continue to increase activity levels.   -  Short: Walk more at home and use walker.  Long: Continue to increase strength and stamina.  Short: Walk more with walker at home.  Long: Continue to increase strength   Row Name 06/02/19 1249 06/08/19 1640 06/16/19 1052         Exercise Goal Re-Evaluation   Exercise Goals Review  Increase Physical Activity;Increase Strength and Stamina;Understanding of Exercise Prescription  Increase Physical Activity;Increase Strength and Stamina;Able to understand and use rate of perceived exertion (RPE) scale;Knowledge and understanding of Target Heart Rate Range (THRR);Able to check pulse independently;Understanding of Exercise Prescription  Increase Physical Activity;Increase Strength and Stamina;Understanding of Exercise Prescription     Comments  Melinda Maxwell continues to do well in rehab.  She continues to see improvements each day that she comes. We will continue to monitor her progress.  Melinda Maxwell likes the NuStep much better than Biostep.  Melinda Maxwell says she could tell she was more short of breath beacuse she didnt exercise and just read a book this weekend.  She feels she is stronger - she doesnt get as tired doing ADLs.  Her back bothers her some.We talked about doing shoulder blade squeezes to help with posture.She is also concerned about strengthening her legs.She increased to level 5 on NS for about 10 steps each leg.  It  bothered her back briefly but pain went away when she went back to level 1.  Melinda Maxwell is doing well in rehab.  We challenged her to try to do a higher level for 10 steps with each leg each day to help build up strength.  We will continue to monitor her progress.     Expected Outcomes  Short: Continue to attend rehab and walk at home.  Long: Continue to get stronger.  Short - continue to attend rehab and do shoulder squeezes Long - continue to build stamina  Short: Add in her 10 steps at a higher level consistently.  Long: Continue to build strength in legs.        Nutrition & Weight - Outcomes:    Nutrition: Nutrition Therapy & Goals - 02/03/19 1328      Nutrition Therapy   Diet  low Na, Heart healthy diet    Protein (specify units)  ~60g    Fiber  25 grams    Whole Grain Foods  3 servings    Saturated Fats  12 max. grams    Fruits and Vegetables  5 servings/day    Sodium  1.5 grams      Personal Nutrition Goals   Nutrition Goal  ST: pt wants to start eating 1 serving of greens /day at least 5x/week ST: eat vitamin c rich foods with meals ST: increase fiber for better bowel health and regularity (prunes and other F/V) ST: limit black tea consumption  around mealtime LT:  to be able to walk without shuffling feet with cane LT: be more empowered with healthy eating (her normal) LT: No longer be anemic    Comments  Pt would benefit from in-person/ visual education. Had RD in past showed portions and wants to know what to eat. She is currently anemic and is on iron pills; stomach pains. Raisin bran in the mornning. Yogurt at night. chicken and rice (pts favortie) with beans and tossed salad with squash casserole with cheese for her birthday. pt reports eating a lot of cheese and will eat a cheese sandwich when all else fails for lunch.  will go to farm to get cabbage, potatoes. Pt reports that is aware what to eat, but isn't really doing it. Discussed MyPlate and general healthy eating. Discussed the  importance of fiber and how to increase iron absorption.       Intervention Plan   Intervention  Prescribe, educate and counsel regarding individualized specific dietary modifications aiming towards targeted core components such as weight, hypertension, lipid management, diabetes, heart failure and other comorbidities.    Expected Outcomes  Short Term Goal: Understand basic principles of dietary content, such as calories, fat, sodium, cholesterol and nutrients.;Short Term Goal: A plan has been developed with personal nutrition goals set during dietitian appointment.;Long Term Goal: Adherence to prescribed nutrition plan.       Nutrition Discharge:   Education Questionnaire Score:   Goals reviewed with patient; copy given to patient.

## 2019-07-02 NOTE — Progress Notes (Signed)
Cardiac Individual Treatment Plan  Patient Details  Name: Melinda Maxwell MRN: [id number] Date of Birth: 11/25/1940 Referring Provider:     Cardiac Rehab from 12/15/2018 in [facility] Cardiac and Pulmonary Rehab  Referring Provider  Frazier Richards MD      Initial Encounter Date:    Cardiac Rehab from 12/15/2018 in [facility] Cardiac and Pulmonary Rehab  Date  12/15/18      Visit Diagnosis: S/P PTCA (percutaneous transluminal coronary angioplasty)  Patient's Home Medications on Admission:  Current Outpatient Medications:  .  allopurinol (ZYLOPRIM) 300 MG tablet, Take 300 mg by mouth daily., Disp: , Rfl:  .  Cholecalciferol (VITAMIN D3) 50 MCG (2000 UT) capsule, Take by mouth., Disp: , Rfl:  .  clopidogrel (PLAVIX) 75 MG tablet, Take 1 tablet (75 mg total) by mouth daily with breakfast., Disp: 90 tablet, Rfl: 4 .  colchicine 0.6 MG tablet, Take by mouth., Disp: , Rfl:  .  diltiazem (CARDIZEM CD) 120 MG 24 hr capsule, Take 1 capsule (120 mg total) by mouth daily., Disp: , Rfl:  .  furosemide (LASIX) 40 MG tablet, Take 40 mg by mouth daily., Disp: , Rfl:  .  isosorbide mononitrate (IMDUR) 30 MG 24 hr tablet, Take 30 mg by mouth daily., Disp: , Rfl: 11 .  metoprolol succinate (TOPROL XL) 25 MG 24 hr tablet, Take 1 tablet (25 mg total) by mouth daily. (Patient not taking: Reported on 12/15/2018), Disp: , Rfl:  .  Naphazoline-Pheniramine (ALLERGY EYE OP), Place 1 drop into both eyes daily as needed (for dry eyes)., Disp: , Rfl:  .  nitroGLYCERIN (NITROSTAT) 0.4 MG SL tablet, Place 0.4 mg under the tongue every 5 (five) minutes as needed for chest pain. , Disp: , Rfl: 0 .  nystatin (MYCOSTATIN/NYSTOP) powder, Apply 1 g topically daily as needed. areas or skinfolds for yeast, Disp: , Rfl: 3 .  pantoprazole (PROTONIX) 40 MG tablet, Take 40 mg by mouth daily. , Disp: , Rfl: 2 .  potassium chloride (K-DUR) 10 MEQ tablet, , Disp: , Rfl:  .  rivaroxaban (XARELTO) 20 MG TABS tablet, Take 1 tablet (20 mg total) by  mouth daily., Disp: 30 tablet, Rfl:  .  simvastatin (ZOCOR) 10 MG tablet, Take 10 mg by mouth daily. HLD- reduced d/t interaction with Diltiazem- increased r/f Rhabdomyelosis, Disp: , Rfl:  .  spironolactone (ALDACTONE) 25 MG tablet, Take 25 mg by mouth daily. , Disp: , Rfl: 2 .  white petrolatum (VASELINE) GEL, Apply 1 application topically as needed (for eyes and face)., Disp: , Rfl:   Past Medical History: Past Medical History:  Diagnosis Date  . Arthritis   . Asthma without status asthmaticus    unspecified  . Breast cancer ([place])   . Coronary artery disease   . Diverticulosis   . DVT (deep venous thrombosis) ([place])   . Edema   . Gout   . Hyperlipidemia   . Hypertension   . MI, old   . Myocardial infarct ([place]) 2004  . Sleep apnea     Tobacco Use: Social History   Tobacco Use  Smoking Status Never Smoker  Smokeless Tobacco Never Used    Labs: Recent Review Flowsheet Data    There is no flowsheet data to display.       Exercise Target Goals: Exercise Program Goal: Individual exercise prescription set using results from initial 6 min walk test and THRR while considering  patient's activity barriers and safety.   Exercise Prescription Goal: Initial exercise prescription  builds to 30-45 minutes a day of aerobic activity, 2-3 days per week.  Home exercise guidelines will be given to patient during program as part of exercise prescription that the participant will acknowledge.  Activity Barriers & Risk Stratification:   6 Minute Walk: 6 Minute Walk    Row Name 07/02/19 1436         6 Minute Walk   Phase  Discharge     Distance  600 feet     Distance % Change  26.3 %     Distance Feet Change  125 ft     Walk Time  6 minutes     # of Rest Breaks  0     MPH  1.14     METS  0.95     RPE  13     VO2 Peak  3.33     Symptoms  Yes (comment)     Resting HR  95 bpm     Resting BP  124/72     Max Ex. HR  90 bpm     Max Ex. BP  150/74        Oxygen Initial  Assessment:   Oxygen Re-Evaluation:   Oxygen Discharge (Final Oxygen Re-Evaluation):   Initial Exercise Prescription:   Perform Capillary Blood Glucose checks as needed.  Exercise Prescription Changes:  Exercise Prescription Changes    Row Name 05/11/19 1000 05/11/19 1100 05/27/19 1300 06/02/19 1200 06/16/19 1100     Response to Exercise   Blood Pressure (Admit)  118/60  -  162/80  126/70  142/82   Blood Pressure (Exercise)  142/70  -  158/68  154/86  142/80   Blood Pressure (Exit)  124/60  -  140/80  148/78  134/80   Heart Rate (Admit)  69 bpm  -  72 bpm  91 bpm  61 bpm   Heart Rate (Exercise)  105 bpm  -  99 bpm  88 bpm  90 bpm   Heart Rate (Exit)  81 bpm  -  84 bpm  79 bpm  75 bpm   Rating of Perceived Exertion (Exercise)  15  -  '13  13  11   '$ Symptoms  fatigue  -  none  none  none   Comments  barely able to keep machines on  -  -  -  -   Duration  Progress to 30 minutes of  aerobic without signs/symptoms of physical distress  -  Continue with 30 min of aerobic exercise without signs/symptoms of physical distress.  Continue with 30 min of aerobic exercise without signs/symptoms of physical distress.  Continue with 30 min of aerobic exercise without signs/symptoms of physical distress.   Intensity  THRR unchanged  -  THRR unchanged  THRR unchanged  THRR unchanged     Progression   Progression  Continue to progress workloads to maintain intensity without signs/symptoms of physical distress.  -  Continue to progress workloads to maintain intensity without signs/symptoms of physical distress.  Continue to progress workloads to maintain intensity without signs/symptoms of physical distress.  Continue to progress workloads to maintain intensity without signs/symptoms of physical distress.   Average METs  1  -  1.5  1.5  1.5     Resistance Training   Training Prescription  Yes  -  Yes  Yes  Yes   Weight  2 lbs  -  2 lbs  2 lbs  2 lbs   Reps  10-15  -  10-15  10-15  10-15      Interval Training   Interval Training  No  -  No  No  No     NuStep   Level  -  -  -  1  1   Minutes  -  -  -  15  15   METs  -  -  -  1  1.1     Arm Ergometer   Level  1  -  -  -  -   Minutes  15  -  -  -  -   METs  1  -  -  -  -     T5 Nustep   Level  -  -  -  -  1   Minutes  -  -  -  -  15   METs  -  -  -  -  1.8     Biostep-RELP   Level  1  -  2  2  -   Minutes  15  -  15  15  -   METs  1  -  2  2  -     Home Exercise Plan   Plans to continue exercise at  -  Home (comment) walking  Home (comment) walking  Home (comment) walking  Home (comment) walking   Frequency  -  Add 2 additional days to program exercise sessions.  Add 2 additional days to program exercise sessions.  Add 2 additional days to program exercise sessions.  Add 2 additional days to program exercise sessions.   Initial Home Exercises Provided  -  12/23/18 sent in mail  12/23/18 sent in mail  12/23/18 sent in mail  12/23/18 sent in mail      Exercise Comments:  Exercise Comments    Row Name 05/25/19 1828           Exercise Comments  Patient post BP elevated. Patient's weight was up 2 lbs, as well. Patient was educated by Heath Lark, RN on sodium intake and keeping track of her weight daily and to notify her doctor when she sees an increase of more than 2-3 lbs in 2 days or 5 lbs in one week. Patient and caregiver voiced understanding. Patient will be referred to our dietitian for more specific recommendations on her sodium intake and meal planning.Patient was advised to take her night meds as usual and call 911 in case of emergency.          Exercise Goals and Review:   Exercise Goals Re-Evaluation : Exercise Goals Re-Evaluation    Row Name 01/26/19 1139 02/18/19 1115 05/04/19 1621 05/07/19 1421 05/27/19 1322     Exercise Goal Re-Evaluation   Exercise Goals Review  Increase Physical Activity;Increase Strength and Stamina;Understanding of Exercise Prescription  Increase Physical Activity;Increase  Strength and Stamina;Understanding of Exercise Prescription  -  Increase Physical Activity;Increase Strength and Stamina;Understanding of Exercise Prescription  Increase Physical Activity;Increase Strength and Stamina;Understanding of Exercise Prescription   Comments  Melinda Maxwell has been walking at home some.  She has continued to have some dizzy spells and was evaulated by her cardiologist.  She is now scheduled for a nuclear stress test next week to see if there have been any changes since last year.  She is also wearing a holter monitor.  She enjoys her walks, especially when able to go with her daughter.   Melinda Maxwell has been walking at home.  She would  like to be able to get our videos.  She will have her daughter email [place] with contact info for her.  She did well on her stress test and they could not determine why she was getting her dizzy spells.  They believe it is a combination of serveral things and will continue with medical management. Recently, she has been feeling better.  -  Melinda Maxwell has been walking some but not much. She returned this week and felt pretty good after her first.   She is hoping that getting back to rehab will help regain strength.  We talked about using a walker versus cane and to build up to 10 min walking bouts.  Melinda Maxwell is doing well in rehab.  She is getting stronger and the walker is helping her get around better.  She is now up to level 2 on the BioStep.  We have held off on the arm crank as she squeezes too tight and struggles some with it.   Expected Outcomes  Short: Continue to walk and do well on stress test.  Long: Continue to get out to move.   Short: Continue to walk daily and get emails of our videos.  Long: Continue to increase activity levels.   -  Short: Walk more at home and use walker.  Long: Continue to increase strength and stamina.  Short: Walk more with walker at home.  Long: Continue to increase strength   Row Name 06/02/19 1249 06/08/19 1640 06/16/19 1052         Exercise  Goal Re-Evaluation   Exercise Goals Review  Increase Physical Activity;Increase Strength and Stamina;Understanding of Exercise Prescription  Increase Physical Activity;Increase Strength and Stamina;Able to understand and use rate of perceived exertion (RPE) scale;Knowledge and understanding of Target Heart Rate Range (THRR);Able to check pulse independently;Understanding of Exercise Prescription  Increase Physical Activity;Increase Strength and Stamina;Understanding of Exercise Prescription     Comments  Melinda Maxwell continues to do well in rehab.  She continues to see improvements each day that she comes. We will continue to monitor her progress.  Melinda Maxwell likes the NuStep much better than Biostep.  Melinda Maxwell says she could tell she was more short of breath beacuse she didnt exercise and just read a book this weekend.  She feels she is stronger - she doesnt get as tired doing ADLs.  Her back bothers her some.We talked about doing shoulder blade squeezes to help with posture.She is also concerned about strengthening her legs.She increased to level 5 on NS for about 10 steps each leg.  It bothered her back briefly but pain went away when she went back to level 1.  Melinda Maxwell is doing well in rehab.  We challenged her to try to do a higher level for 10 steps with each leg each day to help build up strength.  We will continue to monitor her progress.     Expected Outcomes  Short: Continue to attend rehab and walk at home.  Long: Continue to get stronger.  Short - continue to attend rehab and do shoulder squeezes Long - continue to build stamina  Short: Add in her 10 steps at a higher level consistently.  Long: Continue to build strength in legs.        Discharge Exercise Prescription (Final Exercise Prescription Changes): Exercise Prescription Changes - 06/16/19 1100      Response to Exercise   Blood Pressure (Admit)  142/82    Blood Pressure (Exercise)  142/80    Blood Pressure (Exit)  134/80    Heart Rate (Admit)  61 bpm     Heart Rate (Exercise)  90 bpm    Heart Rate (Exit)  75 bpm    Rating of Perceived Exertion (Exercise)  11    Symptoms  none    Duration  Continue with 30 min of aerobic exercise without signs/symptoms of physical distress.    Intensity  THRR unchanged      Progression   Progression  Continue to progress workloads to maintain intensity without signs/symptoms of physical distress.    Average METs  1.5      Resistance Training   Training Prescription  Yes    Weight  2 lbs    Reps  10-15      Interval Training   Interval Training  No      NuStep   Level  1    Minutes  15    METs  1.1      T5 Nustep   Level  1    Minutes  15    METs  1.8      Home Exercise Plan   Plans to continue exercise at  Home (comment)   walking   Frequency  Add 2 additional days to program exercise sessions.    Initial Home Exercises Provided  12/23/18   sent in mail      Nutrition:  Target Goals: Understanding of nutrition guidelines, daily intake of sodium '1500mg'$ , cholesterol '200mg'$ , calories 30% from fat and 7% or less from saturated fats, daily to have 5 or more servings of fruits and vegetables.  Biometrics:    Nutrition Therapy Plan and Nutrition Goals: Nutrition Therapy & Goals - 02/03/19 1328      Nutrition Therapy   Diet  low Na, Heart healthy diet    Protein (specify units)  ~60g    Fiber  25 grams    Whole Grain Foods  3 servings    Saturated Fats  12 max. grams    Fruits and Vegetables  5 servings/day    Sodium  1.5 grams      Personal Nutrition Goals   Nutrition Goal  ST: pt wants to start eating 1 serving of greens /day at least 5x/week ST: eat vitamin c rich foods with meals ST: increase fiber for better bowel health and regularity (prunes and other F/V) ST: limit black tea consumption around mealtime LT:  to be able to walk without shuffling feet with cane LT: be more empowered with healthy eating (her normal) LT: No longer be anemic    Comments  Pt would benefit from  in-person/ visual education. Had RD in past showed portions and wants to know what to eat. She is currently anemic and is on iron pills; stomach pains. Raisin bran in the mornning. Yogurt at night. chicken and rice (pts favortie) with beans and tossed salad with squash casserole with cheese for her birthday. pt reports eating a lot of cheese and will eat a cheese sandwich when all else fails for lunch.  will go to farm to get cabbage, potatoes. Pt reports that is aware what to eat, but isn't really doing it. Discussed MyPlate and general healthy eating. Discussed the importance of fiber and how to increase iron absorption.       Intervention Plan   Intervention  Prescribe, educate and counsel regarding individualized specific dietary modifications aiming towards targeted core components such as weight, hypertension, lipid management, diabetes, heart failure and other comorbidities.    Expected  Outcomes  Short Term Goal: Understand basic principles of dietary content, such as calories, fat, sodium, cholesterol and nutrients.;Short Term Goal: A plan has been developed with personal nutrition goals set during dietitian appointment.;Long Term Goal: Adherence to prescribed nutrition plan.       Nutrition Assessments:   Nutrition Goals Re-Evaluation: Nutrition Goals Re-Evaluation    Melinda Maxwell Name 03/18/19 1332 05/04/19 1737 06/04/19 1420 07/02/19 1414       Goals   Nutrition Goal  ST: pt wants to start eating 1 serving of greens /day at least 5x/week LT: be more empowered with healthy eating (her normal) LT: No longer be anemic  ST: pt wants to start eating 1 serving of greens /day at least 5x/week LT: be more empowered with healthy eating (her normal) LT: No longer be anemic  MW:NUUVOZ Na LT: be more empowered with healthy eating (her normal) LT: No longer be anemic  ST/LT: Continue to make Melinda Maxwell changes    Comment  Been putting off the greens because was nervous about her blood, but spoke with the dr and got  ok from him so will now try to implement that. She says she knows what she needs to do, but hasn't done anything yet.  Pt wants some nutrition content, has not gotten her daughters email, told her will call back to get that from her so that we can send her over some content.   She will eat turnip greens when she goes out but [employer] much at home. Gave pt HH eating guidelines to review as we discussed during virtual rehab. Pt 1st day back, will touch base to talk more.  pt wants to reduce sodium to reduce swelling, doesn't cook with any salt and has been trying to remove snacks; discussed some low Na healthy snacks pt liked the idea of fruit and nuts. Gave Na education, pt receptive.  Pt reports doing well and requested some easy recipes, gave pt some recipes from [email or web address]. Pt reports not needing anything else at tthis time before discharge.    Expected Outcome  Pt no longer anemic, pt empowered with healthy eating  Pt no longer anemic, pt empowered with healthy eating  Pt will reduce BP and swelling  ST/LT: Continue to make Melinda Maxwell changes       Nutrition Goals Discharge (Final Nutrition Goals Re-Evaluation): Nutrition Goals Re-Evaluation - 07/02/19 1414      Goals   Nutrition Goal  ST/LT: Continue to make Melinda Maxwell changes    Comment  Pt reports doing well and requested some easy recipes, gave pt some recipes from [email or web address]. Pt reports not needing anything else at tthis time before discharge.    Expected Outcome  ST/LT: Continue to make HH changes       Psychosocial: Target Goals: Acknowledge presence or absence of significant depression and/or stress, maximize coping skills, provide positive support system. Participant is able to verbalize types and ability to use techniques and skills needed for reducing stress and depression.   Initial Review & Psychosocial Screening:   Quality of Life Scores:   Scores of 19 and below usually indicate a poorer quality of life in these areas.  A difference of   2-3 points is a clinically meaningful difference.  A difference of 2-3 points in the total score of the Quality of Life Index has been associated with significant improvement in overall quality of life, self-image, physical symptoms, and general health in studies assessing change in quality of life.  PHQ-9: Recent Review Flowsheet  Data    Depression screen [facility] 2/9 07/02/2019 12/15/2018   Decreased Interest 1 0   Down, Depressed, Hopeless 0 0   PHQ - 2 Score 1 0   Altered sleeping 1 2   Tired, decreased energy 1 3   Change in appetite 0 0   Feeling bad or failure about yourself  1 0   Trouble concentrating 0 1   Moving slowly or fidgety/restless 2 2   Suicidal thoughts 0 0   PHQ-9 Score 6 8   Difficult doing work/chores Not difficult at all Somewhat difficult     Interpretation of Total Score  Total Score Depression Severity:  1-4 = Minimal depression, 5-9 = Mild depression, 10-14 = Moderate depression, 15-19 = Moderately severe depression, 20-27 = Severe depression   Psychosocial Evaluation and Intervention:   Psychosocial Re-Evaluation: Psychosocial Re-Evaluation    Row Name 01/26/19 1140 02/18/19 1118 05/07/19 1427 06/08/19 1648       Psychosocial Re-Evaluation   Current issues with  Current Stress Concerns;Current Sleep Concerns  Current Stress Concerns;Current Sleep Concerns  Current Stress Concerns  Current Stress Concerns    Comments  Melinda Maxwell is doing well at home mentally.  She has had some dizzy spells and is working with her cardiologist on this.  She is wearing a holter monitor and has a nuclear stress test coming up next week.  This week she is celebrating her 78th birthday!!  She is excited to reach this milestones as her parents both died in their 26s.  Her family sent her cake and dinner this past weekend to celebrate her!  Melinda Maxwell continues do well at home.  Her dizzy spells are getting better.  She still talks with her family regularly.  She is looking forward to getting back  to class!  Melinda Maxwell is doing well.  She is excited to be back in class to regain her strength.  She is sleeping well.  Melinda Maxwell kept her daughters cat for her and she was a little worried due to the different routine.  Otherwise, she has very little stress.    Expected Outcomes  Short: Enjoy her birthday.  Long: Continue to stay positive.   Short: Continue to feel better. Long: Continue to stay positive.   Short: Continue to feel better. Long: Continue to stay positive.   Short - continue exercise Long - maintain positive attitude    Interventions  Encouraged to attend Cardiac Rehabilitation for the exercise  -  Encouraged to attend Cardiac Rehabilitation for the exercise  Encouraged to attend Cardiac Rehabilitation for the exercise    Continue Psychosocial Services   Follow up required by staff  -  -  -       Psychosocial Discharge (Final Psychosocial Re-Evaluation): Psychosocial Re-Evaluation - 06/08/19 1648      Psychosocial Re-Evaluation   Current issues with  Current Stress Concerns    Comments  Melinda Maxwell kept her daughters cat for her and she was a little worried due to the different routine.  Otherwise, she has very little stress.    Expected Outcomes  Short - continue exercise Long - maintain positive attitude    Interventions  Encouraged to attend Cardiac Rehabilitation for the exercise       Vocational Rehabilitation: Provide vocational rehab assistance to qualifying candidates.   Vocational Rehab Evaluation & Intervention:   Education: Education Goals: Education classes will be provided on a variety of topics geared toward better understanding of heart health and risk factor modification. Participant will  state understanding/return demonstration of topics presented as noted by education test scores.  Learning Barriers/Preferences:   Education Topics:  AED/CPR: - Group verbal and written instruction with the use of models to demonstrate the basic use of the AED with the basic ABC's of  resuscitation.   General Nutrition Guidelines/Fats and Fiber: -Group instruction provided by verbal, written material, models and posters to present the general guidelines for heart healthy nutrition. Gives an explanation and review of dietary fats and fiber.   Controlling Sodium/Reading Food Labels: -Group verbal and written material supporting the discussion of sodium use in heart healthy nutrition. Review and explanation with models, verbal and written materials for utilization of the food label.   Cardiac Rehab from 12/18/2018 in [facility] Cardiac and Pulmonary Rehab  Date  12/18/18  Educator  [facility]  Instruction Review Code  1- Verbalizes Understanding      Exercise Physiology & General Exercise Guidelines: - Group verbal and written instruction with models to review the exercise physiology of the cardiovascular system and associated critical values. Provides general exercise guidelines with specific guidelines to those with heart or lung disease.    Aerobic Exercise & Resistance Training: - Gives group verbal and written instruction on the various components of exercise. Focuses on aerobic and resistive training programs and the benefits of this training and how to safely progress through these programs..   Flexibility, Balance, Mind/Body Relaxation: Provides group verbal/written instruction on the benefits of flexibility and balance training, including mind/body exercise modes such as yoga, pilates and tai chi.  Demonstration and skill practice provided.   Stress and Anxiety: - Provides group verbal and written instruction about the health risks of elevated stress and causes of high stress.  Discuss the correlation between heart/lung disease and anxiety and treatment options. Review healthy ways to manage with stress and anxiety.   Depression: - Provides group verbal and written instruction on the correlation between heart/lung disease and depressed mood, treatment options, and the  stigmas associated with seeking treatment.   Anatomy & Physiology of the Heart: - Group verbal and written instruction and models provide basic cardiac anatomy and physiology, with the coronary electrical and arterial systems. Review of Valvular disease and Heart Failure   Cardiac Procedures: - Group verbal and written instruction to review commonly prescribed medications for heart disease. Reviews the medication, class of the drug, and side effects. Includes the steps to properly store meds and maintain the prescription regimen. (beta blockers and nitrates)   Cardiac Medications I: - Group verbal and written instruction to review commonly prescribed medications for heart disease. Reviews the medication, class of the drug, and side effects. Includes the steps to properly store meds and maintain the prescription regimen.   Cardiac Medications II: -Group verbal and written instruction to review commonly prescribed medications for heart disease. Reviews the medication, class of the drug, and side effects. (all other drug classes)    Go Sex-Intimacy & Heart Disease, Get SMART - Goal Setting: - Group verbal and written instruction through game format to discuss heart disease and the return to sexual intimacy. Provides group verbal and written material to discuss and apply goal setting through the application of the S.M.A.R.T. Method.   Other Matters of the Heart: - Provides group verbal, written materials and models to describe Stable Angina and Peripheral Artery. Includes description of the disease process and treatment options available to the cardiac patient.   Exercise & Equipment Safety: - Individual verbal instruction and demonstration of equipment use and  safety with use of the equipment.   Cardiac Rehab from 12/18/2018 in [facility]  Date  12/15/18  Educator  [facility]  Instruction Review Code  1- Verbalizes Understanding      Infection Prevention: - Provides  verbal and written material to individual with discussion of infection control including proper hand washing and proper equipment cleaning during exercise session.   Cardiac Rehab from 12/18/2018 in [facility]  Date  12/15/18  Educator  [facility]  Instruction Review Code  1- Verbalizes Understanding      Falls Prevention: - Provides verbal and written material to individual with discussion of falls prevention and safety.   Cardiac Rehab from 12/18/2018 in [facility]  Date  12/15/18  Educator  [facility]  Instruction Review Code  1- Verbalizes Understanding      Diabetes: - Individual verbal and written instruction to review signs/symptoms of diabetes, desired ranges of glucose level fasting, after meals and with exercise. Acknowledge that pre and post exercise glucose checks will be done for 3 sessions at entry of program.   Know Your Numbers and Risk Factors: -Group verbal and written instruction about important numbers in your health.  Discussion of what are risk factors and how they play a role in the disease process.  Review of Cholesterol, Blood Pressure, Diabetes, and BMI and the role they play in your overall health.   Sleep Hygiene: -Provides group verbal and written instruction about how sleep can affect your health.  Define sleep hygiene, discuss sleep cycles and impact of sleep habits. Review good sleep hygiene tips.    Other: -Provides group and verbal instruction on various topics (see comments)   Knowledge Questionnaire Score:   Core Components/Risk Factors/Patient Goals at Admission:   Core Components/Risk Factors/Patient Goals Review:  Goals and Risk Factor Review    Row Name 01/26/19 1142 02/18/19 1119 05/07/19 1428 06/08/19 1644       Core Components/Risk Factors/Patient Goals Review   Personal Goals Review  Weight Management/Obesity;Hypertension;Lipids  Weight Management/Obesity;Hypertension;Lipids  Weight  Management/Obesity;Hypertension;Lipids  Weight Management/Obesity;Hypertension;Lipids    Review  Melinda [facility] is doing well at home.  She is still not able to weight as she doesn't have a scale.  Her sister brought her a blood pressure cuff, but she is not sure how to use it.  We will continue to check in with her as she is eager to get back to classes.   Melinda Maxwell is feeling better.  Seh still is not checking her weight or blood pressure at home since she does not have the equipment.  She had good numbers when she went to the doctor.   Melinda Maxwell's weight has gone up some but overall doing well.  Her pressures have been good in class.  She will be able to keep a better eye on these now that she is back in class.  Melinda Maxwell weight was up today but she ate restaurant food yesterday.  Melinda Maxwell BP has been good most days at rehab.  She has had 1 or 2 days with higher measurements.    Expected Outcomes  Short: Continue to watch that she doesn't feel like she gains too much weight.  Long: Continue to monitor symptoms of risk factors.   Short: Continue to exercise to maintain weight and monitor symptoms.  Long: Continue to monitor risk factors at appointments.   -  Short - continue to be aware of sodium Long - develop heart healthy habits overall  Core Components/Risk Factors/Patient Goals at Discharge (Final Review):  Goals and Risk Factor Review - 06/08/19 1644      Core Components/Risk Factors/Patient Goals Review   Personal Goals Review  Weight Management/Obesity;Hypertension;Lipids    Review  Melinda Maxwell weight was up today but she ate restaurant food yesterday.  Melinda Maxwell BP has been good most days at rehab.  She has had 1 or 2 days with higher measurements.    Expected Outcomes  Short - continue to be aware of sodium Long - develop heart healthy habits overall       ITP Comments: ITP Comments    Row Name 04/22/19 1341 05/04/19 1621 05/20/19 0613 06/17/19 0623 07/02/19 1405   ITP Comments  30 day review cycle restarting  after  being closed since March 16 because of  Covid 19 pandemic. Program opened to patients on July 6. Not all have returned. ITP updated and sent to Medical Director for review,changes as needed and signature  -  30 Day Review Completed today. Continue with ITP unless changed by Medical Director review.  30 Day review. Continue with ITP unless directed changes per Medical Director review.  Terree graduated today from  rehab with 20 sessions completed.  Details of the patient's exercise prescription and what She needs to do in order to continue the prescription and progress were discussed with patient.  Patient was given a copy of prescription and goals.  Patient verbalized understanding.  Melinda Maxwell plans to continue to exercise by walking at home.      Comments: Discharge ITP

## 2019-07-02 NOTE — Patient Instructions (Addendum)
Discharge Patient Instructions  Patient Details  Name: Melinda [facility] MRN: [id number] Date of Birth: [birth year] Referring Provider:  [name], MD   Number of Visits: 20  Reason for Discharge:  Patient has met program and personal goals.  Smoking History:  Social History   Tobacco Use  Smoking Status Never Smoker  Smokeless Tobacco Never Used    Diagnosis:  S/P PTCA (percutaneous transluminal coronary angioplasty)  Initial Exercise Prescription:   Discharge Exercise Prescription (Final Exercise Prescription Changes): Exercise Prescription Changes - 06/16/19 1100      Response to Exercise   Blood Pressure (Admit)  142/82    Blood Pressure (Exercise)  142/80    Blood Pressure (Exit)  134/80    Heart Rate (Admit)  61 bpm    Heart Rate (Exercise)  90 bpm    Heart Rate (Exit)  75 bpm    Rating of Perceived Exertion (Exercise)  11    Symptoms  none    Duration  Continue with 30 min of aerobic exercise without signs/symptoms of physical distress.    Intensity  THRR unchanged      Progression   Progression  Continue to progress workloads to maintain intensity without signs/symptoms of physical distress.    Average METs  1.5      Resistance Training   Training Prescription  Yes    Weight  2 lbs    Reps  10-15      Interval Training   Interval Training  No      NuStep   Level  1    Minutes  15    METs  1.1      T5 Nustep   Level  1    Minutes  15    METs  1.8      Home Exercise Plan   Plans to continue exercise at  Home (comment)   walking   Frequency  Add 2 additional days to program exercise sessions.    Initial Home Exercises Provided  12/23/18   sent in mail      Functional Capacity: [place] Name 07/02/19 1436         6 Minute Walk   Phase  Discharge     Distance  600 feet     Distance % Change  26.3 %     Distance Feet Change  125 ft     Walk Time  6 minutes     # of Rest Breaks  0     MPH  1.14     METS  0.95      RPE  13     VO2 Peak  3.33     Symptoms  Yes (comment)     Resting HR  95 bpm     Resting BP  124/72     Max Ex. HR  90 bpm     Max Ex. BP  150/74        Quality of Life:   Personal Goals: Goals established at orientation with interventions provided to work toward goal.    Personal Goals Discharge: Goals and Risk Factor Review - 06/08/19 1644      Core Components/Risk Factors/Patient Goals Review   Personal Goals Review  Weight Management/Obesity;Hypertension;Lipids    Review  Melinda Maxwell weight was up today but Melinda Maxwell ate restaurant food yesterday.  Melinda Maxwell BP has been good most days at rehab.  Melinda Maxwell has had 1 or 2 days with higher measurements.  Expected Outcomes  Short - continue to be aware of sodium Long - develop heart healthy habits overall       Exercise Goals and Review:   Exercise Goals Re-Evaluation: Exercise Goals Re-Evaluation    Row Name 01/26/19 1139 02/18/19 1115 05/04/19 1621 05/07/19 1421 05/27/19 1322     Exercise Goal Re-Evaluation   Exercise Goals Review  Increase Physical Activity;Increase Strength and Stamina;Understanding of Exercise Prescription  Increase Physical Activity;Increase Strength and Stamina;Understanding of Exercise Prescription  -  Increase Physical Activity;Increase Strength and Stamina;Understanding of Exercise Prescription  Increase Physical Activity;Increase Strength and Stamina;Understanding of Exercise Prescription   Comments  Melinda Maxwell has been walking at home some.  Melinda Maxwell has continued to have some dizzy spells and was evaulated by her cardiologist.  Melinda Maxwell is now scheduled for a nuclear stress test next week to see if there have been any changes since last year.  Melinda Maxwell is also wearing a holter monitor.  Melinda Maxwell enjoys her walks, especially when able to go with her daughter.   Melinda Maxwell has been walking at home.  Melinda Maxwell would like to be able to get our videos.  Melinda Maxwell will have her daughter email [place] with contact info for her.  Melinda Maxwell did well on her stress test and they  could not determine why Melinda Maxwell was getting her dizzy spells.  They believe it is a combination of serveral things and will continue with medical management. Recently, Melinda Maxwell has been feeling better.  -  Melinda Maxwell has been walking some but not much. Melinda Maxwell returned this week and felt pretty good after her first.   Melinda Maxwell is hoping that getting back to rehab will help regain strength.  We talked about using a walker versus cane and to build up to 10 min walking bouts.  Melinda Maxwell is doing well in rehab.  Melinda Maxwell is getting stronger and the walker is helping her get around better.  Melinda Maxwell is now up to level 2 on the BioStep.  We have held off on the arm crank as Melinda Maxwell squeezes too tight and struggles some with it.   Expected Outcomes  Short: Continue to walk and do well on stress test.  Long: Continue to get out to move.   Short: Continue to walk daily and get emails of our videos.  Long: Continue to increase activity levels.   -  Short: Walk more at home and use walker.  Long: Continue to increase strength and stamina.  Short: Walk more with walker at home.  Long: Continue to increase strength   Row Name 06/02/19 1249 06/08/19 1640 06/16/19 1052         Exercise Goal Re-Evaluation   Exercise Goals Review  Increase Physical Activity;Increase Strength and Stamina;Understanding of Exercise Prescription  Increase Physical Activity;Increase Strength and Stamina;Able to understand and use rate of perceived exertion (RPE) scale;Knowledge and understanding of Target Heart Rate Range (THRR);Able to check pulse independently;Understanding of Exercise Prescription  Increase Physical Activity;Increase Strength and Stamina;Understanding of Exercise Prescription     Comments  Melinda Maxwell continues to do well in rehab.  Melinda Maxwell continues to see improvements each day that Melinda Maxwell comes. We will continue to monitor her progress.  Melinda Maxwell likes the NuStep much better than Biostep.  Melinda Maxwell says Melinda Maxwell could tell Melinda Maxwell was more short of breath beacuse Melinda Maxwell didnt exercise and just read a  book this weekend.  Melinda Maxwell feels Melinda Maxwell is stronger - Melinda Maxwell doesnt get as tired doing ADLs.  Her back bothers her some.We talked about doing shoulder blade squeezes  to help with posture.Melinda Maxwell is also concerned about strengthening her legs.Melinda Maxwell increased to level 5 on NS for about 10 steps each leg.  It bothered her back briefly but pain went away when Melinda Maxwell went back to level 1.  Joyceline is doing well in rehab.  We challenged her to try to do a higher level for 10 steps with each leg each day to help build up strength.  We will continue to monitor her progress.     Expected Outcomes  Short: Continue to attend rehab and walk at home.  Long: Continue to get stronger.  Short - continue to attend rehab and do shoulder squeezes Long - continue to build stamina  Short: Add in her 10 steps at a higher level consistently.  Long: Continue to build strength in legs.        Nutrition & Weight - Outcomes:    Nutrition: Nutrition Therapy & Goals - 02/03/19 1328      Nutrition Therapy   Diet  low Na, Heart healthy diet    Protein (specify units)  ~60g    Fiber  25 grams    Whole Grain Foods  3 servings    Saturated Fats  12 max. grams    Fruits and Vegetables  5 servings/day    Sodium  1.5 grams      Personal Nutrition Goals   Nutrition Goal  ST: pt wants to start eating 1 serving of greens /day at least 5x/week ST: eat vitamin c rich foods with meals ST: increase fiber for better bowel health and regularity (prunes and other F/V) ST: limit black tea consumption around mealtime LT:  to be able to walk without shuffling feet with cane LT: be more empowered with healthy eating (her normal) LT: No longer be anemic    Comments  Pt would benefit from in-person/ visual education. Had RD in past showed portions and wants to know what to eat. Melinda Maxwell is currently anemic and is on iron pills; stomach pains. Raisin bran in the mornning. Yogurt at night. chicken and rice (pts favortie) with beans and tossed salad with squash casserole  with cheese for her birthday. pt reports eating a lot of cheese and will eat a cheese sandwich when all else fails for lunch.  will go to farm to get cabbage, potatoes. Pt reports that is aware what to eat, but isn't really doing it. Discussed MyPlate and general healthy eating. Discussed the importance of fiber and how to increase iron absorption.       Intervention Plan   Intervention  Prescribe, educate and counsel regarding individualized specific dietary modifications aiming towards targeted core components such as weight, hypertension, lipid management, diabetes, heart failure and other comorbidities.    Expected Outcomes  Short Term Goal: Understand basic principles of dietary content, such as calories, fat, sodium, cholesterol and nutrients.;Short Term Goal: A plan has been developed with personal nutrition goals set during dietitian appointment.;Long Term Goal: Adherence to prescribed nutrition plan.       Nutrition Discharge:   Education Questionnaire Score:   Goals reviewed with patient; copy given to patient.

## 2019-07-02 NOTE — Progress Notes (Signed)
Daily Session Note  Patient Details  Name: Melinda [facility] MRN: [id number] Date of Birth: 03-24-1941 Referring Provider:     Cardiac Rehab from 12/15/2018 in [facility]  Referring Provider  Frazier Richards MD      Encounter Date: 07/02/2019  Check In: Session Check In - 07/02/19 1403      Check-In   Supervising physician immediately available to respond to emergencies  See telemetry face sheet for immediately available ER MD    Location  [facility]iac [facility] Pulmonary Rehab    Staff Present  Renita Papa, RN BSN;Jessica Luan Pulling, MA, RCEP, CCRP, CCET    Virtual Visit  No    Medication changes reported      No    Fall or balance concerns reported     No    Warm-up and Cool-down  Performed on first and last piece of equipment    Resistance Training Performed  Yes    VAD Patient?  No    PAD/SET Patient?  No      Pain Assessment   Currently in Pain?  No/denies          Social History   Tobacco Use  Smoking Status Never Smoker  Smokeless Tobacco Never Used    Goals Met:  Independence with exercise equipment Exercise tolerated well No report of cardiac concerns or symptoms Strength training completed today  Goals Unmet:  Not Applicable  Comments:  6 Minute Walk    Row Name 07/02/19 1436         6 Minute Walk   Phase  Discharge     Distance  600 feet     Distance % Change  26.3 %     Distance Feet Change  125 ft     Walk Time  6 minutes     # of Rest Breaks  0     MPH  1.14     METS  0.95     RPE  13     VO2 Peak  3.33     Symptoms  Yes (comment)     Resting HR  95 bpm     Resting BP  124/72     Max Ex. HR  90 bpm     Max Ex. BP  150/74        Takiya graduated today from  rehab with 20 sessions completed.  Details of the patient's exercise prescription and what She needs to do in order to continue the prescription and progress were discussed with patient.  Patient was given a copy of prescription and goals.  Patient verbalized  understanding.  Dawson plans to continue to exercise by walking at home.    Dr. Emily Filbert is Medical Director for [facility].

## 2019-07-16 ENCOUNTER — Inpatient Hospital Stay (HOSPITAL_COMMUNITY): Payer: Medicare Other

## 2019-07-16 ENCOUNTER — Inpatient Hospital Stay (HOSPITAL_COMMUNITY)
Admission: EM | Admit: 2019-07-16 | Discharge: 2019-07-25 | DRG: 083 | Disposition: A | Discharge status: Skilled Nursing Facility | Payer: Medicare Other | Attending: Internal Medicine | Admitting: Internal Medicine

## 2019-07-16 ENCOUNTER — Emergency Department (HOSPITAL_COMMUNITY): Payer: Medicare Other

## 2019-07-16 ENCOUNTER — Encounter (HOSPITAL_COMMUNITY): Payer: Self-pay | Admitting: Emergency Medicine

## 2019-07-16 ENCOUNTER — Other Ambulatory Visit: Payer: Self-pay

## 2019-07-16 ENCOUNTER — Other Ambulatory Visit (HOSPITAL_COMMUNITY): Payer: Medicare Other

## 2019-07-16 DIAGNOSIS — Z955 Presence of coronary angioplasty implant and graft: Secondary | ICD-10-CM | POA: Diagnosis not present

## 2019-07-16 DIAGNOSIS — I82509 Chronic embolism and thrombosis of unspecified deep veins of unspecified lower extremity: Secondary | ICD-10-CM | POA: Diagnosis present

## 2019-07-16 DIAGNOSIS — I5033 Acute on chronic diastolic (congestive) heart failure: Secondary | ICD-10-CM | POA: Diagnosis present

## 2019-07-16 DIAGNOSIS — I951 Orthostatic hypotension: Secondary | ICD-10-CM | POA: Diagnosis not present

## 2019-07-16 DIAGNOSIS — R269 Unspecified abnormalities of gait and mobility: Secondary | ICD-10-CM | POA: Diagnosis present

## 2019-07-16 DIAGNOSIS — Z882 Allergy status to sulfonamides status: Secondary | ICD-10-CM

## 2019-07-16 DIAGNOSIS — S42212A Unspecified displaced fracture of surgical neck of left humerus, initial encounter for closed fracture: Secondary | ICD-10-CM | POA: Diagnosis present

## 2019-07-16 DIAGNOSIS — D649 Anemia, unspecified: Secondary | ICD-10-CM | POA: Diagnosis present

## 2019-07-16 DIAGNOSIS — I472 Ventricular tachycardia: Secondary | ICD-10-CM | POA: Diagnosis present

## 2019-07-16 DIAGNOSIS — R8281 Pyuria: Secondary | ICD-10-CM | POA: Diagnosis present

## 2019-07-16 DIAGNOSIS — Z86718 Personal history of other venous thrombosis and embolism: Secondary | ICD-10-CM

## 2019-07-16 DIAGNOSIS — Z79899 Other long term (current) drug therapy: Secondary | ICD-10-CM

## 2019-07-16 DIAGNOSIS — I493 Ventricular premature depolarization: Secondary | ICD-10-CM | POA: Diagnosis present

## 2019-07-16 DIAGNOSIS — I130 Hypertensive heart and chronic kidney disease with heart failure and stage 1 through stage 4 chronic kidney disease, or unspecified chronic kidney disease: Secondary | ICD-10-CM | POA: Diagnosis present

## 2019-07-16 DIAGNOSIS — W010XXA Fall on same level from slipping, tripping and stumbling without subsequent striking against object, initial encounter: Secondary | ICD-10-CM | POA: Diagnosis present

## 2019-07-16 DIAGNOSIS — R55 Syncope and collapse: Secondary | ICD-10-CM | POA: Diagnosis not present

## 2019-07-16 DIAGNOSIS — Z853 Personal history of malignant neoplasm of breast: Secondary | ICD-10-CM | POA: Diagnosis not present

## 2019-07-16 DIAGNOSIS — G4733 Obstructive sleep apnea (adult) (pediatric): Secondary | ICD-10-CM | POA: Diagnosis present

## 2019-07-16 DIAGNOSIS — E1122 Type 2 diabetes mellitus with diabetic chronic kidney disease: Secondary | ICD-10-CM | POA: Diagnosis present

## 2019-07-16 DIAGNOSIS — I480 Paroxysmal atrial fibrillation: Secondary | ICD-10-CM | POA: Diagnosis present

## 2019-07-16 DIAGNOSIS — J45909 Unspecified asthma, uncomplicated: Secondary | ICD-10-CM | POA: Diagnosis present

## 2019-07-16 DIAGNOSIS — E861 Hypovolemia: Secondary | ICD-10-CM | POA: Diagnosis present

## 2019-07-16 DIAGNOSIS — Z7902 Long term (current) use of antithrombotics/antiplatelets: Secondary | ICD-10-CM

## 2019-07-16 DIAGNOSIS — I5032 Chronic diastolic (congestive) heart failure: Secondary | ICD-10-CM | POA: Diagnosis present

## 2019-07-16 DIAGNOSIS — E785 Hyperlipidemia, unspecified: Secondary | ICD-10-CM | POA: Diagnosis present

## 2019-07-16 DIAGNOSIS — I825Y9 Chronic embolism and thrombosis of unspecified deep veins of unspecified proximal lower extremity: Secondary | ICD-10-CM | POA: Diagnosis not present

## 2019-07-16 DIAGNOSIS — Z7901 Long term (current) use of anticoagulants: Secondary | ICD-10-CM

## 2019-07-16 DIAGNOSIS — I252 Old myocardial infarction: Secondary | ICD-10-CM | POA: Diagnosis not present

## 2019-07-16 DIAGNOSIS — Z888 Allergy status to other drugs, medicaments and biological substances status: Secondary | ICD-10-CM

## 2019-07-16 DIAGNOSIS — R008 Other abnormalities of heart beat: Secondary | ICD-10-CM | POA: Diagnosis present

## 2019-07-16 DIAGNOSIS — I25118 Atherosclerotic heart disease of native coronary artery with other forms of angina pectoris: Secondary | ICD-10-CM

## 2019-07-16 DIAGNOSIS — Z20828 Contact with and (suspected) exposure to other viral communicable diseases: Secondary | ICD-10-CM | POA: Diagnosis present

## 2019-07-16 DIAGNOSIS — I2510 Atherosclerotic heart disease of native coronary artery without angina pectoris: Secondary | ICD-10-CM | POA: Diagnosis present

## 2019-07-16 DIAGNOSIS — S065X9A Traumatic subdural hemorrhage with loss of consciousness of unspecified duration, initial encounter: Secondary | ICD-10-CM | POA: Diagnosis present

## 2019-07-16 DIAGNOSIS — Z8249 Family history of ischemic heart disease and other diseases of the circulatory system: Secondary | ICD-10-CM

## 2019-07-16 DIAGNOSIS — I4729 Other ventricular tachycardia: Secondary | ICD-10-CM

## 2019-07-16 DIAGNOSIS — N183 Chronic kidney disease, stage 3 unspecified: Secondary | ICD-10-CM | POA: Diagnosis present

## 2019-07-16 DIAGNOSIS — Y92003 Bedroom of unspecified non-institutional (private) residence as the place of occurrence of the external cause: Secondary | ICD-10-CM | POA: Diagnosis not present

## 2019-07-16 DIAGNOSIS — M25512 Pain in left shoulder: Secondary | ICD-10-CM | POA: Diagnosis not present

## 2019-07-16 DIAGNOSIS — S065XAA Traumatic subdural hemorrhage with loss of consciousness status unknown, initial encounter: Secondary | ICD-10-CM | POA: Diagnosis present

## 2019-07-16 DIAGNOSIS — W19XXXA Unspecified fall, initial encounter: Secondary | ICD-10-CM

## 2019-07-16 LAB — CBC WITH DIFFERENTIAL/PLATELET
Abs Immature Granulocytes: 0.04 10*3/uL (ref 0.00–0.07)
Basophils Absolute: 0 10*3/uL (ref 0.0–0.1)
Basophils Relative: 0 %
Eosinophils Absolute: 0 10*3/uL (ref 0.0–0.5)
Eosinophils Relative: 0 %
HCT: 31 % — ABNORMAL LOW (ref 36.0–46.0)
Hemoglobin: 9.6 g/dL — ABNORMAL LOW (ref 12.0–15.0)
Immature Granulocytes: 0 %
Lymphocytes Relative: 11 %
Lymphs Abs: 1.2 10*3/uL (ref 0.7–4.0)
MCH: 29.9 pg (ref 26.0–34.0)
MCHC: 31 g/dL (ref 30.0–36.0)
MCV: 96.6 fL (ref 80.0–100.0)
Monocytes Absolute: 0.5 10*3/uL (ref 0.1–1.0)
Monocytes Relative: 5 %
Neutro Abs: 8.8 10*3/uL — ABNORMAL HIGH (ref 1.7–7.7)
Neutrophils Relative %: 84 %
Platelets: 231 10*3/uL (ref 150–400)
RBC: 3.21 MIL/uL — ABNORMAL LOW (ref 3.87–5.11)
RDW: 15.6 % — ABNORMAL HIGH (ref 11.5–15.5)
WBC: 10.5 10*3/uL (ref 4.0–10.5)
nRBC: 0 % (ref 0.0–0.2)

## 2019-07-16 LAB — COMPREHENSIVE METABOLIC PANEL
ALT: 12 U/L (ref 0–44)
AST: 18 U/L (ref 15–41)
Albumin: 3.6 g/dL (ref 3.5–5.0)
Alkaline Phosphatase: 127 U/L — ABNORMAL HIGH (ref 38–126)
Anion gap: 14 (ref 5–15)
BUN: 33 mg/dL — ABNORMAL HIGH (ref 8–23)
CO2: 22 mmol/L (ref 22–32)
Calcium: 10.4 mg/dL — ABNORMAL HIGH (ref 8.9–10.3)
Chloride: 104 mmol/L (ref 98–111)
Creatinine, Ser: 1.3 mg/dL — ABNORMAL HIGH (ref 0.44–1.00)
GFR calc Af Amer: 45 mL/min — ABNORMAL LOW (ref >60)
GFR calc non Af Amer: 39 mL/min — ABNORMAL LOW (ref >60)
Glucose, Bld: 168 mg/dL — ABNORMAL HIGH (ref 70–99)
Potassium: 4.2 mmol/L (ref 3.5–5.1)
Sodium: 140 mmol/L (ref 135–145)
Total Bilirubin: 0.8 mg/dL (ref 0.3–1.2)
Total Protein: 7.2 g/dL (ref 6.5–8.1)

## 2019-07-16 LAB — CBG MONITORING, ED
Glucose-Capillary: 147 mg/dL — ABNORMAL HIGH (ref 70–99)
Glucose-Capillary: 120 mg/dL — ABNORMAL HIGH (ref 70–99)

## 2019-07-16 LAB — ECHOCARDIOGRAM COMPLETE

## 2019-07-16 LAB — SARS CORONAVIRUS 2 (TAT 6-24 HRS): SARS Coronavirus 2: NEGATIVE

## 2019-07-16 LAB — MAGNESIUM: Magnesium: 1.9 mg/dL (ref 1.7–2.4)

## 2019-07-16 LAB — TROPONIN I (HIGH SENSITIVITY)
Troponin I (High Sensitivity): 9 ng/L (ref <18)
Troponin I (High Sensitivity): 11 ng/L (ref <18)

## 2019-07-16 MED ORDER — ONDANSETRON HCL 4 MG/2ML IJ SOLN
4.0000 mg | Freq: Once | INTRAMUSCULAR | Status: AC
  Administered 2019-07-16: 4 mg via INTRAVENOUS
  Filled 2019-07-16: qty 2

## 2019-07-16 MED ORDER — LABETALOL HCL 5 MG/ML IV SOLN
5.0000 mg | INTRAVENOUS | Status: AC | PRN
  Administered 2019-07-20 (×2): 5 mg via INTRAVENOUS
  Filled 2019-07-16 (×2): qty 4

## 2019-07-16 MED ORDER — FENTANYL CITRATE (PF) 100 MCG/2ML IJ SOLN
25.0000 ug | Freq: Once | INTRAMUSCULAR | Status: AC
  Administered 2019-07-16: 25 ug via INTRAVENOUS
  Filled 2019-07-16: qty 2

## 2019-07-16 MED ORDER — ACETAMINOPHEN 325 MG PO TABS
650.0000 mg | ORAL_TABLET | Freq: Four times a day (QID) | ORAL | Status: DC | PRN
  Administered 2019-07-17 – 2019-07-24 (×9): 650 mg via ORAL
  Filled 2019-07-16 (×9): qty 2

## 2019-07-16 MED ORDER — FENTANYL CITRATE (PF) 100 MCG/2ML IJ SOLN
25.0000 ug | INTRAMUSCULAR | Status: DC | PRN
  Administered 2019-07-16: 20:00:00 25 ug via INTRAVENOUS
  Administered 2019-07-16 (×2): 50 ug via INTRAVENOUS
  Administered 2019-07-17: 25 ug via INTRAVENOUS
  Filled 2019-07-16 (×4): qty 2

## 2019-07-16 MED ORDER — SODIUM CHLORIDE 0.9% FLUSH: 3.0000 mL | INTRAVENOUS | Status: DC | PRN

## 2019-07-16 MED ORDER — SODIUM CHLORIDE 0.9 % IV SOLN: 250.0000 mL | INTRAVENOUS | Status: DC | PRN

## 2019-07-16 MED ORDER — SODIUM CHLORIDE 0.9% FLUSH
3.0000 mL | Freq: Two times a day (BID) | INTRAVENOUS | Status: DC
  Administered 2019-07-16 – 2019-07-18 (×4): 3 mL via INTRAVENOUS

## 2019-07-16 MED ORDER — ONDANSETRON HCL 4 MG/2ML IJ SOLN: 4.0000 mg | Freq: Four times a day (QID) | INTRAMUSCULAR | Status: DC | PRN

## 2019-07-16 MED ORDER — SODIUM CHLORIDE 0.9% FLUSH
3.0000 mL | Freq: Two times a day (BID) | INTRAVENOUS | Status: DC
  Administered 2019-07-16 – 2019-07-25 (×16): 3 mL via INTRAVENOUS

## 2019-07-16 MED ORDER — TORSEMIDE 20 MG PO TABS
20.0000 mg | ORAL_TABLET | Freq: Two times a day (BID) | ORAL | Status: DC
  Administered 2019-07-16: 20 mg via ORAL
  Filled 2019-07-16: qty 1

## 2019-07-16 MED ORDER — ACETAMINOPHEN 650 MG RE SUPP: 650.0000 mg | Freq: Four times a day (QID) | RECTAL | Status: DC | PRN

## 2019-07-16 MED ORDER — DILTIAZEM HCL 30 MG PO TABS
30.0000 mg | ORAL_TABLET | Freq: Two times a day (BID) | ORAL | Status: DC
  Administered 2019-07-16 – 2019-07-20 (×10): 30 mg via ORAL
  Filled 2019-07-16 (×10): qty 1

## 2019-07-16 MED ORDER — ONDANSETRON HCL 4 MG PO TABS: 4.0000 mg | ORAL_TABLET | Freq: Four times a day (QID) | ORAL | Status: DC | PRN

## 2019-07-16 MED ORDER — SIMVASTATIN 20 MG PO TABS
10.0000 mg | ORAL_TABLET | Freq: Every day | ORAL | Status: DC
  Administered 2019-07-16 – 2019-07-24 (×9): 10 mg via ORAL
  Filled 2019-07-16 (×9): qty 1

## 2019-07-16 MED ORDER — POLYETHYLENE GLYCOL 3350 17 G PO PACK
17.0000 g | PACK | Freq: Every day | ORAL | Status: DC | PRN
  Administered 2019-07-21: 17 g via ORAL
  Filled 2019-07-16: qty 1

## 2019-07-16 MED ORDER — DILTIAZEM HCL ER COATED BEADS 120 MG PO CP24
120.0000 mg | ORAL_CAPSULE | Freq: Every day | ORAL | Status: DC
  Filled 2019-07-16: qty 1

## 2019-07-16 NOTE — ED Triage Notes (Signed)
Patient from home, fell while going to bathroom.  She did have a LOC, on two blood thinners.  Patient was found in Afib with 2 runs of vtach, 6 secs in duration for both.  Patient is CAOx4 at this time.

## 2019-07-16 NOTE — ED Notes (Signed)
Pt BP elevated this AM.   Msg sent to Neuro and admitting MD.

## 2019-07-16 NOTE — ED Notes (Signed)
purewick put in place.

## 2019-07-16 NOTE — ED Notes (Signed)
Pt alert and oriented with slightly slurred words.  States pain remains a 10 but has no outward pain responses, drifts off to sleep.  POC reviewed and pt has no questions.

## 2019-07-16 NOTE — ED Notes (Signed)
Pt able to sit upright with assist for lunch.  Alert and pleasant, able to eat lunch (I).   Denies any current pain or needs.  Does ask if her family has called or is waiting out front.  No current calls noted.  Will attempt to call sister after she eats.

## 2019-07-16 NOTE — Consult Note (Signed)
Reason for Consult:Left humerus fx Referring Physician: Gala Lewandowsky  Melinda Maxwell is an 78 y.o. female.  HPI: Melinda Maxwell was going to bed last night when she lost her balance and fell. She had immediate left shoulder pain and hit her head. She was brought to the ED where x-rays showed a left humerus fx in addition to a SDH and orthopedic surgery was consulted. She is RHD.  Past Medical History:  Diagnosis Date  . Arthritis   . Asthma without status asthmaticus    unspecified  . Breast cancer ([place])   . Coronary artery disease   . Diverticulosis   . DVT (deep venous thrombosis) ([place])   . Edema   . Gout   . Hyperlipidemia   . Hypertension   . MI, old   . Myocardial infarct ([place]) 2004  . Sleep apnea     Past Surgical History:  Procedure Laterality Date  . ABDOMINAL HYSTERECTOMY    . APPENDECTOMY    . BREAST LUMPECTOMY     L breast  . CATARACT EXTRACTION    . COLONOSCOPY     08/27/1989, 01/03/1998, 06/09/2004  . CORONARY STENT INTERVENTION N/A 02/11/2018   Procedure: CORONARY STENT INTERVENTION;  Surgeon: Nelva Bush, MD;  Location: [facility];  Service: Cardiovascular;  Laterality: N/A;  . ESOPHAGOGASTRODUODENOSCOPY  06/09/2004  . FLEXIBLE SIGMOIDOSCOPY    . HEMICOLECTOMY    . LEFT HEART CATH AND CORONARY ANGIOGRAPHY Left 02/11/2018   Procedure: LEFT HEART CATH AND CORONARY ANGIOGRAPHY;  Surgeon: Corey Skains, MD;  Location: [facility];  Service: Cardiovascular;  Laterality: Left;  . ORIF ANKLE FRACTURE  2004    Family History  Problem Relation Age of Onset  . Angina Mother   . Hypertension Mother   . Lung cancer Mother   . Hypertension Father   . Heart attack Father   . Nephrolithiasis Sister   . Hypertension Other        sibling    Social History:  reports that she has never smoked. She has never used smokeless tobacco. She reports that she does not drink alcohol or use drugs.  Allergies:  Allergies  Allergen Reactions  . Benzocaine Other (See  Comments)    Unknown  . Lisinopril Cough  . Neomycin Other (See Comments)    On allergy testing  . Sulfamethoxazole-Trimethoprim Other (See Comments)    Unknown  . Sulfa Antibiotics Rash    Medications: I have reviewed the patient's current medications.  Results for orders placed or performed during the hospital encounter of 07/16/19 (from the past 48 hour(s))  POC CBG, ED     Status: Abnormal   Collection Time: 07/16/19  3:38 AM  Result Value Ref Range   Glucose-Capillary 147 (H) 70 - 99 mg/dL  CBC with Differential     Status: Abnormal   Collection Time: 07/16/19  3:38 AM  Result Value Ref Range   WBC 10.5 4.0 - 10.5 K/uL   RBC 3.21 (L) 3.87 - 5.11 MIL/uL   Hemoglobin 9.6 (L) 12.0 - 15.0 g/dL   HCT 31.0 (L) 36.0 - 46.0 %   MCV 96.6 80.0 - 100.0 fL   MCH 29.9 26.0 - 34.0 pg   MCHC 31.0 30.0 - 36.0 g/dL   RDW 15.6 (H) 11.5 - 15.5 %   Platelets 231 150 - 400 K/uL   nRBC 0.0 0.0 - 0.2 %   Neutrophils Relative % 84 %   Neutro Abs 8.8 (H) 1.7 -  7.7 K/uL   Lymphocytes Relative 11 %   Lymphs Abs 1.2 0.7 - 4.0 K/uL   Monocytes Relative 5 %   Monocytes Absolute 0.5 0.1 - 1.0 K/uL   Eosinophils Relative 0 %   Eosinophils Absolute 0.0 0.0 - 0.5 K/uL   Basophils Relative 0 %   Basophils Absolute 0.0 0.0 - 0.1 K/uL   Immature Granulocytes 0 %   Abs Immature Granulocytes 0.04 0.00 - 0.07 K/uL    Comment: Performed at [facility], [place] [address], [place], [place] [address]  Comprehensive metabolic panel     Status: Abnormal   Collection Time: 07/16/19  3:38 AM  Result Value Ref Range   Sodium 140 135 - 145 mmol/L   Potassium 4.2 3.5 - 5.1 mmol/L   Chloride 104 98 - 111 mmol/L   CO2 22 22 - 32 mmol/L   Glucose, Bld 168 (H) 70 - 99 mg/dL   BUN 33 (H) 8 - 23 mg/dL   Creatinine, Ser 1.30 (H) 0.44 - 1.00 mg/dL   Calcium 10.4 (H) 8.9 - 10.3 mg/dL   Total Protein 7.2 6.5 - 8.1 g/dL   Albumin 3.6 3.5 - 5.0 g/dL   AST 18 15 - 41 U/L   ALT 12 0 - 44 U/L   Alkaline Phosphatase  127 (H) 38 - 126 U/L   Total Bilirubin 0.8 0.3 - 1.2 mg/dL   GFR calc non Af Amer 39 (L) >60 mL/min   GFR calc Af Amer 45 (L) >60 mL/min   Anion gap 14 5 - 15    Comment: Performed at [facility] [address], [place], [place] [address]  Troponin I (High Sensitivity)     Status: None   Collection Time: 07/16/19  3:38 AM  Result Value Ref Range   Troponin I (High Sensitivity) 9 <18 ng/L    Comment: (NOTE) Elevated high sensitivity troponin I (hsTnI) values and significant  changes across serial measurements may suggest ACS but many other  chronic and acute conditions are known to elevate hsTnI results.  Refer to the "Links" section for chest pain algorithms and additional  guidance. Performed at [facility], [place] [address], [place], [place] [address]   Magnesium     Status: None   Collection Time: 07/16/19  3:38 AM  Result Value Ref Range   Magnesium 1.9 1.7 - 2.4 mg/dL    Comment: Performed at [facility] [address], [place], [place] [address]  Troponin I (High Sensitivity)     Status: None   Collection Time: 07/16/19  6:27 AM  Result Value Ref Range   Troponin I (High Sensitivity) 11 <18 ng/L    Comment: (NOTE) Elevated high sensitivity troponin I (hsTnI) values and significant  changes across serial measurements may suggest ACS but many other  chronic and acute conditions are known to elevate hsTnI results.  Refer to the "Links" section for chest pain algorithms and additional  guidance. Performed at [facility], [place] [address] [place], [place] [address]     Dg Elbow 2 Views Left  Result Date: 07/16/2019 CLINICAL DATA:  Fall EXAM: LEFT ELBOW - 2 VIEW COMPARISON:  None. FINDINGS: There is no evidence of fracture, dislocation, or joint effusion. There is no evidence of arthropathy or other focal bone abnormality. Soft tissues are unremarkable. IMPRESSION: Negative. Electronically Signed   By: Donavan Foil M.D.   On: 07/16/2019 03:58    Ct Head Wo  Contrast  Result Date: 07/16/2019 CLINICAL DATA:  Posttraumatic headache EXAM: CT HEAD WITHOUT CONTRAST CT CERVICAL SPINE WITHOUT CONTRAST TECHNIQUE: Multidetector CT imaging of the head and cervical spine was performed following the standard protocol without intravenous contrast. Multiplanar CT image reconstructions of the cervical spine were also generated. COMPARISON:  None. FINDINGS: CT HEAD FINDINGS Brain: 3 mm high-density subdural collection along the lateral left frontal convexity, especially convincing on coronal reformats. No parenchymal hemorrhage or swelling. No hydrocephalus or masslike finding. Vascular: No hyperdense vessel or unexpected calcification. Skull: Left anterior scalp hematoma without calvarial fracture. Sinuses/Orbits: No evidence of injury. Bilateral cataract resection and partial left mastoid opacification. CT CERVICAL SPINE FINDINGS Alignment: Degenerative 3 mm anterolisthesis at C4-5. Skull base and vertebrae: Negative for acute fracture. Soft tissues and spinal canal: No prevertebral fluid or swelling. No visible canal hematoma. Disc levels: Advanced lower cervical degenerative disc narrowing with endplate ridging especially at C5-6. Upper chest: Negative Critical Value/emergent results were called by telephone at the time of interpretation on 07/16/2019 at 4:15 am to [place] , who verbally acknowledged these results. IMPRESSION: 1. Trace acute subdural hematoma along the left frontal convexity, only 3 mm in thickness. 2. Left frontal scalp hematoma without calvarial fracture. 3. Negative for cervical spine fracture. Electronically Signed   By: Monte Fantasia M.D.   On: 07/16/2019 04:15   Ct Cervical Spine Wo Contrast  Result Date: 07/16/2019 CLINICAL DATA:  Posttraumatic headache EXAM: CT HEAD WITHOUT CONTRAST CT CERVICAL SPINE WITHOUT CONTRAST TECHNIQUE: Multidetector CT imaging of the head and cervical spine was performed following the standard  protocol without intravenous contrast. Multiplanar CT image reconstructions of the cervical spine were also generated. COMPARISON:  None. FINDINGS: CT HEAD FINDINGS Brain: 3 mm high-density subdural collection along the lateral left frontal convexity, especially convincing on coronal reformats. No parenchymal hemorrhage or swelling. No hydrocephalus or masslike finding. Vascular: No hyperdense vessel or unexpected calcification. Skull: Left anterior scalp hematoma without calvarial fracture. Sinuses/Orbits: No evidence of injury. Bilateral cataract resection and partial left mastoid opacification. CT CERVICAL SPINE FINDINGS Alignment: Degenerative 3 mm anterolisthesis at C4-5. Skull base and vertebrae: Negative for acute fracture. Soft tissues and spinal canal: No prevertebral fluid or swelling. No visible canal hematoma. Disc levels: Advanced lower cervical degenerative disc narrowing with endplate ridging especially at C5-6. Upper chest: Negative Critical Value/emergent results were called by telephone at the time of interpretation on 07/16/2019 at 4:15 am to [place] , who verbally acknowledged these results. IMPRESSION: 1. Trace acute subdural hematoma along the left frontal convexity, only 3 mm in thickness. 2. Left frontal scalp hematoma without calvarial fracture. 3. Negative for cervical spine fracture. Electronically Signed   By: Monte Fantasia M.D.   On: 07/16/2019 04:15   Dg Chest Portable 1 View  Result Date: 07/16/2019 CLINICAL DATA:  Fall EXAM: PORTABLE CHEST 1 VIEW COMPARISON:  02/24/2018 FINDINGS: No focal airspace disease or effusion. Cardiomediastinal silhouette is within normal limits. Aortic atherosclerosis. No pneumothorax. Acute displaced fracture involving the proximal left humerus. IMPRESSION: No active disease. Acute displaced fracture involving the proximal left humerus. Electronically Signed   By: Donavan Foil M.D.   On: 07/16/2019 03:57   Dg Shoulder Left  Portable  Result Date: 07/16/2019 CLINICAL DATA:  Fall EXAM: LEFT SHOULDER - 1 VIEW COMPARISON:  None. FINDINGS: Acute fracture involving the left humeral neck with about 1/2 bone wid anterior displacement distal fracture fragment and mild valgus angulation of distal fracture fragment. Humeral head does not appear  dislocated. IMPRESSION: Acute displaced and angulated humeral neck fracture Electronically Signed   By: Donavan Foil M.D.   On: 07/16/2019 03:57    Review of Systems  Constitutional: Negative for weight loss.  HENT: Negative for ear discharge, ear pain, hearing loss and tinnitus.   Eyes: Negative for blurred vision, double vision, photophobia and pain.  Respiratory: Negative for cough, sputum production and shortness of breath.   Cardiovascular: Negative for chest pain.  Gastrointestinal: Negative for abdominal pain, nausea and vomiting.  Genitourinary: Negative for dysuria, flank pain, frequency and urgency.  Musculoskeletal: Positive for joint pain (Left shoulder). Negative for back pain, falls, myalgias and neck pain.  Neurological: Negative for dizziness, tingling, sensory change, focal weakness, loss of consciousness and headaches.  Endo/Heme/Allergies: Does not bruise/bleed easily.  Psychiatric/Behavioral: Negative for depression, memory loss and substance abuse. The patient is not nervous/anxious.    Blood pressure (!) 170/87, pulse (!) 105, temperature 97.9 F (36.6 C), temperature source Oral, resp. rate 19, SpO2 97 %. Physical Exam  Constitutional: She appears well-developed and well-nourished. No distress.  HENT:  Head: Normocephalic.  Left forehead contusion  Eyes: Conjunctivae are normal. Right eye exhibits no discharge. Left eye exhibits no discharge. No scleral icterus.  Neck: Normal range of motion.  Cardiovascular: Normal rate and regular rhythm.  Respiratory: Effort normal. No respiratory distress.  Musculoskeletal:     Comments: Left shoulder, elbow, wrist,  digits- no skin wounds, shoulder mod TTP, no instability, no blocks to motion  Sens  Ax/R/M/U intact  Mot   Ax/ R/ PIN/ M/ AIN/ U intact  Rad 2+  Neurological: She is alert.  Skin: Skin is warm and dry. She is not diaphoretic.  Psychiatric: She has a normal mood and affect. Her behavior is normal.    Assessment/Plan: Left humerus fx -- No intervention planned for today given anticoagulant status and medical history in addition to acute TBI. Will get CT scan of shoulder tomorrow while she undergoes repeat head CT and make her NPO after MN in case operative intervention warranted. NWB in sling for now. TBI w/SHD Multiple medical problems including coronary artery disease, chronic diastolic CHF, DVT on Xarelto, frequent PVCs, and sleep apnea -- per primary service    Lisette Abu, PA-C Orthopedic Surgery [phone] 07/16/2019, 9:10 AM

## 2019-07-16 NOTE — ED Notes (Signed)
Pt's dinner tray arrived. 

## 2019-07-16 NOTE — ED Provider Notes (Addendum)
TIME SEEN: 3:40 AM  CHIEF COMPLAINT: fall  HPI: Patient is an 78 year old female with history of previous left-sided breast cancer, DVT on Xarelto, hypertension, hyperlipidemia, previous MI status post 5 stents on Plavix who presents to the emergency department with EMS after she fell at home.  States she is unsure why she fell.  States when she has had confusion in the past she had a UTI and is concerned she may have another UTI today.  No chest pain or shortness of breath.  No dizziness.  No numbness or focal weakness.  EMS reports intermittent runs of ventricular tachycardia.  States the longest episode lasted approximately 7 seconds with no loss of consciousness or pulses in route.  Patient denies fevers, cough, vomiting, diarrhea.  No dysuria, hematuria, urinary frequency or urgency.  No flank pain.  Complaining of left upper extremity pain.  She is right-hand dominant.  ROS: See HPI Constitutional: no fever  Eyes: no drainage  ENT: no runny nose   Cardiovascular:  no chest pain  Resp: no SOB  GI: no vomiting GU: no dysuria Integumentary: no rash  Allergy: no hives  Musculoskeletal: no leg swelling  Neurological: no slurred speech ROS otherwise negative  PAST MEDICAL HISTORY/PAST SURGICAL HISTORY:  Past Medical History:  Diagnosis Date  . Arthritis   . Asthma without status asthmaticus    unspecified  . Breast cancer ([place])   . Coronary artery disease   . Diverticulosis   . DVT (deep venous thrombosis) ([place])   . Edema   . Gout   . Hyperlipidemia   . Hypertension   . MI, old   . Myocardial infarct ([place]) 2004  . Sleep apnea     MEDICATIONS:  Prior to Admission medications   Medication Sig Start Date End Date Taking? Authorizing Provider  allopurinol (ZYLOPRIM) 300 MG tablet Take 300 mg by mouth daily.    [provider]  Cholecalciferol (VITAMIN D3) 50 MCG (2000 UT) capsule Take by mouth. 11/12/18   [provider]  clopidogrel (PLAVIX) 75 MG tablet Take  1 tablet (75 mg total) by mouth daily with breakfast. 02/12/18   Corey Skains, MD  colchicine 0.6 MG tablet Take by mouth. 10/06/18   [provider]  diltiazem (CARDIZEM CD) 120 MG 24 hr capsule Take 1 capsule (120 mg total) by mouth daily. 02/27/18   Hillary Bow, MD  furosemide (LASIX) 40 MG tablet Take 40 mg by mouth daily.    [provider]  isosorbide mononitrate (IMDUR) 30 MG 24 hr tablet Take 30 mg by mouth daily. 01/14/18   [provider]  metoprolol succinate (TOPROL XL) 25 MG 24 hr tablet Take 1 tablet (25 mg total) by mouth daily. Patient not taking: Reported on 12/15/2018 02/26/18   Hillary Bow, MD  Naphazoline-Pheniramine (ALLERGY EYE OP) Place 1 drop into both eyes daily as needed (for dry eyes).    [provider]  nitroGLYCERIN (NITROSTAT) 0.4 MG SL tablet Place 0.4 mg under the tongue every 5 (five) minutes as needed for chest pain.  12/31/17   [provider]  nystatin (MYCOSTATIN/NYSTOP) powder Apply 1 g topically daily as needed. areas or skinfolds for yeast 01/30/18   [provider]  pantoprazole (PROTONIX) 40 MG tablet Take 40 mg by mouth daily.  12/31/17   [provider]  potassium chloride (K-DUR) 10 MEQ tablet  12/03/18   [provider]  rivaroxaban (XARELTO) 20 MG TABS tablet Take 1 tablet (20 mg total) by  mouth daily. 02/27/18   Hillary Bow, MD  simvastatin (ZOCOR) 10 MG tablet Take 10 mg by mouth daily. HLD- reduced d/t interaction with Diltiazem- increased r/f Rhabdomyelosis    [provider]  spironolactone (ALDACTONE) 25 MG tablet Take 25 mg by mouth daily.  01/31/18   [provider]  white petrolatum (VASELINE) GEL Apply 1 application topically as needed (for eyes and face).    [provider]    ALLERGIES:  Allergies  Allergen Reactions  . Benzocaine Other (See Comments)    Unknown  . Lisinopril Cough  . Neomycin Other (See Comments)    On allergy testing   . Sulfamethoxazole-Trimethoprim Other (See Comments)    Unknown  . Sulfa Antibiotics Rash    SOCIAL HISTORY:  Social History   Tobacco Use  . Smoking status: Never Smoker  . Smokeless tobacco: Never Used  Substance Use Topics  . Alcohol use: No    FAMILY HISTORY: Family History  Problem Relation Age of Onset  . Angina Mother   . Hypertension Mother   . Lung cancer Mother   . Hypertension Father   . Heart attack Father   . Nephrolithiasis Sister   . Hypertension Other        sibling    EXAM: BP (!) 180/114   Pulse (!) 107   Temp 98 F (36.7 C) (Oral)   Resp 18   SpO2 97%  CONSTITUTIONAL: Alert and oriented to person place and time and responds appropriately to questions but does appear intermittently confused. Well-appearing; well-nourished; GCS 15, elderly, no distress HEAD: Normocephalic; bruising and hematoma to the left forehead EYES: Conjunctivae clear, PERRL, EOMI ENT: normal nose; no rhinorrhea; moist mucous membranes; pharynx without lesions noted; no dental injury; no septal hematoma NECK: Supple, no meningismus, no LAD; no midline spinal tenderness, step-off or deformity; trachea midline CARD: Regular and tachycardic; S1 and S2 appreciated; no murmurs, no clicks, no rubs, no gallops RESP: Normal chest excursion without splinting or tachypnea; breath sounds clear and equal bilaterally; no wheezes, no rhonchi, no rales; no hypoxia or respiratory distress CHEST:  chest wall stable, no crepitus or ecchymosis or deformity, nontender to palpation; no flail chest ABD/GI: Normal bowel sounds; non-distended; soft, non-tender, no rebound, no guarding; no ecchymosis or other lesions noted PELVIS:  stable, nontender to palpation BACK:  The back appears normal and is non-tender to palpation, there is no CVA tenderness; no midline spinal tenderness, step-off or deformity EXT: Tender to palpation over the proximal left humerus with swelling and ecchymosis.  Also tender over  the left shoulder and left elbow.  No obvious signs of left shoulder dislocation.  No tenderness of the left forearm, wrist or hand.  2+ left radial pulse and normal grip strength.  Normal sensation in all extremities.  Compartments soft.  Otherwise no bony tenderness or traumatic injury on exam. SKIN: Normal color for age and race; warm NEURO: Moves all extremities equally, normal sensation diffusely, cranial nerves II through XII intact, normal speech PSYCH: The patient's mood and manner are appropriate. Grooming and personal hygiene are appropriate.  MEDICAL DECISION MAKING: Patient here with unwitnessed fall.  She is unsure why she fell.  Having intermittent runs of ventricular tachycardia with EMS.  Rhythm strip shows a 4 beat run of V. tach.  Currently in a sinus tachycardia without chest pain or shortness of breath.  Will check electrolytes today.  We will also check cardiac labs.  Will obtain CT of her head, cervical spine  x-rays of the left upper extremity.  Will give fentanyl for pain control.  Will check urinalysis today for UTI.  ED PROGRESS: CT imaging shows a trace acute subdural hematoma along the left frontal convexity approximately 3 mm in thickness without midline shift.  She has a frontal scalp hematoma without fracture.  Will discuss with neurosurgery.  Anticipate admission for observation given patient is on antiplatelets as well as anticoagulants.    C-spine shows no acute injury.  C-collar removed.     X-rays of the left upper extremity show an acute displaced and angulated humeral neck fracture.  Will place in a sling.  She is right-hand dominant.  She does not have a local orthopedist.  Will discuss with orthopedics on-call.  Pain is well controlled after IV fentanyl.  Patient continues to have intermittent bigeminy and trigeminy.  Have not witnessed any further runs of ventricular tachycardia.  Potassium and magnesium levels are normal.  Troponin negative.  Chest x-ray shows  normal cardiac silhouette.  Will admit for further cardiac monitoring.  Daughter at bedside is concerned that this could have caused her fall tonight.  5:34 AM Discussed with Dr. Doran Durand on call of orthopedic surgery.  Appreciate his help.  Patient will be seen later today in consultation by orthopedics.  Sling has been ordered.  Discussed with nurse to place on patient in ED.  5:50 AM Discussed patient's case with hospitalist, Dr. Myna Hidalgo.  I have recommended admission and patient (and family if present) agree with this plan. Admitting physician will place admission orders.   I reviewed all nursing notes, vitals, pertinent previous records, EKGs, lab and urine results, imaging (as available).  Neurosurgery has been paged 3 times since 4:16 AM.  Hospitalist aware that we have not heard back from neurosurgery for their recommendations.  We will continue to attempt to get in touch with neurosurgery on call.  6:10 AM  Spoke with Margo Aye, NP on-call for neurosurgery.  They will see patient in consultation.  Patient has no change in her neurologic status.  No focal neurologic deficits on exam.    Date: 07/16/2019 3:38 AM  Rate: 108  Rhythm: Sinus tachycardia  QRS Axis: normal  Intervals: normal  ST/T Wave abnormalities: normal  Conduction Disutrbances: none  Narrative Interpretation: Sinus tachycardia, paired PVCs, significant artifact        Melinda Maxwell was evaluated in Emergency Department on 07/16/2019 for the symptoms described in the history of present illness. She was evaluated in the context of the global COVID-19 pandemic, which necessitated consideration that the patient might be at risk for infection with the SARS-CoV-2 virus that causes COVID-19. Institutional protocols and algorithms that pertain to the evaluation of patients at risk for COVID-19 are in a state of rapid change based on information released by regulatory bodies including the CDC and federal and state  organizations. These policies and algorithms were followed during the patient's care in the ED.     CRITICAL CARE Performed by: Pryor Curia   Total critical care time: 45 minutes  Critical care time was exclusive of separately billable procedures and treating other patients.  Critical care was necessary to treat or prevent imminent or life-threatening deterioration.  Critical care was time spent personally by me on the following activities: development of treatment plan with patient and/or surrogate as well as nursing, discussions with consultants, evaluation of patient's response to treatment, examination of patient, obtaining history from patient or surrogate, ordering and performing treatments and interventions, ordering  and review of laboratory studies, ordering and review of radiographic studies, pulse oximetry and re-evaluation of patient's condition.     Jadah Bobak, Delice Bison, DO 07/16/19 [phone]

## 2019-07-16 NOTE — Consult Note (Signed)
Reason for Consult:SDH Referring Physician: EDP  Melinda Maxwell is an 78 y.o. female.   HPI:  78 year old female with a history of DVT, atrial fibrillation, and recent coronary artery stenting who fell and struck the left side of her head.  She has mild headache.  No visual changes.  She complains mostly of left shoulder pain in that shoulder is in a splint.  No numbness tingling or weakness.  She is on Xarelto and Plavix.  CT scan showed a trace left subdural hematoma and neurosurgical evaluation was requested  Past Medical History:  Diagnosis Date  . Arthritis   . Asthma without status asthmaticus    unspecified  . Breast cancer ([place])   . Coronary artery disease   . Diverticulosis   . DVT (deep venous thrombosis) ([place])   . Edema   . Gout   . Hyperlipidemia   . Hypertension   . MI, old   . Myocardial infarct ([place]) 2004  . Sleep apnea     Past Surgical History:  Procedure Laterality Date  . ABDOMINAL HYSTERECTOMY    . APPENDECTOMY    . BREAST LUMPECTOMY     L breast  . CATARACT EXTRACTION    . COLONOSCOPY     08/27/1989, 01/03/1998, 06/09/2004  . CORONARY STENT INTERVENTION N/A 02/11/2018   Procedure: CORONARY STENT INTERVENTION;  Surgeon: Nelva Bush, MD;  Location: [facility];  Service: Cardiovascular;  Laterality: N/A;  . ESOPHAGOGASTRODUODENOSCOPY  06/09/2004  . FLEXIBLE SIGMOIDOSCOPY    . HEMICOLECTOMY    . LEFT HEART CATH AND CORONARY ANGIOGRAPHY Left 02/11/2018   Procedure: LEFT HEART CATH AND CORONARY ANGIOGRAPHY;  Surgeon: Corey Skains, MD;  Location: [facility];  Service: Cardiovascular;  Laterality: Left;  . ORIF ANKLE FRACTURE  2004    Allergies  Allergen Reactions  . Benzocaine Other (See Comments)    Unknown  . Lisinopril Cough  . Neomycin Other (See Comments)    On allergy testing  . Sulfamethoxazole-Trimethoprim Other (See Comments)    Unknown  . Sulfa Antibiotics Rash    Social History   Tobacco Use  . Smoking  status: Never Smoker  . Smokeless tobacco: Never Used  Substance Use Topics  . Alcohol use: No    Family History  Problem Relation Age of Onset  . Angina Mother   . Hypertension Mother   . Lung cancer Mother   . Hypertension Father   . Heart attack Father   . Nephrolithiasis Sister   . Hypertension Other        sibling     Review of Systems  Positive ROS: neg  All other systems have been reviewed and were otherwise negative with the exception of those mentioned in the HPI and as above.  Objective: Vital signs in last 24 hours: Temp:  [97.9 F (36.6 C)-98 F (36.7 C)] 97.9 F (36.6 C) (10/08 0650) Pulse Rate:  [51-107] 72 (10/08 0730) Resp:  [15-23] 16 (10/08 0730) BP: (136-180)/(58-114) 143/70 (10/08 0730) SpO2:  [94 %-97 %] 96 % (10/08 0730)  General Appearance: Alert, cooperative, no distress, appears stated age Head: Normocephalic, left periorbital and frontal ecchymosis and swelling Eyes: PERRL, conjunctiva/corneas clear, EOM's intact Throat: benign Neck: Supple Lungs:  respirations unlabored Heart: Irregular rhythm Abdomen: Soft Extremities: Left upper extremity in a sling Pulses: 2+ and symmetric all extremities Skin: Skin color, texture, turgor normal, no rashes or lesions  NEUROLOGIC:   Mental status: A&O x4, no aphasia, good attention span,  Memory and fund of knowledge appear to be appropriate Motor Exam - grossly normal, normal tone and bulk except unable to test left arm Sensory Exam - grossly normal Reflexes: symmetric Coordination - grossly normal Gait -unable to test Balance -unable to test Cranial Nerves: I: smell Not tested  II: visual acuity  OS: na    OD: na  II: visual fields Full to confrontation  II: pupils Equal, round, reactive to light  III,VII: ptosis None  III,IV,VI: extraocular muscles  Full ROM  V: mastication Normal  V: facial light touch sensation  Normal  V,VII: corneal reflex  Present  VII: facial muscle function - upper   Normal  VII: facial muscle function - lower Normal  VIII: hearing Not tested  IX: soft palate elevation  Normal  IX,X: gag reflex Present  XI: trapezius strength  5/5  XI: sternocleidomastoid strength 5/5  XI: neck flexion strength  5/5  XII: tongue strength  Normal    Data Review Lab Results  Component Value Date   WBC 10.5 07/16/2019   HGB 9.6 (L) 07/16/2019   HCT 31.0 (L) 07/16/2019   MCV 96.6 07/16/2019   PLT 231 07/16/2019   Lab Results  Component Value Date   NA 140 07/16/2019   K 4.2 07/16/2019   CL 104 07/16/2019   CO2 22 07/16/2019   BUN 33 (H) 07/16/2019   CREATININE 1.30 (H) 07/16/2019   GLUCOSE 168 (H) 07/16/2019   Lab Results  Component Value Date   INR 1.24 02/20/2018    Radiology: Dg Elbow 2 Views Left  Result Date: 07/16/2019 CLINICAL DATA:  Fall EXAM: LEFT ELBOW - 2 VIEW COMPARISON:  None. FINDINGS: There is no evidence of fracture, dislocation, or joint effusion. There is no evidence of arthropathy or other focal bone abnormality. Soft tissues are unremarkable. IMPRESSION: Negative. Electronically Signed   By: Donavan Foil M.D.   On: 07/16/2019 03:58   Ct Head Wo Contrast  Result Date: 07/16/2019 CLINICAL DATA:  Posttraumatic headache EXAM: CT HEAD WITHOUT CONTRAST CT CERVICAL SPINE WITHOUT CONTRAST TECHNIQUE: Multidetector CT imaging of the head and cervical spine was performed following the standard protocol without intravenous contrast. Multiplanar CT image reconstructions of the cervical spine were also generated. COMPARISON:  None. FINDINGS: CT HEAD FINDINGS Brain: 3 mm high-density subdural collection along the lateral left frontal convexity, especially convincing on coronal reformats. No parenchymal hemorrhage or swelling. No hydrocephalus or masslike finding. Vascular: No hyperdense vessel or unexpected calcification. Skull: Left anterior scalp hematoma without calvarial fracture. Sinuses/Orbits: No evidence of injury. Bilateral cataract resection  and partial left mastoid opacification. CT CERVICAL SPINE FINDINGS Alignment: Degenerative 3 mm anterolisthesis at C4-5. Skull base and vertebrae: Negative for acute fracture. Soft tissues and spinal canal: No prevertebral fluid or swelling. No visible canal hematoma. Disc levels: Advanced lower cervical degenerative disc narrowing with endplate ridging especially at C5-6. Upper chest: Negative Critical Value/emergent results were called by telephone at the time of interpretation on 07/16/2019 at 4:15 am to [place] , who verbally acknowledged these results. IMPRESSION: 1. Trace acute subdural hematoma along the left frontal convexity, only 3 mm in thickness. 2. Left frontal scalp hematoma without calvarial fracture. 3. Negative for cervical spine fracture. Electronically Signed   By: Monte Fantasia M.D.   On: 07/16/2019 04:15   Ct Cervical Spine Wo Contrast  Result Date: 07/16/2019 CLINICAL DATA:  Posttraumatic headache EXAM: CT HEAD WITHOUT CONTRAST CT CERVICAL SPINE WITHOUT CONTRAST TECHNIQUE: Multidetector CT imaging of the head  and cervical spine was performed following the standard protocol without intravenous contrast. Multiplanar CT image reconstructions of the cervical spine were also generated. COMPARISON:  None. FINDINGS: CT HEAD FINDINGS Brain: 3 mm high-density subdural collection along the lateral left frontal convexity, especially convincing on coronal reformats. No parenchymal hemorrhage or swelling. No hydrocephalus or masslike finding. Vascular: No hyperdense vessel or unexpected calcification. Skull: Left anterior scalp hematoma without calvarial fracture. Sinuses/Orbits: No evidence of injury. Bilateral cataract resection and partial left mastoid opacification. CT CERVICAL SPINE FINDINGS Alignment: Degenerative 3 mm anterolisthesis at C4-5. Skull base and vertebrae: Negative for acute fracture. Soft tissues and spinal canal: No prevertebral fluid or swelling. No visible canal  hematoma. Disc levels: Advanced lower cervical degenerative disc narrowing with endplate ridging especially at C5-6. Upper chest: Negative Critical Value/emergent results were called by telephone at the time of interpretation on 07/16/2019 at 4:15 am to [place] , who verbally acknowledged these results. IMPRESSION: 1. Trace acute subdural hematoma along the left frontal convexity, only 3 mm in thickness. 2. Left frontal scalp hematoma without calvarial fracture. 3. Negative for cervical spine fracture. Electronically Signed   By: Monte Fantasia M.D.   On: 07/16/2019 04:15   Dg Chest Portable 1 View  Result Date: 07/16/2019 CLINICAL DATA:  Fall EXAM: PORTABLE CHEST 1 VIEW COMPARISON:  02/24/2018 FINDINGS: No focal airspace disease or effusion. Cardiomediastinal silhouette is within normal limits. Aortic atherosclerosis. No pneumothorax. Acute displaced fracture involving the proximal left humerus. IMPRESSION: No active disease. Acute displaced fracture involving the proximal left humerus. Electronically Signed   By: Donavan Foil M.D.   On: 07/16/2019 03:57   Dg Shoulder Left Portable  Result Date: 07/16/2019 CLINICAL DATA:  Fall EXAM: LEFT SHOULDER - 1 VIEW COMPARISON:  None. FINDINGS: Acute fracture involving the left humeral neck with about 1/2 bone wid anterior displacement distal fracture fragment and mild valgus angulation of distal fracture fragment. Humeral head does not appear dislocated. IMPRESSION: Acute displaced and angulated humeral neck fracture Electronically Signed   By: Donavan Foil M.D.   On: 07/16/2019 03:57     Assessment/Plan: Estimated body mass index is 31.45 kg/m as calculated from the following:   Height as of 12/15/18: 5' 1.1" (1.552 m).   Weight as of 12/15/18: 75.8 kg.   Difficult situation in an 78 year old female with a history of DVT, atrial fibrillation and recent coronary artery stenting who is on oral anticoagulants and antiplatelet agent and now has a  tiny left-sided subdural hematoma.  Obviously there is risk either way.  There is risk of cardiac event or stroke with discontinuance of the medications, but there is risk of worsening of subdural hematoma with continuing the medication.  Would stop the Xarelto and Plavix for 3 to 5 days, repeat CT scan tomorrow.  I am leaving town for 4 days and therefore I have checked this patient out to Dr. Zada Finders who has graciously accepted her care in my absence.   Eustace Moore 07/16/2019 8:04 AM

## 2019-07-16 NOTE — ED Notes (Signed)
Pt pulse ox is not accurate due to AFIB. Pulse Rate at this time is 77.

## 2019-07-16 NOTE — ED Notes (Addendum)
Daughter- Juliarose Weissenborn  [phone] Sister called to update on POC.  Pt alert and appropriate with details.

## 2019-07-16 NOTE — Progress Notes (Signed)
  PROGRESS NOTE  Patient admitted earlier this morning. See H&P by Dr. Myna Hidalgo.   Melinda Maxwell is an 78 y.o. female with medical history significant for coronary artery disease, chronic diastolic CHF, DVT on Xarelto, frequent PVCs, and sleep apnea, now presenting to the emergency department with headache and left arm pain after a fall and syncopal episode at home.  Patient reports that she was in her usual state of health last night, but fell after using the bathroom, believes that she lost consciousness, and was experiencing severe pain in the left arm and also had a headache when she regained awareness.  She was able to get to a phone and call her daughter who then called EMS.  Daughter provides additional history, reporting that within the past week, the patient was seated at a table to eat when she reported feeling as though she was going to pass out, and then did have a brief loss of consciousness, sliding out of the chair onto the floor.  Patient has not had any chest pain associated with these events.   Work up revealed acute displaced fracture of the proximal left humerus, noncontrast head CT reveals subdural hematoma along the left frontal convexity, 3 mm in thickness, and no acute cervical spine fracture is identified on CT. Neurosurgery and orthopedic surgery consulted.  Syncope -Syncopal episode x 2 in the past week, history of non-sustained VT, A fib  -Consult cardiology -Echocardiogram pending -Telemetry   Subdural hematoma -S/p fall -Neurosurgery consulted, plan for repeat CT tomorrow -Hold Xarelto, Plavix for 3 to 5 days -Neurochecks q4h   Displaced fracture of left humeral neck -S/p fall -Orthopedic surgery consulted, plan for CT shoulder tomorrow -Nonweightbearing and sling left upper extremity for now  Non-sustained VT -Patient with multiple PVCs on telemetry on my exam, noted NSVT by EMS en route -Consult cardiology   Paroxysmal atrial fibrillation -Continue Cardizem  -Hold Xarelto  CAD -S/p PCI of LAD in May 2019 -Follows with [name] in [place] [facility]   Chronic diastolic heart failure -Euvolemic, stable at this time -Continue Demadex  Chronic kidney disease stage III -Baseline creatinine 1.1-1.48 -Stable  History of DVT -Hold Xarelto   Left voicemail for daughter to discuss/answer questions.    Melinda Phi, DO [facility] 07/16/2019, 10:56 AM  Available via Epic secure chat 7am-7pm After these hours, please refer to coverage provider listed on [email or web address]

## 2019-07-16 NOTE — Progress Notes (Signed)
  Echocardiogram 2D Echocardiogram has been performed.  Bobbye Charleston 07/16/2019, 4:36 PM

## 2019-07-16 NOTE — ED Notes (Signed)
Pt calling out thinking that her sister is here to seee her, can hear voices outside her room.  Redirected quickly and is oriented to situation and time.  No other changes noted.  States she is pain free.  Pure wick in place yet she continues to be unable to urinate.   Instructions given.  Pleasant and talkative.

## 2019-07-16 NOTE — H&P (Signed)
History and Physical    Melinda Maxwell [id number] DOB: [birth year] DOA: 07/16/2019  PCP: [name], MD   Patient coming from: Home   Chief Complaint: Fall with LOC, headache, left arm pain   HPI: Melinda Maxwell is a 78 y.o. female with medical history significant for coronary artery disease, chronic diastolic CHF, DVT on Xarelto, frequent PVCs, and sleep apnea, now presenting to the emergency department with headache and left arm pain after a fall at home.  Patient reports that she was in her usual state of health last night, but fell after using the bathroom, believes that she lost consciousness, and was experiencing severe pain in the left arm and also had a headache when she regained awareness.  She was able to get to a phone and call her daughter who then called EMS.  Daughter provides additional history, reporting that within the past week, the patient was seated at a table to eat when she reported feeling as though she was going to pass out, and then did have a brief loss of consciousness, sliding out of the chair onto the floor.  Patient has not had any chest pain associated with these events, denies any recent fevers or chills, and denies any shortness of breath.  ED Course: Upon arrival to the ED, patient is found to be afebrile, saturating mid 90s on room air, slightly tachycardic, and with stable blood pressure.  EKG features sinus tachycardia with rate 108 and frequent PVCs.  Chemistry panel is notable for creatinine 1.30, similar to priors.  CBC features a normocytic anemia with hemoglobin 9.6.  High-sensitivity troponin is normal.  Chest x-ray is negative for acute cardiopulmonary disease.  Acute displaced fracture of the proximal left humerus noted on plain films.  Noncontrast head CT reveals subdural hematoma along the left frontal convexity, 3 mm in thickness, and no acute cervical spine fracture is identified on CT.  Patient was treated with fentanyl and Zofran in the ED.   Neurosurgery and orthopedic surgery was consulted by the ED physician.  COVID-19 testing has not yet resulted.  Review of Systems:  All other systems reviewed and apart from HPI, are negative.  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   Sleep apnea     Past Surgical History:  Procedure Laterality Date   ABDOMINAL HYSTERECTOMY     APPENDECTOMY     BREAST LUMPECTOMY     L breast   CATARACT EXTRACTION     COLONOSCOPY     08/27/1989, 01/03/1998, 06/09/2004   CORONARY STENT INTERVENTION N/A 02/11/2018   Procedure: CORONARY STENT INTERVENTION;  Surgeon: Nelva Bush, MD;  Location: [facility];  Service: Cardiovascular;  Laterality: N/A;   ESOPHAGOGASTRODUODENOSCOPY  06/09/2004   FLEXIBLE SIGMOIDOSCOPY     HEMICOLECTOMY     LEFT HEART CATH AND CORONARY ANGIOGRAPHY Left 02/11/2018   Procedure: LEFT HEART CATH AND CORONARY ANGIOGRAPHY;  Surgeon: Corey Skains, MD;  Location: [facility];  Service: Cardiovascular;  Laterality: Left;   ORIF ANKLE FRACTURE  2004     reports that she has never smoked. She has never  used smokeless tobacco. She reports that she does not drink alcohol or use drugs.  Allergies  Allergen Reactions   Benzocaine Other (See Comments)    Unknown   Lisinopril Cough   Neomycin Other (See Comments)    On allergy testing   Sulfamethoxazole-Trimethoprim Other (See Comments)    Unknown   Sulfa Antibiotics Rash    Family History  Problem Relation Age of Onset   Angina Mother    Hypertension Mother    Lung cancer Mother    Hypertension Father    Heart attack Father    Nephrolithiasis Sister    Hypertension Other        sibling      Prior to Admission medications   Medication Sig Start Date End Date Taking? Authorizing Provider  allopurinol (ZYLOPRIM) 300 MG tablet Take 300 mg by mouth daily.    [provider]  Cholecalciferol (VITAMIN D3) 50 MCG (2000 UT) capsule Take by mouth. 11/12/18   [provider]  clopidogrel (PLAVIX) 75 MG tablet Take 1 tablet (75 mg total) by mouth daily with breakfast. 02/12/18   Corey Skains, MD  colchicine 0.6 MG tablet Take by mouth. 10/06/18   [provider]  diltiazem (CARDIZEM CD) 120 MG 24 hr capsule Take 1 capsule (120 mg total) by mouth daily. 02/27/18   Hillary Bow, MD  furosemide (LASIX) 40 MG tablet Take 40 mg by mouth daily.    [provider]  isosorbide mononitrate (IMDUR) 30 MG 24 hr tablet Take 30 mg by mouth daily. 01/14/18   [provider]  metoprolol succinate (TOPROL XL) 25 MG 24 hr tablet Take 1 tablet (25 mg total) by mouth daily. Patient not taking: Reported on 12/15/2018 02/26/18   Hillary Bow, MD  Naphazoline-Pheniramine (ALLERGY EYE OP) Place 1 drop into both eyes daily as needed (for dry eyes).    [provider]  nitroGLYCERIN (NITROSTAT) 0.4 MG SL tablet Place 0.4 mg under the tongue every 5 (five) minutes as needed for chest pain.  12/31/17   [provider]  nystatin (MYCOSTATIN/NYSTOP) powder Apply 1 g topically daily as needed. areas or skinfolds for yeast 01/30/18   [provider]  pantoprazole (PROTONIX) 40 MG tablet Take 40 mg by mouth daily.  12/31/17   [provider]  potassium chloride (K-DUR) 10 MEQ tablet  12/03/18   [provider]  rivaroxaban (XARELTO) 20 MG TABS tablet Take 1 tablet (20 mg total) by mouth daily. 02/27/18   Hillary Bow, MD  simvastatin (ZOCOR) 10 MG tablet Take 10 mg by mouth daily. HLD- reduced d/t interaction with Diltiazem- increased r/f Rhabdomyelosis    [provider]  spironolactone (ALDACTONE) 25 MG tablet Take 25 mg by  mouth daily.  01/31/18   [provider]  white petrolatum (VASELINE) GEL Apply 1 application topically as needed (for eyes and face).    [provider]    Physical Exam: Vitals:   07/16/19 0430 07/16/19 0445 07/16/19 0500 07/16/19 0530  BP: (!) 170/82 (!) 171/60 (!) 166/76 (!) 165/72  Pulse: (!) 102 (!) 106 (!) 107 (!) 105  Resp: 15 15 17 16   Temp:      TempSrc:      SpO2: 94% 95% 94% 96%    Constitutional: NAD, calm  Eyes: PERTLA, lids and conjunctivae normal ENMT: Mucous membranes are moist. Posterior pharynx clear of any exudate or lesions.   Neck: normal, supple, no masses, no thyromegaly Respiratory: no wheezing, no crackles.  Normal respiratory effort. No accessory muscle use.  Cardiovascular: S1 & S2 heard, regular rate and rhythm. Mild lower leg swelling bilaterally. Abdomen: No distension, no tenderness, soft. Bowel sounds normal.  Musculoskeletal: no clubbing / cyanosis. Ecchymosis, edema, and tenderness involving left shoulder, neurovascularly intact distally.   Skin: left frontal scalp hematoma. Warm, dry, well-perfused. Neurologic: CN II-XII grossly intact. Mild dysarthria. Sensation intact. Strength 5/5 in all 4 limbs.  Psychiatric: Alert and oriented to person, place, and situation. Pleasant, cooperative.    Labs on Admission: I have personally reviewed following labs and imaging studies  CBC: Recent Labs  Lab 07/16/19 0338  WBC 10.5  NEUTROABS 8.8*  HGB 9.6*  HCT 31.0*  MCV 96.6  PLT [id number]   Basic Metabolic Panel: Recent Labs  Lab 07/16/19 0338  NA 140  K 4.2  CL 104  CO2 22  GLUCOSE 168*  BUN 33*  CREATININE 1.30*  CALCIUM 10.4*  MG 1.9   GFR: CrCl cannot be calculated (Unknown ideal weight.). Liver Function Tests: Recent Labs  Lab 07/16/19 0338  AST 18  ALT 12  ALKPHOS 127*  BILITOT 0.8  PROT 7.2  ALBUMIN 3.6   No results for input(s): LIPASE, AMYLASE in the last 168 hours. No results for input(s): AMMONIA in the  last 168 hours. Coagulation Profile: No results for input(s): INR, PROTIME in the last 168 hours. Cardiac Enzymes: No results for input(s): CKTOTAL, CKMB, CKMBINDEX, TROPONINI in the last 168 hours. BNP (last 3 results) No results for input(s): PROBNP in the last 8760 hours. HbA1C: No results for input(s): HGBA1C in the last 72 hours. CBG: Recent Labs  Lab 07/16/19 0338  GLUCAP 147*   Lipid Profile: No results for input(s): CHOL, HDL, LDLCALC, TRIG, CHOLHDL, LDLDIRECT in the last 72 hours. Thyroid Function Tests: No results for input(s): TSH, T4TOTAL, FREET4, T3FREE, THYROIDAB in the last 72 hours. Anemia Panel: No results for input(s): VITAMINB12, FOLATE, FERRITIN, TIBC, IRON, RETICCTPCT in the last 72 hours. Urine analysis:    Component Value Date/Time   COLORURINE YELLOW (A) 03/17/2018 1321   APPEARANCEUR CLEAR (A) 03/17/2018 1321   LABSPEC 1.010 03/17/2018 1321   PHURINE 5.0 03/17/2018 1321   GLUCOSEU NEGATIVE 03/17/2018 1321   HGBUR NEGATIVE 03/17/2018 1321   BILIRUBINUR NEGATIVE 03/17/2018 1321   KETONESUR NEGATIVE 03/17/2018 1321   PROTEINUR NEGATIVE 03/17/2018 1321   NITRITE NEGATIVE 03/17/2018 1321   LEUKOCYTESUR SMALL (A) 03/17/2018 1321   Sepsis Labs: @LABRCNTIP (procalcitonin:4,lacticidven:4) )No results found for this or any previous visit (from the past 240 hour(s)).   Radiological Exams on Admission: Dg Elbow 2 Views Left  Result Date: 07/16/2019 CLINICAL DATA:  Fall EXAM: LEFT ELBOW - 2 VIEW COMPARISON:  None. FINDINGS: There is no evidence of fracture, dislocation, or joint effusion. There is no evidence of arthropathy or other focal bone abnormality. Soft tissues are unremarkable. IMPRESSION: Negative. Electronically Signed   By: Donavan Foil M.D.   On: 07/16/2019 03:58   Ct Head Wo Contrast  Result Date: 07/16/2019 CLINICAL DATA:  Posttraumatic headache EXAM: CT HEAD WITHOUT CONTRAST CT CERVICAL SPINE WITHOUT CONTRAST TECHNIQUE: Multidetector CT  imaging of the head and cervical spine was performed following the standard protocol without intravenous contrast. Multiplanar CT image reconstructions of the cervical spine were also generated. COMPARISON:  None. FINDINGS: CT HEAD FINDINGS Brain: 3 mm high-density subdural collection along the lateral left frontal convexity, especially convincing on coronal reformats. No parenchymal hemorrhage or swelling. No hydrocephalus or masslike finding. Vascular: No hyperdense vessel  or unexpected calcification. Skull: Left anterior scalp hematoma without calvarial fracture. Sinuses/Orbits: No evidence of injury. Bilateral cataract resection and partial left mastoid opacification. CT CERVICAL SPINE FINDINGS Alignment: Degenerative 3 mm anterolisthesis at C4-5. Skull base and vertebrae: Negative for acute fracture. Soft tissues and spinal canal: No prevertebral fluid or swelling. No visible canal hematoma. Disc levels: Advanced lower cervical degenerative disc narrowing with endplate ridging especially at C5-6. Upper chest: Negative Critical Value/emergent results were called by telephone at the time of interpretation on 07/16/2019 at 4:15 am to [place] , who verbally acknowledged these results. IMPRESSION: 1. Trace acute subdural hematoma along the left frontal convexity, only 3 mm in thickness. 2. Left frontal scalp hematoma without calvarial fracture. 3. Negative for cervical spine fracture. Electronically Signed   By: Monte Fantasia M.D.   On: 07/16/2019 04:15   Ct Cervical Spine Wo Contrast  Result Date: 07/16/2019 CLINICAL DATA:  Posttraumatic headache EXAM: CT HEAD WITHOUT CONTRAST CT CERVICAL SPINE WITHOUT CONTRAST TECHNIQUE: Multidetector CT imaging of the head and cervical spine was performed following the standard protocol without intravenous contrast. Multiplanar CT image reconstructions of the cervical spine were also generated. COMPARISON:  None. FINDINGS: CT HEAD FINDINGS Brain: 3 mm  high-density subdural collection along the lateral left frontal convexity, especially convincing on coronal reformats. No parenchymal hemorrhage or swelling. No hydrocephalus or masslike finding. Vascular: No hyperdense vessel or unexpected calcification. Skull: Left anterior scalp hematoma without calvarial fracture. Sinuses/Orbits: No evidence of injury. Bilateral cataract resection and partial left mastoid opacification. CT CERVICAL SPINE FINDINGS Alignment: Degenerative 3 mm anterolisthesis at C4-5. Skull base and vertebrae: Negative for acute fracture. Soft tissues and spinal canal: No prevertebral fluid or swelling. No visible canal hematoma. Disc levels: Advanced lower cervical degenerative disc narrowing with endplate ridging especially at C5-6. Upper chest: Negative Critical Value/emergent results were called by telephone at the time of interpretation on 07/16/2019 at 4:15 am to [employer] , who verbally acknowledged these results. IMPRESSION: 1. Trace acute subdural hematoma along the left frontal convexity, only 3 mm in thickness. 2. Left frontal scalp hematoma without calvarial fracture. 3. Negative for cervical spine fracture. Electronically Signed   By: Monte Fantasia M.D.   On: 07/16/2019 04:15   Dg Chest Portable 1 View  Result Date: 07/16/2019 CLINICAL DATA:  Fall EXAM: PORTABLE CHEST 1 VIEW COMPARISON:  02/24/2018 FINDINGS: No focal airspace disease or effusion. Cardiomediastinal silhouette is within normal limits. Aortic atherosclerosis. No pneumothorax. Acute displaced fracture involving the proximal left humerus. IMPRESSION: No active disease. Acute displaced fracture involving the proximal left humerus. Electronically Signed   By: Donavan Foil M.D.   On: 07/16/2019 03:57   Dg Shoulder Left Portable  Result Date: 07/16/2019 CLINICAL DATA:  Fall EXAM: LEFT SHOULDER - 1 VIEW COMPARISON:  None. FINDINGS: Acute fracture involving the left humeral neck with about 1/2 bone wid  anterior displacement distal fracture fragment and mild valgus angulation of distal fracture fragment. Humeral head does not appear dislocated. IMPRESSION: Acute displaced and angulated humeral neck fracture Electronically Signed   By: Donavan Foil M.D.   On: 07/16/2019 03:57    EKG: Independently reviewed. Sinus tachycardia (rate 108), PVC's.   Assessment/Plan   1. Subdural hematoma  - Presents after a fall with brief LOC, reports headache, and is found to have acute SDH in left frontal convexity, only 3 mm thick  - Neurosurgery consulted by ED physician and much appreciated  - Hold Xarelto and Plavix, continue neuro checks, follow-up  neurosurgery recommendations    2. Displaced fracture of left humeral neck  - Orthopedic surgery is consulting and much appreciated  - Continue immobilization, pain-control   3. Non-sustained ventricular tachycardia  - Noted by EMS en route, frequent PVC's on monitor in ED  - She has seen cardiology for this, managed medically, continue diltiazem    4. CAD - No anginal complaints  - Hold Plavix as above, continue statin    5. Chronic diastolic CHF - Appears compensated  - Hold diuretics while NPO, SLIV, monitor daily wts    6. CKD stage III  - SCr is 1.30 on admission, similar to priors  - Renally-dose medications, monitor    7. History of DVT  - Hold Xarelto as above    8. Syncope  - Patient presents after a fall with LOC, does not know how she fell or if she lost consciousness prior to the fall or after hitting her head, but also had an episode of syncope while seated a few days earlier  - She has frequent PVC's on cardiac monitor in ED, but this appears to be chronic  - Continue cardiac monitoring, check echocardiogram    PPE: Mask, face shield  DVT prophylaxis: Xarelto pta, held on admission Code Status: Full  Family Communication: Daughter updated at bedside Consults called: Neurosurgery and orthopedic surgery consulted by ED  physician  Admission status: Observation     Vianne Bulls, MD [facility] Pager [phone]  If 7PM-7AM, please contact night-coverage [email or web address] Password [email or web address]  07/16/2019, 6:01 AM

## 2019-07-16 NOTE — ED Notes (Signed)
Reports a minimal HA at a 3.  Remains alert and oriented. Pain localized to shoulder.

## 2019-07-16 NOTE — Consult Note (Addendum)
Cardiology Consultation:   Patient ID: [name]; [id number]; Sep 03, 1941   Admit date: 07/16/2019 Date of Consult: 07/16/2019  Primary Care Provider: [name], MD Primary Cardiologist: [facility] Primary Electrophysiologist:  N/A   Patient Profile:   Melinda Maxwell is a 78 y.o. 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  History of Present Illness:   Melinda Maxwell was examined and evaluated at bedside this PM in the ED. She was observed eating lunch and resting comfortably. She states she has difficulty with her memory but was able to provide history. She was in her usual state of health until late last night as she was getting ready to go to bed, she felt herself feeling light-headed while ambulating and felt herself 'go backwards.' Next thing she remembers is being down and unable to get up. She called her daughter who called EMS. She denies any chest pain, palpitation, fevers, chills, dyspnea or aura associated with this event. She denies any tongue biting or urinary or stool incontinence.   On chart review, she has had known history of PVCs and is followed by [facility] in [place]. She is on monotherapy with oral diltiazem. She had a stress test done in 01/2019 with no perfusion defects. She also had 30 day holter monitor at the time which showed PVCs but no recurrence of her A.fib. LHC from 2019 showed multivessel disease which were stented. She is currently on Xarelto for anticoagulation and mono-antiplatelet therapy with clopidogrel.  Attempted to call daughter for further history taking but went to voicemail.  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   Sleep apnea     Past Surgical History:  Procedure Laterality Date   ABDOMINAL HYSTERECTOMY     APPENDECTOMY     BREAST LUMPECTOMY     L breast   CATARACT EXTRACTION     COLONOSCOPY     08/27/1989, 01/03/1998, 06/09/2004   CORONARY STENT INTERVENTION N/A 02/11/2018   Procedure: CORONARY STENT INTERVENTION;  Surgeon: Nelva Bush, MD;  Location: [place] [facility];  Service: Cardiovascular;  Laterality: N/A;   ESOPHAGOGASTRODUODENOSCOPY  06/09/2004   FLEXIBLE SIGMOIDOSCOPY     HEMICOLECTOMY     LEFT HEART CATH AND CORONARY ANGIOGRAPHY Left 02/11/2018   Procedure: LEFT HEART CATH AND CORONARY ANGIOGRAPHY;  Surgeon: Corey Skains, MD;  Location: [place] [facility];  Service: Cardiovascular;  Laterality: Left;   ORIF ANKLE FRACTURE  2004     Home Medications:  Prior to Admission medications   Medication Sig Start Date End Date Taking? Authorizing Provider  allopurinol (ZYLOPRIM) 300 MG tablet Take 300 mg by mouth daily.   Yes [provider]  Cholecalciferol (VITAMIN D3) 50 MCG (2000 UT) capsule Take by mouth. 11/12/18  Yes [provider]  clopidogrel (PLAVIX) 75 MG tablet Take 1 tablet (75 mg total) by mouth daily with breakfast. 02/12/18  Yes Corey Skains, MD  colchicine 0.6 MG tablet Take by mouth. 10/06/18  Yes [provider]  diltiazem (CARDIZEM) 30 MG tablet Take 30 mg by mouth 2 (two) times daily.   Yes [provider]  Naphazoline-Pheniramine (ALLERGY EYE OP) Place 1 drop into both eyes daily as needed (for dry eyes).   Yes [provider]  nitroGLYCERIN (NITROSTAT) 0.4 MG SL tablet Place 0.4 mg under the tongue every 5 (five) minutes as needed for chest pain.  12/31/17  Yes [provider]  nystatin (MYCOSTATIN/NYSTOP) powder Apply 1 g topically daily as needed. areas or skinfolds for yeast 01/30/18   Yes [provider]  pantoprazole (PROTONIX) 40 MG tablet Take 40 mg by mouth daily.  12/31/17  Yes [provider]  potassium chloride (K-DUR) 10 MEQ tablet Take 10 mEq by mouth daily.  12/03/18  Yes [provider]  rivaroxaban (XARELTO) 20 MG TABS tablet Take 1 tablet (20 mg total) by mouth daily. 02/27/18  Yes Sudini, Alveta Heimlich, MD  simvastatin (ZOCOR) 10 MG tablet Take 10 mg by mouth daily. HLD- reduced d/t interaction with Diltiazem- increased r/f Rhabdomyelosis   Yes [provider]  torsemide (DEMADEX) 20 MG tablet Take 20 mg by mouth 2 (two) times daily.   Yes [provider]    Inpatient Medications: Scheduled Meds:  diltiazem  30 mg Oral BID   simvastatin  10 mg Oral q1800   sodium chloride flush  3 mL Intravenous Q12H   sodium chloride flush  3 mL Intravenous Q12H   torsemide  20 mg Oral BID   Continuous Infusions:  sodium chloride     PRN Meds: sodium chloride, acetaminophen **OR** acetaminophen, fentaNYL (SUBLIMAZE) injection, labetalol, ondansetron **OR** ondansetron (ZOFRAN) IV, polyethylene glycol, sodium chloride flush  Allergies:    Allergies  Allergen Reactions   Benzocaine Other (See Comments)    Unknown   Lisinopril Cough   Neomycin Other (See Comments)    On allergy testing   Sulfamethoxazole-Trimethoprim Other (See Comments)    Unknown   Sulfa Antibiotics Rash    Social History:   Social History   Socioeconomic History   Marital status: Divorced    Spouse name: Not on file   Number of children: 1   Years of education: 12   Highest education level: High school graduate  Occupational History   Not on file  Social Needs   Financial resource strain: Not on file   Food insecurity    Worry: Not on file    Inability: Not on file   Transportation needs    Medical: Not on file    Non-medical: Not on file  Tobacco Use   Smoking status: Never Smoker   Smokeless tobacco: Never Used    Substance and Sexual Activity   Alcohol use: No   Drug use: Never   Sexual activity: Not on file  Lifestyle   Physical activity    Days per week: Not on file    Minutes per session: Not on file   Stress: Not on file  Relationships   Social connections    Talks on phone: Not on file    Gets together: Not on file    Attends religious service: Not on file    Active member of club or organization: Not on file    Attends meetings of clubs or organizations: Not on file    Relationship status: Not on file   Intimate partner violence    Fear of current or ex partner: Not on file    Emotionally abused: Not on file  Physically abused: Not on file    Forced sexual activity: Not on file  Other Topics Concern   Not on file  Social History Narrative   Not on file    Family History:    Family History  Problem Relation Age of Onset   Angina Mother    Hypertension Mother    Lung cancer Mother    Hypertension Father    Heart attack Father    Nephrolithiasis Sister    Hypertension Other        sibling     ROS:  Please see the history of present illness.  Review of Systems  Constitution: Negative for chills, decreased appetite and fever.  Eyes: Negative for blurred vision.  Cardiovascular: Positive for leg swelling. Negative for chest pain and palpitations.  Respiratory: Negative for shortness of breath.   Skin: Negative for flushing.  Musculoskeletal: Positive for falls and joint pain.  Genitourinary: Negative for dysuria.       Physical Exam/Data:   Vitals:   07/16/19 1030 07/16/19 1100 07/16/19 1130 07/16/19 1300  BP: 128/69 140/89 (!) 155/91 (!) 141/71  Pulse: 96 96 99   Resp: (!) 26 (!) 26 (!) 28 15  Temp:      TempSrc:      SpO2: 90% 94% 96%    No intake or output data in the 24 hours ending 07/16/19 1430 There were no vitals filed for this visit. There is no height or weight on file to calculate BMI.   Gen: Well-developed, well nourished,  NAD HEENT: EOMi, intact hearing, small ecchymosis on Left temple, Dry mucous membranes Neck: supple, ROM intact, no JVD CV: Irregular, S1, S2 normal, No rubs, no murmurs, no gallops Pulm: CTAB, No rales, no wheezes Abd: Soft, BS+, NTND, No rebound, no guarding Extm: Left arm in sling, Peripheral pulses intact, trace edema of bilateral lower extremities Skin: Dry, Warm, poor skin turgor, Neuro: AAOx3, Cranial Nerve II-XII intact, DTR intact Psych: Normal mood and affect  EKG:  The EKG was personally reviewed and demonstrates:  Ventricular trigeminy w/o ST changes. Telemetry:  Telemetry was personally reviewed and demonstrates:  Frequent PVCs.  Relevant CV Studies:  NM Stress Test 01/28/19 Gated LV Analysis:  TID Ratio: 1.1  LVEF= 89%  FINDINGS: Regional wall motion: reveals normal myocardial thickening and wall  motion. The overall quality of the study is good.  Artifacts noted: no Left ventricular cavity: normal.  Perfusion Analysis: SPECT images demonstrate homogeneous tracer  distribution throughout the myocardium.   Laboratory Data:  Chemistry Recent Labs  Lab 07/16/19 0338  NA 140  K 4.2  CL 104  CO2 22  GLUCOSE 168*  BUN 33*  CREATININE 1.30*  CALCIUM 10.4*  GFRNONAA 39*  GFRAA 45*  ANIONGAP 14    Recent Labs  Lab 07/16/19 0338  PROT 7.2  ALBUMIN 3.6  AST 18  ALT 12  ALKPHOS 127*  BILITOT 0.8   Hematology Recent Labs  Lab 07/16/19 0338  WBC 10.5  RBC 3.21*  HGB 9.6*  HCT 31.0*  MCV 96.6  MCH 29.9  MCHC 31.0  RDW 15.6*  PLT 231   Cardiac EnzymesNo results for input(s): TROPONINI in the last 168 hours. No results for input(s): TROPIPOC in the last 168 hours.  BNPNo results for input(s): BNP, PROBNP in the last 168 hours.  DDimer No results for input(s): DDIMER in the last 168 hours.  Radiology/Studies:  Dg Elbow 2 Views Left  Result Date: 07/16/2019 CLINICAL DATA:  Fall  EXAM: LEFT ELBOW - 2 VIEW COMPARISON:  None. FINDINGS: There  is no evidence of fracture, dislocation, or joint effusion. There is no evidence of arthropathy or other focal bone abnormality. Soft tissues are unremarkable. IMPRESSION: Negative. Electronically Signed   By: Donavan Foil M.D.   On: 07/16/2019 03:58   Ct Head Wo Contrast  Result Date: 07/16/2019 CLINICAL DATA:  Posttraumatic headache EXAM: CT HEAD WITHOUT CONTRAST CT CERVICAL SPINE WITHOUT CONTRAST TECHNIQUE: Multidetector CT imaging of the head and cervical spine was performed following the standard protocol without intravenous contrast. Multiplanar CT image reconstructions of the cervical spine were also generated. COMPARISON:  None. FINDINGS: CT HEAD FINDINGS Brain: 3 mm high-density subdural collection along the lateral left frontal convexity, especially convincing on coronal reformats. No parenchymal hemorrhage or swelling. No hydrocephalus or masslike finding. Vascular: No hyperdense vessel or unexpected calcification. Skull: Left anterior scalp hematoma without calvarial fracture. Sinuses/Orbits: No evidence of injury. Bilateral cataract resection and partial left mastoid opacification. CT CERVICAL SPINE FINDINGS Alignment: Degenerative 3 mm anterolisthesis at C4-5. Skull base and vertebrae: Negative for acute fracture. Soft tissues and spinal canal: No prevertebral fluid or swelling. No visible canal hematoma. Disc levels: Advanced lower cervical degenerative disc narrowing with endplate ridging especially at C5-6. Upper chest: Negative Critical Value/emergent results were called by telephone at the time of interpretation on 07/16/2019 at 4:15 am to [place] , who verbally acknowledged these results. IMPRESSION: 1. Trace acute subdural hematoma along the left frontal convexity, only 3 mm in thickness. 2. Left frontal scalp hematoma without calvarial fracture. 3. Negative for cervical spine fracture. Electronically Signed   By: Monte Fantasia M.D.   On: 07/16/2019 04:15   Ct Cervical Spine  Wo Contrast  Result Date: 07/16/2019 CLINICAL DATA:  Posttraumatic headache EXAM: CT HEAD WITHOUT CONTRAST CT CERVICAL SPINE WITHOUT CONTRAST TECHNIQUE: Multidetector CT imaging of the head and cervical spine was performed following the standard protocol without intravenous contrast. Multiplanar CT image reconstructions of the cervical spine were also generated. COMPARISON:  None. FINDINGS: CT HEAD FINDINGS Brain: 3 mm high-density subdural collection along the lateral left frontal convexity, especially convincing on coronal reformats. No parenchymal hemorrhage or swelling. No hydrocephalus or masslike finding. Vascular: No hyperdense vessel or unexpected calcification. Skull: Left anterior scalp hematoma without calvarial fracture. Sinuses/Orbits: No evidence of injury. Bilateral cataract resection and partial left mastoid opacification. CT CERVICAL SPINE FINDINGS Alignment: Degenerative 3 mm anterolisthesis at C4-5. Skull base and vertebrae: Negative for acute fracture. Soft tissues and spinal canal: No prevertebral fluid or swelling. No visible canal hematoma. Disc levels: Advanced lower cervical degenerative disc narrowing with endplate ridging especially at C5-6. Upper chest: Negative Critical Value/emergent results were called by telephone at the time of interpretation on 07/16/2019 at 4:15 am to [place] , who verbally acknowledged these results. IMPRESSION: 1. Trace acute subdural hematoma along the left frontal convexity, only 3 mm in thickness. 2. Left frontal scalp hematoma without calvarial fracture. 3. Negative for cervical spine fracture. Electronically Signed   By: Monte Fantasia M.D.   On: 07/16/2019 04:15   Dg Chest Portable 1 View  Result Date: 07/16/2019 CLINICAL DATA:  Fall EXAM: PORTABLE CHEST 1 VIEW COMPARISON:  02/24/2018 FINDINGS: No focal airspace disease or effusion. Cardiomediastinal silhouette is within normal limits. Aortic atherosclerosis. No pneumothorax. Acute  displaced fracture involving the proximal left humerus. IMPRESSION: No active disease. Acute displaced fracture involving the proximal left humerus. Electronically Signed   By: Madie Reno.D.  On: 07/16/2019 03:57   Dg Shoulder Left Portable  Result Date: 07/16/2019 CLINICAL DATA:  Fall EXAM: LEFT SHOULDER - 1 VIEW COMPARISON:  None. FINDINGS: Acute fracture involving the left humeral neck with about 1/2 bone wid anterior displacement distal fracture fragment and mild valgus angulation of distal fracture fragment. Humeral head does not appear dislocated. IMPRESSION: Acute displaced and angulated humeral neck fracture Electronically Signed   By: Donavan Foil M.D.   On: 07/16/2019 03:57    Assessment and Plan:   Syncopal Episode Ventricular Trigeminy Hx of A.fib Present w/ syncopal episode causing fall. EKG w/ ventricular trigeminy. Per chart review, hx of multiple palpitations and near syncopal episodes. Likely multi-factorial including arrhythmia + vasovagal + hypotension?. Prior Echo in 2019 w/ moderate mitral & tricuspid valve regurg. Need echo to r/o worsening valvular disease. Currently on monotherapy w/ CCB at home. Also increased diuresis recently with torsemide from furosemide. Likely would have long term benefit w/ ablation. - C/w telemetry - Orthostatic vitals - F/u TTE - C/w home med: diltiazem 30mg  BID - Once other etiologies ruled out, can consider catheter ablation vs medical therapy - currently vitals stable, can f/u with outpatient card - Holding anticoagulation due to hematoma, expected surgery - Replete K / Mag as apporpriate  Chronic diastolic heart failure Stress test on 01/2019 showing EF >60%. Weight appears at baseline. Exam hypovolemic.  - Hold torsemide in setting of trauma - Monitor I&Os - Daily weights  Coronary Artery Disease s/p Stents in RAD + LADx2 Last cath in 2019 w/ intervention. On plavix at home. - Holding antiplatelet therapy insetting of  hematoma  Humoral Fracture Subdural Hematoma - Management per neurosurg , ortho  HLD - c/w home med: simavastatin 10mg  daily   For questions or updates, please contact [place] Please consult [email or web address] for contact info under Cardiology/STEMI.   Signed, Gilberto Better, MD PGY-2, [place] [facility] Pager: [phone] 07/16/2019 2:30 PM  Attending attestation to follow

## 2019-07-17 ENCOUNTER — Inpatient Hospital Stay (HOSPITAL_COMMUNITY): Payer: Medicare Other

## 2019-07-17 LAB — CBC
HCT: 27.7 % — ABNORMAL LOW (ref 36.0–46.0)
Hemoglobin: 8.9 g/dL — ABNORMAL LOW (ref 12.0–15.0)
MCH: 30.3 pg (ref 26.0–34.0)
MCHC: 32.1 g/dL (ref 30.0–36.0)
MCV: 94.2 fL (ref 80.0–100.0)
Platelets: 207 10*3/uL (ref 150–400)
RBC: 2.94 MIL/uL — ABNORMAL LOW (ref 3.87–5.11)
RDW: 15.6 % — ABNORMAL HIGH (ref 11.5–15.5)
WBC: 6.9 10*3/uL (ref 4.0–10.5)
nRBC: 0 % (ref 0.0–0.2)

## 2019-07-17 LAB — BASIC METABOLIC PANEL
Anion gap: 12 (ref 5–15)
BUN: 23 mg/dL (ref 8–23)
CO2: 25 mmol/L (ref 22–32)
Calcium: 9.7 mg/dL (ref 8.9–10.3)
Chloride: 103 mmol/L (ref 98–111)
Creatinine, Ser: 1.1 mg/dL — ABNORMAL HIGH (ref 0.44–1.00)
GFR calc Af Amer: 55 mL/min — ABNORMAL LOW (ref >60)
GFR calc non Af Amer: 47 mL/min — ABNORMAL LOW (ref >60)
Glucose, Bld: 128 mg/dL — ABNORMAL HIGH (ref 70–99)
Potassium: 3.8 mmol/L (ref 3.5–5.1)
Sodium: 140 mmol/L (ref 135–145)

## 2019-07-17 LAB — GLUCOSE, CAPILLARY: Glucose-Capillary: 116 mg/dL — ABNORMAL HIGH (ref 70–99)

## 2019-07-17 MED ORDER — LACTATED RINGERS IV SOLN
INTRAVENOUS | Status: DC
  Administered 2019-07-17: 15:00:00 via INTRAVENOUS

## 2019-07-17 NOTE — Progress Notes (Signed)
PROGRESS NOTE    [name]  [id number] DOB: 09-14-1941 DOA: 07/16/2019 PCP: Melinda Maxwell     Brief Narrative:  Melinda Illa Masseyis an 78 y.o.femalewith medical history significant forcoronary artery disease, chronic diastolic CHF, DVT on Xarelto, frequent PVCs, and sleep apnea, now presenting to the emergency department with headache and left arm pain after a fall and syncopal episode at home.Patient reports that she was in her usual state of health last night, but fell after using the bathroom, believes that she lost consciousness, and was experiencing severe pain in the left arm and also had a headache when she regained awareness. She was able to get to a phone and call her daughter who then called EMS. Daughter provides additional history, reporting that within the past week, the patient was seated at a table to eat when she reported feeling as though she was going to pass out, and then did have a brief loss of consciousness, sliding out of the chair onto the floor. Patient has not had any chest pain associated with these events.   Work up revealed acute displaced fracture of the proximal left humerus, noncontrast head CT reveals subdural hematoma along the left frontal convexity, 3 mm in thickness, and no acute cervical spine fracture is identified on CT. Neurosurgery and orthopedic surgery consulted.  New events last 24 hours / Subjective: Feeling well overall this morning.  Admits to some headache, which has improved since admission.  Also complains of pain in her left arm.  Denies any chest pain or shortness of breath, no nausea, vomiting or abdominal pain.  Assessment & Plan:   Principal Problem:   Subdural hematoma (HCC) Active Problems:   CAD (coronary artery disease)   Chronic deep vein thrombosis (DVT) (HCC)   Diabetes mellitus with stage 3 chronic kidney disease (HCC)   Closed displaced fracture of surgical neck of left humerus   Non-sustained  ventricular tachycardia (HCC)   Chronic diastolic CHF (congestive heart failure) (HCC)   CKD (chronic kidney disease), stage III   Syncope   Syncope -Syncopal episode x 2 in the past week, history of non-sustained VT, A fib  -Cardiology consulted; signed off 10/9 -Cardiology recommend aspirin 81 mg daily, Eliquis 5 mg twice daily starting in 5 to 7 days -Patient to follow-up with her outpatient cardiologist for outpatient implanted loop recorder  Subdural hematoma -S/p fall -Neurosurgery consulted -Repeat CT head with redistribution of trace left convexity subdural blood, now primarily along the left tentorial leaflet. No new hemorrhage. -Resume aspirin, anticoagulation in 5 days -Repeat CT head in 2 weeks to follow-up  Displaced fracture of left humeral neck -S/p fall -Orthopedic surgery consulted -CT shoulder revealed acute comminuted, impacted, displaced and angulated fracture involving the proximal shaft of the humerus with extension to the anatomical neck. No dislocation of the humeral head. -Nonweightbearing and sling left upper extremity for now -?  Surgery  Non-sustained VT -Patient with multiple PVCs on telemetry on my exam, noted NSVT by EMS en route -Cardiology recommends outpatient loop recorder placement by her primary cardiology  Paroxysmal atrial fibrillation -Continue Cardizem -Switch to Eliquis in 5 days  CAD -S/p PCI of LAD in May 2019 -Follows with Melinda Maxwell in [place] to low-dose aspirin in 5 days -Continue zocor   Chronic diastolic heart failure -Euvolemic, stable at this time -Continue Demadex  Chronic kidney disease stage III -Baseline creatinine 1.1-1.48 -Stable today  History of DVT -Switch to Eliquis in 5 days  DVT prophylaxis: SCDs Code Status: Full code Family Communication: Discussed with daughter over the phone Disposition Plan: Pending further orthopedic surgery plan, PT OT pending   Consultants:    Orthopedic surgery  Neurosurgery  Cardiology  Procedures:   None  Antimicrobials:  Anti-infectives (From admission, onward)   None        Objective: Vitals:   07/16/19 2315 07/17/19 0000 07/17/19 0300 07/17/19 0749  BP: (!) 157/56 (!) 138/47 (!) 134/56 (!) 159/74  Pulse:  81 70 85  Resp: (!) 21 20 14 20   Temp:  97.9 F (36.6 C) 97.7 F (36.5 C) 98.7 F (37.1 C)  TempSrc:  Oral Oral Oral  SpO2: 94% 97% 93% 99%  Weight:  73 kg    Height:  5\' 1"  (1.549 m)      Intake/Output Summary (Last 24 hours) at 07/17/2019 1116 Last data filed at 07/17/2019 0300 Gross per 24 hour  Intake --  Output 1200 ml  Net -1200 ml   Filed Weights   07/17/19 0000  Weight: 73 kg    Examination:  General exam: Appears calm and comfortable  Respiratory system: Clear to auscultation. Respiratory effort normal. No respiratory distress. No conversational dyspnea.  Cardiovascular system: S1 & S2 heard, irregular rhythm, normal sinus with PACs noted on telemetry. No murmurs. No pedal edema. Gastrointestinal system: Abdomen is nondistended, soft and nontender. Normal bowel sounds heard. Central nervous system: Alert and oriented. No focal neurological deficits. Speech clear.   Extremities: Left upper extremity in a sling Skin: No rashes, lesions or ulcers on exposed skin  Psychiatry: Judgement and insight appear normal. Mood & affect appropriate.   Data Reviewed: I have personally reviewed following labs and imaging studies  CBC: Recent Labs  Lab 07/16/19 0338 07/17/19 0426  WBC 10.5 6.9  NEUTROABS 8.8*  --   HGB 9.6* 8.9*  HCT 31.0* 27.7*  MCV 96.6 94.2  PLT 231 [id number]   Basic Metabolic Panel: Recent Labs  Lab 07/16/19 0338 07/17/19 0426  NA 140 140  K 4.2 3.8  CL 104 103  CO2 22 25  GLUCOSE 168* 128*  BUN 33* 23  CREATININE 1.30* 1.10*  CALCIUM 10.4* 9.7  MG 1.9  --    GFR: Estimated Creatinine Clearance: 37.3 mL/min (A) (by C-G formula based on SCr of 1.1 mg/dL  (H)). Liver Function Tests: Recent Labs  Lab 07/16/19 0338  AST 18  ALT 12  ALKPHOS 127*  BILITOT 0.8  PROT 7.2  ALBUMIN 3.6   No results for input(s): LIPASE, AMYLASE in the last 168 hours. No results for input(s): AMMONIA in the last 168 hours. Coagulation Profile: No results for input(s): INR, PROTIME in the last 168 hours. Cardiac Enzymes: No results for input(s): CKTOTAL, CKMB, CKMBINDEX, TROPONINI in the last 168 hours. BNP (last 3 results) No results for input(s): PROBNP in the last 8760 hours. HbA1C: No results for input(s): HGBA1C in the last 72 hours. CBG: Recent Labs  Lab 07/16/19 0338 07/16/19 0856 07/17/19 0600  GLUCAP 147* 120* 116*   Lipid Profile: No results for input(s): CHOL, HDL, LDLCALC, TRIG, CHOLHDL, LDLDIRECT in the last 72 hours. Thyroid Function Tests: No results for input(s): TSH, T4TOTAL, FREET4, T3FREE, THYROIDAB in the last 72 hours. Anemia Panel: No results for input(s): VITAMINB12, FOLATE, FERRITIN, TIBC, IRON, RETICCTPCT in the last 72 hours. Sepsis Labs: No results for input(s): PROCALCITON, LATICACIDVEN in the last 168 hours.  Recent Results (from the past 240 hour(s))  SARS CORONAVIRUS 2 (TAT 6-24  HRS) Nasopharyngeal Nasopharyngeal Swab     Status: None   Collection Time: 07/16/19  6:09 AM   Specimen: Nasopharyngeal Swab  Result Value Ref Range Status   SARS Coronavirus 2 NEGATIVE NEGATIVE Final    Comment: (NOTE) SARS-CoV-2 target nucleic acids are NOT DETECTED. The SARS-CoV-2 RNA is generally detectable in upper and lower respiratory specimens during the acute phase of infection. Negative results do not preclude SARS-CoV-2 infection, do not rule out co-infections with other pathogens, and should not be used as the sole basis for treatment or other patient management decisions. Negative results must be combined with clinical observations, patient history, and epidemiological information. The expected result is Negative. Fact  Sheet for Patients: SugarRoll.be Fact Sheet for Healthcare Providers: https://www.woods-mathews.com/ This test is not yet approved or cleared by the [place] FDA and  has been authorized for detection and/or diagnosis of SARS-CoV-2 by FDA under an Emergency Use Authorization (EUA). This EUA will remain  in effect (meaning this test can be used) for the duration of the COVID-19 declaration under Section 56 4(b)(1) of the Act, 21 U.S.C. section 360bbb-3(b)(1), unless the authorization is terminated or revoked sooner. Performed at [facility], [place] [address], [place], [place] [address]       Radiology Studies: Dg Elbow 2 Views Left  Result Date: 07/16/2019 CLINICAL DATA:  Fall EXAM: LEFT ELBOW - 2 VIEW COMPARISON:  None. FINDINGS: There is no evidence of fracture, dislocation, or joint effusion. There is no evidence of arthropathy or other focal bone abnormality. Soft tissues are unremarkable. IMPRESSION: Negative. Electronically Signed   By: Donavan Foil M.D.   On: 07/16/2019 03:58   Ct Head Wo Contrast  Result Date: 07/17/2019 CLINICAL DATA:  Subdural hematoma follow-up EXAM: CT HEAD WITHOUT CONTRAST TECHNIQUE: Contiguous axial images were obtained from the base of the skull through the vertex without intravenous contrast. COMPARISON:  07/16/2019 CT head FINDINGS: Brain: Redistribution of trace left convexity subdural blood. No new hemorrhage. The blood is now primarily along the left tentorial leaflet. There is periventricular hypoattenuation compatible with chronic microvascular disease. Vascular: Atherosclerotic calcification of the vertebral and internal carotid arteries at the skull base. No abnormal hyperdensity of the major intracranial arteries or dural venous sinuses. Skull: Unchanged size of left frontoparietal scalp hematoma. No skull fracture. Sinuses/Orbits: No fluid levels or advanced mucosal thickening of the visualized  paranasal sinuses. No mastoid or middle ear effusion. The orbits are normal. IMPRESSION: 1. Redistribution of trace left convexity subdural blood, now primarily along the left tentorial leaflet. No new hemorrhage. 2. Unchanged size of left frontoparietal scalp hematoma. Electronically Signed   By: Ulyses Jarred M.D.   On: 07/17/2019 02:03   Ct Head Wo Contrast  Result Date: 07/16/2019 CLINICAL DATA:  Posttraumatic headache EXAM: CT HEAD WITHOUT CONTRAST CT CERVICAL SPINE WITHOUT CONTRAST TECHNIQUE: Multidetector CT imaging of the head and cervical spine was performed following the standard protocol without intravenous contrast. Multiplanar CT image reconstructions of the cervical spine were also generated. COMPARISON:  None. FINDINGS: CT HEAD FINDINGS Brain: 3 mm high-density subdural collection along the lateral left frontal convexity, especially convincing on coronal reformats. No parenchymal hemorrhage or swelling. No hydrocephalus or masslike finding. Vascular: No hyperdense vessel or unexpected calcification. Skull: Left anterior scalp hematoma without calvarial fracture. Sinuses/Orbits: No evidence of injury. Bilateral cataract resection and partial left mastoid opacification. CT CERVICAL SPINE FINDINGS Alignment: Degenerative 3 mm anterolisthesis at C4-5. Skull base and vertebrae: Negative for acute fracture. Soft tissues and  spinal canal: No prevertebral fluid or swelling. No visible canal hematoma. Disc levels: Advanced lower cervical degenerative disc narrowing with endplate ridging especially at C5-6. Upper chest: Negative Critical Value/emergent results were called by telephone at the time of interpretation on 07/16/2019 at 4:15 am to [place] , who verbally acknowledged these results. IMPRESSION: 1. Trace acute subdural hematoma along the left frontal convexity, only 3 mm in thickness. 2. Left frontal scalp hematoma without calvarial fracture. 3. Negative for cervical spine fracture.  Electronically Signed   By: Monte Fantasia M.D.   On: 07/16/2019 04:15   Ct Cervical Spine Wo Contrast  Result Date: 07/16/2019 CLINICAL DATA:  Posttraumatic headache EXAM: CT HEAD WITHOUT CONTRAST CT CERVICAL SPINE WITHOUT CONTRAST TECHNIQUE: Multidetector CT imaging of the head and cervical spine was performed following the standard protocol without intravenous contrast. Multiplanar CT image reconstructions of the cervical spine were also generated. COMPARISON:  None. FINDINGS: CT HEAD FINDINGS Brain: 3 mm high-density subdural collection along the lateral left frontal convexity, especially convincing on coronal reformats. No parenchymal hemorrhage or swelling. No hydrocephalus or masslike finding. Vascular: No hyperdense vessel or unexpected calcification. Skull: Left anterior scalp hematoma without calvarial fracture. Sinuses/Orbits: No evidence of injury. Bilateral cataract resection and partial left mastoid opacification. CT CERVICAL SPINE FINDINGS Alignment: Degenerative 3 mm anterolisthesis at C4-5. Skull base and vertebrae: Negative for acute fracture. Soft tissues and spinal canal: No prevertebral fluid or swelling. No visible canal hematoma. Disc levels: Advanced lower cervical degenerative disc narrowing with endplate ridging especially at C5-6. Upper chest: Negative Critical Value/emergent results were called by telephone at the time of interpretation on 07/16/2019 at 4:15 am to [place] , who verbally acknowledged these results. IMPRESSION: 1. Trace acute subdural hematoma along the left frontal convexity, only 3 mm in thickness. 2. Left frontal scalp hematoma without calvarial fracture. 3. Negative for cervical spine fracture. Electronically Signed   By: Monte Fantasia M.D.   On: 07/16/2019 04:15   Ct Shoulder Left Wo Contrast  Result Date: 07/17/2019 CLINICAL DATA:  Shoulder fracture EXAM: CT OF THE UPPER LEFT EXTREMITY WITHOUT CONTRAST TECHNIQUE: Multidetector CT imaging of the  upper left extremity was performed according to the standard protocol. COMPARISON:  Radiograph 07/16/2019 FINDINGS: Bones/Joint/Cartilage Left AC joint is intact. No clavicle fracture. Left upper ribs are intact. No scapular fracture. Acute comminuted fracture involving the proximal shaft of the humerus, and extending to the anatomical neck. About 1/2 bone width displacement of the distal fracture fragment toward the midline with mild to moderate valgus angulation of distal fracture fragment. Impaction of the outer cortex of the humerus shaft into the humeral neck by approximately 14 mm. 19 mm horizontally oriented bone fragment anterior to the humeral neck with smaller 7 mm cortical bone fragment posterior to the neck. No humeral head dislocation. No definite articular surface extension of fracture lucency. Ligaments Suboptimally assessed by CT. Muscles and Tendons No significant atrophy.  No intramuscular fluid collection. Soft tissues Moderate edema within the left axillary and shoulder soft tissues. IMPRESSION: 1. Acute comminuted, impacted, displaced and angulated fracture involving the proximal shaft of the humerus with extension to the anatomical neck. No dislocation of the humeral head. 2. Moderate soft tissue edema at the left shoulder. Electronically Signed   By: Donavan Foil M.D.   On: 07/17/2019 02:17   Dg Chest Portable 1 View  Result Date: 07/16/2019 CLINICAL DATA:  Fall EXAM: PORTABLE CHEST 1 VIEW COMPARISON:  02/24/2018 FINDINGS: No focal airspace disease or effusion.  Cardiomediastinal silhouette is within normal limits. Aortic atherosclerosis. No pneumothorax. Acute displaced fracture involving the proximal left humerus. IMPRESSION: No active disease. Acute displaced fracture involving the proximal left humerus. Electronically Signed   By: Donavan Foil M.D.   On: 07/16/2019 03:57   Dg Shoulder Left Portable  Result Date: 07/16/2019 CLINICAL DATA:  Fall EXAM: LEFT SHOULDER - 1 VIEW  COMPARISON:  None. FINDINGS: Acute fracture involving the left humeral neck with about 1/2 bone wid anterior displacement distal fracture fragment and mild valgus angulation of distal fracture fragment. Humeral head does not appear dislocated. IMPRESSION: Acute displaced and angulated humeral neck fracture Electronically Signed   By: Donavan Foil M.D.   On: 07/16/2019 03:57      Scheduled Meds:  diltiazem  30 mg Oral BID   simvastatin  10 mg Oral q1800   sodium chloride flush  3 mL Intravenous Q12H   sodium chloride flush  3 mL Intravenous Q12H   Continuous Infusions:  sodium chloride       LOS: 1 day      Time spent: 35 minutes   Dessa Phi, DO [facility] 07/17/2019, 11:16 AM   Available via Epic secure chat 7am-7pm After these hours, please refer to coverage provider listed on [email or web address]

## 2019-07-17 NOTE — Evaluation (Signed)
Physical Therapy Evaluation Patient Details Name: Melinda Maxwell MRN: [id number] DOB: [birth year] Today's Date: 07/17/2019   History of Present Illness  Melinda Maxwell is an 78 y.o. female with medical history significant for coronary artery disease, chronic diastolic CHF, DVT on Xarelto, frequent PVCs, and sleep apnea, now presenting to the emergency department  - pt with left subdural hematoma and left proximal humerus fracture.     Clinical Impression  Pt reluctantly agreed to OOB.  Pt needed +2 assist for safety with sit and stand - left sling on at all times and no increased pain.  pts daughter present for PT eval.  Pt orthostatic and dizzy in standing - laying 142/104, HR 76bpm;  Sitting 128/50, HR 84bpm;  Standing 111/45, HR 76bpm.  Pts daughter said this is not new for her.  Pt left in bed due to her dizziness.  Pt sitting and standing - weight posterior to her base.  Pt would benefit from SNF level therapy to recover. I anticipate it will be slow process.  Pt has had 3 falls in last month per daughter.  Acute PT will see pt while in hospital - to help improve her tolerance to upright position and increase activity safely.    Follow Up Recommendations SNF;Supervision/Assistance - 24 hour    Equipment Recommendations  None recommended by PT    Recommendations for Other Services       Precautions / Restrictions Precautions Precautions: Fall Precaution Comments: monitor BP - pt hypotensive Required Braces or Orthoses: Sling Restrictions Weight Bearing Restrictions: No LUE Weight Bearing: Non weight bearing Other Position/Activity Restrictions: pt waiting to see if she is going to need surgery to left UE      Mobility  Bed Mobility Overal bed mobility: Needs Assistance Bed Mobility: Supine to Sit;Sit to Supine     Supine to sit: Mod assist;+2 for physical assistance Sit to supine: +2 for physical assistance;Max assist   General bed mobility comments: helped pt sit up - with  PT assisting with elevating trunk/supporting sling/arm and nursing helping pull pt to EOB on chuck.  pt did practice scooting posterior when ready to lay back down.  pt had max assist of 2 to lay back down - she was dizzy  Transfers Overall transfer level: Needs assistance Equipment used: 1 person hand held assist Transfers: Sit to/from [facility] Sit to Stand: Mod assist;From elevated surface         General transfer comment: pt stood with mod assist from elevated bed.  pts initial standing was posterior to her base - cues to come forward.  I tried to have pt take small steps to chair - she was unable to move feet - daughter said seh gets lke this -c ant move.  pts BP also low - unsure of cause.  We helped pt sit back on bed and decided to lay her back in teh bed.  Ambulation/Gait             General Gait Details: unable  Stairs            Wheelchair Mobility    Modified Rankin (Stroke Patients Only)       Balance Overall balance assessment: Needs assistance   Sitting balance-Leahy Scale: Poor Sitting balance - Comments: pt falling posterior when sitting EOB - when give cues she could pull herself forward but then would go backward again     Standing balance-Leahy Scale: Zero Standing balance comment: pt needing me to help  her stay standing (to check her orthostatic BP).  pt wanting to go backwards - pt reports this is what she does                             Pertinent Vitals/Pain Pain Assessment: 0-10 Pain Score: 6  Pain Location: left arm Pain Descriptors / Indicators: Aching;Guarding(no increased pain after sitting up.  Pt to keep arm in sling at all times) Pain Intervention(s): Monitored during session;Premedicated before session;Repositioned    Home Living Family/patient expects to be discharged to:: Skilled nursing facility                 Additional Comments: Pt was living alone. she has had 3 falls this month.     Prior Function Level of Independence: Independent with assistive device(s)         Comments: daughter reports pt uses cane when up.  has RW.  3 falls in last month.     Hand Dominance        Extremity/Trunk Assessment   Upper Extremity Assessment Upper Extremity Assessment: (left arm in sling entire session - no movement or weight through left arm. pt and family and nursing aware.)    Lower Extremity Assessment Lower Extremity Assessment: Generalized weakness    Cervical / Trunk Assessment Cervical / Trunk Assessment: Normal  Communication   Communication: No difficulties  Cognition Arousal/Alertness: Awake/alert Behavior During Therapy: WFL for tasks assessed/performed Overall Cognitive Status: Within Functional Limits for tasks assessed                                 General Comments: pt hving moments of confusion - from pain meds??  for example - pt was telling the nurse story of when the nurse went to the trash bin      General Comments General comments (skin integrity, edema, etc.): Pts BP - supine 142/104, HR 76bpm;  sitting 128/50, HR 84bpm;  standing 111/45, HR 76bpm.  Pt dizzy and said room spinning - helped pt back to bed.  nursing aware    Exercises     Assessment/Plan    PT Assessment Patient needs continued PT services  PT Problem List Decreased strength;Decreased mobility;Decreased safety awareness;Decreased range of motion;Decreased knowledge of precautions;Decreased activity tolerance;Decreased cognition;Cardiopulmonary status limiting activity;Decreased skin integrity;Decreased balance;Decreased knowledge of use of DME;Pain       PT Treatment Interventions DME instruction;Therapeutic activities;Cognitive remediation;Gait training;Therapeutic exercise;Patient/family education;Balance training;Functional mobility training    PT Goals (Current goals can be found in the Care Plan section)  Acute Rehab PT Goals Patient Stated Goal: to  get better and stop falling PT Goal Formulation: With patient/family Time For Goal Achievement: 07/31/19 Potential to Achieve Goals: Good    Frequency Min 3X/week   Barriers to discharge Decreased caregiver support      Co-evaluation               AM-PAC PT "6 Clicks" Mobility  Outcome Measure Help needed turning from your back to your side while in a flat bed without using bedrails?: A Lot Help needed moving from lying on your back to sitting on the side of a flat bed without using bedrails?: A Lot Help needed moving to and from a bed to a chair (including a wheelchair)?: Total Help needed standing up from a chair using your arms (e.g., wheelchair or bedside chair)?: Total Help needed  to walk in hospital room?: Total Help needed climbing 3-5 steps with a railing? : Total 6 Click Score: 8    End of Session Equipment Utilized During Treatment: (left sling.) Activity Tolerance: Other (comment)(limited by orthostatic hypotension and dizziness) Patient left: in bed;with call bell/phone within reach;with nursing/sitter in room Nurse Communication: Mobility status;Precautions;Weight bearing status PT Visit Diagnosis: Repeated falls (R29.6);History of falling (Z91.81);Muscle weakness (generalized) (M62.81);Pain;Dizziness and giddiness (R42);Difficulty in walking, not elsewhere classified (R26.2) Pain - Right/Left: Left Pain - part of body: Arm    Time: 1325-1400 PT Time Calculation (min) (ACUTE ONLY): 35 min   Charges:   PT Evaluation $PT Eval Moderate Complexity: 1 Mod PT Treatments $Therapeutic Activity: 23-37 mins       07/17/2019   Rande Lawman, PT   Loyal Buba 07/17/2019, 2:23 PM

## 2019-07-17 NOTE — Progress Notes (Signed)
Pt admitted from ED S/P fall and SDH, pt alert and oriented, clo of pain in left shoulder with a rating of 5, left shoulder in a sling, settled in bed with call light within pt's reach, tele monitor put and verified on pt, was however reassured and will continue to monitor, v/s stable, safety precautions initiated. [place], Shayan Bramhall Efe

## 2019-07-17 NOTE — Progress Notes (Signed)
Progress Note  Patient Name: Melinda Maxwell Date of Encounter: 07/17/2019  Primary Cardiologist: No primary care provider on file.   Subjective  O/N events:  Repeat CT head/shoulder showing redistribution of subdural hematoma. CT shoulder showing displaced humeral fracture.  Melinda Maxwell was examined and evaluated at bedside this AM. She was observed resting comfortably in bed. She states she feels well w/o significant chest pain, palpitations, or dyspnea. She mentions she is awaiting for ortho to determine if she needs surgery. She has been NPO since midnight.  Inpatient Medications    Scheduled Meds:  diltiazem  30 mg Oral BID   simvastatin  10 mg Oral q1800   sodium chloride flush  3 mL Intravenous Q12H   sodium chloride flush  3 mL Intravenous Q12H   Continuous Infusions:  sodium chloride     PRN Meds: sodium chloride, acetaminophen **OR** acetaminophen, fentaNYL (SUBLIMAZE) injection, labetalol, ondansetron **OR** ondansetron (ZOFRAN) IV, polyethylene glycol, sodium chloride flush   Vital Signs    Vitals:   07/16/19 2315 07/17/19 0000 07/17/19 0300 07/17/19 0749  BP: (!) 157/56 (!) 138/47 (!) 134/56 (!) 159/74  Pulse:  81 70 85  Resp: (!) 21 20 14 20   Temp:  97.9 F (36.6 C) 97.7 F (36.5 C) 98.7 F (37.1 C)  TempSrc:  Oral Oral Oral  SpO2: 94% 97% 93% 99%  Weight:  73 kg    Height:  5\' 1"  (1.549 m)      Intake/Output Summary (Last 24 hours) at 07/17/2019 0947 Last data filed at 07/17/2019 0300 Gross per 24 hour  Intake --  Output 1200 ml  Net -1200 ml   Filed Weights   07/17/19 0000  Weight: 73 kg    Telemetry    Continues to have frequent PVC burden and ventricular bigeminy & trigeminy, HR stable at 70-80, no RVR - Personally Reviewed  ECG    Sinus tachycardia w/ PVCs, no ST changes - Personally Reviewed  Physical Exam   GEN: No acute distress.   HEENT: + Scalp hemotoma  Neck: No JVD Cardiac: Irregularly regular, 2/6 diastolic?  murmur Respiratory: Clear to auscultation bilaterally. GI: Soft, nontender, non-distended  MS: Trace pitting edema, Left arm in sling Neuro:  Nonfocal  Psych: Normal affect   Labs    Chemistry Recent Labs  Lab 07/16/19 0338 07/17/19 0426  NA 140 140  K 4.2 3.8  CL 104 103  CO2 22 25  GLUCOSE 168* 128*  BUN 33* 23  CREATININE 1.30* 1.10*  CALCIUM 10.4* 9.7  PROT 7.2  --   ALBUMIN 3.6  --   AST 18  --   ALT 12  --   ALKPHOS 127*  --   BILITOT 0.8  --   GFRNONAA 39* 47*  GFRAA 45* 55*  ANIONGAP 14 12     Hematology Recent Labs  Lab 07/16/19 0338 07/17/19 0426  WBC 10.5 6.9  RBC 3.21* 2.94*  HGB 9.6* 8.9*  HCT 31.0* 27.7*  MCV 96.6 94.2  MCH 29.9 30.3  MCHC 31.0 32.1  RDW 15.6* 15.6*  PLT 231 207    Cardiac EnzymesNo results for input(s): TROPONINI in the last 168 hours. No results for input(s): TROPIPOC in the last 168 hours.   BNPNo results for input(s): BNP, PROBNP in the last 168 hours.   DDimer No results for input(s): DDIMER in the last 168 hours.   Radiology    Dg Elbow 2 Views Left  Result Date: 07/16/2019 CLINICAL DATA:  Fall  EXAM: LEFT ELBOW - 2 VIEW COMPARISON:  None. FINDINGS: There is no evidence of fracture, dislocation, or joint effusion. There is no evidence of arthropathy or other focal bone abnormality. Soft tissues are unremarkable. IMPRESSION: Negative. Electronically Signed   By: Donavan Foil M.D.   On: 07/16/2019 03:58   Ct Head Wo Contrast  Result Date: 07/17/2019 CLINICAL DATA:  Subdural hematoma follow-up EXAM: CT HEAD WITHOUT CONTRAST TECHNIQUE: Contiguous axial images were obtained from the base of the skull through the vertex without intravenous contrast. COMPARISON:  07/16/2019 CT head FINDINGS: Brain: Redistribution of trace left convexity subdural blood. No new hemorrhage. The blood is now primarily along the left tentorial leaflet. There is periventricular hypoattenuation compatible with chronic microvascular disease.  Vascular: Atherosclerotic calcification of the vertebral and internal carotid arteries at the skull base. No abnormal hyperdensity of the major intracranial arteries or dural venous sinuses. Skull: Unchanged size of left frontoparietal scalp hematoma. No skull fracture. Sinuses/Orbits: No fluid levels or advanced mucosal thickening of the visualized paranasal sinuses. No mastoid or middle ear effusion. The orbits are normal. IMPRESSION: 1. Redistribution of trace left convexity subdural blood, now primarily along the left tentorial leaflet. No new hemorrhage. 2. Unchanged size of left frontoparietal scalp hematoma. Electronically Signed   By: Ulyses Jarred M.D.   On: 07/17/2019 02:03   Ct Head Wo Contrast  Result Date: 07/16/2019 CLINICAL DATA:  Posttraumatic headache EXAM: CT HEAD WITHOUT CONTRAST CT CERVICAL SPINE WITHOUT CONTRAST TECHNIQUE: Multidetector CT imaging of the head and cervical spine was performed following the standard protocol without intravenous contrast. Multiplanar CT image reconstructions of the cervical spine were also generated. COMPARISON:  None. FINDINGS: CT HEAD FINDINGS Brain: 3 mm high-density subdural collection along the lateral left frontal convexity, especially convincing on coronal reformats. No parenchymal hemorrhage or swelling. No hydrocephalus or masslike finding. Vascular: No hyperdense vessel or unexpected calcification. Skull: Left anterior scalp hematoma without calvarial fracture. Sinuses/Orbits: No evidence of injury. Bilateral cataract resection and partial left mastoid opacification. CT CERVICAL SPINE FINDINGS Alignment: Degenerative 3 mm anterolisthesis at C4-5. Skull base and vertebrae: Negative for acute fracture. Soft tissues and spinal canal: No prevertebral fluid or swelling. No visible canal hematoma. Disc levels: Advanced lower cervical degenerative disc narrowing with endplate ridging especially at C5-6. Upper chest: Negative Critical Value/emergent results  were called by telephone at the time of interpretation on 07/16/2019 at 4:15 am to [place] , who verbally acknowledged these results. IMPRESSION: 1. Trace acute subdural hematoma along the left frontal convexity, only 3 mm in thickness. 2. Left frontal scalp hematoma without calvarial fracture. 3. Negative for cervical spine fracture. Electronically Signed   By: Monte Fantasia M.D.   On: 07/16/2019 04:15   Ct Cervical Spine Wo Contrast  Result Date: 07/16/2019 CLINICAL DATA:  Posttraumatic headache EXAM: CT HEAD WITHOUT CONTRAST CT CERVICAL SPINE WITHOUT CONTRAST TECHNIQUE: Multidetector CT imaging of the head and cervical spine was performed following the standard protocol without intravenous contrast. Multiplanar CT image reconstructions of the cervical spine were also generated. COMPARISON:  None. FINDINGS: CT HEAD FINDINGS Brain: 3 mm high-density subdural collection along the lateral left frontal convexity, especially convincing on coronal reformats. No parenchymal hemorrhage or swelling. No hydrocephalus or masslike finding. Vascular: No hyperdense vessel or unexpected calcification. Skull: Left anterior scalp hematoma without calvarial fracture. Sinuses/Orbits: No evidence of injury. Bilateral cataract resection and partial left mastoid opacification. CT CERVICAL SPINE FINDINGS Alignment: Degenerative 3 mm anterolisthesis at C4-5. Skull base and vertebrae: Negative  for acute fracture. Soft tissues and spinal canal: No prevertebral fluid or swelling. No visible canal hematoma. Disc levels: Advanced lower cervical degenerative disc narrowing with endplate ridging especially at C5-6. Upper chest: Negative Critical Value/emergent results were called by telephone at the time of interpretation on 07/16/2019 at 4:15 am to [place] , who verbally acknowledged these results. IMPRESSION: 1. Trace acute subdural hematoma along the left frontal convexity, only 3 mm in thickness. 2. Left  frontal scalp hematoma without calvarial fracture. 3. Negative for cervical spine fracture. Electronically Signed   By: Monte Fantasia M.D.   On: 07/16/2019 04:15   Ct Shoulder Left Wo Contrast  Result Date: 07/17/2019 CLINICAL DATA:  Shoulder fracture EXAM: CT OF THE UPPER LEFT EXTREMITY WITHOUT CONTRAST TECHNIQUE: Multidetector CT imaging of the upper left extremity was performed according to the standard protocol. COMPARISON:  Radiograph 07/16/2019 FINDINGS: Bones/Joint/Cartilage Left AC joint is intact. No clavicle fracture. Left upper ribs are intact. No scapular fracture. Acute comminuted fracture involving the proximal shaft of the humerus, and extending to the anatomical neck. About 1/2 bone width displacement of the distal fracture fragment toward the midline with mild to moderate valgus angulation of distal fracture fragment. Impaction of the outer cortex of the humerus shaft into the humeral neck by approximately 14 mm. 19 mm horizontally oriented bone fragment anterior to the humeral neck with smaller 7 mm cortical bone fragment posterior to the neck. No humeral head dislocation. No definite articular surface extension of fracture lucency. Ligaments Suboptimally assessed by CT. Muscles and Tendons No significant atrophy.  No intramuscular fluid collection. Soft tissues Moderate edema within the left axillary and shoulder soft tissues. IMPRESSION: 1. Acute comminuted, impacted, displaced and angulated fracture involving the proximal shaft of the humerus with extension to the anatomical neck. No dislocation of the humeral head. 2. Moderate soft tissue edema at the left shoulder. Electronically Signed   By: Donavan Foil M.D.   On: 07/17/2019 02:17   Dg Chest Portable 1 View  Result Date: 07/16/2019 CLINICAL DATA:  Fall EXAM: PORTABLE CHEST 1 VIEW COMPARISON:  02/24/2018 FINDINGS: No focal airspace disease or effusion. Cardiomediastinal silhouette is within normal limits. Aortic atherosclerosis. No  pneumothorax. Acute displaced fracture involving the proximal left humerus. IMPRESSION: No active disease. Acute displaced fracture involving the proximal left humerus. Electronically Signed   By: Donavan Foil M.D.   On: 07/16/2019 03:57   Dg Shoulder Left Portable  Result Date: 07/16/2019 CLINICAL DATA:  Fall EXAM: LEFT SHOULDER - 1 VIEW COMPARISON:  None. FINDINGS: Acute fracture involving the left humeral neck with about 1/2 bone wid anterior displacement distal fracture fragment and mild valgus angulation of distal fracture fragment. Humeral head does not appear dislocated. IMPRESSION: Acute displaced and angulated humeral neck fracture Electronically Signed   By: Donavan Foil M.D.   On: 07/16/2019 03:57    Cardiac Studies   07/17/19 TTE IMPRESSIONS  1. Left ventricular ejection fraction, by visual estimation, is 60 to 65%. The left ventricle has normal function. Normal left ventricular size. Left ventricular septal wall thickness was mildly increased. Mildly increased left ventricular posterior  wall thickness. There is no left ventricular hypertrophy.  2. Left ventricular diastolic Doppler parameters are indeterminate pattern of LV diastolic filling.  3. Global right ventricle has mildly reduced systolic function.The right ventricular size is not well visualized. No increase in right ventricular wall thickness.  4. Left atrial size was normal.  5. Right atrial size was normal.  6. Severe mitral  annular calcification.  7. The mitral valve is normal in structure. No evidence of mitral valve regurgitation. Mild mitral stenosis.  8. The tricuspid valve is normal in structure. Tricuspid valve regurgitation was not visualized by color flow Doppler.  9. Moderate aortic valve annular calcification. 10. The aortic valve is normal in structure. Aortic valve regurgitation was not visualized by color flow Doppler. Mild to moderate aortic valve sclerosis/calcification without any evidence of aortic  stenosis. 11. The pulmonic valve was normal in structure. Pulmonic valve regurgitation is not visualized by color flow Doppler. 12. The inferior vena cava is dilated in size with >50% respiratory variability, suggesting right atrial pressure of 8 mmHg.  Patient Profile     78 y.o. female w/ PMH of A.fib on Xarelto, Multi vessel CAD s/p stents x3, HFpEF, HTN, HLD who presented with syncope and found to have subdural hematoma + humoral fracture.  Assessment & Plan    Syncopal Episode Frequent PVCs Hx of A.fib TTE w/o significant aortic stenosis or wall motion abnormalities. Continues to have high PVC burden on telemetry. Likely need f/u with EP. Currently vitals stable. No need for acute intervention. - C/w telemetry - Orthostatic vitals when off bedrest - C/w home med: diltiazem 30mg  BID - Holding anticoagulation due to hematoma, expected surgery - Replete K / Mag as apporpriate  Chronic diastolic heart failure Exam not appearing hypervolemic. Good urine output. Per chart review, dry weight ~75kg. Currently 73kg. - Hold home torsemide in setting of trauma and expected surgery - Monitor I&Os - Daily weights  Coronary Artery Disease s/p Stents in RAD + LADx2 Last cath in 2019 w/ intervention. On plavix at home. - Holding antiplatelet therapy insetting of hematoma  Humoral Fracture Subdural Hematoma - Management per neurosurg , ortho  HLD - c/w home med: simavastatin 10mg  daily    For questions or updates, please contact [place] Please consult [email or web address] for contact info under Cardiology/STEMI.   Signed, Gilberto Better, MD PGY-2, [facility] Pager: [phone] 07/17/2019, 9:47 AM    Attending attestation to follow

## 2019-07-17 NOTE — Progress Notes (Addendum)
Neurosurgery Service Progress Note  Subjective: No acute events overnight.    Objective: Vitals:   07/16/19 2300 07/16/19 2315 07/17/19 0000 07/17/19 0300  BP: (!) 112/99 (!) 157/56 (!) 138/47 (!) 134/56  Pulse:   81 70  Resp: 19 (!) 21 20 14   Temp:   97.9 F (36.6 C) 97.7 F (36.5 C)  TempSrc:   Oral Oral  SpO2: 96% 94% 97% 93%  Weight:   73 kg   Height:   5\' 1"  (1.549 m)    Temp (24hrs), Avg:97.8 F (36.6 C), Min:97.7 F (36.5 C), Max:97.9 F (36.6 C)  CBC Latest Ref Rng & Units 07/17/2019 07/16/2019 03/21/2018  WBC 4.0 - 10.5 K/uL 6.9 10.5 8.7  Hemoglobin 12.0 - 15.0 g/dL 8.9(L) 9.6(L) 10.8(L)  Hematocrit 36.0 - 46.0 % 27.7(L) 31.0(L) 33.0(L)  Platelets 150 - 400 K/uL 207 231 283   BMP Latest Ref Rng & Units 07/17/2019 07/16/2019 03/21/2018  Glucose 70 - 99 mg/dL 128(H) 168(H) 128(H)  BUN 8 - 23 mg/dL 23 33(H) 43(H)  Creatinine 0.44 - 1.00 mg/dL 1.10(H) 1.30(H) 1.46(H)  Sodium 135 - 145 mmol/L 140 140 133(L)  Potassium 3.5 - 5.1 mmol/L 3.8 4.2 4.3  Chloride 98 - 111 mmol/L 103 104 96(L)  CO2 22 - 32 mmol/L 25 22 22   Calcium 8.9 - 10.3 mg/dL 9.7 10.4(H) 10.4(H)    Intake/Output Summary (Last 24 hours) at 07/17/2019 0737 Last data filed at 07/17/2019 0300 Gross per 24 hour  Intake -  Output 1200 ml  Net -1200 ml    Current Facility-Administered Medications:  .  0.9 %  sodium chloride infusion, 250 mL, Intravenous, PRN, Opyd, Ilene Qua, MD .  acetaminophen (TYLENOL) tablet 650 mg, 650 mg, Oral, Q6H PRN **OR** acetaminophen (TYLENOL) suppository 650 mg, 650 mg, Rectal, Q6H PRN, Opyd, Ilene Qua, MD .  diltiazem (CARDIZEM) tablet 30 mg, 30 mg, Oral, BID, Dessa Phi, DO, 30 mg at 07/16/19 2112 .  fentaNYL (SUBLIMAZE) injection 25-50 mcg, 25-50 mcg, Intravenous, Q2H PRN, Opyd, Ilene Qua, MD, 25 mcg at 07/16/19 2027 .  labetalol (NORMODYNE) injection 5 mg, 5 mg, Intravenous, Q10 min PRN, Dessa Phi, DO .  ondansetron (ZOFRAN) tablet 4 mg, 4 mg, Oral, Q6H PRN **OR**  ondansetron (ZOFRAN) injection 4 mg, 4 mg, Intravenous, Q6H PRN, Opyd, Timothy S, MD .  polyethylene glycol (MIRALAX / GLYCOLAX) packet 17 g, 17 g, Oral, Daily PRN, Opyd, Ilene Qua, MD .  simvastatin (ZOCOR) tablet 10 mg, 10 mg, Oral, q1800, Opyd, Ilene Qua, MD, 10 mg at 07/16/19 1859 .  sodium chloride flush (NS) 0.9 % injection 3 mL, 3 mL, Intravenous, Q12H, Opyd, Ilene Qua, MD, 3 mL at 07/16/19 2115 .  sodium chloride flush (NS) 0.9 % injection 3 mL, 3 mL, Intravenous, Q12H, Opyd, Ilene Qua, MD, 3 mL at 07/16/19 2115 .  sodium chloride flush (NS) 0.9 % injection 3 mL, 3 mL, Intravenous, PRN, Opyd, Ilene Qua, MD   Physical Exam: AOx3, PERRL, EOMI, FS, Strength 5/5 but limited by L shoulder injury / sling, SILT x4  Assessment & Plan: 78 y.o. woman s/p fall with L acute SDH in the setting of rivaroxaban (DVT x2 - both sound unprovoked per pt, PAF) and clopidogrel (cardiac stent 02/2018), recovering well.  -repeat CTH shows expected improvement / evolution and dependent redistribution of SDH -can discuss w/ cardiology if needed regarding anti-PLT/anti-coags. SDH can still expand in the chronic setting if it becomes a chronic collection / membranes. Given that it's been  over a year since her stent was placed, from a neurosurgical perspective I would prefer her to resume ASA81 5 days post-trauma. But they are more familiar with the data regarding pros / cons of this approach.  -for her NOAC, would prefer 5 days off therapeutic anticoagulation -radiographically appears to be less likely to turn into a chronic SDH, will have to follow radiographically with repeat CTH in 2 weeks to confirm this  FYI, while rounding this morning she was back in AF with some bigeminy   Judith Part  07/17/19 7:37 AM

## 2019-07-17 NOTE — Progress Notes (Signed)
Follow up CT scan of left shoulder completed. It demonstrates favorable alignment for nonoperative treatment, which should be continued.  OT/ PT to continue left shoulder gentle PROM with sling for support and comfort and active motion of elbow, forearm, wrist, and hand. F/u in 10-14 days.  Altamese Slater-Marietta, MD [facility], [facility] [phone]

## 2019-07-17 NOTE — TOC Initial Note (Signed)
Transition of Care [facility]) - Initial/Assessment Note    Patient Details  Name: Melinda Maxwell MRN: [id number] Date of Birth: 08/09/1941  [facility] CM/SW Contact:    Pollie Friar, RN Phone Number: 07/17/2019, 12:04 PM  Clinical Narrative:                 Awaiting PT/OT evals. Pt has has falls at home. She has an aide that comes in 3-4 hours once a week.  Pt requesting information on transportation. She relies on her daughter or sister for transportation. Cm will provide her information for Senior transportation. Pt also requested information on Meels on Wheels. CM will provide her this information also.  TOC following for further d/c needs.   Expected Discharge Plan: Skilled Nursing Facility Barriers to Discharge: Continued Medical Work up, Other (comment)(Awaiting therapy evals)   Patient Goals and CMS Choice        Expected Discharge Plan and Services Expected Discharge Plan: [place] In-house Referral: Clinical Social Work Discharge Planning Services: CM Consult   Living arrangements for the past 2 months: [place]                                      Prior Living Arrangements/Services Living arrangements for the past 2 months: Single Family Home Lives with:: Self Patient language and need for interpreter reviewed:: Yes(no needs) Do you feel safe going back to the place where you live?: Yes        Care giver support system in place?: No (comment) Current home services: DME, Homehealth aide(shower seat/ walker/ cane/ 3 in 1//  pt has a lady that comes 3-4 hours 1 day a week to help with home care)    Activities of Daily Living      Permission Sought/Granted                  Emotional Assessment Appearance:: Appears stated age Attitude/Demeanor/Rapport: Engaged Affect (typically observed): Accepting, Pleasant Orientation: : Oriented to Self, Oriented to Place, Oriented to  Time, Oriented to Situation    Psych Involvement: No (comment)  Admission diagnosis:  Fall, initial encounter [W19.XXXA] Syncope [R55] Patient Active Problem List   Diagnosis Date Noted  . Subdural hematoma ([place]) 07/16/2019  . Closed displaced fracture of surgical neck of left humerus 07/16/2019  . Non-sustained ventricular tachycardia ([place]) 07/16/2019  . Chronic diastolic CHF (congestive heart failure) ([place]) 07/16/2019  . CKD (chronic kidney disease), stage III 07/16/2019  . Syncope   . Fall   . CAD (coronary artery disease) 03/26/2018  . Generalized weakness 03/26/2018  . Paroxysmal atrial fibrillation ([place]) 02/27/2018  . UTI (urinary tract infection) 02/18/2018  . Stable angina ([place]) 01/15/2018  . Hyperlipidemia, mixed 09/10/2014  . Moderate tricuspid insufficiency 09/10/2014  . Chronic deep vein thrombosis (DVT) ([facility]) 08/10/2014  . Diabetes mellitus with stage 3 chronic kidney disease ([place]) 07/09/2014  . Benign essential hypertension 02/21/2014  . Gout 02/21/2014  . OSA (obstructive sleep apnea) 02/21/2014   PCP:  [name], MD Pharmacy:   [id number] - [place], [place] - [address] [place] [address] Phone: [phone] Fax: [phone]     Social Determinants of Health (SDOH) Interventions    Readmission Risk Interventions No flowsheet data found.

## 2019-07-18 LAB — BASIC METABOLIC PANEL
Anion gap: 12 (ref 5–15)
BUN: 25 mg/dL — ABNORMAL HIGH (ref 8–23)
CO2: 25 mmol/L (ref 22–32)
Calcium: 9.7 mg/dL (ref 8.9–10.3)
Chloride: 102 mmol/L (ref 98–111)
Creatinine, Ser: 0.98 mg/dL (ref 0.44–1.00)
GFR calc Af Amer: >60 mL/min (ref >60)
GFR calc non Af Amer: 54 mL/min — ABNORMAL LOW (ref >60)
Glucose, Bld: 115 mg/dL — ABNORMAL HIGH (ref 70–99)
Potassium: 3.8 mmol/L (ref 3.5–5.1)
Sodium: 139 mmol/L (ref 135–145)

## 2019-07-18 LAB — MAGNESIUM: Magnesium: 1.9 mg/dL (ref 1.7–2.4)

## 2019-07-18 LAB — GLUCOSE, CAPILLARY: Glucose-Capillary: 102 mg/dL — ABNORMAL HIGH (ref 70–99)

## 2019-07-18 NOTE — Progress Notes (Addendum)
PROGRESS NOTE    Melinda Maxwell  [id number] DOB: 02-07-1941 DOA: 07/16/2019 PCP: [name], MD     Brief Narrative:  Melinda Suthard Masseyis an 78 y.o.femalewith medical history significant forcoronary artery disease, chronic diastolic CHF, DVT on Xarelto, frequent PVCs, and sleep apnea, now presenting to the emergency department with headache and left arm pain after a fall and syncopal episode at home.Patient reports that she was in her usual state of health last night, but fell after using the bathroom, believes that she lost consciousness, and was experiencing severe pain in the left arm and also had a headache when she regained awareness. She was able to get to a phone and call her daughter who then called EMS. Daughter provides additional history, reporting that within the past week, the patient was seated at a table to eat when she reported feeling as though she was going to pass out, and then did have a brief loss of consciousness, sliding out of the chair onto the floor. Patient has not had any chest pain associated with these events.   Work up revealed acute displaced fracture of the proximal left humerus, noncontrast head CT reveals subdural hematoma along the left frontal convexity, 3 mm in thickness, and no acute cervical spine fracture is identified on CT. Neurosurgery and orthopedic surgery consulted.  New events last 24 hours / Subjective: Complains of pain in her left arm, otherwise doing well.  Denies any headaches, chest pain, shortness of breath, nausea, vomiting or abdominal pain.  She is sitting in bed, eating breakfast, appears to be doing well overall.  Assessment & Plan:   Principal Problem:   Subdural hematoma (HCC) Active Problems:   CAD (coronary artery disease)   Chronic deep vein thrombosis (DVT) (HCC)   Diabetes mellitus with stage 3 chronic kidney disease (HCC)   Closed displaced fracture of surgical neck of left humerus   Non-sustained  ventricular tachycardia (HCC)   Chronic diastolic CHF (congestive heart failure) (HCC)   CKD (chronic kidney disease), stage III   Syncope   Syncope -Syncopal episode x 2 in the past week, history of non-sustained VT, A fib  -Cardiology consulted; signed off 10/9 -Cardiology recommend aspirin 81 mg daily, Eliquis 5 mg twice daily starting in 5 to 7 days -Patient to follow-up with her outpatient cardiologist for outpatient implanted loop recorder  Subdural hematoma -S/p fall -Neurosurgery consulted -Repeat CT head with redistribution of trace left convexity subdural blood, now primarily along the left tentorial leaflet. No new hemorrhage. -Resume aspirin, anticoagulation in 5 days -Repeat CT head in 2 weeks to follow-up  Displaced fracture of left humeral neck -S/p fall -Orthopedic surgery consulted -CT shoulder revealed acute comminuted, impacted, displaced and angulated fracture involving the proximal shaft of the humerus with extension to the anatomical neck. No dislocation of the humeral head. -Nonsurgical treatment.  Continue left shoulder gentle PROM with sling for support and comfort.  Active motion of elbow forearm wrist and hand. -Follow-up with Dr. Ginette Pitman in 10 to 14 days  Non-sustained VT -Patient with multiple PVCs on telemetry on my exam, noted NSVT by EMS en route -Cardiology recommends outpatient loop recorder placement by her primary cardiology  Paroxysmal atrial fibrillation -Continue Cardizem -Switch to Eliquis in 5 days  CAD -S/p PCI of LAD in May 2019 -Follows with [name] in [place] to low-dose aspirin in 5 days -Continue zocor   Chronic diastolic heart failure -Euvolemic, stable at this time -Demadex discontinued due to orthostatic hypotension  Chronic kidney disease stage III -Baseline creatinine 1.1-1.48 -Stable creatinine 0.98 today  History of DVT -Switch to Eliquis in 5 days    DVT prophylaxis: SCDs Code Status:  Full code Family Communication: None Disposition Plan: Pending further orthopedic surgery plan, PT OT pending   Consultants:   Orthopedic surgery  Neurosurgery  Cardiology  Procedures:   None  Antimicrobials:  Anti-infectives (From admission, onward)   None       Objective: Vitals:   07/17/19 2009 07/18/19 0017 07/18/19 0357 07/18/19 0905  BP: 116/68 135/71 137/60 119/84  Pulse: 71 72 69 66  Resp: 18 15 17 17   Temp: (!) 97.3 F (36.3 C) (!) 97.3 F (36.3 C) 97.6 F (36.4 C) 98.5 F (36.9 C)  TempSrc: Oral Oral Oral Oral  SpO2: 98% 97% 94% 95%  Weight:      Height:        Intake/Output Summary (Last 24 hours) at 07/18/2019 0930 Last data filed at 07/18/2019 0400 Gross per 24 hour  Intake 1396.25 ml  Output 1100 ml  Net 296.25 ml   Filed Weights   07/17/19 0000  Weight: 73 kg    Examination: General exam: Appears calm and comfortable  Respiratory system: Clear to auscultation. Respiratory effort normal. Cardiovascular system: S1 & S2 heard, irregular rhythm, normal sinus with PAC on telemetry.  No pedal edema Gastrointestinal system: Abdomen is nondistended, soft and nontender. Normal bowel sounds heard. Central nervous system: Alert and oriented. No focal neurological deficits. Extremities: Left upper extremity in sling Skin: No rashes, lesions or ulcers, left scalp hematoma Psychiatry: Judgement and insight appear normal. Mood & affect appropriate.   Data Reviewed: I have personally reviewed following labs and imaging studies  CBC: Recent Labs  Lab 07/16/19 0338 07/17/19 0426  WBC 10.5 6.9  NEUTROABS 8.8*  --   HGB 9.6* 8.9*  HCT 31.0* 27.7*  MCV 96.6 94.2  PLT 231 [id number]   Basic Metabolic Panel: Recent Labs  Lab 07/16/19 0338 07/17/19 0426 07/18/19 0316  NA 140 140 139  K 4.2 3.8 3.8  CL 104 103 102  CO2 22 25 25   GLUCOSE 168* 128* 115*  BUN 33* 23 25*  CREATININE 1.30* 1.10* 0.98  CALCIUM 10.4* 9.7 9.7  MG 1.9  --  1.9   GFR:  Estimated Creatinine Clearance: 41.8 mL/min (by C-G formula based on SCr of 0.98 mg/dL). Liver Function Tests: Recent Labs  Lab 07/16/19 0338  AST 18  ALT 12  ALKPHOS 127*  BILITOT 0.8  PROT 7.2  ALBUMIN 3.6   No results for input(s): LIPASE, AMYLASE in the last 168 hours. No results for input(s): AMMONIA in the last 168 hours. Coagulation Profile: No results for input(s): INR, PROTIME in the last 168 hours. Cardiac Enzymes: No results for input(s): CKTOTAL, CKMB, CKMBINDEX, TROPONINI in the last 168 hours. BNP (last 3 results) No results for input(s): PROBNP in the last 8760 hours. HbA1C: No results for input(s): HGBA1C in the last 72 hours. CBG: Recent Labs  Lab 07/16/19 0338 07/16/19 0856 07/17/19 0600 07/18/19 0606  GLUCAP 147* 120* 116* 102*   Lipid Profile: No results for input(s): CHOL, HDL, LDLCALC, TRIG, CHOLHDL, LDLDIRECT in the last 72 hours. Thyroid Function Tests: No results for input(s): TSH, T4TOTAL, FREET4, T3FREE, THYROIDAB in the last 72 hours. Anemia Panel: No results for input(s): VITAMINB12, FOLATE, FERRITIN, TIBC, IRON, RETICCTPCT in the last 72 hours. Sepsis Labs: No results for input(s): PROCALCITON, LATICACIDVEN in the last 168 hours.  Recent  Results (from the past 240 hour(s))  SARS CORONAVIRUS 2 (TAT 6-24 HRS) Nasopharyngeal Nasopharyngeal Swab     Status: None   Collection Time: 07/16/19  6:09 AM   Specimen: Nasopharyngeal Swab  Result Value Ref Range Status   SARS Coronavirus 2 NEGATIVE NEGATIVE Final    Comment: (NOTE) SARS-CoV-2 target nucleic acids are NOT DETECTED. The SARS-CoV-2 RNA is generally detectable in upper and lower respiratory specimens during the acute phase of infection. Negative results do not preclude SARS-CoV-2 infection, do not rule out co-infections with other pathogens, and should not be used as the sole basis for treatment or other patient management decisions. Negative results must be combined with clinical  observations, patient history, and epidemiological information. The expected result is Negative. Fact Sheet for Patients: SugarRoll.be Fact Sheet for Healthcare Providers: https://www.woods-mathews.com/ This test is not yet approved or cleared by the [place] FDA and  has been authorized for detection and/or diagnosis of SARS-CoV-2 by FDA under an Emergency Use Authorization (EUA). This EUA will remain  in effect (meaning this test can be used) for the duration of the COVID-19 declaration under Section 56 4(b)(1) of the Act, 21 U.S.C. section 360bbb-3(b)(1), unless the authorization is terminated or revoked sooner. Performed at [facility], [place] [address], [place], [place] [address]       Radiology Studies: Ct Head Wo Contrast  Result Date: 07/17/2019 CLINICAL DATA:  Subdural hematoma follow-up EXAM: CT HEAD WITHOUT CONTRAST TECHNIQUE: Contiguous axial images were obtained from the base of the skull through the vertex without intravenous contrast. COMPARISON:  07/16/2019 CT head FINDINGS: Brain: Redistribution of trace left convexity subdural blood. No new hemorrhage. The blood is now primarily along the left tentorial leaflet. There is periventricular hypoattenuation compatible with chronic microvascular disease. Vascular: Atherosclerotic calcification of the vertebral and internal carotid arteries at the skull base. No abnormal hyperdensity of the major intracranial arteries or dural venous sinuses. Skull: Unchanged size of left frontoparietal scalp hematoma. No skull fracture. Sinuses/Orbits: No fluid levels or advanced mucosal thickening of the visualized paranasal sinuses. No mastoid or middle ear effusion. The orbits are normal. IMPRESSION: 1. Redistribution of trace left convexity subdural blood, now primarily along the left tentorial leaflet. No new hemorrhage. 2. Unchanged size of left frontoparietal scalp hematoma. Electronically  Signed   By: Ulyses Jarred M.D.   On: 07/17/2019 02:03   Ct Shoulder Left Wo Contrast  Result Date: 07/17/2019 CLINICAL DATA:  Shoulder fracture EXAM: CT OF THE UPPER LEFT EXTREMITY WITHOUT CONTRAST TECHNIQUE: Multidetector CT imaging of the upper left extremity was performed according to the standard protocol. COMPARISON:  Radiograph 07/16/2019 FINDINGS: Bones/Joint/Cartilage Left AC joint is intact. No clavicle fracture. Left upper ribs are intact. No scapular fracture. Acute comminuted fracture involving the proximal shaft of the humerus, and extending to the anatomical neck. About 1/2 bone width displacement of the distal fracture fragment toward the midline with mild to moderate valgus angulation of distal fracture fragment. Impaction of the outer cortex of the humerus shaft into the humeral neck by approximately 14 mm. 19 mm horizontally oriented bone fragment anterior to the humeral neck with smaller 7 mm cortical bone fragment posterior to the neck. No humeral head dislocation. No definite articular surface extension of fracture lucency. Ligaments Suboptimally assessed by CT. Muscles and Tendons No significant atrophy.  No intramuscular fluid collection. Soft tissues Moderate edema within the left axillary and shoulder soft tissues. IMPRESSION: 1. Acute comminuted, impacted, displaced and angulated fracture involving the proximal  shaft of the humerus with extension to the anatomical neck. No dislocation of the humeral head. 2. Moderate soft tissue edema at the left shoulder. Electronically Signed   By: Donavan Foil M.D.   On: 07/17/2019 02:17      Scheduled Meds: . diltiazem  30 mg Oral BID  . simvastatin  10 mg Oral q1800  . sodium chloride flush  3 mL Intravenous Q12H  . sodium chloride flush  3 mL Intravenous Q12H   Continuous Infusions: . sodium chloride       LOS: 2 days      Time spent:25 minutes   Dessa Phi, DO [facility] 07/18/2019, 9:30 AM   Available via  Epic secure chat 7am-7pm After these hours, please refer to coverage provider listed on [email or web address]

## 2019-07-18 NOTE — NC FL2 (Signed)
[place] LEVEL OF CARE SCREENING TOOL     IDENTIFICATION  Patient Name: Melinda Maxwell Birthdate: 1941-03-10 Sex: female Admission Date (Current Location): 07/16/2019  [facility] and [place] Number:  Herbalist and Address:  [facility] [place], [place] [address] [place], [place] [address]      Provider Number: [id number]  Attending Physician Name and Address:  Dessa Phi, DO  Relative Name and Phone Number:  Aaruhi Flitton, Daughter, [phone]    Current Level of Care: Hospital Recommended Level of Care: [place] Prior Approval Number:    Date Approved/Denied: 02/23/18 PASRR Number: [id number]  Discharge Plan: SNF    Current Diagnoses: Patient Active Problem List   Diagnosis Date Noted  . Subdural hematoma ([place]) 07/16/2019  . Closed displaced fracture of surgical neck of left humerus 07/16/2019  . Non-sustained ventricular tachycardia ([place]) 07/16/2019  . Chronic diastolic CHF (congestive heart failure) ([place]) 07/16/2019  . CKD (chronic kidney disease), stage III 07/16/2019  . Syncope   . Fall   . CAD (coronary artery disease) 03/26/2018  . Generalized weakness 03/26/2018  . Paroxysmal atrial fibrillation ([place]) 02/27/2018  . UTI (urinary tract infection) 02/18/2018  . Stable angina ([place]) 01/15/2018  . Hyperlipidemia, mixed 09/10/2014  . Moderate tricuspid insufficiency 09/10/2014  . Chronic deep vein thrombosis (DVT) (HCC) 08/10/2014  . Diabetes mellitus with stage 3 chronic kidney disease ([place]) 07/09/2014  . Benign essential hypertension 02/21/2014  . Gout 02/21/2014  . OSA (obstructive sleep apnea) 02/21/2014    Orientation RESPIRATION BLADDER Height & Weight     Self, Time, Situation, Place  Normal Continent Weight: 160 lb 15 oz (73 kg) Height:  5\' 1"  (154.9 cm)  BEHAVIORAL SYMPTOMS/MOOD NEUROLOGICAL BOWEL NUTRITION STATUS      Continent Diet(Heart Healthy diet, thin liquids)  AMBULATORY STATUS  COMMUNICATION OF NEEDS Skin   Limited Assist Verbally Normal(Has arm in a sling)                       Personal Care Assistance Level of Assistance  Bathing, Feeding, Dressing, Total care Bathing Assistance: Limited assistance Feeding assistance: Independent Dressing Assistance: Limited assistance Total Care Assistance: Limited assistance   Functional Limitations Info  Sight, Hearing, Speech Sight Info: Adequate Hearing Info: Adequate Speech Info: Adequate    SPECIAL CARE FACTORS FREQUENCY  PT (By licensed PT), OT (By licensed OT)     PT Frequency: 5x/wk OT Frequency: 5x/wk            Contractures Contractures Info: Not present    Additional Factors Info  Code Status, Allergies Code Status Info: Full Code Allergies Info: Benzocaine, Lisinopril, Neomycin, Sulfamethoxazole-trimethoprim, Sulfa Antibiotics           Current Medications (07/18/2019):  This is the current hospital active medication list Current Facility-Administered Medications  Medication Dose Route Frequency Provider Last Rate Last Dose  . 0.9 %  sodium chloride infusion  250 mL Intravenous PRN Opyd, Ilene Qua, MD      . acetaminophen (TYLENOL) tablet 650 mg  650 mg Oral Q6H PRN Opyd, Ilene Qua, MD   650 mg at 07/18/19 0909   Or  . acetaminophen (TYLENOL) suppository 650 mg  650 mg Rectal Q6H PRN Opyd, Ilene Qua, MD      . diltiazem (CARDIZEM) tablet 30 mg  30 mg Oral BID Dessa Phi, DO   30 mg at 07/18/19 1041  . fentaNYL (SUBLIMAZE) injection 25-50 mcg  25-50 mcg  Intravenous Q2H PRN Vianne Bulls, MD   25 mcg at 07/17/19 1334  . labetalol (NORMODYNE) injection 5 mg  5 mg Intravenous Q10 min PRN Dessa Phi, DO      . ondansetron (ZOFRAN) tablet 4 mg  4 mg Oral Q6H PRN Opyd, Ilene Qua, MD       Or  . ondansetron (ZOFRAN) injection 4 mg  4 mg Intravenous Q6H PRN Opyd, Ilene Qua, MD      . polyethylene glycol (MIRALAX / GLYCOLAX) packet 17 g  17 g Oral Daily PRN Opyd, Ilene Qua, MD      .  simvastatin (ZOCOR) tablet 10 mg  10 mg Oral q1800 Opyd, Ilene Qua, MD   10 mg at 07/17/19 1730  . sodium chloride flush (NS) 0.9 % injection 3 mL  3 mL Intravenous Q12H Opyd, Ilene Qua, MD   3 mL at 07/17/19 2220  . sodium chloride flush (NS) 0.9 % injection 3 mL  3 mL Intravenous Q12H Opyd, Ilene Qua, MD   3 mL at 07/18/19 1041  . sodium chloride flush (NS) 0.9 % injection 3 mL  3 mL Intravenous PRN Opyd, Ilene Qua, MD         Discharge Medications: Please see discharge summary for a list of discharge medications.  Relevant Imaging Results:  Relevant Lab Results:   Additional Information SSN: [id number]  [place], LCSWA

## 2019-07-18 NOTE — Progress Notes (Signed)
Overall doing well.  No new issues or problems.  Patient resting comfortably.  She awakens easily.  She is appropriate and oriented.  Her motor and sensory function are stable.  Patient was small subdural hematoma complicated by anticoagulation.  Hold anticoagulation for a total of 5days.  No new recommendations.

## 2019-07-18 NOTE — Evaluation (Signed)
Occupational Therapy Evaluation Patient Details Name: Melinda Maxwell MRN: [id number] DOB: 03/30/1941 Today's Date: 07/18/2019    History of Present Illness Melinda Maxwell is an 78 y.o. female with medical history significant for coronary artery disease, chronic diastolic CHF, DVT on Xarelto, frequent PVCs, and sleep apnea, now presenting to the emergency department  - pt with left subdural hematoma and left proximal humerus fracture.   Clinical Impression   Pt PTA: living alone and independent with frequent falls. Pt currently limited by decreased ability to perform ADL functional transfers and ADL tasks due to poor mobility, decreased confidence in taking steps and LUE weakness and pain.Pt very limited for PROM at shoulder to about 10* of motion; elbow unable to get to 180* due to pain and wrist, hand 50% of typical movements. Pt performing functional transfers +2 modA to maxA hand heldA for stand pivot and minimal ability to move BLEs. Pt reported no dizziness today. BP 150/63 with exertion. 95% O2 upon exertion and HR 75 BPM. Pt requires continued OT for above deficits acutely.  Pain in L shoulder remains. Pt wanting surgery "my shoulder needs to be stabilized, doesn't it?"    Follow Up Recommendations  SNF    Equipment Recommendations  Other (comment)(to be determined at next venue)    Recommendations for Other Services       Precautions / Restrictions Precautions Precautions: Fall;Shoulder Type of Shoulder Precautions: Non operative/conservative Shoulder Interventions: Shoulder sling/immobilizer Precaution Comments: gentle PROM to shoulder; AROM elbow through hand Required Braces or Orthoses: Sling Restrictions Weight Bearing Restrictions: Yes LUE Weight Bearing: Non weight bearing Other Position/Activity Restrictions: left shoulder gentle PROM with sling for support and comfort and active motion of elbow, forearm, wrist, and hand.       Mobility Bed Mobility Overal bed  mobility: Needs Assistance Bed Mobility: Supine to Sit;Sit to Supine     Supine to sit: Mod assist;+2 for physical assistance     General bed mobility comments: trunk elevation and assist for BLEs.  Transfers Overall transfer level: Needs assistance Equipment used: 1 person hand held assist(used to support LUE and move LLE as PT on R side ) Transfers: Sit to/from [facility] Sit to Stand: Max assist;From elevated surface Stand pivot transfers: Mod assist;Max assist;+2 physical assistance;+2 safety/equipment       General transfer comment: modA to maxA for stand pivot taking small steps with LLE and required guidance to continues to step.    Balance Overall balance assessment: Needs assistance   Sitting balance-Leahy Scale: Fair Sitting balance - Comments: Pt with increased sitting balance compared to yesterday     Standing balance-Leahy Scale: Poor Standing balance comment: +2 modA to maxA for support                           ADL either performed or assessed with clinical judgement   ADL Overall ADL's : Needs assistance/impaired Eating/Feeding: Modified independent;Sitting   Grooming: Minimal assistance;Sitting Grooming Details (indicate cue type and reason): LUE in sling Upper Body Bathing: Moderate assistance;Sitting   Lower Body Bathing: Maximal assistance;Sitting/lateral leans;Sit to/from stand   Upper Body Dressing : Moderate assistance;Sitting;Standing   Lower Body Dressing: Maximal assistance;Sitting/lateral leans;Sit to/from stand;Cueing for safety   Toilet Transfer: Maximal assistance;+2 for physical assistance;+2 for safety/equipment;Stand-pivot Toilet Transfer Details (indicate cue type and reason): +2 hand heldA for stand pivot and minimal ability to move BLEs. Toileting- Clothing Manipulation and Hygiene: Maximal assistance;+2 for physical assistance;+2  for safety/equipment;Sitting/lateral lean;Sit to/from stand        Functional mobility during ADLs: Moderate assistance;Maximal assistance;+2 for physical assistance;+2 for safety/equipment;Cueing for safety;Cueing for sequencing General ADL Comments: Pt with decreased ability to perform ADL functional transfers and ADL tasks due to poor mobility, decreased confidence in taking steps and LUE weakness and pain.     Vision Baseline Vision/History: Wears glasses Wears Glasses: Reading only Patient Visual Report: No change from baseline Vision Assessment?: No apparent visual deficits Additional Comments: pt reports "my left eye has been blurry before I fell."     Perception     Praxis      Pertinent Vitals/Pain Pain Assessment: Faces Faces Pain Scale: Hurts even more Pain Location: left arm Pain Descriptors / Indicators: Aching;Guarding Pain Intervention(s): Limited activity within patient's tolerance     Hand Dominance Right   Extremity/Trunk Assessment Upper Extremity Assessment Upper Extremity Assessment: Generalized weakness;LUE deficits/detail LUE Deficits / Details: very limited for PROM at shoulder to about 10* of motion; elbow unable to get to 180* due to pain and wrist, hand 50% of typical movements. LUE: Unable to fully assess due to pain LUE Coordination: decreased fine motor;decreased gross motor   Lower Extremity Assessment Lower Extremity Assessment: Generalized weakness   Cervical / Trunk Assessment Cervical / Trunk Assessment: Other exceptions Cervical / Trunk Exceptions: large body habitus   Communication Communication Communication: No difficulties   Cognition Arousal/Alertness: Awake/alert Behavior During Therapy: WFL for tasks assessed/performed Overall Cognitive Status: Within Functional Limits for tasks assessed                                     General Comments  Pt reported no dizziness today. BP 150/63 with exertion. 95% O2 upon exertion and HR 75 BPM    Exercises Exercises: Other  exercises Other Exercises Other Exercises: PROM shoulder flex, abduction minimal ~10* of movements Other Exercises: elbow wrist and hand exercises x10 reps ~ 50% capacity   Shoulder Instructions      Home Living Family/patient expects to be discharged to:: Private residence Living Arrangements: Alone Available Help at Discharge: Family;Available PRN/intermittently Type of Home: House Home Access: Stairs to enter [employer] of Steps: 2   Home Layout: One level     Bathroom Shower/Tub: Teacher, early years/pre: Standard     Home Equipment: Environmental consultant - 2 wheels;Cane - single point   Additional Comments: Pt was living alone. she has had 3 falls this month.      Prior Functioning/Environment Level of Independence: Independent with assistive device(s)                 OT Problem List: Decreased strength;Decreased range of motion;Decreased activity tolerance;Impaired balance (sitting and/or standing);Decreased coordination;Decreased safety awareness;Impaired UE functional use;Pain      OT Treatment/Interventions: Self-care/ADL training;Therapeutic exercise;Energy conservation;DME and/or AE instruction;Therapeutic activities;Patient/family education;Balance training    OT Goals(Current goals can be found in the care plan section) Acute Rehab OT Goals Patient Stated Goal: to get better and stop falling OT Goal Formulation: With patient Time For Goal Achievement: 08/01/19 Potential to Achieve Goals: Good ADL Goals Pt Will Perform Grooming: sitting;with set-up Pt Will Perform Upper Body Dressing: with set-up;sitting Pt Will Perform Lower Body Dressing: with min assist;sit to/from stand Pt Will Perform Toileting - Clothing Manipulation and hygiene: with min assist;sit to/from stand Pt/caregiver will Perform Home Exercise Program: Left upper extremity;With written HEP provided;With  minimal assist;Increased ROM  OT Frequency: Min 2X/week   Barriers to D/C:  Decreased caregiver support  live alone       Co-evaluation              AM-PAC OT "6 Clicks" Daily Activity     Outcome Measure Help from another person eating meals?: A Little Help from another person taking care of personal grooming?: A Lot Help from another person toileting, which includes using toliet, bedpan, or urinal?: Total Help from another person bathing (including washing, rinsing, drying)?: A Lot Help from another person to put on and taking off regular upper body clothing?: A Lot Help from another person to put on and taking off regular lower body clothing?: A Lot 6 Click Score: 12   End of Session Equipment Utilized During Treatment: Gait belt;Other (comment)(sling) Nurse Communication: Mobility status;Precautions;Weight bearing status  Activity Tolerance: Patient limited by pain Patient left: in chair;with call bell/phone within reach;with chair alarm set  OT Visit Diagnosis: Unsteadiness on feet (R26.81);Muscle weakness (generalized) (M62.81);Pain Pain - Right/Left: Left Pain - part of body: Arm                Time: 1109-1150 OT Time Calculation (min): 41 min Charges:  OT General Charges $OT Visit: 1 Visit OT Evaluation $OT Eval Moderate Complexity: 1 Mod OT Treatments $Therapeutic Exercise: 8-22 mins  Ebony Hail Harold Hedge) Marsa Aris OTR/L Acute Rehabilitation Services Pager: [phone] Office: [phone]   Audie Pinto 07/18/2019, 3:47 PM

## 2019-07-18 NOTE — Progress Notes (Signed)
Physical Therapy Treatment Patient Details Name: Melinda Maxwell MRN: [id number] DOB: 05-20-1941 Today's Date: 07/18/2019    History of Present Illness Melinda Maxwell is an 78 y.o. female with medical history significant for coronary artery disease, chronic diastolic CHF, DVT on Xarelto, frequent PVCs, and sleep apnea, now presenting to the emergency department  - pt with left subdural hematoma and left proximal humerus fracture.    PT Comments    Pt progressing slowly towards physical therapy goals. Requiring two person maximal assist for transfer to chair, unable to initiate taking steps. Pt denying dizziness, BP 150/63 post mobility. Displays LUE weakness, pain, decreased range of motion, balance impairments, decreased cognition. Continue to recommend SNF for ongoing Physical Therapy.       Follow Up Recommendations  SNF;Supervision/Assistance - 24 hour     Equipment Recommendations  None recommended by PT    Recommendations for Other Services       Precautions / Restrictions Precautions Precautions: Fall;Shoulder Type of Shoulder Precautions: Non operative/conservative Shoulder Interventions: Shoulder sling/immobilizer Precaution Comments: gentle PROM to shoulder; AROM elbow through hand Required Braces or Orthoses: Sling Restrictions Weight Bearing Restrictions: Yes LUE Weight Bearing: Non weight bearing Other Position/Activity Restrictions: left shoulder gentle PROM with sling for support and comfort and active motion of elbow, forearm, wrist, and hand.     Mobility  Bed Mobility Overal bed mobility: Needs Assistance Bed Mobility: Supine to Sit;Sit to Supine     Supine to sit: Mod assist;+2 for physical assistance     General bed mobility comments: trunk elevation and assist for BLEs.  Transfers Overall transfer level: Needs assistance Equipment used: 1 person hand held assist(used to support LUE and move LLE as PT on R side ) Transfers: Sit to/from [employer] Sit to Stand: Max assist;From elevated surface Stand pivot transfers: Max assist;+2 physical assistance;+2 safety/equipment       General transfer comment: maxA +2 for stand pivot taking small steps with LLE and required guidance to continue to step.  Ambulation/Gait                 Stairs             Wheelchair Mobility    Modified Rankin (Stroke Patients Only)       Balance Overall balance assessment: Needs assistance   Sitting balance-Leahy Scale: Fair Sitting balance - Comments: Pt with increased sitting balance compared to yesterday     Standing balance-Leahy Scale: Poor Standing balance comment: +2 modA to maxA for support                            Cognition Arousal/Alertness: Awake/alert Behavior During Therapy: WFL for tasks assessed/performed Overall Cognitive Status: Impaired/Different from baseline Area of Impairment: Memory;Problem solving;Awareness                     Memory: Decreased short-term memory     Awareness: Emergent Problem Solving: Difficulty sequencing;Requires verbal cues General Comments: Attempting to press back of phone to call nurse      Exercises Other Exercises Other Exercises: PROM shoulder flex, abduction minimal ~10* of movements Other Exercises: elbow wrist and hand exercises x10 reps ~ 50% capacity    General Comments General comments (skin integrity, edema, etc.): Pt reported no dizziness today. BP 150/63 with exertion. 95% O2 upon exertion and HR 75 BPM      Pertinent Vitals/Pain Pain Assessment: Faces Faces Pain Scale:  Hurts even more Pain Location: left arm Pain Descriptors / Indicators: Aching;Guarding Pain Intervention(s): Limited activity within patient's tolerance;Repositioned    Home Living Family/patient expects to be discharged to:: Private residence Living Arrangements: Alone Available Help at Discharge: Family;Available PRN/intermittently Type of Home:  House Home Access: Stairs to enter   Home Layout: One level Home Equipment: Environmental consultant - 2 wheels;Cane - single point Additional Comments: Pt was living alone. she has had 3 falls this month.    Prior Function Level of Independence: Independent with assistive device(s)          PT Goals (current goals can now be found in the care plan section) Acute Rehab PT Goals Patient Stated Goal: to get better and stop falling Potential to Achieve Goals: Good Progress towards PT goals: Progressing toward goals    Frequency    Min 3X/week      PT Plan Current plan remains appropriate    Co-evaluation PT/OT/SLP Co-Evaluation/Treatment: Yes Reason for Co-Treatment: For patient/therapist safety;To address functional/ADL transfers PT goals addressed during session: Mobility/safety with mobility        AM-PAC PT "6 Clicks" Mobility   Outcome Measure  Help needed turning from your back to your side while in a flat bed without using bedrails?: A Lot Help needed moving from lying on your back to sitting on the side of a flat bed without using bedrails?: A Lot Help needed moving to and from a bed to a chair (including a wheelchair)?: Total Help needed standing up from a chair using your arms (e.g., wheelchair or bedside chair)?: Total Help needed to walk in hospital room?: Total Help needed climbing 3-5 steps with a railing? : Total 6 Click Score: 8    End of Session Equipment Utilized During Treatment: Gait belt;Other (comment)(sling) Activity Tolerance: Patient tolerated treatment well Patient left: in chair;with call bell/phone within reach;with chair alarm set Nurse Communication: Mobility status PT Visit Diagnosis: Repeated falls (R29.6);History of falling (Z91.81);Muscle weakness (generalized) (M62.81);Pain;Dizziness and giddiness (R42);Difficulty in walking, not elsewhere classified (R26.2) Pain - Right/Left: Left Pain - part of body: Arm     Time: [id number] PT Time Calculation  (min) (ACUTE ONLY): 23 min  Charges:  $Therapeutic Activity: 8-22 mins                     Ellamae Sia, PT, DPT Acute Rehabilitation Services Pager [phone] Office [phone]    Willy Eddy 07/18/2019, 4:56 PM

## 2019-07-18 NOTE — TOC Progression Note (Signed)
Transition of Care [facility]) - Progression Note    Patient Details  Name: CYDNE GRAHN MRN: [id number] Date of Birth: 02-26-1941  Transition of [facility]) CM/SW [place], [place] Phone Number: 07/18/2019, 2:16 PM  Clinical Narrative:     CSW met with the patient at bedside. CSW introduced herself and explained her role. CSW shared the therapy recommendation. The patient stated that she is agreeable to rehab. She stated that she has been to rehab before at [facility]. She stated that she currently lives alone but her daughter and sister assist her with her errands. The patient stated that she does not drive anymore and is reliant on her daughter and sister to transport her around. She confirmed her PCP and she can afford her medications. She asked the CSW to contact her daughter and inform her of the SNF plan. She stated that she and her daughter make big decisions together.   CSW called and spoke with the patient's daughter, [name]. CSW introduced herself, explained her role, and shared the plan for SNF. She is agreeable. She would like to start with [facility] or [facility]. CSW obtained her email address to provide CMS SNF List.   CSW obtained permission to fax the patient out and provide resources. CSW will continue to follow and assist with disposition planning.   Expected Discharge Plan: Skilled Nursing Facility Barriers to Discharge: SNF Pending bed offer, Ship broker, Continued Medical Work up  Expected Discharge Plan and Services Expected Discharge Plan: [place] In-house Referral: Clinical Social Work Discharge Planning Services: NA Post Acute Care Choice: [place] Living arrangements for the past 2 months: Single Family Home                 DME Arranged: N/A DME Agency: NA       HH Arranged: NA HH Agency: NA         Social Determinants of Health (SDOH) Interventions    Readmission Risk  Interventions No flowsheet data found.

## 2019-07-19 LAB — GLUCOSE, CAPILLARY
Glucose-Capillary: 117 mg/dL — ABNORMAL HIGH (ref 70–99)
Glucose-Capillary: 146 mg/dL — ABNORMAL HIGH (ref 70–99)
Glucose-Capillary: 144 mg/dL — ABNORMAL HIGH (ref 70–99)

## 2019-07-19 NOTE — Plan of Care (Signed)
  Problem: Pain Managment: Goal: General experience of comfort will improve Outcome: Progressing   

## 2019-07-19 NOTE — Progress Notes (Signed)
PROGRESS NOTE    JONAYA KWOK  [id number] DOB: 09-19-1941 DOA: 07/16/2019 PCP: [name], MD     Brief Narrative:  Milira Helman Masseyis an 78 y.o.femalewith medical history significant forcoronary artery disease, chronic diastolic CHF, DVT on Xarelto, frequent PVCs, and sleep apnea, now presenting to the emergency department with headache and left arm pain after a fall and syncopal episode at home.Patient reports that she was in her usual state of health last night, but fell after using the bathroom, believes that she lost consciousness, and was experiencing severe pain in the left arm and also had a headache when she regained awareness. She was able to get to a phone and call her daughter who then called EMS. Daughter provides additional history, reporting that within the past week, the patient was seated at a table to eat when she reported feeling as though she was going to pass out, and then did have a brief loss of consciousness, sliding out of the chair onto the floor. Patient has not had any chest pain associated with these events.   Work up revealed acute displaced fracture of the proximal left humerus, noncontrast head CT reveals subdural hematoma along the left frontal convexity, 3 mm in thickness, and no acute cervical spine fracture is identified on CT. Neurosurgery and orthopedic surgery consulted.  New events last 24 hours / Subjective: Complains of some dizziness and shortness of breath.  Eating breakfast without any complaints of nausea, vomiting.  Denies any chest pain.  Assessment & Plan:   Principal Problem:   Subdural hematoma (HCC) Active Problems:   CAD (coronary artery disease)   Chronic deep vein thrombosis (DVT) (HCC)   Diabetes mellitus with stage 3 chronic kidney disease (HCC)   Closed displaced fracture of surgical neck of left humerus   Non-sustained ventricular tachycardia (HCC)   Chronic diastolic CHF (congestive heart failure) (HCC)   CKD  (chronic kidney disease), stage III   Syncope   Syncope -Syncopal episode x 2 in the past week, history of non-sustained VT, A fib  -Cardiology consulted; signed off 10/9 -Cardiology recommend aspirin 81 mg daily, Eliquis 5 mg twice daily starting in 5 days -Patient to follow-up with her outpatient cardiologist for outpatient implanted loop recorder  Subdural hematoma -S/p fall -Neurosurgery consulted -Repeat CT head with redistribution of trace left convexity subdural blood, now primarily along the left tentorial leaflet. No new hemorrhage. -Resume aspirin, anticoagulation in 5 days -Repeat CT head in 2 weeks to follow-up  Displaced fracture of left humeral neck -S/p fall -Orthopedic surgery consulted -CT shoulder revealed acute comminuted, impacted, displaced and angulated fracture involving the proximal shaft of the humerus with extension to the anatomical neck. No dislocation of the humeral head. -Nonsurgical treatment.  Continue left shoulder gentle PROM with sling for support and comfort.  Active motion of elbow forearm wrist and hand. -Follow-up with Dr. Ginette Pitman in 10 to 14 days  Non-sustained VT -Patient with multiple PVCs on telemetry on my exam, noted NSVT by EMS en route -Cardiology recommends outpatient loop recorder placement by her primary cardiology  Paroxysmal atrial fibrillation -Continue Cardizem -Switch to Eliquis in 5 days  CAD -S/p PCI of LAD in May 2019 -Follows with [name] in [place] to low-dose aspirin in 5 days -Continue zocor   Chronic diastolic heart failure -Euvolemic, stable at this time -Demadex discontinued due to orthostatic hypotension  Chronic kidney disease stage III -Baseline creatinine 1.1-1.48 -Stable   History of DVT -Switch to Eliquis  in 5 days    DVT prophylaxis: SCDs Code Status: Full code Family Communication: None Disposition Plan: SNF placement pending.  Resume Eliquis, aspirin 10/13   Consultants:   Orthopedic surgery  Neurosurgery  Cardiology  Procedures:   None  Antimicrobials:  Anti-infectives (From admission, onward)   None       Objective: Vitals:   07/18/19 2259 07/19/19 0307 07/19/19 0500 07/19/19 0902  BP: 136/71 139/62  (!) 151/68  Pulse: (!) 40 (!) 49  69  Resp: 16 16  18   Temp: 97.6 F (36.4 C) 97.7 F (36.5 C)  99 F (37.2 C)  TempSrc: Oral Oral  Oral  SpO2: 96% 97%  97%  Weight:   75.6 kg   Height:        Intake/Output Summary (Last 24 hours) at 07/19/2019 0953 Last data filed at 07/18/2019 1900 Gross per 24 hour  Intake 450 ml  Output -  Net 450 ml   Filed Weights   07/17/19 0000 07/19/19 0500  Weight: 73 kg 75.6 kg    Examination: General exam: Appears calm and comfortable, left frontal scalp hematoma Respiratory system: Clear to auscultation. Respiratory effort normal. Cardiovascular system: S1 & S2 heard, irregular rhythm, + murmur, no peripheral edema Gastrointestinal system: Abdomen is nondistended, soft and nontender. Normal bowel sounds heard. Central nervous system: Alert and oriented. Non focal exam. Speech clear  Extremities: Left upper extremity in a sling Skin: No rashes, lesions or ulcers on exposed skin  Psychiatry: Judgement and insight appear stable. Mood & affect appropriate.    Data Reviewed: I have personally reviewed following labs and imaging studies  CBC: Recent Labs  Lab 07/16/19 0338 07/17/19 0426  WBC 10.5 6.9  NEUTROABS 8.8*  --   HGB 9.6* 8.9*  HCT 31.0* 27.7*  MCV 96.6 94.2  PLT 231 [id number]   Basic Metabolic Panel: Recent Labs  Lab 07/16/19 0338 07/17/19 0426 07/18/19 0316  NA 140 140 139  K 4.2 3.8 3.8  CL 104 103 102  CO2 22 25 25   GLUCOSE 168* 128* 115*  BUN 33* 23 25*  CREATININE 1.30* 1.10* 0.98  CALCIUM 10.4* 9.7 9.7  MG 1.9  --  1.9   GFR: Estimated Creatinine Clearance: 42.6 mL/min (by C-G formula based on SCr of 0.98 mg/dL). Liver Function Tests: Recent Labs   Lab 07/16/19 0338  AST 18  ALT 12  ALKPHOS 127*  BILITOT 0.8  PROT 7.2  ALBUMIN 3.6   No results for input(s): LIPASE, AMYLASE in the last 168 hours. No results for input(s): AMMONIA in the last 168 hours. Coagulation Profile: No results for input(s): INR, PROTIME in the last 168 hours. Cardiac Enzymes: No results for input(s): CKTOTAL, CKMB, CKMBINDEX, TROPONINI in the last 168 hours. BNP (last 3 results) No results for input(s): PROBNP in the last 8760 hours. HbA1C: No results for input(s): HGBA1C in the last 72 hours. CBG: Recent Labs  Lab 07/16/19 0338 07/16/19 0856 07/17/19 0600 07/18/19 0606 07/19/19 0606  GLUCAP 147* 120* 116* 102* 117*   Lipid Profile: No results for input(s): CHOL, HDL, LDLCALC, TRIG, CHOLHDL, LDLDIRECT in the last 72 hours. Thyroid Function Tests: No results for input(s): TSH, T4TOTAL, FREET4, T3FREE, THYROIDAB in the last 72 hours. Anemia Panel: No results for input(s): VITAMINB12, FOLATE, FERRITIN, TIBC, IRON, RETICCTPCT in the last 72 hours. Sepsis Labs: No results for input(s): PROCALCITON, LATICACIDVEN in the last 168 hours.  Recent Results (from the past 240 hour(s))  SARS CORONAVIRUS 2 (TAT 6-24  HRS) Nasopharyngeal Nasopharyngeal Swab     Status: None   Collection Time: 07/16/19  6:09 AM   Specimen: Nasopharyngeal Swab  Result Value Ref Range Status   SARS Coronavirus 2 NEGATIVE NEGATIVE Final    Comment: (NOTE) SARS-CoV-2 target nucleic acids are NOT DETECTED. The SARS-CoV-2 RNA is generally detectable in upper and lower respiratory specimens during the acute phase of infection. Negative results do not preclude SARS-CoV-2 infection, do not rule out co-infections with other pathogens, and should not be used as the sole basis for treatment or other patient management decisions. Negative results must be combined with clinical observations, patient history, and epidemiological information. The expected result is Negative. Fact  Sheet for Patients: SugarRoll.be Fact Sheet for Healthcare Providers: https://www.woods-mathews.com/ This test is not yet approved or cleared by the [place] FDA and  has been authorized for detection and/or diagnosis of SARS-CoV-2 by FDA under an Emergency Use Authorization (EUA). This EUA will remain  in effect (meaning this test can be used) for the duration of the COVID-19 declaration under Section 56 4(b)(1) of the Act, 21 U.S.C. section 360bbb-3(b)(1), unless the authorization is terminated or revoked sooner. Performed at [facility], [place] [address], [place], [place] [address]       Radiology Studies: No results found.    Scheduled Meds: . diltiazem  30 mg Oral BID  . simvastatin  10 mg Oral q1800  . sodium chloride flush  3 mL Intravenous Q12H   Continuous Infusions: . sodium chloride       LOS: 3 days      Time spent: 25 minutes   Dessa Phi, DO [facility] 07/19/2019, 9:53 AM   Available via Epic secure chat 7am-7pm After these hours, please refer to coverage provider listed on [email or web address]

## 2019-07-19 NOTE — Progress Notes (Signed)
[name], female   DOB: 1941-04-09, 78 y.o.   MRN: [id number] BP (!) 151/68 (BP Location: Right Arm)   Pulse 69   Temp 99 F (37.2 C) (Oral)   Resp 18   Ht 5\' 1"  (1.549 m)   Wt 75.6 kg   SpO2 97%   BMI 31.49 kg/m  Alert and oriented x 4, speech is clear and fluent No neuro complaints Complaining of Left arm and shoulder pain. Wants to know why the "surgeons" won't operate.

## 2019-07-20 LAB — GLUCOSE, CAPILLARY: Glucose-Capillary: 129 mg/dL — ABNORMAL HIGH (ref 70–99)

## 2019-07-20 NOTE — Progress Notes (Signed)
PROGRESS NOTE    Melinda Maxwell  [id number] DOB: July 31, 1941 DOA: 07/16/2019 PCP: [name], MD     Brief Narrative:  Melinda Kawaguchi Masseyis an 78 y.o.femalewith medical history significant forcoronary artery disease, chronic diastolic CHF, DVT on Xarelto, frequent PVCs, and sleep apnea, now presenting to the emergency department with headache and left arm pain after a fall and syncopal episode at home.Patient reports that she was in her usual state of health last night, but fell after using the bathroom, believes that she lost consciousness, and was experiencing severe pain in the left arm and also had a headache when she regained awareness. She was able to get to a phone and call her daughter who then called EMS. Daughter provides additional history, reporting that within the past week, the patient was seated at a table to eat when she reported feeling as though she was going to pass out, and then did have a brief loss of consciousness, sliding out of the chair onto the floor. Patient has not had any chest pain associated with these events.   Work up revealed acute displaced fracture of the proximal left humerus, noncontrast head CT reveals subdural hematoma along the left frontal convexity, 3 mm in thickness, and no acute cervical spine fracture is identified on CT. Neurosurgery and orthopedic surgery consulted.  New events last 24 hours / Subjective: No new events, no complaints today. States she is doing well overall.   Assessment & Plan:   Principal Problem:   Subdural hematoma (HCC) Active Problems:   CAD (coronary artery disease)   Chronic deep vein thrombosis (DVT) (HCC)   Diabetes mellitus with stage 3 chronic kidney disease (HCC)   Closed displaced fracture of surgical neck of left humerus   Non-sustained ventricular tachycardia (HCC)   Chronic diastolic CHF (congestive heart failure) (HCC)   CKD (chronic kidney disease), stage III   Syncope   Syncope  -Syncopal episode x 2 in the past week, history of non-sustained VT, A fib  -Cardiology consulted; signed off 10/9 -Cardiology recommend aspirin 81 mg daily, Eliquis 5 mg twice daily starting in 5 days. Resume 10/13 -Patient to follow-up with her outpatient cardiologist for outpatient implanted loop recorder   Subdural hematoma -S/p fall -Neurosurgery consulted -Repeat CT head with redistribution of trace left convexity subdural blood, now primarily along the left tentorial leaflet. No new hemorrhage. -Resume aspirin, anticoagulation in 5 days -Repeat CT head in 2 weeks to follow-up   Displaced fracture of left humeral neck -S/p fall -Orthopedic surgery consulted -CT shoulder revealed acute comminuted, impacted, displaced and angulated fracture involving the proximal shaft of the humerus with extension to the anatomical neck. No dislocation of the humeral head. -Nonsurgical treatment.  Continue left shoulder gentle PROM with sling for support and comfort.  Active motion of elbow forearm wrist and hand. -Follow-up with Dr. Marcelino Scot in 10 to 14 days  Non-sustained VT -Patient with multiple PVCs on telemetry on my exam, noted NSVT by EMS en route -Cardiology recommends outpatient loop recorder placement by her primary cardiology  Paroxysmal atrial fibrillation -Continue Cardizem -Switch to Eliquis in 5 days  CAD -S/p PCI of LAD in May 2019 -Follows with [name] in [place] to low-dose aspirin in 5 days -Continue zocor   Chronic diastolic heart failure -Euvolemic, stable at this time -Demadex discontinued due to orthostatic hypotension  Chronic kidney disease stage III -Baseline creatinine 1.1-1.48 -Stable   History of DVT -Switch to Eliquis in 5 days  DVT prophylaxis: SCDs Code Status: Full code Family Communication: Discussed with daughter over the phone Disposition Plan: SNF placement pending.  Repeat COVID test ordered.  Resume Eliquis, aspirin  10/13  Consultants:   Orthopedic surgery  Neurosurgery  Cardiology  Procedures:   None  Antimicrobials:  Anti-infectives (From admission, onward)   None       Objective: Vitals:   07/19/19 2017 07/20/19 0013 07/20/19 0433 07/20/19 0502  BP: (!) 169/71 (!) 162/61 (!) 173/62 (!) 166/70  Pulse: 72 98 82   Resp: 15 15 15    Temp: 98.1 F (36.7 C) 98.2 F (36.8 C) 98 F (36.7 C)   TempSrc: Oral Oral Oral   SpO2: 99% 97% 95%   Weight: 71.7 kg     Height:        Intake/Output Summary (Last 24 hours) at 07/20/2019 0947 Last data filed at 07/20/2019 0435 Gross per 24 hour  Intake 240 ml  Output 1000 ml  Net -760 ml   Filed Weights   07/17/19 0000 07/19/19 0500 07/19/19 2017  Weight: 73 kg 75.6 kg 71.7 kg    Examination: General exam: Appears calm and comfortable, left frontal scalp hematoma present Respiratory system: Clear to auscultation. Respiratory effort normal. Cardiovascular system: S1 & S2 heard, irregular rhythm, +murmur. No pedal edema. Gastrointestinal system: Abdomen is nondistended, soft and nontender. Normal bowel sounds heard. Central nervous system: Alert and oriented. Speech clear  Extremities: Left upper extremity in a sling Skin: No rashes, lesions or ulcers on exposed skin, + bruising of her left frontal scalp and of her left upper extremity Psychiatry: Judgement and insight appear stable. Mood & affect appropriate.   Data Reviewed: I have personally reviewed following labs and imaging studies  CBC: Recent Labs  Lab 07/16/19 0338 07/17/19 0426  WBC 10.5 6.9  NEUTROABS 8.8*  --   HGB 9.6* 8.9*  HCT 31.0* 27.7*  MCV 96.6 94.2  PLT 231 [id number]   Basic Metabolic Panel: Recent Labs  Lab 07/16/19 0338 07/17/19 0426 07/18/19 0316  NA 140 140 139  K 4.2 3.8 3.8  CL 104 103 102  CO2 22 25 25   GLUCOSE 168* 128* 115*  BUN 33* 23 25*  CREATININE 1.30* 1.10* 0.98  CALCIUM 10.4* 9.7 9.7  MG 1.9  --  1.9   GFR: Estimated Creatinine  Clearance: 41.5 mL/min (by C-G formula based on SCr of 0.98 mg/dL). Liver Function Tests: Recent Labs  Lab 07/16/19 0338  AST 18  ALT 12  ALKPHOS 127*  BILITOT 0.8  PROT 7.2  ALBUMIN 3.6   No results for input(s): LIPASE, AMYLASE in the last 168 hours. No results for input(s): AMMONIA in the last 168 hours. Coagulation Profile: No results for input(s): INR, PROTIME in the last 168 hours. Cardiac Enzymes: No results for input(s): CKTOTAL, CKMB, CKMBINDEX, TROPONINI in the last 168 hours. BNP (last 3 results) No results for input(s): PROBNP in the last 8760 hours. HbA1C: No results for input(s): HGBA1C in the last 72 hours. CBG: Recent Labs  Lab 07/18/19 0606 07/19/19 0606 07/19/19 1132 07/19/19 1627 07/20/19 0601  GLUCAP 102* 117* 146* 144* 129*   Lipid Profile: No results for input(s): CHOL, HDL, LDLCALC, TRIG, CHOLHDL, LDLDIRECT in the last 72 hours. Thyroid Function Tests: No results for input(s): TSH, T4TOTAL, FREET4, T3FREE, THYROIDAB in the last 72 hours. Anemia Panel: No results for input(s): VITAMINB12, FOLATE, FERRITIN, TIBC, IRON, RETICCTPCT in the last 72 hours. Sepsis Labs: No results for input(s): PROCALCITON, LATICACIDVEN in  the last 168 hours.  Recent Results (from the past 240 hour(s))  SARS CORONAVIRUS 2 (TAT 6-24 HRS) Nasopharyngeal Nasopharyngeal Swab     Status: None   Collection Time: 07/16/19  6:09 AM   Specimen: Nasopharyngeal Swab  Result Value Ref Range Status   SARS Coronavirus 2 NEGATIVE NEGATIVE Final    Comment: (NOTE) SARS-CoV-2 target nucleic acids are NOT DETECTED. The SARS-CoV-2 RNA is generally detectable in upper and lower respiratory specimens during the acute phase of infection. Negative results do not preclude SARS-CoV-2 infection, do not rule out co-infections with other pathogens, and should not be used as the sole basis for treatment or other patient management decisions. Negative results must be combined with clinical  observations, patient history, and epidemiological information. The expected result is Negative. Fact Sheet for Patients: SugarRoll.be Fact Sheet for Healthcare Providers: https://www.woods-mathews.com/ This test is not yet approved or cleared by the [place] FDA and  has been authorized for detection and/or diagnosis of SARS-CoV-2 by FDA under an Emergency Use Authorization (EUA). This EUA will remain  in effect (meaning this test can be used) for the duration of the COVID-19 declaration under Section 56 4(b)(1) of the Act, 21 U.S.C. section 360bbb-3(b)(1), unless the authorization is terminated or revoked sooner. Performed at [facility], [place] [address], [place], [place] [address]       Radiology Studies: No results found.    Scheduled Meds: . diltiazem  30 mg Oral BID  . simvastatin  10 mg Oral q1800  . sodium chloride flush  3 mL Intravenous Q12H   Continuous Infusions: . sodium chloride       LOS: 4 days      Time spent: 25 minutes   Dessa Phi, DO [facility] 07/20/2019, 9:47 AM   Available via Epic secure chat 7am-7pm After these hours, please refer to coverage provider listed on [email or web address]

## 2019-07-20 NOTE — TOC Progression Note (Signed)
Transition of Care [facility]) - Progression Note    Patient Details  Name: Melinda Maxwell MRN: [id number] Date of Birth: 08-20-1941  [facility] of [facility]) CM/SW [place], [place] Phone Number: 07/20/2019, 2:45 PM  Clinical Narrative:   CSW reviewed bed offers this morning, [facility] has offered a bed for patient. CSW spoke with patient's daughter, [name], to discuss offer at [facility]. [name] agreeable to accept bed offer. CSW faxed insurance authorization request to [facility] for [place]. COVID test was ordered and collected this morning. Authorization received for patient to admit to SNF when COVID test results.    Expected Discharge Plan: Skilled Nursing Facility Barriers to Discharge: Other (comment)(COVID test)  Expected Discharge Plan and Services Expected Discharge Plan: [place] In-house Referral: Clinical Social Work Discharge Planning Services: NA Post Acute Care Choice: [place] Living arrangements for the past 2 months: Single Family Home                 DME Arranged: N/A DME Agency: NA       HH Arranged: NA HH Agency: NA         Social Determinants of Health (SDOH) Interventions    Readmission Risk Interventions No flowsheet data found.

## 2019-07-20 NOTE — Care Management Important Message (Signed)
Important Message  Patient Details  Name: Melinda Maxwell MRN: [id number] Date of Birth: [birth year]   Medicare Important Message Given:  Yes     Orbie Pyo 07/20/2019, 2:29 PM

## 2019-07-21 LAB — GLUCOSE, CAPILLARY
Glucose-Capillary: 104 mg/dL — ABNORMAL HIGH (ref 70–99)
Glucose-Capillary: 195 mg/dL — ABNORMAL HIGH (ref 70–99)

## 2019-07-21 LAB — NOVEL CORONAVIRUS, NAA (HOSP ORDER, SEND-OUT TO REF LAB; TAT 18-24 HRS): SARS-CoV-2, NAA: NOT DETECTED

## 2019-07-21 MED ORDER — APIXABAN 5 MG PO TABS
5.0000 mg | ORAL_TABLET | Freq: Two times a day (BID) | ORAL | Status: DC
  Administered 2019-07-21 – 2019-07-25 (×9): 5 mg via ORAL
  Filled 2019-07-21 (×9): qty 1

## 2019-07-21 MED ORDER — DILTIAZEM HCL 30 MG PO TABS
30.0000 mg | ORAL_TABLET | Freq: Two times a day (BID) | ORAL | Status: DC
  Administered 2019-07-21 – 2019-07-25 (×8): 30 mg via ORAL
  Filled 2019-07-21 (×8): qty 1

## 2019-07-21 MED ORDER — ASPIRIN 81 MG PO CHEW
81.0000 mg | CHEWABLE_TABLET | Freq: Every day | ORAL | Status: DC
  Administered 2019-07-21 – 2019-07-25 (×5): 81 mg via ORAL
  Filled 2019-07-21 (×5): qty 1

## 2019-07-21 MED ORDER — DILTIAZEM HCL-DEXTROSE 125-5 MG/125ML-% IV SOLN (PREMIX)
5.0000 mg/h | INTRAVENOUS | Status: DC
  Administered 2019-07-21: 10:00:00 5 mg/h via INTRAVENOUS
  Filled 2019-07-21: qty 125

## 2019-07-21 MED ORDER — DILTIAZEM HCL 25 MG/5ML IV SOLN
15.0000 mg | Freq: Once | INTRAVENOUS | Status: AC
  Administered 2019-07-21: 09:00:00 15 mg via INTRAVENOUS
  Filled 2019-07-21: qty 5

## 2019-07-21 MED ORDER — METOPROLOL TARTRATE 5 MG/5ML IV SOLN
5.0000 mg | Freq: Once | INTRAVENOUS | Status: AC
  Administered 2019-07-21: 5 mg via INTRAVENOUS
  Filled 2019-07-21: qty 5

## 2019-07-21 NOTE — Progress Notes (Addendum)
Patient's HR in the 130s, 140s. Complain of mild SOB. Oxygen sat 95% RA.  EKG done, placed in chart. NP paged with order of Lopressor. Lopressor administered.

## 2019-07-21 NOTE — Progress Notes (Signed)
Physical Therapy Treatment Patient Details Name: Melinda Maxwell MRN: [id number] DOB: 16-Aug-1941 Today's Date: 07/21/2019    History of Present Illness Melinda Maxwell is an 78 y.o. female with medical history significant for coronary artery disease, chronic diastolic CHF, DVT on Xarelto, frequent PVCs, and sleep apnea, now presenting to the emergency department  - pt with left subdural hematoma and left proximal humerus fracture.    PT Comments    Pt performed transfer training with use of stedy to build confidence.  Pt is very anxious and the use of the stedy allowed her to participate more with decreased assistance.  Pt continues to be unable to gt train.  Based on her functional needs, SNF placement is most appropriate.  Plna next session for continued standing in stedy to build standing tolerance and endurance.      Follow Up Recommendations  SNF;Supervision/Assistance - 24 hour     Equipment Recommendations  None recommended by PT    Recommendations for Other Services       Precautions / Restrictions Precautions Precautions: Fall;Shoulder Type of Shoulder Precautions: Non operative/conservative Shoulder Interventions: Shoulder sling/immobilizer Precaution Comments: gentle PROM to shoulder; AROM elbow through hand Required Braces or Orthoses: (L shoulder) Restrictions Weight Bearing Restrictions: Yes LUE Weight Bearing: Non weight bearing Other Position/Activity Restrictions: left shoulder gentle PROM with sling for support and comfort and active motion of elbow, forearm, wrist, and hand.     Mobility  Bed Mobility Overal bed mobility: Needs Assistance Bed Mobility: Supine to Sit     Supine to sit: Mod assist;+2 for physical assistance     General bed mobility comments: trunk elevation and assist for BLEs.  Transfers Overall transfer level: Needs assistance Equipment used: Ambulation equipment used(stedy) Transfers: Sit to/from [facility] Sit to  Stand: Mod assist;+2 safety/equipment;From elevated surface Stand pivot transfers: (total in stedy)       General transfer comment: Bed in elevated position performed with sara stedy and following commands to pull with RUE and push through B LEs.  Pt performed from bed and bedside commode,  She was able to stand from elevated plate height with min assistance but cannot stand too long due to fatigue.  Ambulation/Gait Ambulation/Gait assistance: (NT all t/f performed in sara stedy.)           General Gait Details: unable   Stairs             Wheelchair Mobility    Modified Rankin (Stroke Patients Only)       Balance Overall balance assessment: Needs assistance   Sitting balance-Leahy Scale: Fair Sitting balance - Comments: Pt with increased sitting balance compared to yesterday     Standing balance-Leahy Scale: Poor Standing balance comment: +1 mod assistance for support in sara stedy.                            Cognition Arousal/Alertness: Awake/alert Behavior During Therapy: WFL for tasks assessed/performed                                   General Comments: Did not formally assess she was able to follow commands but at times requiring increased time and repeated commands.  She responds better to visual demonstration.      Exercises      General Comments        Pertinent Vitals/Pain Pain Assessment:  Faces Faces Pain Scale: Hurts little more Pain Location: LUE Pain Descriptors / Indicators: Aching;Guarding Pain Intervention(s): Monitored during session;Repositioned    Home Living                      Prior Function            PT Goals (current goals can now be found in the care plan section) Acute Rehab PT Goals Patient Stated Goal: to get better and stop falling PT Goal Formulation: With patient/family Potential to Achieve Goals: Good Progress towards PT goals: Progressing toward goals    Frequency     Min 3X/week      PT Plan Current plan remains appropriate    Co-evaluation              AM-PAC PT "6 Clicks" Mobility   Outcome Measure  Help needed turning from your back to your side while in a flat bed without using bedrails?: A Lot Help needed moving from lying on your back to sitting on the side of a flat bed without using bedrails?: A Lot Help needed moving to and from a bed to a chair (including a wheelchair)?: Total Help needed standing up from a chair using your arms (e.g., wheelchair or bedside chair)?: Total Help needed to walk in hospital room?: Total Help needed climbing 3-5 steps with a railing? : Total 6 Click Score: 8    End of Session Equipment Utilized During Treatment: Gait belt Activity Tolerance: Patient tolerated treatment well Patient left: in chair;with call bell/phone within reach;with chair alarm set Nurse Communication: Mobility status PT Visit Diagnosis: Repeated falls (R29.6);History of falling (Z91.81);Muscle weakness (generalized) (M62.81);Pain;Dizziness and giddiness (R42);Difficulty in walking, not elsewhere classified (R26.2) Pain - Right/Left: Left Pain - part of body: Arm     Time: [id number] PT Time Calculation (min) (ACUTE ONLY): 33 min  Charges:  $Therapeutic Activity: 23-37 mins                     Governor Rooks, PTA Acute Rehabilitation Services Pager [phone] Office [phone]     Prateek Knipple Eli Hose 07/21/2019, 3:34 PM

## 2019-07-21 NOTE — TOC Progression Note (Signed)
Transition of Care [facility]) - Progression Note    Patient Details  Name: Melinda Maxwell MRN: [id number] Date of Birth: 05-13-1941  Transition of Care [facility]) CM/SW [place], [place] Phone Number: 07/21/2019, 1:55 PM  Clinical Narrative:  CSW notified by RN of patient experiencing new medical symptoms; not stable for DC today. CSW alerted Otila Kluver with [facility] that patient would not be discharging today due to medical symptoms. CSW to follow.    Expected Discharge Plan: [place] Barriers to Discharge: Continued Medical Work up  Expected Discharge Plan and Services Expected Discharge Plan: [place] In-house Referral: Clinical Social Work Discharge Planning Services: NA Post Acute Care Choice: [place] Living arrangements for the past 2 months: Single Family Home                 DME Arranged: N/A DME Agency: NA       HH Arranged: NA HH Agency: NA         Social Determinants of Health (SDOH) Interventions    Readmission Risk Interventions No flowsheet data found.

## 2019-07-21 NOTE — Discharge Instructions (Addendum)
Subdural Hematoma  A subdural hematoma is a collection of blood between the brain and its outer covering (dura). As the amount of blood increases, pressure builds on the brain. There are two types of subdural hematomas:  Acute. This type develops shortly after a hard, direct hit to the head and causes blood to collect very quickly. This is a medical emergency. If it is not diagnosed and treated quickly, it can lead to severe brain injury or death.  Chronic. This is when bleeding develops more slowly, over weeks or months. In some cases, this type does not cause symptoms. What are the causes? This condition is caused by bleeding (hemorrhage) from a broken (ruptured) blood vessel. In most cases, a blood vessel ruptures and bleeds because of a head injury, such as from a hard, direct hit. Head injuries can happen in car accidents, falls, assaults, or while playing sports. In rare cases, a hemorrhage can happen without a known cause (spontaneously), especially if you take blood thinners (anticoagulants). What increases the risk? This condition is more likely to develop in:  Older people.  Infants.  People who take blood thinners.  People who have head injuries.  People who abuse alcohol. What are the signs or symptoms? Symptoms of this condition can vary depending on the size of the hematoma. Symptoms can be mild, severe, or life-threatening. They include:  Headaches.  Nausea or vomiting.  Changes in vision, such as double vision or loss of vision.  Changes in speech or trouble understanding what people say.  Loss of balance or trouble walking.  Weakness, numbness, or tingling in the arms or legs, especially on one side of the body.  Seizures.  Change in personality.  Increased sleepiness.  Memory loss.  Loss of consciousness.  Coma. Symptoms of acute subdural hematoma can develop over minutes or hours. Symptoms of chronic subdural hematoma may develop over weeks or  months. How is this diagnosed? This condition is diagnosed based on the results of:  A physical exam.  Tests of strength, reflexes, coordination, senses, manner of walking (gait), and facial and eye movements (neurological exam).  Imaging tests, such as an MRI or a CT scan. How is this treated? Treatment for this condition depends on the type of hematoma and how severe it is. Treatment for acute hematoma may include:  Emergency surgery to drain blood or remove a blood clot.  Medicines that help the body get rid of excess fluids (diuretics). These may help to reduce pressure in the brain.  Assisted breathing (ventilation). Treatment for chronic hematoma may include:  Observation and bed rest at the hospital.  Surgery. If you take blood thinners, you may need to stop taking them for a short time. You may also be given anti-seizure (anticonvulsant) medicine. Sometimes, no treatment is needed for chronic subdural hematoma. Follow these instructions at home: Activity  Avoid situations where you could injure your head again, such as in competitive sports, downhill snow sports, and horseback riding. Do not do these activities until your health care provider approves. ? Wear protective gear, such as a helmet, when participating in activities such as biking or contact sports.  Avoid too much visual stimulation while recovering. This means limiting how much you read and limiting your screen time on a smart phone, tablet, computer, or TV.  Rest as told by your health care provider. Rest helps the brain heal.  Try to avoid activities that cause physical or mental stress. Return to work or school as told by  your health care provider.  Do not lift anything that is heavier than 5 lb (2.3 kg), or the limit you are told, until your health care provider says that it is safe.  Do not drive, ride a bike, or use heavy machinery until your health care provider approves.  Always wear your seat belt  when you are in a motor vehicle. Alcohol use  Do not drink alcohol if your health care provider tells you not to drink.  If you drink alcohol, limit how much you use to: ? 0-1 drink a day for women. ? 0-2 drinks a day for men. General instructions  Monitor your symptoms, and ask people around you to do the same. Recovery from brain injuries varies. Talk with your health care provider about what to expect.  Take over-the-counter and prescription medicines only as told by your health care provider. Do not take blood thinners or NSAIDs unless your health care provider approves. These include aspirin, ibuprofen, naproxen, and warfarin.  Keep your home environment safe to reduce the risk of falling.  Keep all follow-up visits as told by your health care provider. This is important. Where to find more information  [employer] of Neurological Disorders and Stroke: MasterBoxes.it  American Academy of Neurology (AAN): http://keith.biz/  Brain Injury Association of [place]: www.biausa.org Get help right away if you:  Are taking blood thinners and you fall or you experience minor trauma to the head. If you take any blood thinners, even a very small injury can cause a subdural hematoma.  Have a bleeding disorder and you fall or you experience minor trauma to the head.  Develop any of the following symptoms after a head injury: ? Clear fluid draining from your nose or ears. ? Nausea or vomiting. ? Changes in speech or trouble understanding what people say. ? Seizures. ? Drowsiness or a decrease in alertness. ? Double vision. ? Numbness or inability to move (paralysis) in any part of your body. ? Difficulty walking or poor coordination. ? Difficulty thinking. ? Confusion or forgetfulness. ? Personality changes. ? Irrational or aggressive behavior. These symptoms may represent a serious problem that is an emergency. Do not wait to see if the symptoms will go away. Get medical help  right away. Call your local emergency services (911 in the [place] Do not drive yourself to the hospital. Summary  A subdural hematoma is a collection of blood between the brain and its outer covering (dura).  Treatment for this condition depends on what type of subdural hematoma you have and how severe it is.  Symptoms can vary from mild to severe to life-threatening.  Monitor your symptoms, and ask others around you to do the same. This information is not intended to replace advice given to you by your health care provider. Make sure you discuss any questions you have with your health care provider. Document Released: 08/11/2004 Document Revised: 08/25/2018 Document Reviewed: 08/25/2018 Elsevier Patient Education  2020 [place] on my medicine - ELIQUIS (apixaban)  This medication education was reviewed with me or my healthcare representative as part of my discharge preparation.  Why was Eliquis prescribed for you? Eliquis was prescribed for you to reduce the risk of a blood clot forming that can cause a stroke if you have a medical condition called atrial fibrillation (a type of irregular heartbeat).  What do You need to know about Eliquis ? Take your Eliquis TWICE DAILY - one tablet in the morning and one tablet in the evening  with or without food. If you have difficulty swallowing the tablet whole please discuss with your pharmacist how to take the medication safely.  Take Eliquis exactly as prescribed by your doctor and DO NOT stop taking Eliquis without talking to the doctor who prescribed the medication.  Stopping may increase your risk of developing a stroke.  Refill your prescription before you run out.  After discharge, you should have regular check-up appointments with your healthcare provider that is prescribing your Eliquis.  In the future your dose may need to be changed if your kidney function or weight changes by a significant amount or as you get  older.  What do you do if you miss a dose? If you miss a dose, take it as soon as you remember on the same day and resume taking twice daily.  Do not take more than one dose of ELIQUIS at the same time to make up a missed dose.  Important Safety Information A possible side effect of Eliquis is bleeding. You should call your healthcare provider right away if you experience any of the following: ? Bleeding from an injury or your nose that does not stop. ? Unusual colored urine (red or dark brown) or unusual colored stools (red or black). ? Unusual bruising for unknown reasons. ? A serious fall or if you hit your head (even if there is no bleeding).  Some medicines may interact with Eliquis and might increase your risk of bleeding or clotting while on Eliquis. To help avoid this, consult your healthcare provider or pharmacist prior to using any new prescription or non-prescription medications, including herbals, vitamins, non-steroidal anti-inflammatory drugs (NSAIDs) and supplements.  This website has more information on Eliquis (apixaban): http://www.eliquis.com/eliquis/home

## 2019-07-21 NOTE — Progress Notes (Addendum)
PROGRESS NOTE    Melinda Maxwell  [id number] DOB: April 14, 1941 DOA: 07/16/2019 PCP: [name], MD     Brief Narrative:  Melinda Guardian Masseyis an 78 y.o.femalewith medical history significant forcoronary artery disease, chronic diastolic CHF, DVT on Xarelto, frequent PVCs, and sleep apnea, now presenting to the emergency department with headache and left arm pain after a fall and syncopal episode at home.Patient reports that she was in her usual state of health last night, but fell after using the bathroom, believes that she lost consciousness, and was experiencing severe pain in the left arm and also had a headache when she regained awareness. She was able to get to a phone and call her daughter who then called EMS. Daughter provides additional history, reporting that within the past week, the patient was seated at a table to eat when she reported feeling as though she was going to pass out, and then did have a brief loss of consciousness, sliding out of the chair onto the floor. Patient has not had any chest pain associated with these events.   Work up revealed acute displaced fracture of the proximal left humerus, noncontrast head CT reveals subdural hematoma along the left frontal convexity, 3 mm in thickness, and no acute cervical spine fracture is identified on CT. Neurosurgery and orthopedic surgery consulted as well as cardiology for syncope.   New events last 24 hours / Subjective: Patient went into atrial fibrillation with RVR with heart rate up to 150 overnight.  She was given one-time dose of IV metoprolol without any change in her heart rate.  This morning, patient is seen, denies any chest pain, endorses some mild shortness of breath but remains stable on room air.  Other than her left shoulder pain, she has no complaints this morning.  Denies any heart palpitations, nausea or vomiting.  Assessment & Plan:   Principal Problem:   Subdural hematoma (HCC) Active Problems:    CAD (coronary artery disease)   Chronic deep vein thrombosis (DVT) (HCC)   Diabetes mellitus with stage 3 chronic kidney disease (HCC)   Closed displaced fracture of surgical neck of left humerus   Non-sustained ventricular tachycardia (HCC)   Chronic diastolic CHF (congestive heart failure) (HCC)   CKD (chronic kidney disease), stage III   Syncope   Syncope -Syncopal episode x 2 in the past week, history of non-sustained VT, A fib  -Cardiology consulted; signed off 10/9 -Cardiology recommend aspirin 81 mg daily, Eliquis 5 mg twice daily starting in 5 days. Resumed on 10/13 -Patient to follow-up with her outpatient cardiologist for outpatient implanted loop recorder   Paroxysmal atrial fibrillation with RVR  -Previously on Xarelto, switched to Eliquis starting 10/13 -EKG reviewed independently, reveals atrial fibrillation with RVR -Patient went into rapid ventricular rate overnight, rate up to 150.  Trialed 1 dose of IV metoprolol, IV Cardizem without improvement.  Start Cardizem drip and transferred to progressive level of care  Subdural hematoma -S/p fall -Neurosurgery consulted -Repeat CT head with redistribution of trace left convexity subdural blood, now primarily along the left tentorial leaflet. No new hemorrhage. -Resume aspirin, Eliquis after 5 days. Resumed on 10/13  -Repeat CT head in 2 weeks to follow-up   Displaced fracture of left humeral neck -S/p fall -Orthopedic surgery consulted -CT shoulder revealed acute comminuted, impacted, displaced and angulated fracture involving the proximal shaft of the humerus with extension to the anatomical neck. No dislocation of the humeral head. -Nonsurgical treatment.  Continue left shoulder gentle PROM  with sling for support and comfort.  Active motion of elbow forearm wrist and hand. -Follow-up with Dr. Marcelino Scot in 10 to 14 days  Non-sustained VT -Patient with multiple PVCs on telemetry on my exam, noted NSVT by EMS en route  -Cardiology recommends outpatient loop recorder placement by her primary cardiology  CAD -S/p PCI of LAD in May 2019 -Follows with [name] in [place] -Started aspirin 10/13  -Continue zocor   Chronic diastolic heart failure -Euvolemic, stable at this time -Demadex discontinued due to orthostatic hypotension  Chronic kidney disease stage III -Baseline creatinine 1.1-1.48 -Stable   History of DVT -Xarelton PTA. Switched to Eliquis in 5 days, started 10/13     DVT prophylaxis: Eliquis Code Status: Full code Family Communication: Discussed with daughter over the phone Disposition Plan: SNF placement pending.  Repeat COVID test negative.  Resumed Eliquis, aspirin 10/13.  Remain in hospital for better heart rate control on IV Cardizem.  Transfer to progressive level of care  Consultants:   Orthopedic surgery  Neurosurgery  Cardiology  Procedures:   None  Antimicrobials:  Anti-infectives (From admission, onward)   None       Objective: Vitals:   07/20/19 2349 07/21/19 0346 07/21/19 0617 07/21/19 0751  BP: (!) 112/47 (!) 143/82 129/90 136/80  Pulse: 84 79 (!) 150 99  Resp: 17 17 (!) 22 20  Temp: 98.9 F (37.2 C) 98.1 F (36.7 C)  98.2 F (36.8 C)  TempSrc: Oral Oral  Oral  SpO2: 93% 93% 95% 97%  Weight:      Height:        Intake/Output Summary (Last 24 hours) at 07/21/2019 0956 Last data filed at 07/21/2019 [id number] Gross per 24 hour  Intake 60 ml  Output 1000 ml  Net -940 ml   Filed Weights   07/17/19 0000 07/19/19 0500 07/19/19 2017  Weight: 73 kg 75.6 kg 71.7 kg    Examination: General exam: Appears calm and comfortable  Respiratory system: Clear to auscultation. Respiratory effort normal. Cardiovascular system: S1 & S2 heard, irregular rhythm, rate up to 150 during examination Gastrointestinal system: Abdomen is nondistended, soft and nontender. Normal bowel sounds heard. Central nervous system: Alert and oriented. Non focal exam.  Speech clear  Extremities: Left upper extremity in a sling Skin: No rashes, lesions or ulcers on exposed skin  Psychiatry: Judgement and insight appear stable. Mood & affect appropriate.   Data Reviewed: I have personally reviewed following labs and imaging studies  CBC: Recent Labs  Lab 07/16/19 0338 07/17/19 0426  WBC 10.5 6.9  NEUTROABS 8.8*  --   HGB 9.6* 8.9*  HCT 31.0* 27.7*  MCV 96.6 94.2  PLT 231 [id number]   Basic Metabolic Panel: Recent Labs  Lab 07/16/19 0338 07/17/19 0426 07/18/19 0316  NA 140 140 139  K 4.2 3.8 3.8  CL 104 103 102  CO2 22 25 25   GLUCOSE 168* 128* 115*  BUN 33* 23 25*  CREATININE 1.30* 1.10* 0.98  CALCIUM 10.4* 9.7 9.7  MG 1.9  --  1.9   GFR: Estimated Creatinine Clearance: 41.5 mL/min (by C-G formula based on SCr of 0.98 mg/dL). Liver Function Tests: Recent Labs  Lab 07/16/19 0338  AST 18  ALT 12  ALKPHOS 127*  BILITOT 0.8  PROT 7.2  ALBUMIN 3.6   No results for input(s): LIPASE, AMYLASE in the last 168 hours. No results for input(s): AMMONIA in the last 168 hours. Coagulation Profile: No results for input(s): INR, PROTIME in the last  168 hours. Cardiac Enzymes: No results for input(s): CKTOTAL, CKMB, CKMBINDEX, TROPONINI in the last 168 hours. BNP (last 3 results) No results for input(s): PROBNP in the last 8760 hours. HbA1C: No results for input(s): HGBA1C in the last 72 hours. CBG: Recent Labs  Lab 07/18/19 0606 07/19/19 0606 07/19/19 1132 07/19/19 1627 07/20/19 0601  GLUCAP 102* 117* 146* 144* 129*   Lipid Profile: No results for input(s): CHOL, HDL, LDLCALC, TRIG, CHOLHDL, LDLDIRECT in the last 72 hours. Thyroid Function Tests: No results for input(s): TSH, T4TOTAL, FREET4, T3FREE, THYROIDAB in the last 72 hours. Anemia Panel: No results for input(s): VITAMINB12, FOLATE, FERRITIN, TIBC, IRON, RETICCTPCT in the last 72 hours. Sepsis Labs: No results for input(s): PROCALCITON, LATICACIDVEN in the last 168 hours.   Recent Results (from the past 240 hour(s))  SARS CORONAVIRUS 2 (TAT 6-24 HRS) Nasopharyngeal Nasopharyngeal Swab     Status: None   Collection Time: 07/16/19  6:09 AM   Specimen: Nasopharyngeal Swab  Result Value Ref Range Status   SARS Coronavirus 2 NEGATIVE NEGATIVE Final    Comment: (NOTE) SARS-CoV-2 target nucleic acids are NOT DETECTED. The SARS-CoV-2 RNA is generally detectable in upper and lower respiratory specimens during the acute phase of infection. Negative results do not preclude SARS-CoV-2 infection, do not rule out co-infections with other pathogens, and should not be used as the sole basis for treatment or other patient management decisions. Negative results must be combined with clinical observations, patient history, and epidemiological information. The expected result is Negative. Fact Sheet for Patients: SugarRoll.be Fact Sheet for Healthcare Providers: https://www.woods-mathews.com/ This test is not yet approved or cleared by the [place] FDA and  has been authorized for detection and/or diagnosis of SARS-CoV-2 by FDA under an Emergency Use Authorization (EUA). This EUA will remain  in effect (meaning this test can be used) for the duration of the COVID-19 declaration under Section 56 4(b)(1) of the Act, 21 U.S.C. section 360bbb-3(b)(1), unless the authorization is terminated or revoked sooner. Performed at [facility], [place] [address] [place], [place] [address]   Novel Coronavirus, NAA (Hosp order, Send-out to Ref Lab; TAT 18-24 hrs     Status: None   Collection Time: 07/20/19  4:26 PM   Specimen: Nasopharyngeal Swab; Respiratory  Result Value Ref Range Status   SARS-CoV-2, NAA NOT DETECTED NOT DETECTED Final    Comment: (NOTE) This nucleic acid amplification test was developed and its performance characteristics determined by [employer]. Nucleic acid amplification tests include PCR and TMA.  This test has not been FDA cleared or approved. This test has been authorized by FDA under an Emergency Use Authorization (EUA). This test is only authorized for the duration of time the declaration that circumstances exist justifying the authorization of the emergency use of in vitro diagnostic tests for detection of SARS-CoV-2 virus and/or diagnosis of COVID-19 infection under section 564(b)(1) of the Act, 21 U.S.C. [id number]) (1), unless the authorization is terminated or revoked sooner. When diagnostic testing is negative, the possibility of a false negative result should be considered in the context of a patient's recent exposures and the presence of clinical signs and symptoms consistent with COVID-19. An individual without symptoms of COVID- 19 and who is not shedding SARS-CoV-2 vi rus would expect to have a negative (not detected) result in this assay. Performed At: [facility] [address] [place], [place] [id number] Rush Farmer MD [id number]    [place]  Final    Comment: Performed  at [facility], [place] [address], [place], [place] [address]      Radiology Studies: No results found.    Scheduled Meds: . simvastatin  10 mg Oral q1800  . sodium chloride flush  3 mL Intravenous Q12H   Continuous Infusions: . sodium chloride    . diltiazem (CARDIZEM) infusion       LOS: 5 days      Time spent: 35 minutes   Dessa Phi, DO [facility] 07/21/2019, 9:56 AM   Available via Epic secure chat 7am-7pm After these hours, please refer to coverage provider listed on [email or web address]

## 2019-07-22 ENCOUNTER — Inpatient Hospital Stay (HOSPITAL_COMMUNITY): Payer: Medicare Other

## 2019-07-22 LAB — URINALYSIS, ROUTINE W REFLEX MICROSCOPIC
Bilirubin Urine: NEGATIVE
Glucose, UA: NEGATIVE mg/dL
Hgb urine dipstick: NEGATIVE
Ketones, ur: NEGATIVE mg/dL
Nitrite: NEGATIVE
Protein, ur: NEGATIVE mg/dL
Specific Gravity, Urine: 1.018 (ref 1.005–1.030)
pH: 6 (ref 5.0–8.0)

## 2019-07-22 LAB — CBC
HCT: 27.2 % — ABNORMAL LOW (ref 36.0–46.0)
Hemoglobin: 8.7 g/dL — ABNORMAL LOW (ref 12.0–15.0)
MCH: 30.9 pg (ref 26.0–34.0)
MCHC: 32 g/dL (ref 30.0–36.0)
MCV: 96.5 fL (ref 80.0–100.0)
Platelets: 213 10*3/uL (ref 150–400)
RBC: 2.82 MIL/uL — ABNORMAL LOW (ref 3.87–5.11)
RDW: 15.9 % — ABNORMAL HIGH (ref 11.5–15.5)
WBC: 5.9 10*3/uL (ref 4.0–10.5)
nRBC: 0 % (ref 0.0–0.2)

## 2019-07-22 LAB — BASIC METABOLIC PANEL
Anion gap: 9 (ref 5–15)
BUN: 28 mg/dL — ABNORMAL HIGH (ref 8–23)
CO2: 19 mmol/L — ABNORMAL LOW (ref 22–32)
Calcium: 9.3 mg/dL (ref 8.9–10.3)
Chloride: 111 mmol/L (ref 98–111)
Creatinine, Ser: 0.94 mg/dL (ref 0.44–1.00)
GFR calc Af Amer: >60 mL/min (ref >60)
GFR calc non Af Amer: 57 mL/min — ABNORMAL LOW (ref >60)
Glucose, Bld: 125 mg/dL — ABNORMAL HIGH (ref 70–99)
Potassium: 4 mmol/L (ref 3.5–5.1)
Sodium: 139 mmol/L (ref 135–145)

## 2019-07-22 LAB — GLUCOSE, CAPILLARY
Glucose-Capillary: 118 mg/dL — ABNORMAL HIGH (ref 70–99)
Glucose-Capillary: 137 mg/dL — ABNORMAL HIGH (ref 70–99)
Glucose-Capillary: 114 mg/dL — ABNORMAL HIGH (ref 70–99)

## 2019-07-22 MED ORDER — SODIUM CHLORIDE 0.9 % IV SOLN
1.0000 g | INTRAVENOUS | Status: DC
  Administered 2019-07-22: 1 g via INTRAVENOUS
  Filled 2019-07-22: qty 10

## 2019-07-22 MED ORDER — HYDRALAZINE HCL 10 MG PO TABS
10.0000 mg | ORAL_TABLET | Freq: Three times a day (TID) | ORAL | Status: DC
  Administered 2019-07-22 – 2019-07-23 (×3): 10 mg via ORAL
  Filled 2019-07-22 (×3): qty 1

## 2019-07-22 MED ORDER — MECLIZINE HCL 12.5 MG PO TABS: 12.5000 mg | ORAL_TABLET | Freq: Two times a day (BID) | ORAL | Status: DC | PRN

## 2019-07-22 NOTE — Plan of Care (Signed)
  Problem: Health Behavior/Discharge Planning: Goal: Ability to manage health-related needs will improve Outcome: Progressing   Problem: Clinical Measurements: Goal: Ability to maintain clinical measurements within normal limits will improve Outcome: Progressing Goal: Will remain free from infection Outcome: Progressing Goal: Diagnostic test results will improve Outcome: Progressing Goal: Respiratory complications will improve Outcome: Progressing   Problem: Activity: Goal: Risk for activity intolerance will decrease Outcome: Progressing   Problem: Nutrition: Goal: Adequate nutrition will be maintained Outcome: Progressing   Problem: Coping: Goal: Level of anxiety will decrease Outcome: Progressing   Problem: Elimination: Goal: Will not experience complications related to bowel motility Outcome: Progressing   Problem: Pain Managment: Goal: General experience of comfort will improve Outcome: Progressing   Problem: Safety: Goal: Ability to remain free from injury will improve Outcome: Progressing   Problem: Skin Integrity: Goal: Risk for impaired skin integrity will decrease Outcome: Progressing   Problem: Education: Goal: Knowledge of disease or condition will improve Outcome: Progressing Goal: Knowledge of secondary prevention will improve Outcome: Progressing Goal: Individualized Educational Video(s) Outcome: Progressing   Ival Bible, BSN, RN

## 2019-07-22 NOTE — TOC Progression Note (Signed)
Transition of Care [facility]) - Progression Note    Patient Details  Name: Melinda Maxwell MRN: [id number] Date of Birth: June 17, 1941  Transition of Care [facility]) CM/SW [place], [place] Phone Number: 07/22/2019, 4:19 PM  Clinical Narrative:   CSW alerted by MD that patient still not stable for discharge today. Updates were due to [facility] today, CSW reached out to [facility] to inform them that patient was still in the hospital, to ask if an updated auth needed to be started or if clinicals could just be faxed to extend the authorization. Authorization can be extended if updates from the past 48 hours are sent to [facility]. CSW printed out updates and faxed to [facility] for review.   CSW also reached out to [facility] to update that patient could hopefully be ready tomorrow, and that CSW sent in updates to [facility] for auth request. [facility] is now on hold for admissions as they are awaiting some COVID test results, but [facility] will update CSW tomorrow if that changes. CSW to follow.    Expected Discharge Plan: [place] Barriers to Discharge: Continued Medical Work up  Expected Discharge Plan and Services Expected Discharge Plan: [place] In-house Referral: Clinical Social Work Discharge Planning Services: NA Post Acute Care Choice: [place] Living arrangements for the past 2 months: Single Family Home                 DME Arranged: N/A DME Agency: NA       HH Arranged: NA HH Agency: NA         Social Determinants of Health (SDOH) Interventions    Readmission Risk Interventions No flowsheet data found.

## 2019-07-22 NOTE — Progress Notes (Signed)
Physical Therapy Treatment Patient Details Name: Melinda Maxwell MRN: [id number] DOB: 1941/05/29 Today's Date: 07/22/2019    History of Present Illness Melinda Maxwell is an 78 y.o. female with medical history significant for coronary artery disease, chronic diastolic CHF, DVT on Xarelto, frequent PVCs, and sleep apnea, now presenting to the emergency department  - pt with left subdural hematoma and left proximal humerus fracture.    PT Comments    Pt performed LE exercises, and multiple transfers to and from seated surfaces.  Pt required cues for transfer and constant redirection to situation.  She is very confused and asking to go home.  Pt also presents with complaints of dizziness again with positional changes.   Orthostatic vitals performed: 170/63 in supine,  153/67 in sitting and  148/75 in standing.     Follow Up Recommendations  SNF;Supervision/Assistance - 24 hour     Equipment Recommendations  None recommended by PT    Recommendations for Other Services       Precautions / Restrictions Precautions Precautions: Fall;Shoulder Type of Shoulder Precautions: Non operative/conservative Shoulder Interventions: Shoulder sling/immobilizer Precaution Comments: gentle PROM to shoulder; AROM elbow through hand Restrictions Weight Bearing Restrictions: Yes LUE Weight Bearing: Non weight bearing Other Position/Activity Restrictions: left shoulder gentle PROM with sling for support and comfort and active motion of elbow, forearm, wrist, and hand.     Mobility  Bed Mobility Overal bed mobility: Needs Assistance Bed Mobility: Supine to Sit;Sit to Supine     Supine to sit: Mod assist     General bed mobility comments: Pt continues to required assistance to advance LEs to edge of bed and to elevate trunk into a seated position.  Transfers Overall transfer level: Needs assistance Equipment used: Ambulation equipment used(sara stedy.) Transfers: Sit to/from [employer] Sit to Stand: Min assist;Mod assist Stand pivot transfers: (total in sara stedy)       General transfer comment: min to moderated assistance.  The only time she required mod was to rise from the bed side commode.  Ambulation/Gait Ambulation/Gait assistance: (NT)               Stairs             Wheelchair Mobility    Modified Rankin (Stroke Patients Only)       Balance Overall balance assessment: Needs assistance   Sitting balance-Leahy Scale: Fair Sitting balance - Comments: Pt with increased sitting balance compared to yesterday     Standing balance-Leahy Scale: Poor Standing balance comment: +1 mod assistance for support in sara stedy.                            Cognition Arousal/Alertness: Awake/alert Behavior During Therapy: WFL for tasks assessed/performed Overall Cognitive Status: Impaired/Different from baseline Area of Impairment: Memory;Problem solving;Awareness;Orientation                 Orientation Level: Disoriented to;Place;Time   Memory: Decreased short-term memory     Awareness: Emergent Problem Solving: Difficulty sequencing;Requires verbal cues General Comments: Pt reports she wants to get dressed to go home with her daughter.  She wanted me to call her daughter and send her directions on how to get here.  She said." I promise I'll come back tomorrow."  Pt edcuated she was hopsitalized and she needed to stay until the dr. cleared her.      Exercises General Exercises - Lower Extremity Ankle Circles/Pumps: AROM;Both;10 reps;Supine  Long Arc Quad: AROM;Both;10 reps;Seated Heel Slides: AROM;Both;10 reps;Supine Hip ABduction/ADduction: AROM;Both;10 reps;Supine Hip Flexion/Marching: AROM;Both;10 reps;Seated    General Comments        Pertinent Vitals/Pain Pain Assessment: Faces Faces Pain Scale: Hurts a little bit Pain Location: LUE and back Pain Descriptors / Indicators: Aching;Guarding Pain  Intervention(s): Monitored during session;Repositioned    Home Living                      Prior Function            PT Goals (current goals can now be found in the care plan section) Acute Rehab PT Goals Patient Stated Goal: to get better and stop falling PT Goal Formulation: With patient/family Potential to Achieve Goals: Good Progress towards PT goals: Progressing toward goals    Frequency    Min 3X/week      PT Plan Current plan remains appropriate    Co-evaluation              AM-PAC PT "6 Clicks" Mobility   Outcome Measure  Help needed turning from your back to your side while in a flat bed without using bedrails?: A Lot Help needed moving from lying on your back to sitting on the side of a flat bed without using bedrails?: A Lot Help needed moving to and from a bed to a chair (including a wheelchair)?: A Lot Help needed standing up from a chair using your arms (e.g., wheelchair or bedside chair)?: A Lot Help needed to walk in hospital room?: Total Help needed climbing 3-5 steps with a railing? : Total 6 Click Score: 10    End of Session Equipment Utilized During Treatment: Gait belt Activity Tolerance: Patient tolerated treatment well Patient left: in chair;with call bell/phone within reach;with chair alarm set Nurse Communication: Mobility status PT Visit Diagnosis: Repeated falls (R29.6);History of falling (Z91.81);Muscle weakness (generalized) (M62.81);Pain;Dizziness and giddiness (R42);Difficulty in walking, not elsewhere classified (R26.2) Pain - Right/Left: Left Pain - part of body: Arm     Time: 1549-1630 PT Time Calculation (min) (ACUTE ONLY): 41 min  Charges:  $Therapeutic Exercise: 8-22 mins $Therapeutic Activity: 23-37 mins                     Governor Rooks, PTA Acute Rehabilitation Services Pager [phone] Office [phone]     Keyvon Herter Eli Hose 07/22/2019, 5:37 PM

## 2019-07-22 NOTE — Plan of Care (Signed)
Progressing towards goals

## 2019-07-22 NOTE — Progress Notes (Signed)
Pt demonstrating some confusion this shift. Pt is aware that she has an UTI and that UTI's cause confusion. She speaks of leaving tonight and being at home in another house on her block.

## 2019-07-22 NOTE — Progress Notes (Signed)
[name], female   DOB: December 10, 1940, 78 y.o.   MRN: [id number] C/o dizziness and sholuder pain, but awake alert and pleasant. wil repeat head CT, if lloks good then ok to resume anti coag.

## 2019-07-22 NOTE — Progress Notes (Signed)
Physician notified of blood pressure after scheduled hydralazine.

## 2019-07-22 NOTE — Progress Notes (Signed)
PROGRESS NOTE  Melinda Maxwell [id number] DOB: [birth year] DOA: 07/16/2019 PCP: [name], MD  HPI/Recap of past 24 hours: Melinda Maxwell [id number] 78 y.o.femalewith medical history significant forcoronary artery disease, chronic diastolic CHF, DVT on Xarelto, frequent PVCs, and sleep apnea, now presenting to the emergency department with headache and left arm pain after a fall and syncopal episodeat home.Patient reports that she was in her usual state of health last night, but fell after using the bathroom, believes that she lost consciousness, and was experiencing severe pain in the left arm and also had a headache when she regained awareness. She was able to get to a phone and call her daughter who then called EMS. Daughter provides additional history, reporting that within the past week, the patient was seated at a table to eat when she reported feeling as though she was going to pass out, and then did have a brief loss of consciousness, sliding out of the chair onto the floor. Patient has not had any chest pain associated with these events.   Work up revealed acute displaced fracture of the proximal left humerus, noncontrast head CT reveals subdural hematoma along the left frontal convexity, 3 mm in thickness, and no acute cervical spine fracture is identified on CT.Neurosurgery and orthopedic surgeryconsulted as well as cardiology for syncope.   07/22/19: Patient was seen and examined at her bedside this morning she reports dizziness with feeling of room spinning around her.  No headache associated with her dizziness.  CT head no contrast has been ordered by neurosurgery to further assess.  She is on Eliquis.   Assessment/Plan: Principal Problem:   Subdural hematoma (HCC) Active Problems:   CAD (coronary artery disease)   Chronic deep vein thrombosis (DVT) (HCC)   Diabetes mellitus with stage 3 chronic kidney disease (HCC)   Closed displaced fracture of surgical neck of  left humerus   Non-sustained ventricular tachycardia (HCC)   Chronic diastolic CHF (congestive heart failure) (HCC)   CKD (chronic kidney disease), stage III   Syncope   Recurrent syncope X2 in the last 1 week History of nonsustained V. tach and P. A. Fib Seen by cardiology, signed off on 07/17/2019. Cardiology recommended aspirin 81 mg daily, Eliquis 5 mg twice daily starting on 07/21/2019.  Dizziness, rule out central etiology CT head no contrast ordered to further assess Obtain orthostatic vital signs Continue neurochecks Continue to closely monitor on telemetry Fall precs  Traumatic subdural hematoma post fall Seen by neurosurgery No new hemorrhage on repeated CT head No midline shift. Aspirin and Eliquis resumed on 07/21/2019 Planned repeat CT head no contrast on 07/22/19 due to sudden onset dizziness to r/o worsening subdural hematoma  Paroxysmal atrial fibrillation with RVR  -Previously on Xarelto, switched to Eliquis starting 10/13 -EKG reviewed independently, reveals atrial fibrillation with RVR -Patient went into rapid ventricular rate overnight, rate up to 150 on overnight of 07/21/19.  Rate controlled on po diltiazem C/w close monitoring on telemetry  Sinus bradycardia, iatrogenic in the setting of beta blockers HR in the high 40's to id 50's Continue to closely monitor on telemetry  Displaced fracture of left humeral neck -S/p fall -Orthopedic surgery consulted -CT shoulder revealed acute comminuted, impacted, displaced and angulated fracture involving the proximal shaft of the humerus with extension to the anatomical neck. No dislocation of the humeral head. -Nonsurgical treatment.  Continue left shoulder gentle PROM with sling for support and comfort.  Active motion of elbow forearm wrist and hand. -Follow-up with  Dr. Marcelino Scot in 10 to 14 days  Non-sustained VT -Patient with multiple PVCs on telemetry on my exam, noted NSVT by EMS en route -Cardiology  recommends outpatient loop recorder placement by her primary cardiology  CAD -S/p PCIof LAD inMay 2019 -Follows withDr. Nehemiah Massed in [place] -Started aspirin 10/13  -Continue zocor  Chronic diastolic heart failure -Euvolemic, stable at this time -Demadex discontinued due to orthostatic hypotension  Chronic kidney disease stage III -Baseline creatinine1.1-1.48 -Stable   History of DVT -Xarelton PTA. Switched to Eliquis in 5 days, started 10/13  Pyuria U/A done on 07/22/19  showed pyuria Unclear if related to her dizziness Obtain urine cx and treat empirically with rocephin Awaiting ID and sensitivities to narrow abx  Physical debility/ambulatory dysfunction PT recs SNF CSW assisting w placement      DVT prophylaxis: Eliquis Code Status: Full code Family Communication: Discussed with daughter over the phone  Disposition Plan: SNF placement pending.  Repeat COVID test negative.  Resumed Eliquis, aspirin 10/13.  Remain in hospital for better heart rate control on IV Cardizem.  Transfer to progressive level of care  Consultants:   Orthopedic surgery  Neurosurgery  Cardiology  Procedures:   None  Antimicrobials:      Anti-infectives (From admission, onward)       None          Objective: Vitals:   07/21/19 2343 07/22/19 0256 07/22/19 0359 07/22/19 0840  BP: (!) 146/78  (!) 146/65 (!) 158/66  Pulse: 66  (!) 45 (!) 54  Resp: 17  14 20   Temp: 98.7 F (37.1 C)  97.9 F (36.6 C) (!) 97.5 F (36.4 C)  TempSrc: Axillary  Oral Oral  SpO2:   94% 96%  Weight:  72.6 kg 74.7 kg   Height:        Intake/Output Summary (Last 24 hours) at 07/22/2019 1104 Last data filed at 07/22/2019 0443 Gross per 24 hour  Intake -  Output 205 ml  Net -205 ml   Filed Weights   07/19/19 2017 07/22/19 0256 07/22/19 0359  Weight: 71.7 kg 72.6 kg 74.7 kg    Exam:  . General: 78 y.o. year-old female well developed well nourished in no acute  distress.  Alert and oriented x3.  . Cardiovascular: Regular rate and rhythm with no rubs or gallops.  No thyromegaly or JVD noted.   Marland Kitchen Respiratory: Clear to auscultation with no wheezes or rales. Good inspiratory effort. . Abdomen: Soft nontender nondistended with normal bowel sounds x4 quadrants. . Musculoskeletal: Trace lower extremity edema. 2/4 pulses in all 4 extremities. Marland Kitchen Psychiatry: Mood is appropriate for condition and setting   Data Reviewed: CBC: Recent Labs  Lab 07/16/19 0338 07/17/19 0426 07/22/19 0410  WBC 10.5 6.9 5.9  NEUTROABS 8.8*  --   --   HGB 9.6* 8.9* 8.7*  HCT 31.0* 27.7* 27.2*  MCV 96.6 94.2 96.5  PLT 231 207 [id number]   Basic Metabolic Panel: Recent Labs  Lab 07/16/19 0338 07/17/19 0426 07/18/19 0316 07/22/19 0410  NA 140 140 139 139  K 4.2 3.8 3.8 4.0  CL 104 103 102 111  CO2 22 25 25  19*  GLUCOSE 168* 128* 115* 125*  BUN 33* 23 25* 28*  CREATININE 1.30* 1.10* 0.98 0.94  CALCIUM 10.4* 9.7 9.7 9.3  MG 1.9  --  1.9  --    GFR: Estimated Creatinine Clearance: 44.2 mL/min (by C-G formula based on SCr of 0.94 mg/dL). Liver Function Tests: Recent Labs  Lab 07/16/19  0338  AST 18  ALT 12  ALKPHOS 127*  BILITOT 0.8  PROT 7.2  ALBUMIN 3.6   No results for input(s): LIPASE, AMYLASE in the last 168 hours. No results for input(s): AMMONIA in the last 168 hours. Coagulation Profile: No results for input(s): INR, PROTIME in the last 168 hours. Cardiac Enzymes: No results for input(s): CKTOTAL, CKMB, CKMBINDEX, TROPONINI in the last 168 hours. BNP (last 3 results) No results for input(s): PROBNP in the last 8760 hours. HbA1C: No results for input(s): HGBA1C in the last 72 hours. CBG: Recent Labs  Lab 07/19/19 1627 07/20/19 0601 07/21/19 1127 07/21/19 1600 07/22/19 0613  GLUCAP 144* 129* 104* 195* 114*   Lipid Profile: No results for input(s): CHOL, HDL, LDLCALC, TRIG, CHOLHDL, LDLDIRECT in the last 72 hours. Thyroid Function Tests: No  results for input(s): TSH, T4TOTAL, FREET4, T3FREE, THYROIDAB in the last 72 hours. Anemia Panel: No results for input(s): VITAMINB12, FOLATE, FERRITIN, TIBC, IRON, RETICCTPCT in the last 72 hours. Urine analysis:    Component Value Date/Time   COLORURINE YELLOW 07/22/2019 0451   APPEARANCEUR CLEAR 07/22/2019 0451   LABSPEC 1.018 07/22/2019 0451   PHURINE 6.0 07/22/2019 0451   GLUCOSEU NEGATIVE 07/22/2019 0451   HGBUR NEGATIVE 07/22/2019 0451   BILIRUBINUR NEGATIVE 07/22/2019 0451   KETONESUR NEGATIVE 07/22/2019 0451   PROTEINUR NEGATIVE 07/22/2019 0451   NITRITE NEGATIVE 07/22/2019 0451   LEUKOCYTESUR LARGE (A) 07/22/2019 0451   Sepsis Labs: @LABRCNTIP (procalcitonin:4,lacticidven:4)  ) Recent Results (from the past 240 hour(s))  SARS CORONAVIRUS 2 (TAT 6-24 HRS) Nasopharyngeal Nasopharyngeal Swab     Status: None   Collection Time: 07/16/19  6:09 AM   Specimen: Nasopharyngeal Swab  Result Value Ref Range Status   SARS Coronavirus 2 NEGATIVE NEGATIVE Final    Comment: (NOTE) SARS-CoV-2 target nucleic acids are NOT DETECTED. The SARS-CoV-2 RNA is generally detectable in upper and lower respiratory specimens during the acute phase of infection. Negative results do not preclude SARS-CoV-2 infection, do not rule out co-infections with other pathogens, and should not be used as the sole basis for treatment or other patient management decisions. Negative results must be combined with clinical observations, patient history, and epidemiological information. The expected result is Negative. Fact Sheet for Patients: SugarRoll.be Fact Sheet for Healthcare Providers: https://www.woods-mathews.com/ This test is not yet approved or cleared by the [place] FDA and  has been authorized for detection and/or diagnosis of SARS-CoV-2 by FDA under an Emergency Use Authorization (EUA). This EUA will remain  in effect (meaning this test can be used)  for the duration of the COVID-19 declaration under Section 56 4(b)(1) of the Act, 21 U.S.C. section 360bbb-3(b)(1), unless the authorization is terminated or revoked sooner. Performed at [facility], [place] [address], [place], [place] [address]   Novel Coronavirus, NAA (Hosp order, Send-out to Ref Lab; TAT 18-24 hrs     Status: None   Collection Time: 07/20/19  4:26 PM   Specimen: Nasopharyngeal Swab; Respiratory  Result Value Ref Range Status   SARS-CoV-2, NAA NOT DETECTED NOT DETECTED Final    Comment: (NOTE) This nucleic acid amplification test was developed and its performance characteristics determined by [employer]. Nucleic acid amplification tests include PCR and TMA. This test has not been FDA cleared or approved. This test has been authorized by FDA under an Emergency Use Authorization (EUA). This test is only authorized for the duration of time the declaration that circumstances exist justifying the authorization of the emergency use of  in vitro diagnostic tests for detection of SARS-CoV-2 virus and/or diagnosis of COVID-19 infection under section 564(b)(1) of the Act, 21 U.S.C. [id number]) (1), unless the authorization is terminated or revoked sooner. When diagnostic testing is negative, the possibility of a false negative result should be considered in the context of a patient's recent exposures and the presence of clinical signs and symptoms consistent with COVID-19. An individual without symptoms of COVID- 19 and who is not shedding SARS-CoV-2 vi rus would expect to have a negative (not detected) result in this assay. Performed At: [facility] [address] [place], [place] [id number] Rush Farmer MD [id number]    [place]  Final    Comment: Performed at [facility], [place] [address], [place],  [address]      Studies: No results found.  Scheduled Meds: . apixaban  5 mg Oral BID  . aspirin   81 mg Oral Daily  . diltiazem  30 mg Oral BID  . simvastatin  10 mg Oral q1800  . sodium chloride flush  3 mL Intravenous Q12H    Continuous Infusions: . sodium chloride       LOS: 6 days     Kayleen Memos, MD [facility] Pager [phone]  If 7PM-7AM, please contact night-coverage [email or web address] Password [email or web address] 07/22/2019, 11:04 AM

## 2019-07-23 LAB — URINE CULTURE

## 2019-07-23 LAB — GLUCOSE, CAPILLARY: Glucose-Capillary: 114 mg/dL — ABNORMAL HIGH (ref 70–99)

## 2019-07-23 MED ORDER — HYDRALAZINE HCL 10 MG PO TABS: 10.0000 mg | ORAL_TABLET | Freq: Three times a day (TID) | ORAL | 0 refills | Status: DC

## 2019-07-23 MED ORDER — MECLIZINE HCL 12.5 MG PO TABS: 12.5000 mg | ORAL_TABLET | Freq: Two times a day (BID) | ORAL | 0 refills | Status: DC | PRN

## 2019-07-23 MED ORDER — APIXABAN 5 MG PO TABS: 5.0000 mg | ORAL_TABLET | Freq: Two times a day (BID) | ORAL | 0 refills | Status: DC

## 2019-07-23 MED ORDER — ASPIRIN 81 MG PO CHEW: 81.0000 mg | CHEWABLE_TABLET | Freq: Every day | ORAL | 0 refills | Status: DC

## 2019-07-23 MED ORDER — HYDRALAZINE HCL 25 MG PO TABS
25.0000 mg | ORAL_TABLET | Freq: Three times a day (TID) | ORAL | Status: DC
  Administered 2019-07-23: 12:00:00 25 mg via ORAL
  Filled 2019-07-23: qty 1

## 2019-07-23 MED ORDER — HYDRALAZINE HCL 10 MG PO TABS
10.0000 mg | ORAL_TABLET | Freq: Three times a day (TID) | ORAL | Status: DC
  Administered 2019-07-23 – 2019-07-25 (×5): 10 mg via ORAL
  Filled 2019-07-23 (×5): qty 1

## 2019-07-23 MED ORDER — FOSFOMYCIN TROMETHAMINE 3 G PO PACK
3.0000 g | PACK | Freq: Once | ORAL | Status: AC
  Administered 2019-07-23: 15:00:00 3 g via ORAL
  Filled 2019-07-23 (×3): qty 3

## 2019-07-23 MED ORDER — HYDRALAZINE HCL 25 MG PO TABS: 25.0000 mg | ORAL_TABLET | Freq: Three times a day (TID) | ORAL | 0 refills | Status: DC

## 2019-07-23 NOTE — Plan of Care (Signed)
Progressing towards goals

## 2019-07-23 NOTE — Progress Notes (Signed)
[name], female   DOB: [birth year], 78 y.o.   MRN: [id number] CT shows complete resolution of extra-axial blood.  She can resume her anticoagulants.  We will sign off now.  Please call for any further questions.  No need for neurosurgical follow-up

## 2019-07-23 NOTE — Progress Notes (Signed)
Physical Therapy Treatment Patient Details Name: Melinda Maxwell MRN: [id number] DOB: 10/21/1940 Today's Date: 07/23/2019    History of Present Illness Melinda Maxwell is an 78 y.o. female with medical history significant for coronary artery disease, chronic diastolic CHF, DVT on Xarelto, frequent PVCs, and sleep apnea, now presenting to the emergency department  - pt with left subdural hematoma and left proximal humerus fracture.    PT Comments    Pt performed gt training for this 1st time this admission.  She initially had a posterior lean but as gt progressed this leveled off .  Pt continues to be confused and required reorientation throughout session.  She performed increased activity but as activity progressed she did complain of dizziness.  I performed orthostatics pre tx and assessed BP after complaints of dizziness.    BP: Supine - 148/67 Sitting- 160/75 Standing - 154/98 After complaints of dizziness during session -  129/88    Follow Up Recommendations  SNF;Supervision/Assistance - 24 hour     Equipment Recommendations  None recommended by PT    Recommendations for Other Services       Precautions / Restrictions Precautions Precautions: Fall;Shoulder Type of Shoulder Precautions: Non operative/conservative Shoulder Interventions: Shoulder sling/immobilizer Precaution Comments: gentle PROM to shoulder; AROM elbow through hand Required Braces or Orthoses: (L shoulder) Restrictions Weight Bearing Restrictions: Yes LUE Weight Bearing: Non weight bearing Other Position/Activity Restrictions: left shoulder gentle PROM with sling for support and comfort and active motion of elbow, forearm, wrist, and hand.     Mobility  Bed Mobility Overal bed mobility: Needs Assistance Bed Mobility: Supine to Sit     Supine to sit: Min assist     General bed mobility comments: Decreased assistance to mobilize to edge of bed.  Pt able to follow commands to move B LEs to edge of bed  and min assistance to elevate trunk into sitting.  Once in sitting she did require moderate assistance to move to edge of bed.  Transfers Overall transfer level: Needs assistance Equipment used: None(face to face transfer with R hand support.) Transfers: Sit to/from [facility] Sit to Stand: Mod assist Stand pivot transfers: Mod assist       General transfer comment: Pt required moderated assistance to boost into standing and to elevate trunk into improved posture.  She presents with posterior lean and required assistance and VCs for weight shifting forward.  Ambulation/Gait Ambulation/Gait assistance: Mod assist;+2 safety/equipment(for close chair follow.) Gait Distance (Feet): 8 Feet Assistive device: 1 person hand held assist(Face to Face assist to walk forward.) Gait Pattern/deviations: Leaning posteriorly;Decreased stride length;Shuffle;Step-to pattern;Trunk flexed     General Gait Details: Pt required cues for lateral weight shifting and VCs to progress steps forward.  She fatigues very quickly and reports dizziness after gt trial.   Stairs             Wheelchair Mobility    Modified Rankin (Stroke Patients Only)       Balance Overall balance assessment: Needs assistance   Sitting balance-Leahy Scale: Fair Sitting balance - Comments: Pt with increased sitting balance compared to yesterday     Standing balance-Leahy Scale: Poor Standing balance comment: External assistance and R hand support.                            Cognition Arousal/Alertness: Awake/alert Behavior During Therapy: WFL for tasks assessed/performed Overall Cognitive Status: Impaired/Different from baseline Area of Impairment: Memory;Problem solving;Awareness;Orientation  Orientation Level: Disoriented to;Place;Time;Situation   Memory: Decreased short-term memory     Awareness: Emergent   General Comments: Pt continues to perseverate  on getting dressed.  She reports she need to find her purse and get out of here.  Pt required constant redirection,      Exercises      General Comments        Pertinent Vitals/Pain Pain Assessment: Faces Faces Pain Scale: Hurts little more Pain Location: LUE and back Pain Descriptors / Indicators: Aching;Guarding Pain Intervention(s): Monitored during session;Repositioned    Home Living                      Prior Function            PT Goals (current goals can now be found in the care plan section) Acute Rehab PT Goals Patient Stated Goal: to get better and stop falling Potential to Achieve Goals: Good Progress towards PT goals: Progressing toward goals    Frequency    Min 3X/week      PT Plan Current plan remains appropriate    Co-evaluation              AM-PAC PT "6 Clicks" Mobility   Outcome Measure  Help needed turning from your back to your side while in a flat bed without using bedrails?: A Lot Help needed moving from lying on your back to sitting on the side of a flat bed without using bedrails?: A Lot Help needed moving to and from a bed to a chair (including a wheelchair)?: A Lot Help needed standing up from a chair using your arms (e.g., wheelchair or bedside chair)?: A Lot Help needed to walk in hospital room?: A Lot Help needed climbing 3-5 steps with a railing? : Total 6 Click Score: 11    End of Session Equipment Utilized During Treatment: Gait belt Activity Tolerance: Patient tolerated treatment well Patient left: in chair;with call bell/phone within reach;with chair alarm set Nurse Communication: Mobility status PT Visit Diagnosis: Repeated falls (R29.6);History of falling (Z91.81);Muscle weakness (generalized) (M62.81);Pain;Dizziness and giddiness (R42);Difficulty in walking, not elsewhere classified (R26.2) Pain - Right/Left: Left Pain - part of body: Arm     Time: [id number] PT Time Calculation (min) (ACUTE ONLY): 42  min  Charges:  $Gait Training: 8-22 mins $Therapeutic Activity: 23-37 mins                     Governor Rooks, PTA Acute Rehabilitation Services Pager [phone] Office [phone]     Kean Gautreau Eli Hose 07/23/2019, 2:19 PM

## 2019-07-23 NOTE — Discharge Summary (Addendum)
Discharge Summary  Melinda Maxwell [id number] DOB: [birth year]  PCP: [name], MD  Admit date: 07/16/2019 Discharge date: 07/23/2019  Time spent: 35 minutes   Recommendations for Outpatient Follow-up:  1. Follow-up with cardiology 2. Follow-up with orthopedic surgery 3. Follow up with your PCP 4. Continue PT at SNF 5. Take your medications as prescribed 6. Fall precautions    Discharge Diagnoses:  Active Hospital Problems   Diagnosis Date Noted   Subdural hematoma ([place]) 07/16/2019   Closed displaced fracture of surgical neck of left humerus 07/16/2019   Non-sustained ventricular tachycardia (HCC) 07/16/2019   Chronic diastolic CHF (congestive heart failure) ([place]) 07/16/2019   CKD (chronic kidney disease), stage III 07/16/2019   Syncope    CAD (coronary artery disease) 03/26/2018   Chronic deep vein thrombosis (DVT) ([place]) 08/10/2014   Diabetes mellitus with stage 3 chronic kidney disease ([place]) 07/09/2014    Resolved Hospital Problems  No resolved problems to display.    Discharge Condition: Stable  Diet recommendation: Resume previous diet  Vitals:   07/23/19 0503 07/23/19 0835  BP:  (!) 183/73  Pulse:  62  Resp:  20  Temp: 98 F (36.7 C) 97.8 F (36.6 C)  SpO2:  97%    History of present illness:  Melinda Maxwell [age] y.o.femalewith medical history significant forcoronary artery disease, chronic diastolic CHF, DVT on Xarelto, frequent PVCs, and sleep apnea, now presenting to the emergency department with headache and left arm pain after a fall and syncopal episodeat home. Work up revealed acute displaced fracture of the proximal left humerus, noncontrast head CT reveals subdural hematoma along the left frontal convexity, 3 mm in thickness, and no acute cervical spine fracture identified on CT.Neurosurgery and orthopedic surgeryconsultedas well as cardiology for syncope.  Repeat CT head no contrast done on 07/22/2019 showed near  resolution of subdural hematoma.  Per neurosurgery, Dr. Ronnald Ramp, on 07/23/2019, okay to resume oral anticoagulation.  07/23/19: Patient was seen and examined at her bedside this morning.  No acute events overnight.  She has no new complaints.    Discharge was delayed on 07/22/2019 due to complaint of dizziness, work-up unrevealing, CT head no contrast showed near resolution of subdural hematoma, orthostatic vital signs unrevealing.  Urine culture, multiple species present suggesting recollection.  Received 1 day of IV Rocephin on 07/22/2019.  On 07/23/2019 received 1 dose of fosfomycin.  Vital signs and labs reviewed, patient is stable to discharge to SNF.  She will need to follow-up with cardiology, orthopedic surgery, and her PCP posthospitalization.  She will also need to continue physical therapy and fall precautions.  Repeat Covid-19 done on 07/20/2019- not detected.   Hospital Course:  Principal Problem:   Subdural hematoma (HCC) Active Problems:   CAD (coronary artery disease)   Chronic deep vein thrombosis (DVT) (HCC)   Diabetes mellitus with stage 3 chronic kidney disease (HCC)   Closed displaced fracture of surgical neck of left humerus   Non-sustained ventricular tachycardia (HCC)   Chronic diastolic CHF (congestive heart failure) (HCC)   CKD (chronic kidney disease), stage III   Syncope   Syncope possibly cardiogenic X2 in the last 1 week History of nonsustained V. tach and P. A. Fib Seen by cardiology, signed off on 07/17/2019. Cardiology recommended aspirin 81 mg daily, Eliquis 5 mg twice daily starting on 07/21/2019. Patient to follow-up with her outpatient cardiologist for outpatient implanted loop recorder   Dizziness, rule out central etiology CT head no contrast ordered to further assess,  showed near resolution of subdural hematoma Orthostatic vital signs unrevealing Urine culture suggestive recollection due to multiple species present Received 1 day of Rocephin  from 10/14 through 07/23/2019 and 1 dose of fosfomycin on 07/23/2019 Started PRN meclizine for dizziness Continue physical therapy at SNF with fall precautions  Traumatic subdural hematoma post fall Seen by neurosurgery No new hemorrhage on repeated CT head, near resolution Okay to resume anticoagulation per neurosurgery  Paroxysmal atrial fibrillationwith RVR -Previously on Xarelto, switched to Eliquis starting 10/13 Rate controlled on cardizem Follow-up with your cardiologist  Uncontrolled hypertension Continue p.o. Cardizem 30 mg twice daily Started on p.o. hydralazine 10 mg 3 times daily on 07/22/2019 Increased dose of hydralazine to 25 mg 3 times daily on 07/23/2019 Follow-up with your PCP  Resolved sinus bradycardia, iatrogenic in the setting of beta blockers  Displaced fracture of left humeral neck -S/p fall -Orthopedic surgery consulted -CT shoulder revealedacute comminuted, impacted, displaced and angulated fracture involving the proximal shaft of the humerus with extension to the anatomical neck. No dislocation of the humeral head. -Nonsurgical treatment. Continue left shoulder gentle PROM with sling for support and comfort. Active motion of elbow forearm wrist and hand. -Follow-up with Dr. Marcelino Scot in 10 to 14 days  Non-sustained VT -Patient with multiple PVCs on telemetry on my exam, noted NSVT by EMS en route -Cardiology recommends outpatient loop recorder placement by her primary cardiology  CAD -S/p PCIof LAD inMay 2019 -Follows withDr[facility] Nehemiah [facility]/13 -Continue zocor  Chronic diastolic heart failure -Euvolemic, stable at this time -Demadex discontinued due to orthostatic hypotension  Chronic kidney disease stage III -Baseline creatinine1.1-1.48 -Stable   History of DVT -Xarelton PTA. Switchedto Eliquis, started 10/13  Pyuria U/A done on 07/22/19  showed pyuria Unclear if related to her dizziness Urine  cx showed multiple species present and suggestive recollection Received 1 day of IV Rocephin Obtained 1 dose of fosfomycin on 07/23/2019  Physical debility/ambulatory dysfunction PT recs SNF CSW assisting w placement     DVT prophylaxis:Eliquis Code Status:Full code    Consultants:  Orthopedic surgery  Neurosurgery  Cardiology   Discharge Exam: BP (!) 183/73 (BP Location: Right Arm)    Pulse 62    Temp 97.8 F (36.6 C) (Oral)    Resp 20    Ht 5\' 1"  (1.549 m)    Wt 71.3 kg    SpO2 97%    BMI 29.70 kg/m   General: 78 y.o. year-old female well developed well nourished in no acute distress.  Alert and oriented x3.  Cardiovascular: Regular rate and rhythm with no rubs or gallops.  No thyromegaly or JVD noted.    Respiratory: Clear to auscultation with no wheezes or rales. Good inspiratory effort.  Abdomen: Soft nontender nondistended with normal bowel sounds x4 quadrants.  Musculoskeletal: No lower extremity edema. 2/4 pulses in all 4 extremities.  Psychiatry: Mood is appropriate for condition and setting  Discharge Instructions You were cared for by a hospitalist during your hospital stay. If you have any questions about your discharge medications or the care you received while you were in the hospital after you are discharged, you can call the unit and asked to speak with the hospitalist on call if the hospitalist that took care of you is not available. Once you are discharged, your primary care physician will handle any further medical issues. Please note that NO REFILLS for any discharge medications will be authorized once you are discharged, as it is imperative that you return  to your primary care physician (or establish a relationship with a primary care physician if you do not have one) for your aftercare needs so that they can reassess your need for medications and monitor your lab values.   Allergies as of 07/23/2019      Reactions   Benzocaine Other (See  Comments)   Unknown   Lisinopril Cough   Neomycin Other (See Comments)   On allergy testing   Sulfamethoxazole-trimethoprim Other (See Comments)   Unknown   Sulfa Antibiotics Rash      Medication List    STOP taking these medications   clopidogrel 75 MG tablet Commonly known as: PLAVIX   potassium chloride 10 MEQ tablet Commonly known as: KLOR-CON   rivaroxaban 20 MG Tabs tablet Commonly known as: XARELTO   torsemide 20 MG tablet Commonly known as: DEMADEX     TAKE these medications   ALLERGY EYE OP Place 1 drop into both eyes daily as needed (for dry eyes).   allopurinol 300 MG tablet Commonly known as: ZYLOPRIM Take 300 mg by mouth daily.   apixaban 5 MG Tabs tablet Commonly known as: ELIQUIS Take 1 tablet (5 mg total) by mouth 2 (two) times daily.   aspirin 81 MG chewable tablet Chew 1 tablet (81 mg total) by mouth daily. Start taking on: July 24, 2019   colchicine 0.6 MG tablet Take by mouth.   diltiazem 30 MG tablet Commonly known as: CARDIZEM Take 30 mg by mouth 2 (two) times daily.   hydrALAZINE 25 MG tablet Commonly known as: APRESOLINE Take 1 tablet (25 mg total) by mouth 3 (three) times daily.   meclizine 12.5 MG tablet Commonly known as: ANTIVERT Take 1 tablet (12.5 mg total) by mouth 2 (two) times daily as needed for dizziness.   nitroGLYCERIN 0.4 MG SL tablet Commonly known as: NITROSTAT Place 0.4 mg under the tongue every 5 (five) minutes as needed for chest pain.   nystatin powder Commonly known as: MYCOSTATIN/NYSTOP Apply 1 g topically daily as needed. areas or skinfolds for yeast   pantoprazole 40 MG tablet Commonly known as: PROTONIX Take 40 mg by mouth daily.   simvastatin 10 MG tablet Commonly known as: ZOCOR Take 10 mg by mouth daily. HLD- reduced d/t interaction with Diltiazem- increased r/f Rhabdomyelosis   Vitamin D3 50 MCG (2000 UT) capsule Take by mouth.      Allergies  Allergen Reactions   Benzocaine Other  (See Comments)    Unknown   Lisinopril Cough   Neomycin Other (See Comments)    On allergy testing   Sulfamethoxazole-Trimethoprim Other (See Comments)    Unknown   Sulfa Antibiotics Rash    Contact information for follow-up providers    Altamese K[place], MD. Call in 1 day(s).   Specialty: Orthopedic Surgery Why: Please call for a post hospital follow-up appointment. Contact information: [place] [address] [phone]        Corey Skains, MD. Call in 1 day(s).   Specialty: Cardiology Why: Please call for a post hospital follow-up appointment. Contact information: [address] [place] [place] [address] [phone]            Contact information for after-discharge care    Destination    [facility] Preferred SNF .   Service: Skilled Nursing Contact information: [address] [place] [phone]  The results of significant diagnostics from this hospitalization (including imaging, microbiology, ancillary and laboratory) are listed below for reference.    Significant Diagnostic Studies: Dg Elbow 2 Views Left  Result Date: 07/16/2019 CLINICAL DATA:  Fall EXAM: LEFT ELBOW - 2 VIEW COMPARISON:  None. FINDINGS: There is no evidence of fracture, dislocation, or joint effusion. There is no evidence of arthropathy or other focal bone abnormality. Soft tissues are unremarkable. IMPRESSION: Negative. Electronically Signed   By: Donavan Foil M.D.   On: 07/16/2019 03:58   Ct Head Wo Contrast  Result Date: 07/22/2019 CLINICAL DATA:  Followup subdural hematoma. EXAM: CT HEAD WITHOUT CONTRAST TECHNIQUE: Contiguous axial images were obtained from the base of the skull through the vertex without intravenous contrast. COMPARISON:  07/17/2019. 07/16/2019. FINDINGS: Brain: Complete resolution of previously seen minor left-sided subdural hematoma. No  evidence of intracranial hemorrhage at the present time. The brain shows atrophy with chronic small-vessel ischemic changes of the white matter. No mass, hydrocephalus or extra-axial collection. Vascular: There is atherosclerotic calcification of the major vessels at the base of the brain. Skull: No skull fracture. Sinuses/Orbits: Clear/normal Other: Left frontal scalp hematoma is getting smaller. IMPRESSION: 1. Complete resolution of previously seen minor left-sided subdural hematoma. No evidence of intracranial hemorrhage at the present time. 2. Atrophy and chronic small-vessel ischemic changes of the white matter. 3. Left frontal scalp hematoma is getting smaller. Electronically Signed   By: Nelson Chimes M.D.   On: 07/22/2019 16:08   Ct Head Wo Contrast  Result Date: 07/17/2019 CLINICAL DATA:  Subdural hematoma follow-up EXAM: CT HEAD WITHOUT CONTRAST TECHNIQUE: Contiguous axial images were obtained from the base of the skull through the vertex without intravenous contrast. COMPARISON:  07/16/2019 CT head FINDINGS: Brain: Redistribution of trace left convexity subdural blood. No new hemorrhage. The blood is now primarily along the left tentorial leaflet. There is periventricular hypoattenuation compatible with chronic microvascular disease. Vascular: Atherosclerotic calcification of the vertebral and internal carotid arteries at the skull base. No abnormal hyperdensity of the major intracranial arteries or dural venous sinuses. Skull: Unchanged size of left frontoparietal scalp hematoma. No skull fracture. Sinuses/Orbits: No fluid levels or advanced mucosal thickening of the visualized paranasal sinuses. No mastoid or middle ear effusion. The orbits are normal. IMPRESSION: 1. Redistribution of trace left convexity subdural blood, now primarily along the left tentorial leaflet. No new hemorrhage. 2. Unchanged size of left frontoparietal scalp hematoma. Electronically Signed   By: Ulyses Jarred M.D.   On:  07/17/2019 02:03   Ct Head Wo Contrast  Result Date: 07/16/2019 CLINICAL DATA:  Posttraumatic headache EXAM: CT HEAD WITHOUT CONTRAST CT CERVICAL SPINE WITHOUT CONTRAST TECHNIQUE: Multidetector CT imaging of the head and cervical spine was performed following the standard protocol without intravenous contrast. Multiplanar CT image reconstructions of the cervical spine were also generated. COMPARISON:  None. FINDINGS: CT HEAD FINDINGS Brain: 3 mm high-density subdural collection along the lateral left frontal convexity, especially convincing on coronal reformats. No parenchymal hemorrhage or swelling. No hydrocephalus or masslike finding. Vascular: No hyperdense vessel or unexpected calcification. Skull: Left anterior scalp hematoma without calvarial fracture. Sinuses/Orbits: No evidence of injury. Bilateral cataract resection and partial left mastoid opacification. CT CERVICAL SPINE FINDINGS Alignment: Degenerative 3 mm anterolisthesis at C4-5. Skull base and vertebrae: Negative for acute fracture. Soft tissues and spinal canal: No prevertebral fluid or swelling. No visible canal hematoma. Disc levels: Advanced lower cervical degenerative disc narrowing with endplate ridging especially at C5-6. Upper chest: Negative Critical Value/emergent  results were called by telephone at the time of interpretation on 07/16/2019 at 4:15 am to [place] , who verbally acknowledged these results. IMPRESSION: 1. Trace acute subdural hematoma along the left frontal convexity, only 3 mm in thickness. 2. Left frontal scalp hematoma without calvarial fracture. 3. Negative for cervical spine fracture. Electronically Signed   By: Monte Fantasia M.D.   On: 07/16/2019 04:15   Ct Cervical Spine Wo Contrast  Result Date: 07/16/2019 CLINICAL DATA:  Posttraumatic headache EXAM: CT HEAD WITHOUT CONTRAST CT CERVICAL SPINE WITHOUT CONTRAST TECHNIQUE: Multidetector CT imaging of the head and cervical spine was performed following  the standard protocol without intravenous contrast. Multiplanar CT image reconstructions of the cervical spine were also generated. COMPARISON:  None. FINDINGS: CT HEAD FINDINGS Brain: 3 mm high-density subdural collection along the lateral left frontal convexity, especially convincing on coronal reformats. No parenchymal hemorrhage or swelling. No hydrocephalus or masslike finding. Vascular: No hyperdense vessel or unexpected calcification. Skull: Left anterior scalp hematoma without calvarial fracture. Sinuses/Orbits: No evidence of injury. Bilateral cataract resection and partial left mastoid opacification. CT CERVICAL SPINE FINDINGS Alignment: Degenerative 3 mm anterolisthesis at C4-5. Skull base and vertebrae: Negative for acute fracture. Soft tissues and spinal canal: No prevertebral fluid or swelling. No visible canal hematoma. Disc levels: Advanced lower cervical degenerative disc narrowing with endplate ridging especially at C5-6. Upper chest: Negative Critical Value/emergent results were called by telephone at the time of interpretation on 07/16/2019 at 4:15 am to [place] , who verbally acknowledged these results. IMPRESSION: 1. Trace acute subdural hematoma along the left frontal convexity, only 3 mm in thickness. 2. Left frontal scalp hematoma without calvarial fracture. 3. Negative for cervical spine fracture. Electronically Signed   By: Monte Fantasia M.D.   On: 07/16/2019 04:15   Ct Shoulder Left Wo Contrast  Result Date: 07/17/2019 CLINICAL DATA:  Shoulder fracture EXAM: CT OF THE UPPER LEFT EXTREMITY WITHOUT CONTRAST TECHNIQUE: Multidetector CT imaging of the upper left extremity was performed according to the standard protocol. COMPARISON:  Radiograph 07/16/2019 FINDINGS: Bones/Joint/Cartilage Left AC joint is intact. No clavicle fracture. Left upper ribs are intact. No scapular fracture. Acute comminuted fracture involving the proximal shaft of the humerus, and extending to the  anatomical neck. About 1/2 bone width displacement of the distal fracture fragment toward the midline with mild to moderate valgus angulation of distal fracture fragment. Impaction of the outer cortex of the humerus shaft into the humeral neck by approximately 14 mm. 19 mm horizontally oriented bone fragment anterior to the humeral neck with smaller 7 mm cortical bone fragment posterior to the neck. No humeral head dislocation. No definite articular surface extension of fracture lucency. Ligaments Suboptimally assessed by CT. Muscles and Tendons No significant atrophy.  No intramuscular fluid collection. Soft tissues Moderate edema within the left axillary and shoulder soft tissues. IMPRESSION: 1. Acute comminuted, impacted, displaced and angulated fracture involving the proximal shaft of the humerus with extension to the anatomical neck. No dislocation of the humeral head. 2. Moderate soft tissue edema at the left shoulder. Electronically Signed   By: Donavan Foil M.D.   On: 07/17/2019 02:17   Dg Chest Portable 1 View  Result Date: 07/16/2019 CLINICAL DATA:  Fall EXAM: PORTABLE CHEST 1 VIEW COMPARISON:  02/24/2018 FINDINGS: No focal airspace disease or effusion. Cardiomediastinal silhouette is within normal limits. Aortic atherosclerosis. No pneumothorax. Acute displaced fracture involving the proximal left humerus. IMPRESSION: No active disease. Acute displaced fracture involving the proximal left humerus.  Electronically Signed   By: Donavan Foil M.D.   On: 07/16/2019 03:57   Dg Shoulder Left Portable  Result Date: 07/16/2019 CLINICAL DATA:  Fall EXAM: LEFT SHOULDER - 1 VIEW COMPARISON:  None. FINDINGS: Acute fracture involving the left humeral neck with about 1/2 bone wid anterior displacement distal fracture fragment and mild valgus angulation of distal fracture fragment. Humeral head does not appear dislocated. IMPRESSION: Acute displaced and angulated humeral neck fracture Electronically Signed   By:  Donavan Foil M.D.   On: 07/16/2019 03:57    Microbiology: Recent Results (from the past 240 hour(s))  SARS CORONAVIRUS 2 (TAT 6-24 HRS) Nasopharyngeal Nasopharyngeal Swab     Status: None   Collection Time: 07/16/19  6:09 AM   Specimen: Nasopharyngeal Swab  Result Value Ref Range Status   SARS Coronavirus 2 NEGATIVE NEGATIVE Final    Comment: (NOTE) SARS-CoV-2 target nucleic acids are NOT DETECTED. The SARS-CoV-2 RNA is generally detectable in upper and lower respiratory specimens during the acute phase of infection. Negative results do not preclude SARS-CoV-2 infection, do not rule out co-infections with other pathogens, and should not be used as the sole basis for treatment or other patient management decisions. Negative results must be combined with clinical observations, patient history, and epidemiological information. The expected result is Negative. Fact Sheet for Patients: SugarRoll.be Fact Sheet for Healthcare Providers: https://www.woods-mathews.com/ This test is not yet approved or cleared by the [place] FDA and  has been authorized for detection and/or diagnosis of SARS-CoV-2 by FDA under an Emergency Use Authorization (EUA). This EUA will remain  in effect (meaning this test can be used) for the duration of the COVID-19 declaration under Section 56 4(b)(1) of the Act, 21 U.S.C. section 360bbb-3(b)(1), unless the authorization is terminated or revoked sooner. Performed at [facility], [place] [address] [place], [place] [address]   Novel Coronavirus, NAA (Hosp order, Send-out to Ref Lab; TAT 18-24 hrs     Status: None   Collection Time: 07/20/19  4:26 PM   Specimen: Nasopharyngeal Swab; Respiratory  Result Value Ref Range Status   SARS-CoV-2, NAA NOT DETECTED NOT DETECTED Final    Comment: (NOTE) This nucleic acid amplification test was developed and its performance characteristics determined by [employer]. Nucleic acid amplification tests include PCR and TMA. This test has not been FDA cleared or approved. This test has been authorized by FDA under an Emergency Use Authorization (EUA). This test is only authorized for the duration of time the declaration that circumstances exist justifying the authorization of the emergency use of in vitro diagnostic tests for detection of SARS-CoV-2 virus and/or diagnosis of COVID-19 infection under section 564(b)(1) of the Act, 21 U.S.C. [id number]) (1), unless the authorization is terminated or revoked sooner. When diagnostic testing is negative, the possibility of a false negative result should be considered in the context of a patient's recent exposures and the presence of clinical signs and symptoms consistent with COVID-19. An individual without symptoms of COVID- 19 and who is not shedding SARS-CoV-2 vi rus would expect to have a negative (not detected) result in this assay. Performed At: [facility] [address] [place], [place] [id number] Rush Farmer MD [id number]    IXL  Final    Comment: Performed at [facility], [place] [address] [place], [place] [address]  Culture, Urine     Status: Abnormal   Collection Time: 07/22/19  5:00 AM   Specimen: Urine, Random  Result  Value Ref Range Status   Specimen Description URINE, RANDOM  Final   Special Requests   Final    NONE Performed at [facility], [place] [address], [place], [place] [address]    Culture MULTIPLE SPECIES PRESENT, SUGGEST RECOLLECTION (A)  Final   Report Status 07/23/2019 FINAL  Final     Labs: Basic Metabolic Panel: Recent Labs  Lab 07/17/19 0426 07/18/19 0316 07/22/19 0410  NA 140 139 139  K 3.8 3.8 4.0  CL 103 102 111  CO2 25 25 19*  GLUCOSE 128* 115* 125*  BUN 23 25* 28*  CREATININE 1.10* 0.98 0.94  CALCIUM 9.7 9.7 9.3  MG  --  1.9  --    Liver Function Tests: No results for input(s): AST,  ALT, ALKPHOS, BILITOT, PROT, ALBUMIN in the last 168 hours. No results for input(s): LIPASE, AMYLASE in the last 168 hours. No results for input(s): AMMONIA in the last 168 hours. CBC: Recent Labs  Lab 07/17/19 0426 07/22/19 0410  WBC 6.9 5.9  HGB 8.9* 8.7*  HCT 27.7* 27.2*  MCV 94.2 96.5  PLT 207 213   Cardiac Enzymes: No results for input(s): CKTOTAL, CKMB, CKMBINDEX, TROPONINI in the last 168 hours. BNP: BNP (last 3 results) No results for input(s): BNP in the last 8760 hours.  ProBNP (last 3 results) No results for input(s): PROBNP in the last 8760 hours.  CBG: Recent Labs  Lab 07/21/19 0600 07/21/19 1127 07/21/19 1600 07/22/19 0613 07/23/19 0701  GLUCAP 137* 104* 195* 114* 114*       Signed:  Kayleen Memos, MD [facility] 07/23/2019, 11:02 AM

## 2019-07-23 NOTE — Progress Notes (Signed)
Physician made aware of patient experiencing visual hallucinations today.

## 2019-07-23 NOTE — TOC Progression Note (Signed)
Transition of Care [facility]) - Progression Note    Patient Details  Name: Melinda Maxwell MRN: [id number] Date of Birth: [birth year]  Transition of Care [facility]) CM/SW [place], [place] Phone Number: 07/23/2019, 1:02 PM  Clinical Narrative:   CSW received authorization update from [facility] this morning that patient's insurance authorization was extended (previous authorization lapsed as patient was not medically stable). CSW sent discharge to [facility], and then was updated by [facility] that they have had a COVID outbreak overnight and are unable to take the patient today. CSW reached out to [facility], daughter's other preference for SNF, and they have also had a COVID outbreak overnight and are not taking admissions. CSW updated MD about barriers to discharge and left patient's daughter, [name], a voicemail.  [name] later called back and CSW provided update. [name] unsure of any [facility] SNF options, so CSW provided CMS list. [name] provided permission for CSW to fax out referral and to follow up with her on bed offers. [name] prefers [facility] in [place] as she has heard of that facility. CSW sent referral, will follow.    Expected Discharge Plan: [place] Barriers to Discharge: No SNF bed  Expected Discharge Plan and Services Expected Discharge Plan: [place] In-house Referral: Clinical Social Work Discharge Planning Services: NA Post Acute Care Choice: [place] Living arrangements for the past 2 months: Single Family Home Expected Discharge Date: 07/23/19               DME Arranged: N/A DME Agency: NA       HH Arranged: NA HH Agency: NA         Social Determinants of Health (SDOH) Interventions    Readmission Risk Interventions No flowsheet data found.

## 2019-07-23 NOTE — TOC Progression Note (Signed)
Transition of Care [facility]) - Progression Note    Patient Details  Name: Melinda Maxwell MRN: [id number] Date of Birth: 03-24-1941  Transition of Care [facility]) CM/SW [place], [place] Phone Number: 07/23/2019, 4:22 PM  Clinical Narrative:   CSW received update from [facility] that they are concerned about patient's mental status changes, so they are not willing to offer a bed at this time; would like to observe patient overnight or figure out what's causing it. CSW discussed with patient's daughter, and she indicated that this is not the patient's baseline and she is also concerned about her mother's behavior and the cause.  CSW updated MD about concerns, and will follow overnight for changes or improvement.    Expected Discharge Plan: [place] Barriers to Discharge: Continued Medical Work up, No SNF bed  Expected Discharge Plan and Services Expected Discharge Plan: [place] In-house Referral: Clinical Social Work Discharge Planning Services: NA Post Acute Care Choice: [place] Living arrangements for the past 2 months: Single Family Home Expected Discharge Date: 07/23/19               DME Arranged: N/A DME Agency: NA       HH Arranged: NA HH Agency: NA         Social Determinants of Health (SDOH) Interventions    Readmission Risk Interventions No flowsheet data found.

## 2019-07-24 LAB — GLUCOSE, CAPILLARY: Glucose-Capillary: 121 mg/dL — ABNORMAL HIGH (ref 70–99)

## 2019-07-24 LAB — SARS CORONAVIRUS 2 (TAT 6-24 HRS): SARS Coronavirus 2: NEGATIVE

## 2019-07-24 MED ORDER — HYDRALAZINE HCL 10 MG PO TABS: 10.0000 mg | ORAL_TABLET | Freq: Three times a day (TID) | ORAL | 0 refills | Status: DC

## 2019-07-24 MED ORDER — SODIUM CHLORIDE 0.9 % IV SOLN
INTRAVENOUS | Status: AC
  Administered 2019-07-24 – 2019-07-25 (×3): via INTRAVENOUS

## 2019-07-24 NOTE — Progress Notes (Signed)
Occupational Therapy Treatment Patient Details Name: Melinda Maxwell MRN: [id number] DOB: 05/09/1941 Today's Date: 07/24/2019    History of present illness Melinda Maxwell is an 78 y.o. female with medical history significant for coronary artery disease, chronic diastolic CHF, DVT on Xarelto, frequent PVCs, and sleep apnea, now presenting to the emergency department  - pt with left subdural hematoma and left proximal humerus fracture.   OT comments  Pt minA for trunk elevation and encouragement to move BLEs off of bed. ModA to scoot to EOB. Pt had intermittent bursts of laughter- appeared to be exagerated for situatio i.e. pt laughing at combs in OTR's pocket. Gentlr PROM to LUE and repositioning in sling; elbow to 170* and hand movements to 75% of typical.  Pt with decreased ability to perform ADL functional transfers and ADL tasks due to poor mobility, decreased confidence in taking steps and LUE weakness and pain. Pt would benefit from continued OT skilled services for ADL, mobility. OT following acutely.   Follow Up Recommendations  SNF    Equipment Recommendations  Other (comment)    Recommendations for Other Services      Precautions / Restrictions Precautions Precautions: Fall;Shoulder Type of Shoulder Precautions: Non operative/conservative Shoulder Interventions: Shoulder sling/immobilizer Precaution Comments: gentle PROM to shoulder; AROM elbow through hand Required Braces or Orthoses: Sling Restrictions Weight Bearing Restrictions: Yes LUE Weight Bearing: Non weight bearing Other Position/Activity Restrictions: left shoulder gentle PROM with sling for support and comfort and active motion of elbow, forearm, wrist, and hand.        Mobility Bed Mobility Overal bed mobility: Needs Assistance Bed Mobility: Supine to Sit     Supine to sit: Min assist     General bed mobility comments: minA for trunk elevation and encouragement to move BLEs off of bed. ModA to scoot to  EOB.  Transfers Overall transfer level: Needs assistance Equipment used: None Transfers: Sit to/from Stand;Stand Pivot Transfers Sit to Stand: Mod assist Stand pivot transfers: Mod assist       General transfer comment: Pt required moderated assistance to boost into standing and to elevate trunk into improved posture.  She presents with posterior lean and required assistance and VCs for weight shifting forward.    Balance Overall balance assessment: Needs assistance   Sitting balance-Leahy Scale: Fair Sitting balance - Comments: Pt requiring intermittent assist for sitting balance with cues to straighten up posture     Standing balance-Leahy Scale: Poor Standing balance comment: external assist                           ADL either performed or assessed with clinical judgement   ADL Overall ADL's : Needs assistance/impaired Eating/Feeding: Modified independent;Sitting   Grooming: Minimal assistance;Sitting                               Functional mobility during ADLs: Moderate assistance(for stand pivot) General ADL Comments: Pt with decreased ability to perform ADL functional transfers and ADL tasks due to poor mobility, decreased confidence in taking steps and LUE weakness and pain.     Vision   Vision Assessment?: No apparent visual deficits   Perception     Praxis      Cognition Arousal/Alertness: Awake/alert Behavior During Therapy: WFL for tasks assessed/performed Overall Cognitive Status: Impaired/Different from baseline Area of Impairment: Safety/judgement;Awareness;Memory                       Memory: Decreased short-term memory     Awareness: Emergent Problem Solving: Difficulty sequencing;Requires verbal cues General Comments: Pt had intermittent bursts of laughter- appeared to be exagerated for situatio i.e. pt laughing at combs in OTR's pocket.        Exercises     Shoulder Instructions       General Comments       Pertinent Vitals/ Pain       Pain Assessment: Faces Faces Pain Scale: Hurts even more Pain Location: L shoulder Pain Descriptors / Indicators: Discomfort;Grimacing Pain Intervention(s): Monitored during session;RN gave pain meds during session;Repositioned  Home Living                                          Prior Functioning/Environment              Frequency  Min 2X/week        Progress Toward Goals  OT Goals(current goals can now be found in the care plan section)  Progress towards OT goals: Progressing toward goals  Acute Rehab OT Goals Patient Stated Goal: to get better and stop falling OT Goal Formulation: With patient Time For Goal Achievement: 08/01/19 Potential to Achieve Goals: Good ADL Goals Pt Will Perform Grooming: sitting;with set-up Pt Will Perform Upper Body Dressing: with set-up;sitting Pt Will Perform Lower Body Dressing: with min assist;sit to/from stand Pt Will Perform Toileting - Clothing Manipulation and hygiene: with min assist;sit to/from stand Pt/caregiver will Perform Home Exercise Program: Left upper extremity;With written HEP provided;With minimal assist;Increased ROM  Plan All goals met and education completed, patient discharged from OT services    Co-evaluation                 AM-PAC OT "6 Clicks" Daily Activity     Outcome Measure   Help from another person eating meals?: A Little Help from another person taking care of personal grooming?: A Lot Help from another person toileting, which includes using toliet, bedpan, or urinal?: A Lot Help from another person bathing (including washing, rinsing, drying)?: A Lot Help from another person to put on and taking off regular upper body clothing?: A Lot Help from another person to put on and taking off regular lower body clothing?: A Lot 6 Click Score: 13    End of Session Equipment Utilized During Treatment: Gait belt  OT Visit Diagnosis: Unsteadiness  on feet (R26.81);Muscle weakness (generalized) (M62.81);Pain Pain - Right/Left: Left Pain - part of body: Arm   Activity Tolerance Patient limited by pain   Patient Left in chair;with call bell/phone within reach;with chair alarm set   Nurse Communication Mobility status;Precautions;Weight bearing status        Time: 5638-9373 OT Time Calculation (min): 25 min  Charges: OT General Charges $OT Visit: 1 Visit OT Treatments $Self Care/Home Management : 8-22 mins $Therapeutic Exercise: 8-22 mins  Darryl Nestle) Marsa Aris OTR/L Acute Rehabilitation Services Pager: [phone] Office: [place] 07/24/2019, 2:15 PM

## 2019-07-24 NOTE — Progress Notes (Signed)
Physical Therapy Treatment Patient Details Name: Melinda Maxwell MRN: [id number] DOB: 1941-07-01 Today's Date: 07/24/2019    History of Present Illness Melinda Maxwell is an 78 y.o. female with medical history significant for coronary artery disease, chronic diastolic CHF, DVT on Xarelto, frequent PVCs, and sleep apnea, now presenting to the emergency department  - pt with left subdural hematoma and left proximal humerus fracture.    PT Comments    Pt seated in recliner on arrival and verbalized seeing therapist at a party last night PTA re-oriented patient and she still perseverated on this party being real.  Pt performed multiple transfers with moderate assistance.  She continues to present with posterior lean and fatigues quickly. She continues to present with dizziness after taking steps back to bed from potty chair.  Pt continues to benefit from snf placement at this time.     Follow Up Recommendations  SNF;Supervision/Assistance - 24 hour     Equipment Recommendations  None recommended by PT    Recommendations for Other Services       Precautions / Restrictions Precautions Precautions: Fall;Shoulder Type of Shoulder Precautions: Non operative/conservative Shoulder Interventions: Shoulder sling/immobilizer Precaution Comments: gentle PROM to shoulder; AROM elbow through hand Required Braces or Orthoses: Sling Restrictions Weight Bearing Restrictions: Yes LUE Weight Bearing: Non weight bearing Other Position/Activity Restrictions: left shoulder gentle PROM with sling for support and comfort and active motion of elbow, forearm, wrist, and hand.     Mobility  Bed Mobility Overal bed mobility: Needs Assistance Bed Mobility: Sit to Supine     Supine to sit: Min assist Sit to supine: Mod assist;+2 for physical assistance   General bed mobility comments: mod +2 to move back to bed.  Assistance to lower trunk and lift LEs back to bed.  Transfers Overall transfer level: Needs  assistance Equipment used: 1 person hand held assist Transfers: Sit to/from Stand Sit to Stand: Mod assist;+2 safety/equipment Stand pivot transfers: Mod assist       General transfer comment: Cues for hand placement and tactile cueing to weight shift forward.  Pt continues to present with posterior lean.  Ambulation/Gait Ambulation/Gait assistance: Mod assist;+2 safety/equipment Gait Distance (Feet): 4 Feet(+ 4ft) Assistive device: 1 person hand held assist(face to face to turn from chair to commode and commode back to bed.) Gait Pattern/deviations: Leaning posteriorly;Decreased stride length;Shuffle;Step-to pattern;Trunk flexed     General Gait Details: Pt required cues for lateral weight shifting and VCs to progress steps forward.  She fatigues very quickly and reports dizziness after two short gt trials.   Stairs             Wheelchair Mobility    Modified Rankin (Stroke Patients Only)       Balance Overall balance assessment: Needs assistance   Sitting balance-Leahy Scale: Fair Sitting balance - Comments: Pt requiring intermittent assist for sitting balance with cues to straighten up posture     Standing balance-Leahy Scale: Poor Standing balance comment: external assist                            Cognition Arousal/Alertness: Awake/alert Behavior During Therapy: WFL for tasks assessed/performed Overall Cognitive Status: Impaired/Different from baseline Area of Impairment: Safety/judgement;Awareness;Memory                 Orientation Level: Disoriented to;Place;Time;Situation   Memory: Decreased short-term memory     Awareness: Emergent Problem Solving: Difficulty sequencing;Requires verbal cues General Comments: Pt  reports PTA was at a formal sit down dinner at a partu last night and she reports i fell asleep and would not get up.      Exercises      General Comments        Pertinent Vitals/Pain Pain Assessment: Faces Faces  Pain Scale: Hurts even more Pain Location: L shoulder Pain Descriptors / Indicators: Discomfort;Grimacing Pain Intervention(s): Monitored during session;Repositioned    Home Living                      Prior Function            PT Goals (current goals can now be found in the care plan section) Acute Rehab PT Goals Patient Stated Goal: to get better and stop falling Potential to Achieve Goals: Good Progress towards PT goals: Progressing toward goals    Frequency    Min 3X/week      PT Plan Current plan remains appropriate    Co-evaluation              AM-PAC PT "6 Clicks" Mobility   Outcome Measure  Help needed turning from your back to your side while in a flat bed without using bedrails?: A Lot Help needed moving from lying on your back to sitting on the side of a flat bed without using bedrails?: A Lot Help needed moving to and from a bed to a chair (including a wheelchair)?: A Lot Help needed standing up from a chair using your arms (e.g., wheelchair or bedside chair)?: A Lot Help needed to walk in hospital room?: A Lot Help needed climbing 3-5 steps with a railing? : Total 6 Click Score: 11    End of Session Equipment Utilized During Treatment: Gait belt Activity Tolerance: Patient tolerated treatment well Patient left: with call bell/phone within reach;in bed;with bed alarm set;with nursing/sitter in room Nurse Communication: Mobility status PT Visit Diagnosis: Repeated falls (R29.6);History of falling (Z91.81);Muscle weakness (generalized) (M62.81);Pain;Dizziness and giddiness (R42);Difficulty in walking, not elsewhere classified (R26.2) Pain - Right/Left: Left Pain - part of body: Arm     Time: [id number] PT Time Calculation (min) (ACUTE ONLY): 30 min  Charges:  $Gait Training: 8-22 mins $Therapeutic Activity: 8-22 mins                     Governor Rooks, PTA Acute Rehabilitation Services Pager [phone] Office  [phone]     Rebel Willcutt Eli Hose 07/24/2019, 3:49 PM

## 2019-07-24 NOTE — TOC Progression Note (Signed)
Transition of Care [facility]) - Progression Note    Patient Details  Name: Melinda Maxwell MRN: [id number] Date of Birth: 1941/06/08  Transition of Care [facility]) CM/SW [place], [place] Phone Number: 07/24/2019, 7:46 PM  Clinical Narrative:  CSW confirmed with RN and MD that patient has not had any more hallucinations overnight or today, is oriented this morning and stable. CSW reached out to [facility] and they have offered her a bed, but will need a COVID test within 48 hours. CSW asked MD to order and checked in with RN to collect.   CSW also contacted [place] to update patient's authorization. CSW updated Navi that patient will be admitting to [facility], and that she will admit over the weekend. Navi rebuilt case and updated the authorization number.   COVID test has not resulted at this time, unable to DC today. CSW to follow. Patient can discharge to [facility] once COVID test results.     Expected Discharge Plan: Skilled Nursing Facility Barriers to Discharge: Other (comment)(COVID test)  Expected Discharge Plan and Services Expected Discharge Plan: [place] In-house Referral: Clinical Social Work Discharge Planning Services: NA Post Acute Care Choice: [place] Living arrangements for the past 2 months: Single Family Home Expected Discharge Date: 07/24/19               DME Arranged: N/A DME Agency: NA       HH Arranged: NA HH Agency: NA         Social Determinants of Health (SDOH) Interventions    Readmission Risk Interventions No flowsheet data found.

## 2019-07-24 NOTE — Discharge Summary (Signed)
Discharge Summary  Melinda Maxwell [id number] DOB: 17-Feb-1941  PCP: [name], MD  Admit date: 07/16/2019 Discharge date: 07/24/2019  Time spent: 35 minutes   Recommendations for Outpatient Follow-up:  1. Follow-up with cardiology 2. Follow-up with orthopedic surgery 3. Follow up with your PCP 4. Continue PT at SNF 5. Take your medications as prescribed 6. Fall precautions    Discharge Diagnoses:  Active Hospital Problems   Diagnosis Date Noted   Subdural hematoma ([place]) 07/16/2019   Closed displaced fracture of surgical neck of left humerus 07/16/2019   Non-sustained ventricular tachycardia (HCC) 07/16/2019   Chronic diastolic CHF (congestive heart failure) ([place]) 07/16/2019   CKD (chronic kidney disease), stage III 07/16/2019   Syncope    CAD (coronary artery disease) 03/26/2018   Chronic deep vein thrombosis (DVT) ([place]) 08/10/2014   Diabetes mellitus with stage 3 chronic kidney disease ([place]) 07/09/2014    Resolved Hospital Problems  No resolved problems to display.    Discharge Condition: Stable  Diet recommendation: Resume previous diet  Vitals:   07/24/19 0736 07/24/19 1147  BP: 133/67 (!) 148/74  Pulse: (!) 52 (!) 59  Resp: 20 20  Temp: (!) 97.2 F (36.2 C) 98 F (36.7 C)  SpO2: 95% 94%    History of present illness:  SHELAH Maxwell [id number] y.o.femalewith medical history significant forcoronary artery disease, chronic diastolic CHF, DVT on Xarelto, frequent PVCs, and sleep apnea, now presenting to the emergency department with headache and left arm pain after a fall and syncopal episodeat home. Work up revealed acute displaced fracture of the proximal left humerus, noncontrast head CT reveals subdural hematoma along the left frontal convexity, 3 mm in thickness, and no acute cervical spine fracture identified on CT.Neurosurgery and orthopedic surgeryconsultedas well as cardiology for syncope.  Repeat CT head no contrast done  on 07/22/2019 showed near resolution of subdural hematoma.  Per neurosurgery, Dr. Ronnald Ramp, on 07/23/2019, okay to resume oral anticoagulation.  07/23/19: Patient was seen and examined at her bedside this morning.  No acute events overnight.  She has no new complaints.    Discharge was delayed on 07/22/2019 due to complaint of dizziness, work-up unrevealing, CT head no contrast showed near resolution of subdural hematoma, orthostatic vital signs unrevealing.  Urine culture, multiple species present suggesting recollection.  Received 1 day of IV Rocephin on 07/22/2019.  On 07/23/2019 received 1 dose of fosfomycin.  Vital signs and labs reviewed, patient is stable to discharge to SNF.  She will need to follow-up with cardiology, orthopedic surgery, and her PCP posthospitalization.  She will also need to continue physical therapy and fall precautions.  Repeat Covid-19 done on 07/20/2019- not detected.  07/24/19: Patient seen and examined at her bedside this morning.  She is somnolent but easily arousable and answers questions correctly.  Oriented x4.  No hallucinations, confirmed by bedside RN Amalia Hailey.   Hospital Course:  Principal Problem:   Subdural hematoma (HCC) Active Problems:   CAD (coronary artery disease)   Chronic deep vein thrombosis (DVT) (HCC)   Diabetes mellitus with stage 3 chronic kidney disease (HCC)   Closed displaced fracture of surgical neck of left humerus   Non-sustained ventricular tachycardia (HCC)   Chronic diastolic CHF (congestive heart failure) (HCC)   CKD (chronic kidney disease), stage III   Syncope   Syncope possibly cardiogenic X2 in the last 1 week History of nonsustained V. tach and P. A. Fib Seen by cardiology, signed off on 07/17/2019. Cardiology recommended aspirin 81 mg  daily, Eliquis 5 mg twice daily starting on 07/21/2019. Patient to follow-up with her outpatient cardiologist for outpatient implanted loop recorder   Resolved dizziness, rule out  central etiology CT head no contrast ordered to further assess, showed near resolution of subdural hematoma Positive orthostatic vital signs on 07/23/2019 Started on normal saline 50 cc/h x 1 day on 07/24/2019 Denies dizziness at the time of this visit Urine culture suggestive recollection due to multiple species present Received 1 day of Rocephin from 10/14 through 07/23/2019 and 1 dose of fosfomycin on 07/23/2019 Continue PRN meclizine for dizziness Continue physical therapy at SNF with fall precautions  Traumatic subdural hematoma post fall Seen by neurosurgery No new hemorrhage on repeated CT head, near resolution Okay to resume anticoagulation per neurosurgery  Orthostatic hypotension Positive orthostatic vital signs on 07/23/2019 Started on gentle IV fluid hydration normal saline at 50 cc/h x 1 day Closely monitor volume status Denies dizziness on 07/24/2019  Questionable hallucinations Reported on 07/23/2019 by bedside RN Anderson Malta No hallucinations reported on 07/24/2019, confirmed by bedside RN Evans  Paroxysmal atrial fibrillationwith RVR -Previously on Xarelto, switched to Eliquis starting 10/13 Rate controlled on cardizem Follow-up with your cardiologist  Uncontrolled hypertension Continue p.o. Cardizem 30 mg twice daily Started on p.o. hydralazine 10 mg 3 times daily on 07/22/2019 Increased dose of hydralazine to 25 mg 3 times daily on 07/23/2019 Follow-up with your PCP  Resolved sinus bradycardia, iatrogenic in the setting of beta blockers  Displaced fracture of left humeral neck -S/p fall -Orthopedic surgery consulted -CT shoulder revealedacute comminuted, impacted, displaced and angulated fracture involving the proximal shaft of the humerus with extension to the anatomical neck. No dislocation of the humeral head. -Nonsurgical treatment. Continue left shoulder gentle PROM with sling for support and comfort. Active motion of elbow forearm wrist and  hand. -Follow-up with Dr. Marcelino Scot in 10 to 14 days  Non-sustained VT -Patient with multiple PVCs on telemetry on my exam, noted NSVT by EMS en route -Cardiology recommends outpatient loop recorder placement by her primary cardiology  CAD -S/p PCIof LAD inMay 2019 -Follows withDr. Nehemiah Massed in [place] 10/13 -Continue zocor  Chronic diastolic heart failure -Euvolemic, stable at this time -Demadex discontinued due to orthostatic hypotension  Chronic kidney disease stage III -Baseline creatinine1.1-1.48 -Stable   History of DVT -Xarelton PTA. Switchedto Eliquis, started 10/13  Pyuria U/A done on 07/22/19  showed pyuria Unclear if related to her dizziness Urine cx showed multiple species present and suggestive recollection Received 1 day of IV Rocephin Obtained 1 dose of fosfomycin on 07/23/2019  Physical debility/ambulatory dysfunction PT recs SNF CSW assisting w placement     DVT prophylaxis:Eliquis Code Status:Full code    Consultants:  Orthopedic surgery  Neurosurgery  Cardiology   Discharge Exam: BP (!) 148/74 (BP Location: Right Arm)    Pulse (!) 59    Temp 98 F (36.7 C) (Axillary)    Resp 20    Ht 5\' 1"  (1.549 m)    Wt 71.1 kg    SpO2 94%    BMI 29.62 kg/m   General: 78 y.o. year-old female well-developed well-nourished no acute distress.  Somnolent but easily arousable to voices.  Oriented x4.  Cardiovascular: Regular rate and rhythm no rubs or gallops no JVD thyromegaly noted.  Respiratory: Clear to station no wheezes or rales.  Good inspiratory effort.    Abdomen: Nontender nondistended bowel sounds present.   Musculoskeletal: No lower extremity edema.  2 out of 4 pulses in all 4  extremities.   Psychiatry: Mood is appropriate for condition and setting.  Discharge Instructions You were cared for by a hospitalist during your hospital stay. If you have any questions about your discharge medications or the  care you received while you were in the hospital after you are discharged, you can call the unit and asked to speak with the hospitalist on call if the hospitalist that took care of you is not available. Once you are discharged, your primary care physician will handle any further medical issues. Please note that NO REFILLS for any discharge medications will be authorized once you are discharged, as it is imperative that you return to your primary care physician (or establish a relationship with a primary care physician if you do not have one) for your aftercare needs so that they can reassess your need for medications and monitor your lab values.   Allergies as of 07/24/2019      Reactions   Benzocaine Other (See Comments)   Unknown   Lisinopril Cough   Neomycin Other (See Comments)   On allergy testing   Sulfamethoxazole-trimethoprim Other (See Comments)   Unknown   Sulfa Antibiotics Rash      Medication List    STOP taking these medications   clopidogrel 75 MG tablet Commonly known as: PLAVIX   potassium chloride 10 MEQ tablet Commonly known as: KLOR-CON   rivaroxaban 20 MG Tabs tablet Commonly known as: XARELTO   torsemide 20 MG tablet Commonly known as: DEMADEX     TAKE these medications   ALLERGY EYE OP Place 1 drop into both eyes daily as needed (for dry eyes).   allopurinol 300 MG tablet Commonly known as: ZYLOPRIM Take 300 mg by mouth daily.   apixaban 5 MG Tabs tablet Commonly known as: ELIQUIS Take 1 tablet (5 mg total) by mouth 2 (two) times daily.   aspirin 81 MG chewable tablet Chew 1 tablet (81 mg total) by mouth daily.   colchicine 0.6 MG tablet Take by mouth.   diltiazem 30 MG tablet Commonly known as: CARDIZEM Take 30 mg by mouth 2 (two) times daily.   hydrALAZINE 10 MG tablet Commonly known as: APRESOLINE Take 1 tablet (10 mg total) by mouth every 8 (eight) hours.   meclizine 12.5 MG tablet Commonly known as: ANTIVERT Take 1 tablet (12.5 mg  total) by mouth 2 (two) times daily as needed for dizziness.   nitroGLYCERIN 0.4 MG SL tablet Commonly known as: NITROSTAT Place 0.4 mg under the tongue every 5 (five) minutes as needed for chest pain.   nystatin powder Commonly known as: MYCOSTATIN/NYSTOP Apply 1 g topically daily as needed. areas or skinfolds for yeast   pantoprazole 40 MG tablet Commonly known as: PROTONIX Take 40 mg by mouth daily.   simvastatin 10 MG tablet Commonly known as: ZOCOR Take 10 mg by mouth daily. HLD- reduced d/t interaction with Diltiazem- increased r/f Rhabdomyelosis   Vitamin D3 50 MCG (2000 UT) capsule Take by mouth.      Allergies  Allergen Reactions   Benzocaine Other (See Comments)    Unknown   Lisinopril Cough   Neomycin Other (See Comments)    On allergy testing   Sulfamethoxazole-Trimethoprim Other (See Comments)    Unknown   Sulfa Antibiotics Rash   Follow-up Information    [place], MD. Call in 1 day(s).   Specialty: Orthopedic Surgery Why: Please call for a post hospital follow-up appointment. Contact information: [place] [address] [phone]  Corey Skains, MD. Call in 1 day(s).   Specialty: Cardiology Why: Please call for a post hospital follow-up appointment. Contact information: [address] [facility] [place] [address] [phone]            The results of significant diagnostics from this hospitalization (including imaging, microbiology, ancillary and laboratory) are listed below for reference.    Significant Diagnostic Studies: Dg Elbow 2 Views Left  Result Date: 07/16/2019 CLINICAL DATA:  Fall EXAM: LEFT ELBOW - 2 VIEW COMPARISON:  None. FINDINGS: There is no evidence of fracture, dislocation, or joint effusion. There is no evidence of arthropathy or other focal bone abnormality. Soft tissues are unremarkable. IMPRESSION: Negative. Electronically Signed   By: Donavan Foil M.D.   On: 07/16/2019 03:58   Ct Head Wo Contrast  Result Date: 07/22/2019 CLINICAL DATA:  Followup subdural hematoma. EXAM: CT HEAD WITHOUT CONTRAST TECHNIQUE: Contiguous axial images were obtained from the base of the skull through the vertex without intravenous contrast. COMPARISON:  07/17/2019. 07/16/2019. FINDINGS: Brain: Complete resolution of previously seen minor left-sided subdural hematoma. No evidence of intracranial hemorrhage at the present time. The brain shows atrophy with chronic small-vessel ischemic changes of the white matter. No mass, hydrocephalus or extra-axial collection. Vascular: There is atherosclerotic calcification of the major vessels at the base of the brain. Skull: No skull fracture. Sinuses/Orbits: Clear/normal Other: Left frontal scalp hematoma is getting smaller. IMPRESSION: 1. Complete resolution of previously seen minor left-sided subdural hematoma. No evidence of intracranial hemorrhage at the present time. 2. Atrophy and chronic small-vessel ischemic changes of the white matter. 3. Left frontal scalp hematoma is getting smaller. Electronically Signed   By: Nelson Chimes M.D.   On: 07/22/2019 16:08   Ct Head Wo Contrast  Result Date: 07/17/2019 CLINICAL DATA:  Subdural hematoma follow-up EXAM: CT HEAD WITHOUT CONTRAST TECHNIQUE: Contiguous axial images were obtained from the base of the skull through the vertex without intravenous contrast. COMPARISON:  07/16/2019 CT head FINDINGS: Brain: Redistribution of trace left convexity subdural blood. No new hemorrhage. The blood is now primarily along the left tentorial leaflet. There is periventricular hypoattenuation compatible with chronic microvascular disease. Vascular: Atherosclerotic calcification of the vertebral and internal carotid arteries at the skull base. No abnormal hyperdensity of the major intracranial arteries or dural venous sinuses. Skull: Unchanged size of left frontoparietal scalp hematoma. No skull  fracture. Sinuses/Orbits: No fluid levels or advanced mucosal thickening of the visualized paranasal sinuses. No mastoid or middle ear effusion. The orbits are normal. IMPRESSION: 1. Redistribution of trace left convexity subdural blood, now primarily along the left tentorial leaflet. No new hemorrhage. 2. Unchanged size of left frontoparietal scalp hematoma. Electronically Signed   By: Ulyses Jarred M.D.   On: 07/17/2019 02:03   Ct Head Wo Contrast  Result Date: 07/16/2019 CLINICAL DATA:  Posttraumatic headache EXAM: CT HEAD WITHOUT CONTRAST CT CERVICAL SPINE WITHOUT CONTRAST TECHNIQUE: Multidetector CT imaging of the head and cervical spine was performed following the standard protocol without intravenous contrast. Multiplanar CT image reconstructions of the cervical spine were also generated. COMPARISON:  None. FINDINGS: CT HEAD FINDINGS Brain: 3 mm high-density subdural collection along the lateral left frontal convexity, especially convincing on coronal reformats. No parenchymal hemorrhage or swelling. No hydrocephalus or masslike finding. Vascular: No hyperdense vessel or unexpected calcification. Skull: Left anterior scalp hematoma without calvarial fracture. Sinuses/Orbits: No evidence of injury. Bilateral cataract resection and partial left mastoid opacification. CT CERVICAL SPINE FINDINGS Alignment: Degenerative 3  mm anterolisthesis at C4-5. Skull base and vertebrae: Negative for acute fracture. Soft tissues and spinal canal: No prevertebral fluid or swelling. No visible canal hematoma. Disc levels: Advanced lower cervical degenerative disc narrowing with endplate ridging especially at C5-6. Upper chest: Negative Critical Value/emergent results were called by telephone at the time of interpretation on 07/16/2019 at 4:15 am to [place] , who verbally acknowledged these results. IMPRESSION: 1. Trace acute subdural hematoma along the left frontal convexity, only 3 mm in thickness. 2. Left  frontal scalp hematoma without calvarial fracture. 3. Negative for cervical spine fracture. Electronically Signed   By: Monte Fantasia M.D.   On: 07/16/2019 04:15   Ct Cervical Spine Wo Contrast  Result Date: 07/16/2019 CLINICAL DATA:  Posttraumatic headache EXAM: CT HEAD WITHOUT CONTRAST CT CERVICAL SPINE WITHOUT CONTRAST TECHNIQUE: Multidetector CT imaging of the head and cervical spine was performed following the standard protocol without intravenous contrast. Multiplanar CT image reconstructions of the cervical spine were also generated. COMPARISON:  None. FINDINGS: CT HEAD FINDINGS Brain: 3 mm high-density subdural collection along the lateral left frontal convexity, especially convincing on coronal reformats. No parenchymal hemorrhage or swelling. No hydrocephalus or masslike finding. Vascular: No hyperdense vessel or unexpected calcification. Skull: Left anterior scalp hematoma without calvarial fracture. Sinuses/Orbits: No evidence of injury. Bilateral cataract resection and partial left mastoid opacification. CT CERVICAL SPINE FINDINGS Alignment: Degenerative 3 mm anterolisthesis at C4-5. Skull base and vertebrae: Negative for acute fracture. Soft tissues and spinal canal: No prevertebral fluid or swelling. No visible canal hematoma. Disc levels: Advanced lower cervical degenerative disc narrowing with endplate ridging especially at C5-6. Upper chest: Negative Critical Value/emergent results were called by telephone at the time of interpretation on 07/16/2019 at 4:15 am to [place] , who verbally acknowledged these results. IMPRESSION: 1. Trace acute subdural hematoma along the left frontal convexity, only 3 mm in thickness. 2. Left frontal scalp hematoma without calvarial fracture. 3. Negative for cervical spine fracture. Electronically Signed   By: Monte Fantasia M.D.   On: 07/16/2019 04:15   Ct Shoulder Left Wo Contrast  Result Date: 07/17/2019 CLINICAL DATA:  Shoulder fracture EXAM:  CT OF THE UPPER LEFT EXTREMITY WITHOUT CONTRAST TECHNIQUE: Multidetector CT imaging of the upper left extremity was performed according to the standard protocol. COMPARISON:  Radiograph 07/16/2019 FINDINGS: Bones/Joint/Cartilage Left AC joint is intact. No clavicle fracture. Left upper ribs are intact. No scapular fracture. Acute comminuted fracture involving the proximal shaft of the humerus, and extending to the anatomical neck. About 1/2 bone width displacement of the distal fracture fragment toward the midline with mild to moderate valgus angulation of distal fracture fragment. Impaction of the outer cortex of the humerus shaft into the humeral neck by approximately 14 mm. 19 mm horizontally oriented bone fragment anterior to the humeral neck with smaller 7 mm cortical bone fragment posterior to the neck. No humeral head dislocation. No definite articular surface extension of fracture lucency. Ligaments Suboptimally assessed by CT. Muscles and Tendons No significant atrophy.  No intramuscular fluid collection. Soft tissues Moderate edema within the left axillary and shoulder soft tissues. IMPRESSION: 1. Acute comminuted, impacted, displaced and angulated fracture involving the proximal shaft of the humerus with extension to the anatomical neck. No dislocation of the humeral head. 2. Moderate soft tissue edema at the left shoulder. Electronically Signed   By: Donavan Foil M.D.   On: 07/17/2019 02:17   Dg Chest Portable 1 View  Result Date: 07/16/2019 CLINICAL DATA:  Fall  EXAM: PORTABLE CHEST 1 VIEW COMPARISON:  02/24/2018 FINDINGS: No focal airspace disease or effusion. Cardiomediastinal silhouette is within normal limits. Aortic atherosclerosis. No pneumothorax. Acute displaced fracture involving the proximal left humerus. IMPRESSION: No active disease. Acute displaced fracture involving the proximal left humerus. Electronically Signed   By: Donavan Foil M.D.   On: 07/16/2019 03:57   Dg Shoulder Left  Portable  Result Date: 07/16/2019 CLINICAL DATA:  Fall EXAM: LEFT SHOULDER - 1 VIEW COMPARISON:  None. FINDINGS: Acute fracture involving the left humeral neck with about 1/2 bone wid anterior displacement distal fracture fragment and mild valgus angulation of distal fracture fragment. Humeral head does not appear dislocated. IMPRESSION: Acute displaced and angulated humeral neck fracture Electronically Signed   By: Donavan Foil M.D.   On: 07/16/2019 03:57    Microbiology: Recent Results (from the past 240 hour(s))  SARS CORONAVIRUS 2 (TAT 6-24 HRS) Nasopharyngeal Nasopharyngeal Swab     Status: None   Collection Time: 07/16/19  6:09 AM   Specimen: Nasopharyngeal Swab  Result Value Ref Range Status   SARS Coronavirus 2 NEGATIVE NEGATIVE Final    Comment: (NOTE) SARS-CoV-2 target nucleic acids are NOT DETECTED. The SARS-CoV-2 RNA is generally detectable in upper and lower respiratory specimens during the acute phase of infection. Negative results do not preclude SARS-CoV-2 infection, do not rule out co-infections with other pathogens, and should not be used as the sole basis for treatment or other patient management decisions. Negative results must be combined with clinical observations, patient history, and epidemiological information. The expected result is Negative. Fact Sheet for Patients: SugarRoll.be Fact Sheet for Healthcare Providers: https://www.woods-mathews.com/ This test is not yet approved or cleared by the [place] FDA and  has been authorized for detection and/or diagnosis of SARS-CoV-2 by FDA under an Emergency Use Authorization (EUA). This EUA will remain  in effect (meaning this test can be used) for the duration of the COVID-19 declaration under Section 56 4(b)(1) of the Act, 21 U.S.C. section 360bbb-3(b)(1), unless the authorization is terminated or revoked sooner. Performed at [facility], [place] [address],  [place], [place] [address]   Novel Coronavirus, NAA (Hosp order, Send-out to Ref Lab; TAT 18-24 hrs     Status: None   Collection Time: 07/20/19  4:26 PM   Specimen: Nasopharyngeal Swab; Respiratory  Result Value Ref Range Status   SARS-CoV-2, NAA NOT DETECTED NOT DETECTED Final    Comment: (NOTE) This nucleic acid amplification test was developed and its performance characteristics determined by [employer]. Nucleic acid amplification tests include PCR and TMA. This test has not been FDA cleared or approved. This test has been authorized by FDA under an Emergency Use Authorization (EUA). This test is only authorized for the duration of time the declaration that circumstances exist justifying the authorization of the emergency use of in vitro diagnostic tests for detection of SARS-CoV-2 virus and/or diagnosis of COVID-19 infection under section 564(b)(1) of the Act, 21 U.S.C. [id number]) (1), unless the authorization is terminated or revoked sooner. When diagnostic testing is negative, the possibility of a false negative result should be considered in the context of a patient's recent exposures and the presence of clinical signs and symptoms consistent with COVID-19. An individual without symptoms of COVID- 19 and who is not shedding SARS-CoV-2 vi rus would expect to have a negative (not detected) result in this assay. Performed At: [facility] [address] [place], [place] [id number] Rush Farmer MD [id number]  Source NASOPHARYNGEAL  Final    Comment: Performed at [facility], [place] [address] [place], [place] [address]  Culture, Urine     Status: Abnormal   Collection Time: 07/22/19  5:00 AM   Specimen: Urine, Random  Result Value Ref Range Status   Specimen Description URINE, RANDOM  Final   Special Requests   Final    NONE Performed at [facility], [place] [address] [place], [place] [address]    Culture MULTIPLE SPECIES  PRESENT, SUGGEST RECOLLECTION (A)  Final   Report Status 07/23/2019 FINAL  Final     Labs: Basic Metabolic Panel: Recent Labs  Lab 07/18/19 0316 07/22/19 0410  NA 139 139  K 3.8 4.0  CL 102 111  CO2 25 19*  GLUCOSE 115* 125*  BUN 25* 28*  CREATININE 0.98 0.94  CALCIUM 9.7 9.3  MG 1.9  --    Liver Function Tests: No results for input(s): AST, ALT, ALKPHOS, BILITOT, PROT, ALBUMIN in the last 168 hours. No results for input(s): LIPASE, AMYLASE in the last 168 hours. No results for input(s): AMMONIA in the last 168 hours. CBC: Recent Labs  Lab 07/22/19 0410  WBC 5.9  HGB 8.7*  HCT 27.2*  MCV 96.5  PLT 213   Cardiac Enzymes: No results for input(s): CKTOTAL, CKMB, CKMBINDEX, TROPONINI in the last 168 hours. BNP: BNP (last 3 results) No results for input(s): BNP in the last 8760 hours.  ProBNP (last 3 results) No results for input(s): PROBNP in the last 8760 hours.  CBG: Recent Labs  Lab 07/21/19 1127 07/21/19 1600 07/22/19 0613 07/23/19 0701 07/24/19 0609  GLUCAP 104* 195* 114* 114* 121*       Signed:  Kayleen Memos, MD [facility] 07/24/2019, 1:30 PM

## 2019-07-24 NOTE — Progress Notes (Signed)
Orthopedic Tech Progress Note Patient Details:  Melinda Maxwell 1941-01-25 [id number] Therapy called requesting a bigger sling, patient had on a pediatric size sling Ortho Devices Type of Ortho Device: Shoulder immobilizer Ortho Device/Splint Location: ULE Ortho Device/Splint Interventions: Adjustment, Application, Ordered   Post Interventions Patient Tolerated: Well Instructions Provided: Care of device, Adjustment of device   Janit Pagan 07/24/2019, 4:17 PM

## 2019-07-25 LAB — GLUCOSE, CAPILLARY: Glucose-Capillary: 91 mg/dL (ref 70–99)

## 2019-07-25 MED ORDER — ASPIRIN 81 MG PO CHEW: 81.0000 mg | CHEWABLE_TABLET | Freq: Every day | ORAL | 0 refills | Status: AC

## 2019-07-25 MED ORDER — APIXABAN 5 MG PO TABS: 5.0000 mg | ORAL_TABLET | Freq: Two times a day (BID) | ORAL | 0 refills | Status: DC

## 2019-07-25 MED ORDER — HYDRALAZINE HCL 10 MG PO TABS: 10.0000 mg | ORAL_TABLET | Freq: Three times a day (TID) | ORAL | 0 refills | Status: DC

## 2019-07-25 MED ORDER — MECLIZINE HCL 12.5 MG PO TABS: 12.5000 mg | ORAL_TABLET | Freq: Two times a day (BID) | ORAL | 0 refills | Status: DC | PRN

## 2019-07-25 NOTE — TOC Transition Note (Signed)
Transition of Care [facility]) - CM/SW Discharge Note   Patient Details  Name: Melinda Maxwell MRN: [id number] Date of Birth: [birth year]  Transition of Care [facility]) CM/SW Contact:  Gelene Mink, [place] Phone Number: 07/25/2019, 12:19 PM   Clinical Narrative:     Patient will DC to: [facility] Anticipated DC date: 07/25/2019 Family notified: Yes Transport by: Corey Harold   Per MD patient ready for DC to . RN, patient, patient's family, and facility notified of DC. Discharge Summary and FL2 sent to facility. RN to call report prior to discharge [phone]). The patient will go to room 302.  DC packet on chart. Ambulance transport requested for patient.   CSW will sign off for now as social work intervention is no longer needed. Please consult [place] again if new needs arise.  Kaylenn Civil, LCSW-A [place]/Clinical Social Work Department Cell: [phone]     Final next level of care: [place] Barriers to Discharge: No Barriers Identified   Patient Goals and CMS Choice Patient states their goals for this hospitalization and ongoing recovery are:: Pt will go to [facility] for rehab CMS Medicare.gov Compare Post Acute Care list provided to:: Patient Choice offered to / list presented to : Patient  Discharge Placement   Existing PASRR number confirmed : 07/25/19          Patient chooses bed at: [place] Patient to be transferred to facility by: [place] Name of family member notified: [name] Patient and family notified of of transfer: 07/25/19  Discharge Plan and Services In-house Referral: Clinical Social Work Discharge Planning Services: NA Post Acute Care Choice: [place]          DME Arranged: N/A DME Agency: NA       HH Arranged: NA HH Agency: NA        Social Determinants of Health (SDOH) Interventions     Readmission Risk Interventions No flowsheet data found.

## 2019-07-25 NOTE — Progress Notes (Addendum)
Nurse called [facility] facility and gave report to nurse April. Daughter at bedside, she packed all of patient belongings in personal bag and sent with [facility]. Telemetry discontinued and IV removed. Patient transported off unit via stretcher by [facility] with discharge instructions and Med prescriptions. Gwendolyn Grant, RN

## 2019-07-25 NOTE — Discharge Summary (Signed)
Discharge Summary  Melinda Maxwell [id number] DOB: January 01, 1941  PCP: [name], MD  Admit date: 07/16/2019 Discharge date: 07/25/2019  Time spent: 35 minutes   Recommendations for Outpatient Follow-up:  1. Follow-up with cardiology 2. Follow-up with orthopedic surgery 3. Follow up with your PCP 4. Please obtain pulmonology referral from your PCP for possible polysomnography. 5. Continue PT at SNF 6. Take your medications as prescribed 7. Fall precautions    Discharge Diagnoses:  Active Hospital Problems   Diagnosis Date Noted   Subdural hematoma ([place]) 07/16/2019   Closed displaced fracture of surgical neck of left humerus 07/16/2019   Non-sustained ventricular tachycardia (HCC) 07/16/2019   Chronic diastolic CHF (congestive heart failure) ([place]) 07/16/2019   CKD (chronic kidney disease), stage III 07/16/2019   Syncope    CAD (coronary artery disease) 03/26/2018   Chronic deep vein thrombosis (DVT) ([place]) 08/10/2014   Diabetes mellitus with stage 3 chronic kidney disease ([place]) 07/09/2014    Resolved Hospital Problems  No resolved problems to display.    Discharge Condition: Stable  Diet recommendation: Resume previous diet  Vitals:   07/25/19 0900 07/25/19 1130  BP: (!) 151/73 (!) 170/85  Pulse: 67 62  Resp: 18 17  Temp: 98.1 F (36.7 C)   SpO2: 98% 99%    History of present illness:  ARELI WEIR [id number] y.o.femalewith medical history significant forcoronary artery disease, chronic diastolic CHF, DVT on Xarelto, frequent PVCs, and sleep apnea, now presenting to the emergency department with headache and left arm pain after a fall and syncopal episodeat home. Work up revealed acute displaced fracture of the proximal left humerus, noncontrast head CT reveals subdural hematoma along the left frontal convexity, 3 mm in thickness, and no acute cervical spine fracture identified on CT.Neurosurgery and orthopedic surgeryconsultedas well as  cardiology for syncope.  Repeat CT head no contrast done on 07/22/2019 showed near resolution of subdural hematoma.  Per neurosurgery, Dr. Ronnald Ramp, on 07/23/2019, okay to resume oral anticoagulation.  Discharge was delayed on 07/22/2019 due to complaint of dizziness, work-up unrevealing, CT head no contrast showed near resolution of subdural hematoma, orthostatic vital signs unrevealing.  Urine culture, multiple species present suggesting recollection.  Received 1 day of IV Rocephin on 07/22/2019.  On 07/23/2019 received 1 dose of fosfomycin.  She will need to follow-up with cardiology, orthopedic surgery, and her PCP posthospitalization.  She will need to obtain pulmonology referral from PCP for possible polysomnography.  She will also need to continue physical therapy with fall precautions.  Repeat Covid-19 done on 07/20/2019- not detected.   07/25/19: Patient was seen and examined at bedside.  No acute events overnight.  She is alert oriented x4 she has no new complaint.  Patient is stable for discharge to SNF.   Hospital Course:  Principal Problem:   Subdural hematoma (HCC) Active Problems:   CAD (coronary artery disease)   Chronic deep vein thrombosis (DVT) (HCC)   Diabetes mellitus with stage 3 chronic kidney disease (HCC)   Closed displaced fracture of surgical neck of left humerus   Non-sustained ventricular tachycardia (HCC)   Chronic diastolic CHF (congestive heart failure) (HCC)   CKD (chronic kidney disease), stage III   Syncope   Syncope possibly cardiogenic X2 in the last 1 week History of nonsustained V. tach and P. A. Fib Seen by cardiology, signed off on 07/17/2019. Cardiology recommended aspirin 81 mg daily, Eliquis 5 mg twice daily starting on 07/21/2019. Patient to follow-up with her outpatient cardiologist for outpatient  implanted loop recorder   Resolved dizziness, rule out central etiology CT head no contrast ordered to further assess, showed near resolution  of subdural hematoma Positive orthostatic vital signs on 07/23/2019 Started on normal saline 50 cc/h x 1 day on 07/24/2019 Denies dizziness at the time of this visit Urine culture suggestive recollection due to multiple species present Received 1 day of Rocephin from 10/14 through 07/23/2019 and 1 dose of fosfomycin on 07/23/2019 Continue PRN meclizine for dizziness Continue physical therapy at SNF with fall precautions  Traumatic subdural hematoma post fall Seen by neurosurgery No new hemorrhage on repeated CT head, near resolution Okay to resume anticoagulation per neurosurgery  History of OSA Self reported was tested and was on CPAP about 5 years ago Lost to follow up with her pulmonologist Recommend polysomnography  Recommend pulmonology referral by PCP for possible polysomnography  Orthostatic hypotension Positive orthostatic vital signs on 07/23/2019 Started on gentle IV fluid hydration normal saline at 50 cc/h x 1 day Closely monitor volume status Denies dizziness on 07/24/2019  Resolved Questionable hallucinations Reported on 07/23/2019 by bedside RN Anderson Malta No hallucinations reported on 07/24/2019, confirmed by bedside RN Amalia Hailey. No hallucinations on 07/25/19.  Paroxysmal atrial fibrillationwith RVR -Previously on Xarelto, switched to Eliquis starting 10/13 Rate controlled on cardizem Follow-up with your cardiologist  Uncontrolled hypertension Continue p.o. Cardizem 30 mg twice daily Started on p.o. hydralazine 10 mg 3 times daily on 07/22/2019 Increased dose of hydralazine to 25 mg 3 times daily on 07/23/2019 Follow-up with your PCP  Resolved sinus bradycardia, iatrogenic in the setting of beta blockers  Displaced fracture of left humeral neck -S/p fall -Orthopedic surgery consulted -CT shoulder revealedacute comminuted, impacted, displaced and angulated fracture involving the proximal shaft of the humerus with extension to the anatomical neck. No  dislocation of the humeral head. -Nonsurgical treatment. Continue left shoulder gentle PROM with sling for support and comfort. Active motion of elbow forearm wrist and hand. -Follow-up with Dr. Marcelino Scot in 10 to 14 days  Non-sustained VT -Patient with multiple PVCs on telemetry on my exam, noted NSVT by EMS en route -Cardiology recommends outpatient loop recorder placement by her primary cardiology  CAD -S/p PCIof LAD inMay 2019 -Follows withDr[facility]iah [facility] -Continue zocor  Chronic diastolic heart failure -Euvolemic, stable at this time -Demadex discontinued due to orthostatic hypotension  Chronic kidney disease stage III -Baseline creatinine1.1-1.48 -Stable   History of DVT -Xarelton PTA. Switchedto Eliquis, started 10/13  Pyuria U/A done on 07/22/19  showed pyuria Unclear if related to her dizziness Urine cx showed multiple species present and suggestive recollection Received 1 day of IV Rocephin Obtained 1 dose of fosfomycin on 07/23/2019  Physical debility/ambulatory dysfunction PT recs SNF CSW assisting w placement     DVT prophylaxis:Eliquis Code Status:Full code    Consultants:  Orthopedic surgery  Neurosurgery  Cardiology   Discharge Exam: BP (!) 170/85 (BP Location: Right Arm)    Pulse 62    Temp 98.1 F (36.7 C) (Oral)    Resp 17    Ht 5\' 1"  (1.549 m)    Wt 71.2 kg    SpO2 99%    BMI 29.66 kg/m   General: 78 y.o. year-old female well-developed well-nourished no acute distress.  Alert and oriented x4.    Cardiovascular: Regular rate and rhythm no rubs or gallops no JVD or thyromegaly  Respiratory: Clear to auscultation.  No wheezes or rales.  Poor inspiratory effort.    Abdomen: Soft nontender nondistended  bowel sounds present.  Musculoskeletal: No lower extremity edema.  2 out of 4 pulses in all 4 extremities.    Psychiatry: Mood is appropriate for condition and setting.  Discharge  Instructions You were cared for by a hospitalist during your hospital stay. If you have any questions about your discharge medications or the care you received while you were in the hospital after you are discharged, you can call the unit and asked to speak with the hospitalist on call if the hospitalist that took care of you is not available. Once you are discharged, your primary care physician will handle any further medical issues. Please note that NO REFILLS for any discharge medications will be authorized once you are discharged, as it is imperative that you return to your primary care physician (or establish a relationship with a primary care physician if you do not have one) for your aftercare needs so that they can reassess your need for medications and monitor your lab values.   Allergies as of 07/25/2019      Reactions   Benzocaine Other (See Comments)   Unknown   Lisinopril Cough   Neomycin Other (See Comments)   On allergy testing   Sulfamethoxazole-trimethoprim Other (See Comments)   Unknown   Sulfa Antibiotics Rash      Medication List    STOP taking these medications   clopidogrel 75 MG tablet Commonly known as: PLAVIX   potassium chloride 10 MEQ tablet Commonly known as: KLOR-CON   rivaroxaban 20 MG Tabs tablet Commonly known as: XARELTO   torsemide 20 MG tablet Commonly known as: DEMADEX     TAKE these medications   ALLERGY EYE OP Place 1 drop into both eyes daily as needed (for dry eyes).   allopurinol 300 MG tablet Commonly known as: ZYLOPRIM Take 300 mg by mouth daily.   apixaban 5 MG Tabs tablet Commonly known as: ELIQUIS Take 1 tablet (5 mg total) by mouth 2 (two) times daily.   aspirin 81 MG chewable tablet Chew 1 tablet (81 mg total) by mouth daily.   colchicine 0.6 MG tablet Take by mouth.   diltiazem 30 MG tablet Commonly known as: CARDIZEM Take 30 mg by mouth 2 (two) times daily.   hydrALAZINE 10 MG tablet Commonly known as:  APRESOLINE Take 1 tablet (10 mg total) by mouth every 8 (eight) hours.   meclizine 12.5 MG tablet Commonly known as: ANTIVERT Take 1 tablet (12.5 mg total) by mouth 2 (two) times daily as needed for dizziness.   nitroGLYCERIN 0.4 MG SL tablet Commonly known as: NITROSTAT Place 0.4 mg under the tongue every 5 (five) minutes as needed for chest pain.   nystatin powder Commonly known as: MYCOSTATIN/NYSTOP Apply 1 g topically daily as needed. areas or skinfolds for yeast   pantoprazole 40 MG tablet Commonly known as: PROTONIX Take 40 mg by mouth daily.   simvastatin 10 MG tablet Commonly known as: ZOCOR Take 10 mg by mouth daily. HLD- reduced d/t interaction with Diltiazem- increased r/f Rhabdomyelosis   Vitamin D3 50 MCG (2000 UT) capsule Take by mouth.      Allergies  Allergen Reactions   Benzocaine Other (See Comments)    Unknown   Lisinopril Cough   Neomycin Other (See Comments)    On allergy testing   Sulfamethoxazole-Trimethoprim Other (See Comments)    Unknown   Sulfa Antibiotics Rash   Follow-up Information    Altamese Coats[place], MD. Call in 1 day(s).   Specialty: Orthopedic Surgery Why:  Please call for a post hospital follow-up appointment. Contact information: [place] [address] [phone]        Corey Skains, MD. Call in 1 day(s).   Specialty: Cardiology Why: Please call for a post hospital follow-up appointment. Contact information: [address] [facility] [place] [address] [phone]            The results of significant diagnostics from this hospitalization (including imaging, microbiology, ancillary and laboratory) are listed below for reference.    Significant Diagnostic Studies: Dg Elbow 2 Views Left  Result Date: 07/16/2019 CLINICAL DATA:  Fall EXAM: LEFT ELBOW - 2 VIEW COMPARISON:  None. FINDINGS: There is no evidence of fracture, dislocation, or joint effusion. There  is no evidence of arthropathy or other focal bone abnormality. Soft tissues are unremarkable. IMPRESSION: Negative. Electronically Signed   By: Donavan Foil M.D.   On: 07/16/2019 03:58   Ct Head Wo Contrast  Result Date: 07/22/2019 CLINICAL DATA:  Followup subdural hematoma. EXAM: CT HEAD WITHOUT CONTRAST TECHNIQUE: Contiguous axial images were obtained from the base of the skull through the vertex without intravenous contrast. COMPARISON:  07/17/2019. 07/16/2019. FINDINGS: Brain: Complete resolution of previously seen minor left-sided subdural hematoma. No evidence of intracranial hemorrhage at the present time. The brain shows atrophy with chronic small-vessel ischemic changes of the white matter. No mass, hydrocephalus or extra-axial collection. Vascular: There is atherosclerotic calcification of the major vessels at the base of the brain. Skull: No skull fracture. Sinuses/Orbits: Clear/normal Other: Left frontal scalp hematoma is getting smaller. IMPRESSION: 1. Complete resolution of previously seen minor left-sided subdural hematoma. No evidence of intracranial hemorrhage at the present time. 2. Atrophy and chronic small-vessel ischemic changes of the white matter. 3. Left frontal scalp hematoma is getting smaller. Electronically Signed   By: Nelson Chimes M.D.   On: 07/22/2019 16:08   Ct Head Wo Contrast  Result Date: 07/17/2019 CLINICAL DATA:  Subdural hematoma follow-up EXAM: CT HEAD WITHOUT CONTRAST TECHNIQUE: Contiguous axial images were obtained from the base of the skull through the vertex without intravenous contrast. COMPARISON:  07/16/2019 CT head FINDINGS: Brain: Redistribution of trace left convexity subdural blood. No new hemorrhage. The blood is now primarily along the left tentorial leaflet. There is periventricular hypoattenuation compatible with chronic microvascular disease. Vascular: Atherosclerotic calcification of the vertebral and internal carotid arteries at the skull base. No  abnormal hyperdensity of the major intracranial arteries or dural venous sinuses. Skull: Unchanged size of left frontoparietal scalp hematoma. No skull fracture. Sinuses/Orbits: No fluid levels or advanced mucosal thickening of the visualized paranasal sinuses. No mastoid or middle ear effusion. The orbits are normal. IMPRESSION: 1. Redistribution of trace left convexity subdural blood, now primarily along the left tentorial leaflet. No new hemorrhage. 2. Unchanged size of left frontoparietal scalp hematoma. Electronically Signed   By: Ulyses Jarred M.D.   On: 07/17/2019 02:03   Ct Head Wo Contrast  Result Date: 07/16/2019 CLINICAL DATA:  Posttraumatic headache EXAM: CT HEAD WITHOUT CONTRAST CT CERVICAL SPINE WITHOUT CONTRAST TECHNIQUE: Multidetector CT imaging of the head and cervical spine was performed following the standard protocol without intravenous contrast. Multiplanar CT image reconstructions of the cervical spine were also generated. COMPARISON:  None. FINDINGS: CT HEAD FINDINGS Brain: 3 mm high-density subdural collection along the lateral left frontal convexity, especially convincing on coronal reformats. No parenchymal hemorrhage or swelling. No hydrocephalus or masslike finding. Vascular: No hyperdense vessel or unexpected calcification. Skull: Left anterior  scalp hematoma without calvarial fracture. Sinuses/Orbits: No evidence of injury. Bilateral cataract resection and partial left mastoid opacification. CT CERVICAL SPINE FINDINGS Alignment: Degenerative 3 mm anterolisthesis at C4-5. Skull base and vertebrae: Negative for acute fracture. Soft tissues and spinal canal: No prevertebral fluid or swelling. No visible canal hematoma. Disc levels: Advanced lower cervical degenerative disc narrowing with endplate ridging especially at C5-6. Upper chest: Negative Critical Value/emergent results were called by telephone at the time of interpretation on 07/16/2019 at 4:15 am to [place] , who  verbally acknowledged these results. IMPRESSION: 1. Trace acute subdural hematoma along the left frontal convexity, only 3 mm in thickness. 2. Left frontal scalp hematoma without calvarial fracture. 3. Negative for cervical spine fracture. Electronically Signed   By: Monte Fantasia M.D.   On: 07/16/2019 04:15   Ct Cervical Spine Wo Contrast  Result Date: 07/16/2019 CLINICAL DATA:  Posttraumatic headache EXAM: CT HEAD WITHOUT CONTRAST CT CERVICAL SPINE WITHOUT CONTRAST TECHNIQUE: Multidetector CT imaging of the head and cervical spine was performed following the standard protocol without intravenous contrast. Multiplanar CT image reconstructions of the cervical spine were also generated. COMPARISON:  None. FINDINGS: CT HEAD FINDINGS Brain: 3 mm high-density subdural collection along the lateral left frontal convexity, especially convincing on coronal reformats. No parenchymal hemorrhage or swelling. No hydrocephalus or masslike finding. Vascular: No hyperdense vessel or unexpected calcification. Skull: Left anterior scalp hematoma without calvarial fracture. Sinuses/Orbits: No evidence of injury. Bilateral cataract resection and partial left mastoid opacification. CT CERVICAL SPINE FINDINGS Alignment: Degenerative 3 mm anterolisthesis at C4-5. Skull base and vertebrae: Negative for acute fracture. Soft tissues and spinal canal: No prevertebral fluid or swelling. No visible canal hematoma. Disc levels: Advanced lower cervical degenerative disc narrowing with endplate ridging especially at C5-6. Upper chest: Negative Critical Value/emergent results were called by telephone at the time of interpretation on 07/16/2019 at 4:15 am to [place] , who verbally acknowledged these results. IMPRESSION: 1. Trace acute subdural hematoma along the left frontal convexity, only 3 mm in thickness. 2. Left frontal scalp hematoma without calvarial fracture. 3. Negative for cervical spine fracture. Electronically Signed    By: Monte Fantasia M.D.   On: 07/16/2019 04:15   Ct Shoulder Left Wo Contrast  Result Date: 07/17/2019 CLINICAL DATA:  Shoulder fracture EXAM: CT OF THE UPPER LEFT EXTREMITY WITHOUT CONTRAST TECHNIQUE: Multidetector CT imaging of the upper left extremity was performed according to the standard protocol. COMPARISON:  Radiograph 07/16/2019 FINDINGS: Bones/Joint/Cartilage Left AC joint is intact. No clavicle fracture. Left upper ribs are intact. No scapular fracture. Acute comminuted fracture involving the proximal shaft of the humerus, and extending to the anatomical neck. About 1/2 bone width displacement of the distal fracture fragment toward the midline with mild to moderate valgus angulation of distal fracture fragment. Impaction of the outer cortex of the humerus shaft into the humeral neck by approximately 14 mm. 19 mm horizontally oriented bone fragment anterior to the humeral neck with smaller 7 mm cortical bone fragment posterior to the neck. No humeral head dislocation. No definite articular surface extension of fracture lucency. Ligaments Suboptimally assessed by CT. Muscles and Tendons No significant atrophy.  No intramuscular fluid collection. Soft tissues Moderate edema within the left axillary and shoulder soft tissues. IMPRESSION: 1. Acute comminuted, impacted, displaced and angulated fracture involving the proximal shaft of the humerus with extension to the anatomical neck. No dislocation of the humeral head. 2. Moderate soft tissue edema at the left shoulder. Electronically Signed  By: Donavan Foil M.D.   On: 07/17/2019 02:17   Dg Chest Portable 1 View  Result Date: 07/16/2019 CLINICAL DATA:  Fall EXAM: PORTABLE CHEST 1 VIEW COMPARISON:  02/24/2018 FINDINGS: No focal airspace disease or effusion. Cardiomediastinal silhouette is within normal limits. Aortic atherosclerosis. No pneumothorax. Acute displaced fracture involving the proximal left humerus. IMPRESSION: No active disease. Acute  displaced fracture involving the proximal left humerus. Electronically Signed   By: Donavan Foil M.D.   On: 07/16/2019 03:57   Dg Shoulder Left Portable  Result Date: 07/16/2019 CLINICAL DATA:  Fall EXAM: LEFT SHOULDER - 1 VIEW COMPARISON:  None. FINDINGS: Acute fracture involving the left humeral neck with about 1/2 bone wid anterior displacement distal fracture fragment and mild valgus angulation of distal fracture fragment. Humeral head does not appear dislocated. IMPRESSION: Acute displaced and angulated humeral neck fracture Electronically Signed   By: Donavan Foil M.D.   On: 07/16/2019 03:57    Microbiology: Recent Results (from the past 240 hour(s))  SARS CORONAVIRUS 2 (TAT 6-24 HRS) Nasopharyngeal Nasopharyngeal Swab     Status: None   Collection Time: 07/16/19  6:09 AM   Specimen: Nasopharyngeal Swab  Result Value Ref Range Status   SARS Coronavirus 2 NEGATIVE NEGATIVE Final    Comment: (NOTE) SARS-CoV-2 target nucleic acids are NOT DETECTED. The SARS-CoV-2 RNA is generally detectable in upper and lower respiratory specimens during the acute phase of infection. Negative results do not preclude SARS-CoV-2 infection, do not rule out co-infections with other pathogens, and should not be used as the sole basis for treatment or other patient management decisions. Negative results must be combined with clinical observations, patient history, and epidemiological information. The expected result is Negative. Fact Sheet for Patients: SugarRoll.be Fact Sheet for Healthcare Providers: https://www.woods-mathews.com/ This test is not yet approved or cleared by the [place] FDA and  has been authorized for detection and/or diagnosis of SARS-CoV-2 by FDA under an Emergency Use Authorization (EUA). This EUA will remain  in effect (meaning this test can be used) for the duration of the COVID-19 declaration under Section 56 4(b)(1) of the Act, 21  U.S.C. section 360bbb-3(b)(1), unless the authorization is terminated or revoked sooner. Performed at [facility], [place] [address], [place], [place] [address]   Novel Coronavirus, NAA (Hosp order, Send-out to Ref Lab; TAT 18-24 hrs     Status: None   Collection Time: 07/20/19  4:26 PM   Specimen: Nasopharyngeal Swab; Respiratory  Result Value Ref Range Status   SARS-CoV-2, NAA NOT DETECTED NOT DETECTED Final    Comment: (NOTE) This nucleic acid amplification test was developed and its performance characteristics determined by [employer]. Nucleic acid amplification tests include PCR and TMA. This test has not been FDA cleared or approved. This test has been authorized by FDA under an Emergency Use Authorization (EUA). This test is only authorized for the duration of time the declaration that circumstances exist justifying the authorization of the emergency use of in vitro diagnostic tests for detection of SARS-CoV-2 virus and/or diagnosis of COVID-19 infection under section 564(b)(1) of the Act, 21 U.S.C. [id number]) (1), unless the authorization is terminated or revoked sooner. When diagnostic testing is negative, the possibility of a false negative result should be considered in the context of a patient's recent exposures and the presence of clinical signs and symptoms consistent with COVID-19. An individual without symptoms of COVID- 19 and who is not shedding SARS-CoV-2 vi rus would expect to have a negative (  not detected) result in this assay. Performed At: [facility] [address] [place], [place] [id number] Rush Farmer MD [id number]    Tiro  Final    Comment: Performed at [facility], [place] [address], [place], [place] [address]  Culture, Urine     Status: Abnormal   Collection Time: 07/22/19  5:00 AM   Specimen: Urine, Random  Result Value Ref Range Status   Specimen Description URINE, RANDOM  Final    Special Requests   Final    NONE Performed at [facility], [place] [address], [place], [place] [address]    Culture MULTIPLE SPECIES PRESENT, SUGGEST RECOLLECTION (A)  Final   Report Status 07/23/2019 FINAL  Final  SARS CORONAVIRUS 2 (TAT 6-24 HRS) Nasopharyngeal Nasopharyngeal Swab     Status: None   Collection Time: 07/24/19  2:46 PM   Specimen: Nasopharyngeal Swab  Result Value Ref Range Status   SARS Coronavirus 2 NEGATIVE NEGATIVE Final    Comment: (NOTE) SARS-CoV-2 target nucleic acids are NOT DETECTED. The SARS-CoV-2 RNA is generally detectable in upper and lower respiratory specimens during the acute phase of infection. Negative results do not preclude SARS-CoV-2 infection, do not rule out co-infections with other pathogens, and should not be used as the sole basis for treatment or other patient management decisions. Negative results must be combined with clinical observations, patient history, and epidemiological information. The expected result is Negative. Fact Sheet for Patients: SugarRoll.be Fact Sheet for Healthcare Providers: [email or web address] This test is not yet approved or cleared by the [place] FDA and  has been authorized for detection and/or diagnosis of SARS-CoV-2 by FDA under an Emergency Use Authorization (EUA). This EUA will remain  in effect (meaning this test can be used) for the duration of the COVID-19 declaration under Section 56 4(b)(1) of the Act, 21 U.S.C. section 360bbb-3(b)(1), unless the authorization is terminated or revoked sooner. Performed at [facility], [place] [address] [place], [place] [address]      Labs: Basic Metabolic Panel: Recent Labs  Lab 07/22/19 0410  NA 139  K 4.0  CL 111  CO2 19*  GLUCOSE 125*  BUN 28*  CREATININE 0.94  CALCIUM 9.3   Liver Function Tests: No results for input(s): AST, ALT, ALKPHOS, BILITOT, PROT, ALBUMIN in the last 168  hours. No results for input(s): LIPASE, AMYLASE in the last 168 hours. No results for input(s): AMMONIA in the last 168 hours. CBC: Recent Labs  Lab 07/22/19 0410  WBC 5.9  HGB 8.7*  HCT 27.2*  MCV 96.5  PLT 213   Cardiac Enzymes: No results for input(s): CKTOTAL, CKMB, CKMBINDEX, TROPONINI in the last 168 hours. BNP: BNP (last 3 results) No results for input(s): BNP in the last 8760 hours.  ProBNP (last 3 results) No results for input(s): PROBNP in the last 8760 hours.  CBG: Recent Labs  Lab 07/21/19 1600 07/22/19 0613 07/23/19 0701 07/24/19 0609 07/25/19 0624  GLUCAP 195* 114* 114* 121* 91       Signed:  Kayleen Memos, MD [facility] 07/25/2019, 11:58 AM

## 2019-10-15 ENCOUNTER — Other Ambulatory Visit: Payer: Self-pay

## 2019-10-15 ENCOUNTER — Emergency Department: Payer: Medicare Other

## 2019-10-15 ENCOUNTER — Emergency Department
Admission: EM | Admit: 2019-10-15 | Discharge: 2019-10-16 | Disposition: A | Discharge status: Home / Self Care | Payer: Medicare Other | Attending: Emergency Medicine | Admitting: Emergency Medicine

## 2019-10-15 DIAGNOSIS — I5032 Chronic diastolic (congestive) heart failure: Secondary | ICD-10-CM | POA: Diagnosis not present

## 2019-10-15 DIAGNOSIS — N3001 Acute cystitis with hematuria: Secondary | ICD-10-CM

## 2019-10-15 DIAGNOSIS — I252 Old myocardial infarction: Secondary | ICD-10-CM | POA: Diagnosis not present

## 2019-10-15 DIAGNOSIS — Z79899 Other long term (current) drug therapy: Secondary | ICD-10-CM | POA: Diagnosis not present

## 2019-10-15 DIAGNOSIS — Z7982 Long term (current) use of aspirin: Secondary | ICD-10-CM | POA: Diagnosis not present

## 2019-10-15 DIAGNOSIS — I2510 Atherosclerotic heart disease of native coronary artery without angina pectoris: Secondary | ICD-10-CM | POA: Insufficient documentation

## 2019-10-15 DIAGNOSIS — I130 Hypertensive heart and chronic kidney disease with heart failure and stage 1 through stage 4 chronic kidney disease, or unspecified chronic kidney disease: Secondary | ICD-10-CM | POA: Diagnosis not present

## 2019-10-15 DIAGNOSIS — Z7901 Long term (current) use of anticoagulants: Secondary | ICD-10-CM | POA: Diagnosis not present

## 2019-10-15 DIAGNOSIS — J189 Pneumonia, unspecified organism: Secondary | ICD-10-CM | POA: Insufficient documentation

## 2019-10-15 DIAGNOSIS — J45909 Unspecified asthma, uncomplicated: Secondary | ICD-10-CM | POA: Insufficient documentation

## 2019-10-15 DIAGNOSIS — N183 Chronic kidney disease, stage 3 unspecified: Secondary | ICD-10-CM | POA: Insufficient documentation

## 2019-10-15 DIAGNOSIS — G4751 Confusional arousals: Secondary | ICD-10-CM | POA: Diagnosis present

## 2019-10-15 DIAGNOSIS — E1122 Type 2 diabetes mellitus with diabetic chronic kidney disease: Secondary | ICD-10-CM | POA: Insufficient documentation

## 2019-10-15 DIAGNOSIS — G20 Parkinson's disease: Secondary | ICD-10-CM | POA: Diagnosis not present

## 2019-10-15 DIAGNOSIS — F0281 Dementia in other diseases classified elsewhere with behavioral disturbance: Secondary | ICD-10-CM | POA: Insufficient documentation

## 2019-10-15 HISTORY — DX: Parkinson's disease: G20

## 2019-10-15 NOTE — ED Triage Notes (Signed)
Pt arrives via EMS from home after her daughter states that she has not been acting herself- pt was diagnosed with a UTI and has taken 1 dose of her antibiotic- pt has a hx of parkinsons

## 2019-10-15 NOTE — ED Provider Notes (Signed)
[facility] Emergency Department Provider Note  ____________________________________________  Time seen: Approximately 11:54 PM  I have reviewed the triage vital signs and the nursing notes.   HISTORY  Chief Complaint Urinary Tract Infection  Level 5 caveat:  Portions of the history and physical were unable to be obtained due to slight confusion   HPI Melinda Maxwell is a 79 y.o. female with a history of CAD, hypertension, hyperlipidemia, Parkinson's, dementia, asthma, diabetes, subdural hematoma A. fib on Eliquis, V tach, CKD who was brought in for confusion.   Patient reports that she was diagnosed with a UTI yesterday and started on antibiotics.  Patient reports that she feels slightly confused.  He seems like patient's daughter was concerned that she was not acting like herself and requested that she be transferred to the hospital.  Patient denies headache or fall, chest pain, abdominal pain, flank pain, dysuria, fever, nausea, vomiting.  She does complain of a mild cough but denies shortness of breath.  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   Parkinson's disease [facility])    Sleep apnea     Patient Active Problem List   Diagnosis Date Noted   Subdural hematoma ([place]) 07/16/2019   Closed displaced fracture of surgical neck of left humerus 07/16/2019   Non-sustained ventricular tachycardia ([place]) 07/16/2019   Chronic diastolic CHF (congestive heart failure) ([place]) 07/16/2019   CKD (chronic kidney disease), stage III 07/16/2019   Syncope    Fall    CAD (coronary artery disease) 03/26/2018   Generalized weakness 03/26/2018   Paroxysmal atrial fibrillation ([place])  02/27/2018   UTI (urinary tract infection) 02/18/2018   Stable angina ([place]) 01/15/2018   Hyperlipidemia, mixed 09/10/2014   Moderate tricuspid insufficiency 09/10/2014   Chronic deep vein thrombosis (DVT) ([place]) 08/10/2014   Diabetes mellitus with stage 3 chronic kidney disease ([place]) 07/09/2014   Benign essential hypertension 02/21/2014   Gout 02/21/2014   OSA (obstructive sleep apnea) 02/21/2014    Past Surgical History:  Procedure Laterality Date   ABDOMINAL HYSTERECTOMY     APPENDECTOMY     BREAST LUMPECTOMY     L breast   CATARACT EXTRACTION     COLONOSCOPY     08/27/1989, 01/03/1998, 06/09/2004   CORONARY STENT INTERVENTION N/A 02/11/2018   Procedure: CORONARY STENT INTERVENTION;  Surgeon: Nelva Bush, MD;  Location: [place] [facility];  Service: Cardiovascular;  Laterality: N/A;   ESOPHAGOGASTRODUODENOSCOPY  06/09/2004   FLEXIBLE SIGMOIDOSCOPY     HEMICOLECTOMY     LEFT HEART CATH AND CORONARY ANGIOGRAPHY Left 02/11/2018   Procedure: LEFT HEART CATH AND CORONARY ANGIOGRAPHY;  Surgeon: Corey Skains, MD;  Location: [place] [facility];  Service: Cardiovascular;  Laterality: Left;   ORIF ANKLE FRACTURE  2004    Prior to Admission medications   Medication Sig Start Date End Date Taking? Authorizing Provider  allopurinol (ZYLOPRIM) 300 MG tablet Take 300 mg by mouth daily.    [provider]  amoxicillin-clavulanate (AUGMENTIN) 875-125 MG tablet Take 1 tablet by mouth 2 (two) times daily for 10 days. 10/16/19 10/26/19  Rudene Re, MD  apixaban (  ELIQUIS) 5 MG TABS tablet Take 1 tablet (5 mg total) by mouth 2 (two) times daily. 07/25/19   Kayleen Memos, DO  aspirin 81 MG chewable tablet Chew 1 tablet (81 mg total) by mouth daily. 07/25/19   Kayleen Memos, DO  Cholecalciferol (VITAMIN D3) 50 MCG (2000 UT) capsule Take by mouth. 11/12/18   [provider]  colchicine 0.6 MG tablet Take by mouth. 10/06/18   [provider]   diltiazem (CARDIZEM) 30 MG tablet Take 30 mg by mouth 2 (two) times daily.    [provider]  doxycycline (VIBRAMYCIN) 100 MG capsule Take 1 capsule (100 mg total) by mouth 2 (two) times daily for 10 days. 10/16/19 10/26/19  Rudene Re, MD  hydrALAZINE (APRESOLINE) 10 MG tablet Take 1 tablet (10 mg total) by mouth every 8 (eight) hours. 07/25/19 08/24/19  Kayleen Memos, DO  meclizine (ANTIVERT) 12.5 MG tablet Take 1 tablet (12.5 mg total) by mouth 2 (two) times daily as needed for dizziness. 07/25/19   Kayleen Memos, DO  Naphazoline-Pheniramine (ALLERGY EYE OP) Place 1 drop into both eyes daily as needed (for dry eyes).    [provider]  nitroGLYCERIN (NITROSTAT) 0.4 MG SL tablet Place 0.4 mg under the tongue every 5 (five) minutes as needed for chest pain.  12/31/17   [provider]  nystatin (MYCOSTATIN/NYSTOP) powder Apply 1 g topically daily as needed. areas or skinfolds for yeast 01/30/18   [provider]  pantoprazole (PROTONIX) 40 MG tablet Take 40 mg by mouth daily.  12/31/17   [provider]  simvastatin (ZOCOR) 10 MG tablet Take 10 mg by mouth daily. HLD- reduced d/t interaction with Diltiazem- increased r/f Rhabdomyelosis    [provider]    Allergies Benzocaine, Lisinopril, Neomycin, Sulfamethoxazole-trimethoprim, and Sulfa antibiotics  Family History  Problem Relation Age of Onset   Angina Mother    Hypertension Mother    Lung cancer Mother    Hypertension Father    Heart attack Father    Nephrolithiasis Sister    Hypertension Other        sibling    Social History Social History   Tobacco Use   Smoking status: Never Smoker   Smokeless tobacco: Never Used  Substance Use Topics   Alcohol use: No   Drug use: Never    Review of Systems  Constitutional: Negative for fever. + confusion Eyes: Negative for visual changes. ENT: Negative for sore throat. Neck: No neck pain  Cardiovascular:  Negative for chest pain. Respiratory: Negative for shortness of breath. + cough Gastrointestinal: Negative for abdominal pain, vomiting or diarrhea. Genitourinary: Negative for dysuria. Musculoskeletal: Negative for back pain. Skin: Negative for rash. Neurological: Negative for headaches, weakness or numbness. Psych: No SI or HI  ____________________________________________   PHYSICAL EXAM:  VITAL SIGNS: ED Triage Vitals  Enc Vitals Group     BP 10/15/19 2256 (!) 145/58     Pulse Rate 10/15/19 2256 71     Resp 10/15/19 2256 16     Temp 10/15/19 2256 99.3 F (37.4 C)     Temp Source 10/15/19 2256 Oral     SpO2 10/15/19 2256 95 %     Weight 10/15/19 2254 143 lb (64.9 kg)     Height 10/15/19 2254 5\' 1"  (1.549 m)     Head Circumference --      Peak Flow --      Pain Score 10/15/19 2253 0     Pain  Loc --      Pain Edu? --      Excl. in [place]? --     Constitutional: Alert and oriented x 3. Well appearing and in no apparent distress. HEENT:      Head: Normocephalic and atraumatic.         Eyes: Conjunctivae are normal. Sclera is non-icteric.       Mouth/Throat: Mucous membranes are moist.       Neck: Supple with no signs of meningismus. Cardiovascular: Regular rate and rhythm. No murmurs, gallops, or rubs. 2+ symmetrical distal pulses are present in all extremities. No JVD. Respiratory: Normal respiratory effort. Lungs are clear to auscultation bilaterally. No wheezes, crackles, or rhonchi.  Gastrointestinal: Soft, non tender, and non distended with positive bowel sounds. No rebound or guarding. Musculoskeletal: Nontender with normal range of motion in all extremities. No edema, cyanosis, or erythema of extremities. Neurologic: Normal speech and language. Face is symmetric. Moving all extremities. No gross focal neurologic deficits are appreciated. Skin: Skin is warm, dry and intact. No rash noted. Psychiatric: Mood and affect are normal. Speech and behavior are  normal.  ____________________________________________   LABS (all labs ordered are listed, but only abnormal results are displayed)  Labs Reviewed  CBC WITH DIFFERENTIAL/PLATELET - Abnormal; Notable for the following components:      Result Value   RBC 3.58 (*)    Hemoglobin 10.7 (*)    HCT 32.4 (*)    Platelets 149 (*)    All other components within normal limits  COMPREHENSIVE METABOLIC PANEL - Abnormal; Notable for the following components:   CO2 21 (*)    Glucose, Bld 133 (*)    GFR calc non Af Amer 55 (*)    All other components within normal limits  URINALYSIS, COMPLETE (UACMP) WITH MICROSCOPIC - Abnormal; Notable for the following components:   Color, Urine YELLOW (*)    APPearance HAZY (*)    Protein, ur 30 (*)    Leukocytes,Ua MODERATE (*)    Bacteria, UA RARE (*)    All other components within normal limits  URINE CULTURE  LACTIC ACID, PLASMA   ____________________________________________  EKG  Normal sinus rhythm, rate of 74, first-degree AV block, normal QTC, left axis deviation, no ST elevations or depressions, anterior and inferior Q waves.  Unchanged from prior ____________________________________________  RADIOLOGY  I have personally reviewed the images performed during this visit and I agree with the Radiologist's read.   Interpretation by Radiologist:  DG Chest Portable 1 View  Result Date: 10/15/2019 CLINICAL DATA:  Shortness of breath, presents from home after daughter stay she was not acting like herself. EXAM: PORTABLE CHEST 1 VIEW COMPARISON:  Radiograph 07/16/2019, CTA 01/21/2016 FINDINGS: Chronically coarsened interstitial changes and bronchiectasis as well as stable cardiomegaly and central pulmonary arterial enlargement with a calcified, tortuous aorta. Some streaky opacities in the lung bases may reflect areas of atelectasis. Coarse calcification is seen in the left breast tissue, present on comparison CT 01/21/2016. No new consolidative opacity  is present. No pneumothorax or visible effusion. Partially healed posttraumatic deformity of the left humeral surgical neck. Additional degenerative changes in the imaged spine and shoulders. Soft tissues are unremarkable. IMPRESSION: 1. Streaky basilar opacities favored to reflect atelectasis though early infection could have a similar appearance. No focally consolidative opacity is seen however. 2. Chronically coarsened interstitial changes and bronchiectasis. 3. Stable cardiomegaly and central pulmonary arterial enlargement. Findings suggest some pulmonary artery hypertension. 4. Redemonstrated left breast calcification. Correlation with  prior mammographic workup is recommended. Electronically Signed   By: Lovena Le M.D.   On: 10/15/2019 23:58     ____________________________________________   PROCEDURES  Procedure(s) performed: None Procedures Critical Care performed:  None ____________________________________________   INITIAL IMPRESSION / ASSESSMENT AND PLAN / ED COURSE  79 y.o. female with a history of CAD, hypertension, hyperlipidemia, Parkinson's, dementia, asthma, diabetes, subdural hematoma A. fib on Eliquis, V tach, CKD who was brought in for mild confusion after being diagnosed with a UTI yesterday.  She has been started on antibiotics.  Patient is a GCS of 15 and alert and oriented x3, neurologically intact.  Vitals are within normal limits.  She is complaining mild cough but her lungs sound clear on auscultation, she has normal work of breathing and normal sats.  We will check labs, EKG, UA, CXR to rule out sepsis, UTI, pyelonephritis, dehydration, DKA, anemia, electrolyte derangements, ischemia, pneumonia.     _________________________ 1:19 AM on 10/16/2019 ----------------------------------------- UA is consistent with a UTI with no signs of sepsis.  Chest x-ray was done because patient was complaining of a mild cough.  Is read as atelectasis versus probably early  pneumonia.  Patient has no respiratory distress, no fever otherwise.  However since she is already on antibiotics for UTI I will broaden for possible community-acquired pneumonia.  We will give her a dose of Rocephin and plan on discharge home on Augmentin and doxycycline.  I have attempted to contact patient's daughter unsuccessfully.  Patient lives with her daughter.  At this time it is unclear which antibiotics she is taking.  We will keep trying to reach the daughter.  _________________________ 6:22 AM on 10/16/2019 -----------------------------------------  I was finally able to speak with patient's daughter.  She has not noticed any cough on the patient.  The patient does live with her.  According to the daughter, patient was started on Macrobid for her UTI.  Therefore with a chest x-ray concerning for possible early pneumonia I told her daughter to stop the Macrobid and we will switch her prescription to Augmentin which should take care of both community-acquired pneumonia and a urinary tract infection.  We'll also add doxycycline for atypical infection.  Patient remains with no respiratory distress, satting 100% on room air.  I have reevaluated her several times and have not noticed any coughing.  I discussed close follow-up with PCP and my standard return precautions.   As part of my medical decision making, I reviewed the following data within the [place] History obtained from family, Nursing notes reviewed and incorporated, Labs reviewed , EKG interpreted , Old EKG reviewed, Old chart reviewed, Radiograph reviewed , Notes from prior ED visits and [place] Controlled Substance Database   Please note:  Patient was evaluated in Emergency Department today for the symptoms described in the history of present illness. Patient was evaluated in the context of the global COVID-19 pandemic, which necessitated consideration that the patient might be at risk for infection with the SARS-CoV-2  virus that causes COVID-19. Institutional protocols and algorithms that pertain to the evaluation of patients at risk for COVID-19 are in a state of rapid change based on information released by regulatory bodies including the CDC and federal and state organizations. These policies and algorithms were followed during the patient's care in the ED.  Some ED evaluations and interventions may be delayed as a result of limited staffing during the pandemic.   ____________________________________________   FINAL CLINICAL IMPRESSION(S) / ED DIAGNOSES  Final diagnoses:  Acute cystitis with hematuria  Community acquired pneumonia, unspecified laterality      NEW MEDICATIONS STARTED DURING THIS VISIT:  ED Discharge Orders         Ordered    amoxicillin-clavulanate (AUGMENTIN) 875-125 MG tablet  2 times daily     10/16/19 0620    doxycycline (VIBRAMYCIN) 100 MG capsule  2 times daily     10/16/19 0620           Note:  This document was prepared using Dragon voice recognition software and may include unintentional dictation errors.    Alfred Levins, [place], MD 10/16/19 [phone]

## 2019-10-16 LAB — URINALYSIS, COMPLETE (UACMP) WITH MICROSCOPIC
Bilirubin Urine: NEGATIVE
Glucose, UA: NEGATIVE mg/dL
Hgb urine dipstick: NEGATIVE
Ketones, ur: NEGATIVE mg/dL
Nitrite: NEGATIVE
Protein, ur: 30 mg/dL — AB
Specific Gravity, Urine: 1.021 (ref 1.005–1.030)
pH: 6 (ref 5.0–8.0)

## 2019-10-16 LAB — CBC WITH DIFFERENTIAL/PLATELET
Abs Immature Granulocytes: 0.01 10*3/uL (ref 0.00–0.07)
Basophils Absolute: 0 10*3/uL (ref 0.0–0.1)
Basophils Relative: 0 %
Eosinophils Absolute: 0 10*3/uL (ref 0.0–0.5)
Eosinophils Relative: 0 %
HCT: 32.4 % — ABNORMAL LOW (ref 36.0–46.0)
Hemoglobin: 10.7 g/dL — ABNORMAL LOW (ref 12.0–15.0)
Immature Granulocytes: 0 %
Lymphocytes Relative: 16 %
Lymphs Abs: 0.7 10*3/uL (ref 0.7–4.0)
MCH: 29.9 pg (ref 26.0–34.0)
MCHC: 33 g/dL (ref 30.0–36.0)
MCV: 90.5 fL (ref 80.0–100.0)
Monocytes Absolute: 0.2 10*3/uL (ref 0.1–1.0)
Monocytes Relative: 5 %
Neutro Abs: 3.6 10*3/uL (ref 1.7–7.7)
Neutrophils Relative %: 79 %
Platelets: 149 10*3/uL — ABNORMAL LOW (ref 150–400)
RBC: 3.58 MIL/uL — ABNORMAL LOW (ref 3.87–5.11)
RDW: 14.5 % (ref 11.5–15.5)
WBC: 4.5 10*3/uL (ref 4.0–10.5)
nRBC: 0 % (ref 0.0–0.2)

## 2019-10-16 LAB — COMPREHENSIVE METABOLIC PANEL
ALT: 6 U/L (ref 0–44)
AST: 21 U/L (ref 15–41)
Albumin: 3.5 g/dL (ref 3.5–5.0)
Alkaline Phosphatase: 112 U/L (ref 38–126)
Anion gap: 12 (ref 5–15)
BUN: 20 mg/dL (ref 8–23)
CO2: 21 mmol/L — ABNORMAL LOW (ref 22–32)
Calcium: 9.2 mg/dL (ref 8.9–10.3)
Chloride: 106 mmol/L (ref 98–111)
Creatinine, Ser: 0.97 mg/dL (ref 0.44–1.00)
GFR calc Af Amer: >60 mL/min (ref >60)
GFR calc non Af Amer: 55 mL/min — ABNORMAL LOW (ref >60)
Glucose, Bld: 133 mg/dL — ABNORMAL HIGH (ref 70–99)
Potassium: 3.6 mmol/L (ref 3.5–5.1)
Sodium: 139 mmol/L (ref 135–145)
Total Bilirubin: 0.5 mg/dL (ref 0.3–1.2)
Total Protein: 6.9 g/dL (ref 6.5–8.1)

## 2019-10-16 LAB — LACTIC ACID, PLASMA: Lactic Acid, Venous: 0.9 mmol/L (ref 0.5–1.9)

## 2019-10-16 MED ORDER — AMOXICILLIN-POT CLAVULANATE 875-125 MG PO TABS: 1.0000 | ORAL_TABLET | Freq: Two times a day (BID) | ORAL | 0 refills | Status: AC

## 2019-10-16 MED ORDER — SODIUM CHLORIDE 0.9 % IV SOLN
1.0000 g | Freq: Once | INTRAVENOUS | Status: AC
  Administered 2019-10-16: 02:00:00 1 g via INTRAVENOUS
  Filled 2019-10-16: qty 10

## 2019-10-16 MED ORDER — DOXYCYCLINE HYCLATE 100 MG PO CAPS: 100.0000 mg | ORAL_CAPSULE | Freq: Two times a day (BID) | ORAL | 0 refills | Status: AC

## 2019-10-16 NOTE — ED Notes (Signed)
This nurse assisted pt with using the bedpan. Pt states that she is too unsteady on her feet to attempt to walk to bathroom. This nurse changed pt brief.

## 2019-10-16 NOTE — ED Notes (Signed)
Pt pulse rate is not 25. Pt pulse rate is 63-65.

## 2019-10-16 NOTE — ED Notes (Signed)
Pt cleaned up   New brief applied

## 2019-10-16 NOTE — ED Notes (Signed)
Received pt from night shift   Resting at present  Waiting for family to come and get her   Pt is aware

## 2019-10-17 LAB — URINE CULTURE

## 2020-07-28 IMAGING — CT CT HEAD W/O CM
4 series · 17 of 47 positions shown, 19 images · non-contrast
Comparison: 07/17/2019. 07/16/2019.

CLINICAL DATA: Followup subdural hematoma.

EXAM:
CT HEAD WITHOUT CONTRAST
TECHNIQUE: Contiguous axial images were obtained from the base of the skull
through the vertex without intravenous contrast.

[Series 3: head wo · axial · 0.41mm/px · z∈[-150,-30]mm · 7 of 33 slices shown, 9 images]
[im 5/33  brain]
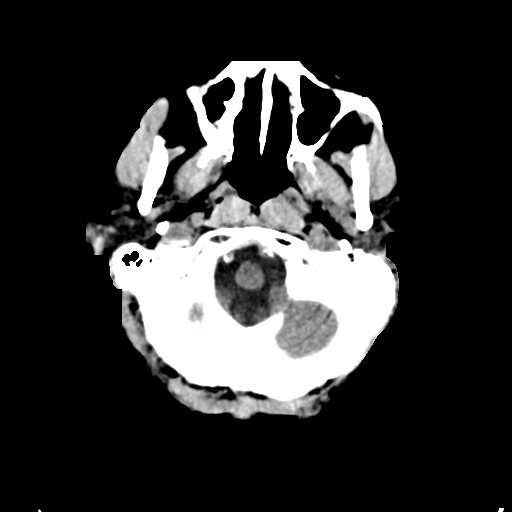
[im 5/33  bone]
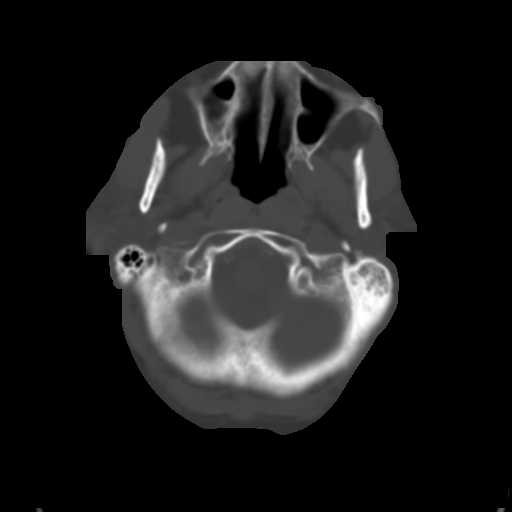
[im 9/33  brain]
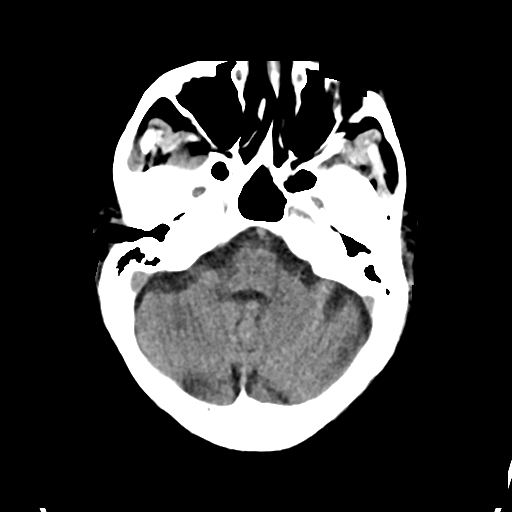
[im 13/33  brain]
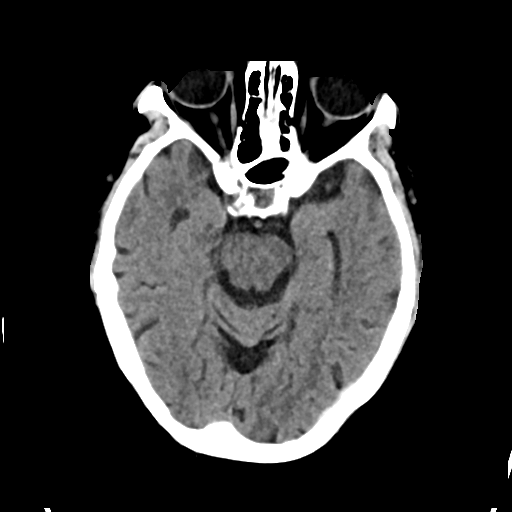
[im 17/33  brain]
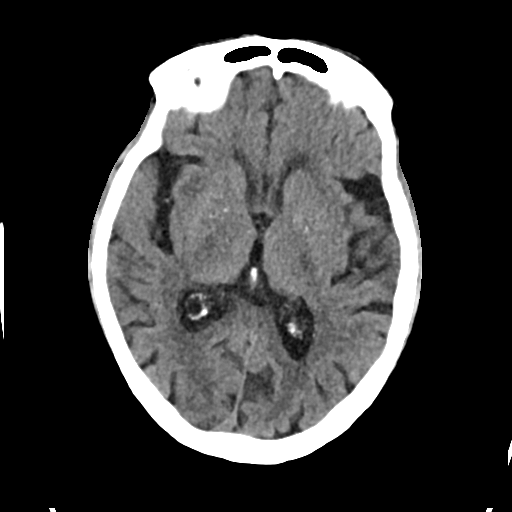
[im 21/33  brain]
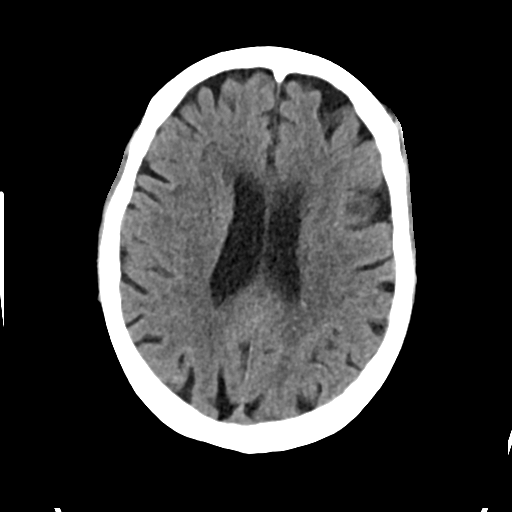
[im 21/33  bone]
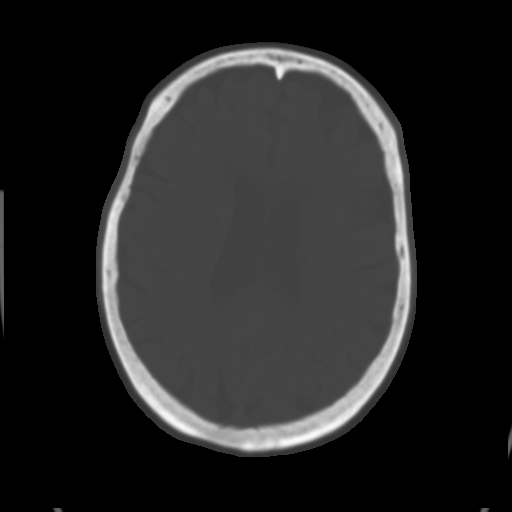
[im 25/33  brain]
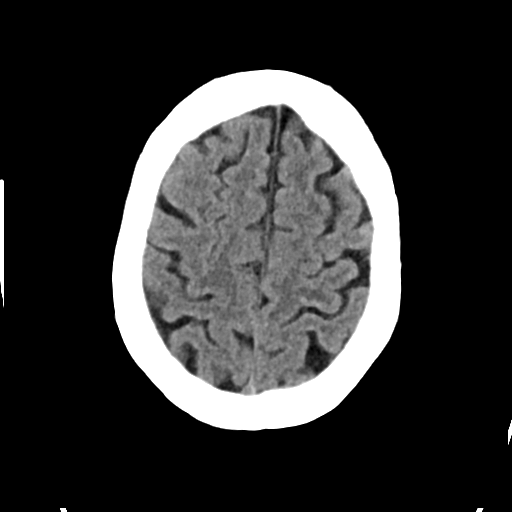
[im 29/33  brain]
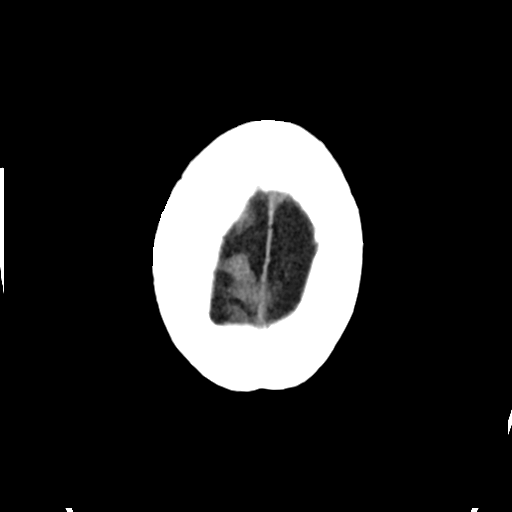

[Series 4: head bone · axial · 0.41mm/px · z∈[-154,-98]mm · 4 of 82 slices shown]
[im 9/82  bone]
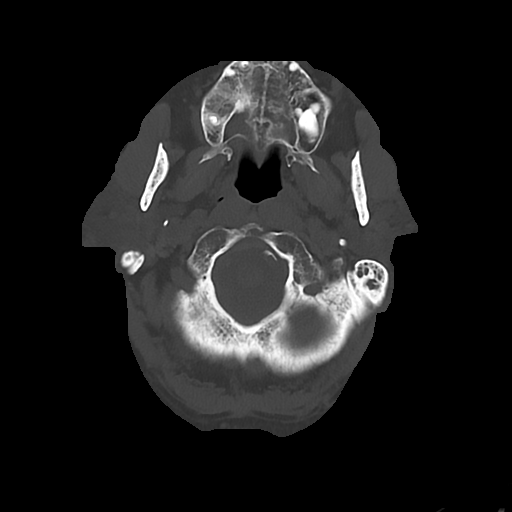
[im 17/82  bone]
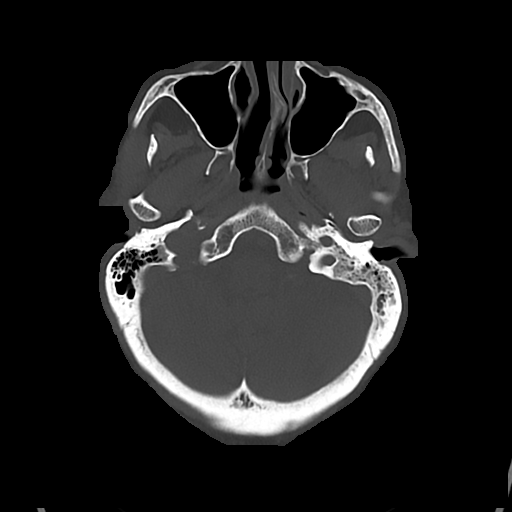
[im 25/82  bone]
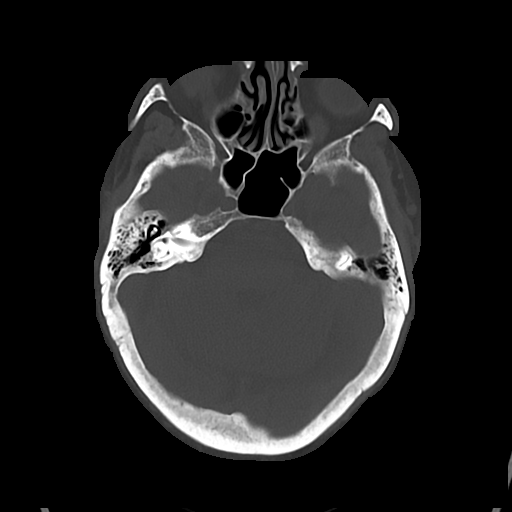
[im 37/82  bone]
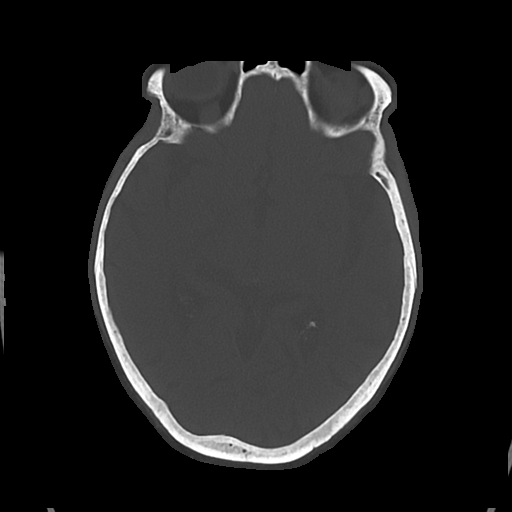

[Series 5: cor soft · coronal · 0.34mm/px · 3 of 66 slices shown]
[im 22/66  brain]
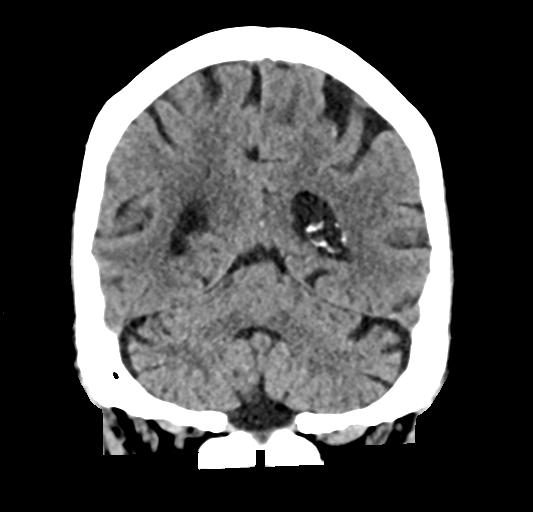
[im 29/66  brain]
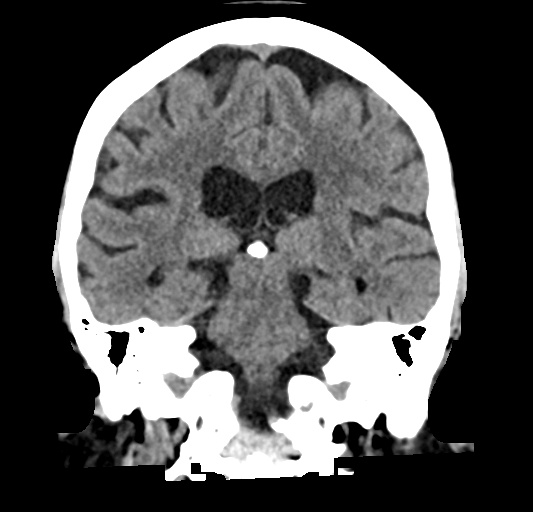
[im 37/66  brain]
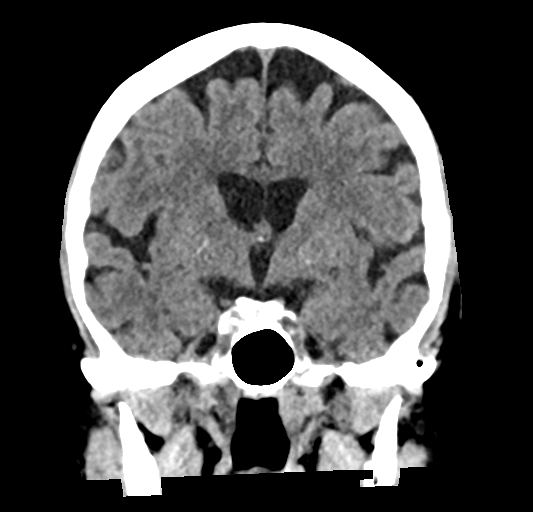

[Series 6: sag soft · sagittal · 0.34mm/px · 3 of 60 slices shown]
[im 20/60  brain]
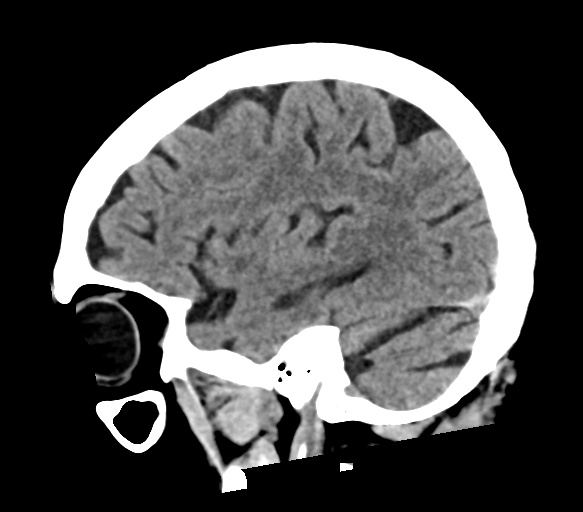
[im 30/60  brain]
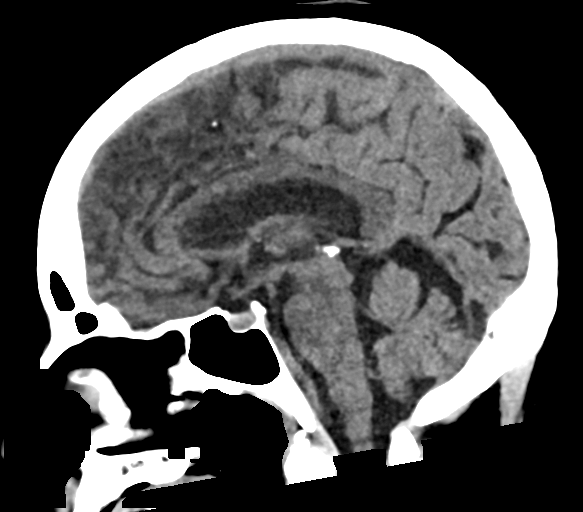
[im 40/60  brain]
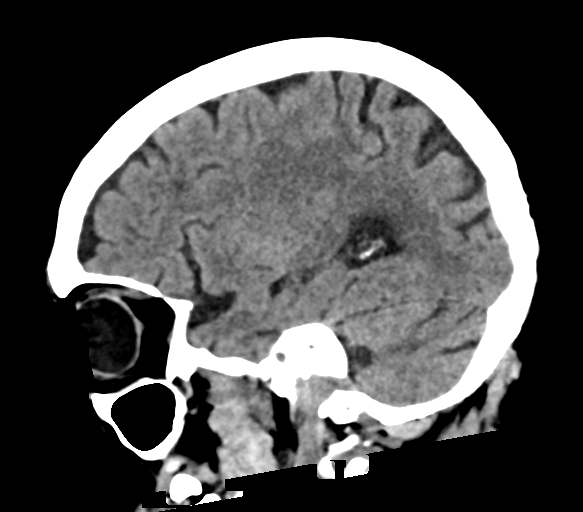

[17 of 47 positions shown; findings below may reference images not displayed]

FINDINGS: Brain: Complete resolution of previously seen minor left-sided
subdural hematoma. No evidence of intracranial hemorrhage at the
present time. The brain shows atrophy with chronic small-vessel
ischemic changes of the white matter. No mass, hydrocephalus or
extra-axial collection.

Vascular: There is atherosclerotic calcification of the major
vessels at the base of the brain.

Skull: No skull fracture.

Sinuses/Orbits: Clear/normal

Other: Left frontal scalp hematoma is getting smaller.
IMPRESSION: 1. Complete resolution of previously seen minor left-sided subdural
hematoma. No evidence of intracranial hemorrhage at the present
time.
2. Atrophy and chronic small-vessel ischemic changes of the white
matter.
3. Left frontal scalp hematoma is getting smaller.

## 2020-09-28 ENCOUNTER — Encounter: Payer: Self-pay | Admitting: Emergency Medicine

## 2020-09-28 ENCOUNTER — Other Ambulatory Visit: Payer: Self-pay

## 2020-09-28 ENCOUNTER — Inpatient Hospital Stay
Admission: EM | Admit: 2020-09-28 | Discharge: 2020-10-06 | DRG: 229 | Disposition: A | Discharge status: Skilled Nursing Facility | Payer: Medicare Other | Attending: Internal Medicine | Admitting: Internal Medicine

## 2020-09-28 ENCOUNTER — Emergency Department: Payer: Medicare Other

## 2020-09-28 DIAGNOSIS — E782 Mixed hyperlipidemia: Secondary | ICD-10-CM | POA: Diagnosis present

## 2020-09-28 DIAGNOSIS — N390 Urinary tract infection, site not specified: Secondary | ICD-10-CM

## 2020-09-28 DIAGNOSIS — Z79899 Other long term (current) drug therapy: Secondary | ICD-10-CM

## 2020-09-28 DIAGNOSIS — Z7901 Long term (current) use of anticoagulants: Secondary | ICD-10-CM

## 2020-09-28 DIAGNOSIS — R55 Syncope and collapse: Secondary | ICD-10-CM

## 2020-09-28 DIAGNOSIS — I2510 Atherosclerotic heart disease of native coronary artery without angina pectoris: Secondary | ICD-10-CM | POA: Diagnosis present

## 2020-09-28 DIAGNOSIS — I480 Paroxysmal atrial fibrillation: Secondary | ICD-10-CM | POA: Diagnosis present

## 2020-09-28 DIAGNOSIS — Z9181 History of falling: Secondary | ICD-10-CM

## 2020-09-28 DIAGNOSIS — I495 Sick sinus syndrome: Principal | ICD-10-CM | POA: Diagnosis present

## 2020-09-28 DIAGNOSIS — I248 Other forms of acute ischemic heart disease: Secondary | ICD-10-CM | POA: Diagnosis present

## 2020-09-28 DIAGNOSIS — Z90710 Acquired absence of both cervix and uterus: Secondary | ICD-10-CM

## 2020-09-28 DIAGNOSIS — G309 Alzheimer's disease, unspecified: Secondary | ICD-10-CM | POA: Diagnosis present

## 2020-09-28 DIAGNOSIS — I5032 Chronic diastolic (congestive) heart failure: Secondary | ICD-10-CM | POA: Diagnosis present

## 2020-09-28 DIAGNOSIS — I472 Ventricular tachycardia: Secondary | ICD-10-CM | POA: Diagnosis not present

## 2020-09-28 DIAGNOSIS — Z20822 Contact with and (suspected) exposure to covid-19: Secondary | ICD-10-CM | POA: Diagnosis present

## 2020-09-28 DIAGNOSIS — Z86718 Personal history of other venous thrombosis and embolism: Secondary | ICD-10-CM

## 2020-09-28 DIAGNOSIS — I5033 Acute on chronic diastolic (congestive) heart failure: Secondary | ICD-10-CM | POA: Diagnosis present

## 2020-09-28 DIAGNOSIS — E119 Type 2 diabetes mellitus without complications: Secondary | ICD-10-CM | POA: Diagnosis present

## 2020-09-28 DIAGNOSIS — E876 Hypokalemia: Secondary | ICD-10-CM | POA: Diagnosis present

## 2020-09-28 DIAGNOSIS — F0280 Dementia in other diseases classified elsewhere without behavioral disturbance: Secondary | ICD-10-CM | POA: Diagnosis present

## 2020-09-28 DIAGNOSIS — I252 Old myocardial infarction: Secondary | ICD-10-CM

## 2020-09-28 DIAGNOSIS — Z955 Presence of coronary angioplasty implant and graft: Secondary | ICD-10-CM

## 2020-09-28 DIAGNOSIS — Z853 Personal history of malignant neoplasm of breast: Secondary | ICD-10-CM

## 2020-09-28 DIAGNOSIS — G20 Parkinson's disease: Secondary | ICD-10-CM | POA: Diagnosis present

## 2020-09-28 DIAGNOSIS — I10 Essential (primary) hypertension: Secondary | ICD-10-CM | POA: Diagnosis present

## 2020-09-28 DIAGNOSIS — Z7982 Long term (current) use of aspirin: Secondary | ICD-10-CM

## 2020-09-28 DIAGNOSIS — I110 Hypertensive heart disease with heart failure: Secondary | ICD-10-CM | POA: Diagnosis present

## 2020-09-28 LAB — CBC
HCT: 32.3 % — ABNORMAL LOW (ref 36.0–46.0)
Hemoglobin: 10.7 g/dL — ABNORMAL LOW (ref 12.0–15.0)
MCH: 30.1 pg (ref 26.0–34.0)
MCHC: 33.1 g/dL (ref 30.0–36.0)
MCV: 91 fL (ref 80.0–100.0)
Platelets: 253 10*3/uL (ref 150–400)
RBC: 3.55 MIL/uL — ABNORMAL LOW (ref 3.87–5.11)
RDW: 13.2 % (ref 11.5–15.5)
WBC: 6.1 10*3/uL (ref 4.0–10.5)
nRBC: 0 % (ref 0.0–0.2)

## 2020-09-28 LAB — URINALYSIS, COMPLETE (UACMP) WITH MICROSCOPIC
Bilirubin Urine: NEGATIVE
Glucose, UA: NEGATIVE mg/dL
Ketones, ur: 5 mg/dL — AB
Nitrite: NEGATIVE
Protein, ur: 100 mg/dL — AB
Specific Gravity, Urine: 1.015 (ref 1.005–1.030)
WBC, UA: >50 WBC/hpf — ABNORMAL HIGH (ref 0–5)
pH: 6 (ref 5.0–8.0)

## 2020-09-28 LAB — BASIC METABOLIC PANEL
Anion gap: 13 (ref 5–15)
BUN: 17 mg/dL (ref 8–23)
CO2: 26 mmol/L (ref 22–32)
Calcium: 9.7 mg/dL (ref 8.9–10.3)
Chloride: 96 mmol/L — ABNORMAL LOW (ref 98–111)
Creatinine, Ser: 0.94 mg/dL (ref 0.44–1.00)
GFR, Estimated: >60 mL/min (ref >60)
Glucose, Bld: 103 mg/dL — ABNORMAL HIGH (ref 70–99)
Potassium: 2.8 mmol/L — ABNORMAL LOW (ref 3.5–5.1)
Sodium: 135 mmol/L (ref 135–145)

## 2020-09-28 LAB — TROPONIN I (HIGH SENSITIVITY): Troponin I (High Sensitivity): 24 ng/L — ABNORMAL HIGH (ref <18)

## 2020-09-28 LAB — CK: Total CK: 29 U/L — ABNORMAL LOW (ref 38–234)

## 2020-09-28 MED ORDER — IOHEXOL 300 MG/ML  SOLN: 100.0000 mL | Freq: Once | INTRAMUSCULAR | Status: DC | PRN

## 2020-09-28 MED ORDER — IOHEXOL 300 MG/ML  SOLN
100.0000 mL | Freq: Once | INTRAMUSCULAR | Status: AC | PRN
  Administered 2020-09-29: 01:00:00 100 mL via INTRAVENOUS

## 2020-09-28 NOTE — ED Notes (Signed)
Pt states that she was at home today when she had a fall due to weakness. Pt states she hasn't been eating as much over the few weeks and that when she stood up she got weak and had a syncope episode. Another episode was witnessed by daughter today after she showed up. Pt denies hitting head when she fell.

## 2020-09-28 NOTE — ED Triage Notes (Signed)
Pt in via EMS from home with c/o falling and syncopal episode in the bathroom. 140/90, HR 86

## 2020-09-28 NOTE — ED Provider Notes (Signed)
[facility] Emergency Department Provider Note   ____________________________________________   Event Date/Time   First MD Initiated Contact with Patient 09/28/20 2301     (approximate)  I have reviewed the triage vital signs and the nursing notes.   HISTORY  Chief Complaint Fall and Dizziness    HPI Melinda Maxwell is a 79 y.o. female new lives at home.  Her daughter got Covid about 3 weeks ago and the patient went to bed and really has not been out of bed since then.  She has been complaining of dizziness which was lightheadedness and spinning for several weeks and has seen Dr. Melrose Nakayama the neurologist for this.  Patient also has a history of Alzheimer's and Parkinson's.  Patient comes in today because she was coming out of the bathroom and her eyes rolled back in her head and she fell down.  Her daughter got there a little bit later try to get the patient up and she passed out again.  Patient now feels good.  She complains of some pain in the right lower abdomen and spine.  This pain is at least been made worse by the fall.        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   Parkinson's disease [facility])    Sleep apnea     Patient Active Problem List   Diagnosis Date Noted   Subdural hematoma ([place]) 07/16/2019   Closed displaced fracture of surgical neck of left humerus 07/16/2019   Non-sustained ventricular tachycardia ([place]) 07/16/2019   Chronic diastolic CHF (congestive heart failure) ([place]) 07/16/2019   CKD (chronic kidney disease), stage III ([place]) 07/16/2019   Syncope    Fall    CAD (coronary artery disease) 03/26/2018   Generalized weakness 03/26/2018    Paroxysmal atrial fibrillation ([place]) 02/27/2018   UTI (urinary tract infection) 02/18/2018   Stable angina ([place]) 01/15/2018   Hyperlipidemia, mixed 09/10/2014   Moderate tricuspid insufficiency 09/10/2014   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   Diabetes mellitus with stage 3 chronic kidney disease ([place]) 07/09/2014   Benign essential hypertension 02/21/2014   Gout 02/21/2014   OSA (obstructive sleep apnea) 02/21/2014    Past Surgical History:  Procedure Laterality Date   ABDOMINAL HYSTERECTOMY     APPENDECTOMY     BREAST LUMPECTOMY     L breast   CATARACT EXTRACTION     COLONOSCOPY     08/27/1989, 01/03/1998, 06/09/2004   CORONARY STENT INTERVENTION N/A 02/11/2018   Procedure: CORONARY STENT INTERVENTION;  Surgeon: Nelva Bush, MD;  Location: [place] CV LAB;  Service: Cardiovascular;  Laterality: N/A;   ESOPHAGOGASTRODUODENOSCOPY  06/09/2004   FLEXIBLE SIGMOIDOSCOPY     HEMICOLECTOMY     LEFT HEART CATH AND CORONARY ANGIOGRAPHY Left 02/11/2018   Procedure: LEFT HEART CATH AND CORONARY ANGIOGRAPHY;  Surgeon: Corey Skains, MD;  Location: [place] CV [facility];  Service: Cardiovascular;  Laterality: Left;   ORIF ANKLE FRACTURE  2004    Prior to Admission medications   Medication Sig Start Date End Date Taking? Authorizing Provider  allopurinol (ZYLOPRIM) 300 MG tablet Take  300 mg by mouth daily.    [provider]  apixaban (ELIQUIS) 5 MG TABS tablet Take 1 tablet (5 mg total) by mouth 2 (two) times daily. 07/25/19   Darlin Drop, DO  aspirin 81 MG chewable tablet Chew 1 tablet (81 mg total) by mouth daily. 07/25/19   Darlin Drop, DO  Cholecalciferol (VITAMIN D3) 50 MCG (2000 UT) capsule Take by mouth. 11/12/18   [provider]  colchicine 0.6 MG tablet Take by mouth. 10/06/18   [provider]  diltiazem (CARDIZEM) 30 MG tablet Take 30 mg by mouth 2 (two) times daily.    [provider]   hydrALAZINE (APRESOLINE) 10 MG tablet Take 1 tablet (10 mg total) by mouth every 8 (eight) hours. 07/25/19 08/24/19  Darlin Drop, DO  meclizine (ANTIVERT) 12.5 MG tablet Take 1 tablet (12.5 mg total) by mouth 2 (two) times daily as needed for dizziness. 07/25/19   Darlin Drop, DO  Naphazoline-Pheniramine (ALLERGY EYE OP) Place 1 drop into both eyes daily as needed (for dry eyes).    [provider]  nitroGLYCERIN (NITROSTAT) 0.4 MG SL tablet Place 0.4 mg under the tongue every 5 (five) minutes as needed for chest pain.  12/31/17   [provider]  nystatin (MYCOSTATIN/NYSTOP) powder Apply 1 g topically daily as needed. areas or skinfolds for yeast 01/30/18   [provider]  pantoprazole (PROTONIX) 40 MG tablet Take 40 mg by mouth daily.  12/31/17   [provider]  simvastatin (ZOCOR) 10 MG tablet Take 10 mg by mouth daily. HLD- reduced d/t interaction with Diltiazem- increased r/f Rhabdomyelosis    [provider]    Allergies Benzocaine, Lisinopril, Neomycin, Sulfamethoxazole-trimethoprim, and Sulfa antibiotics  Family History  Problem Relation Age of Onset   Angina Mother    Hypertension Mother    Lung cancer Mother    Hypertension Father    Heart attack Father    Nephrolithiasis Sister    Hypertension Other        sibling    Social History Social History   Tobacco Use   Smoking status: Never Smoker   Smokeless tobacco: Never Used  Building services engineer Use: Never used  Substance Use Topics   Alcohol use: No   Drug use: Never    Review of Systems  Constitutional: No fever/chills Eyes: No visual changes. ENT: No sore throat. Cardiovascular: Denies chest pain. Respiratory: Denies shortness of breath. Gastrointestinal: No abdominal pain.  No nausea, no vomiting.  No diarrhea.  No constipation. Genitourinary: Negative for dysuria. Musculoskeletal: Negative for back pain. Skin: Negative for  rash.   ____________________________________________   PHYSICAL EXAM:  VITAL SIGNS: ED Triage Vitals [09/28/20 1215]  Enc Vitals Group     BP (!) 172/68     Pulse Rate 68     Resp 17     Temp 97.9 F (36.6 C)     Temp Source Oral     SpO2 98 %     Weight 130 lb (59 kg)     Height 5\' 1"  (1.549 m)     Head Circumference      Peak Flow      Pain Score 7     Pain Loc      Pain Edu?      Excl. in GC?     Constitutional: Alert and oriented. Well appearing and in no acute distress. Eyes: Conjunctivae are normal. PER. EOMI. Head: Atraumatic. Nose: No  congestion/rhinnorhea. Mouth/Throat: Mucous membranes are moist.  Oropharynx non-erythematous. Neck: No stridor. Cardiovascular: Normal rate, regular rhythm. Grossly normal heart sounds.  Good peripheral circulation. Respiratory: Normal respiratory effort.  No retractions. Lungs CTAB. Gastrointestinal: Soft and nontender except in the right lower quadrant to palpation. No distention. No abdominal bruits.  Slight right CVA tenderness. Musculoskeletal: No lower extremity tenderness nor edema.   Neurologic:  Normal speech and language. No gross focal neurologic deficits are appreciated.  Cranial nerves II through XII are intact of the visual fields were not checked cerebellar finger-nose is normal motor strength is 5/5 throughout patient does not report any numbness Skin:  Skin is warm, dry and intact. No rash noted.  ____________________________________________   LABS (all labs ordered are listed, but only abnormal results are displayed)  Labs Reviewed  BASIC METABOLIC PANEL - Abnormal; Notable for the following components:      Result Value   Potassium 2.8 (*)    Chloride 96 (*)    Glucose, Bld 103 (*)    All other components within normal limits  CBC - Abnormal; Notable for the following components:   RBC 3.55 (*)    Hemoglobin 10.7 (*)    HCT 32.3 (*)    All other components within normal limits  URINALYSIS, COMPLETE  (UACMP) WITH MICROSCOPIC - Abnormal; Notable for the following components:   Color, Urine YELLOW (*)    APPearance CLOUDY (*)    Hgb urine dipstick SMALL (*)    Ketones, ur 5 (*)    Protein, ur 100 (*)    Leukocytes,Ua LARGE (*)    WBC, UA >50 (*)    Bacteria, UA RARE (*)    All other components within normal limits  CK - Abnormal; Notable for the following components:   Total CK 29 (*)    All other components within normal limits  TROPONIN I (HIGH SENSITIVITY) - Abnormal; Notable for the following components:   Troponin I (High Sensitivity) 24 (*)    All other components within normal limits  TROPONIN I (HIGH SENSITIVITY) - Abnormal; Notable for the following components:   Troponin I (High Sensitivity) 24 (*)    All other components within normal limits  RESP PANEL BY RT-PCR (FLU A&B, COVID) [id number]   ____________________________________________  EKG  EKG read interpreted by me appears to show a flutter at a rate of 66 with a rightward axis.  There may be some slight ST segment depression in V5 and 6 is very difficult to tell because of the extremely irregular baseline on this EKG ____________________________________________  RADIOLOGY Gertha Calkin, personally viewed and evaluated these images (plain radiographs) as part of my medical decision making, as well as reviewing the written report by the radiologist.  ED MD interpretation: No x-rays of the thoracic and lumbar spine show degenerative joint disease and poliosis but no acute changes chest x-ray also some scarring but no acute changes I reviewed the films and agree with the radiologist reading.  Official radiology report(s): DG Chest 2 View  Result Date: 09/28/2020 CLINICAL DATA:  Dizziness. EXAM: CHEST - 2 VIEW COMPARISON:  October 15, 2019 FINDINGS: Chronic scarring is seen within the lateral aspect of the left lung base. There is no evidence of acute infiltrate, pleural effusion or pneumothorax. The heart size and  mediastinal contours are within normal limits. A chronic deformity is seen involving the proximal left humerus. Degenerative changes seen throughout the thoracic spine. IMPRESSION: Chronic left basilar scarring without acute or active  cardiopulmonary disease. Electronically Signed   By: Virgina Norfolk M.D.   On: 09/28/2020 23:52   DG Thoracic Spine 2 View  Result Date: 09/28/2020 CLINICAL DATA:  Status post fall. EXAM: THORACIC SPINE 2 VIEWS COMPARISON:  None. FINDINGS: There is no evidence of an acute thoracic spine fracture. There is mild levoscoliosis of the lower thoracic spine. Mild-to-moderate severity multilevel endplate sclerosis is seen with multilevel intervertebral disc space narrowing. No other significant bone abnormalities are identified. IMPRESSION: 1. Mild-to-moderate severity multilevel degenerative changes without an acute osseous abnormality. Electronically Signed   By: Virgina Norfolk M.D.   On: 09/28/2020 23:53   DG Lumbar Spine Complete  Result Date: 09/28/2020 CLINICAL DATA:  Status post fall. EXAM: LUMBAR SPINE - COMPLETE 4+ VIEW COMPARISON:  None. FINDINGS: There is no evidence of an acute lumbar spine fracture. There is marked severity dextroscoliosis of the lumbar spine. There is moderate to marked severity multilevel endplate sclerosis with moderate to marked severity multilevel intervertebral disc space narrowing. Multiple radiopaque surgical clips and surgical sutures are seen within the pelvis. IMPRESSION: 1. Marked severity dextroscoliosis of the lumbar spine with moderate to marked severity multilevel degenerative disc disease. Electronically Signed   By: Virgina Norfolk M.D.   On: 09/28/2020 23:54   CT Head Wo Contrast  Result Date: 09/29/2020 CLINICAL DATA:  Fall EXAM: CT HEAD WITHOUT CONTRAST CT CERVICAL SPINE WITHOUT CONTRAST TECHNIQUE: Multidetector CT imaging of the head and cervical spine was performed following the standard protocol without intravenous  contrast. Multiplanar CT image reconstructions of the cervical spine were also generated. COMPARISON:  CT head 07/22/2019, 07/17/2019, CT head and cervical spine 07/16/2019 FINDINGS: CT HEAD FINDINGS Brain: No evidence of acute infarction, hemorrhage, hydrocephalus, extra-axial collection, visible mass lesion or mass effect. Symmetric prominence of the ventricles, cisterns and sulci compatible with parenchymal volume loss. Patchy areas of white matter hypoattenuation are most compatible with chronic microvascular angiopathy. Vascular: Atherosclerotic calcification of the carotid siphons and intradural vertebral arteries. No hyperdense vessel. Skull: No calvarial fracture or suspicious osseous lesion. No scalp swelling or hematoma. Sinuses/Orbits: Pneumatized secretions in the right maxillary and sphenoid sinuses. Additional mild pansinus mural disease and small bilateral mastoid effusions. Orbital structures are unremarkable aside from prior lens extractions. Other: None. CT CERVICAL SPINE FINDINGS Alignment: Stabilization collar is absent at the time of examination. There is mild rightward lateral cervical flexion and slight rightward cranial rotation. Unchanged degenerative anterolisthesis C3-C5 retrolisthesis C5 on 6. No evidence of traumatic listhesis. No abnormally widened, perched or jumped facets. Normal alignment of the craniocervical articulations. Arthrosis and narrowing of the atlantodental interval but with otherwise normal alignment of the atlantoaxial articulations. Skull base and vertebrae: No acute skull base fracture. No vertebral body fracture or height loss. Normal bone mineralization. No worrisome osseous lesions. Moderate arthrosis at the atlantodental and basion dens interval with some callus formation. Appearance is similar to prior. Multilevel cervical spondylitic changes are described below. Soft tissues and spinal canal: No pre or paravertebral fluid or swelling. No visible canal hematoma.  Cervical carotid atherosclerosis at the level of the bifurcations. Airways are patent. Disc levels: Multilevel cervical spondylitic changes are present, maximal C5-C7. Slightly larger disc osteophyte complexes present C4-5 and C6-7 partially efface the ventral thecal sac with spurring, retrolisthesis and some ligamentum flavum infolding at C5-6 resulting in mild canal stenosis. Multilevel uncinate spurring facet hypertrophic changes are present resulting in mild-to-moderate multilevel neural foraminal narrowing with more moderate to severe narrowing bilaterally C5-6 as well. Upper chest: No  acute abnormality in the upper chest or imaged lung apices. Other: Normal thyroid. IMPRESSION: 1. No acute intracranial abnormality. No acute scalp swelling or calvarial fracture. 2. Stable parenchymal volume loss and chronic microvascular ischemic white matter disease. 3. Pneumatized secretions in the right maxillary and sphenoid sinuses. Additional mild pansinus mural disease and small bilateral mastoid effusions. Correlate for features of acute sinusitis. 4. No acute fracture or traumatic listhesis of the cervical spine. 5. Multilevel cervical spondylitic and facet degenerative changes, maximal C5-C7, as above. 6. Cervical and intracranial atherosclerosis. Electronically Signed   By: Lovena Le M.D.   On: 09/29/2020 00:20   CT Cervical Spine Wo Contrast  Result Date: 09/29/2020 CLINICAL DATA:  Fall EXAM: CT HEAD WITHOUT CONTRAST CT CERVICAL SPINE WITHOUT CONTRAST TECHNIQUE: Multidetector CT imaging of the head and cervical spine was performed following the standard protocol without intravenous contrast. Multiplanar CT image reconstructions of the cervical spine were also generated. COMPARISON:  CT head 07/22/2019, 07/17/2019, CT head and cervical spine 07/16/2019 FINDINGS: CT HEAD FINDINGS Brain: No evidence of acute infarction, hemorrhage, hydrocephalus, extra-axial collection, visible mass lesion or mass effect.  Symmetric prominence of the ventricles, cisterns and sulci compatible with parenchymal volume loss. Patchy areas of white matter hypoattenuation are most compatible with chronic microvascular angiopathy. Vascular: Atherosclerotic calcification of the carotid siphons and intradural vertebral arteries. No hyperdense vessel. Skull: No calvarial fracture or suspicious osseous lesion. No scalp swelling or hematoma. Sinuses/Orbits: Pneumatized secretions in the right maxillary and sphenoid sinuses. Additional mild pansinus mural disease and small bilateral mastoid effusions. Orbital structures are unremarkable aside from prior lens extractions. Other: None. CT CERVICAL SPINE FINDINGS Alignment: Stabilization collar is absent at the time of examination. There is mild rightward lateral cervical flexion and slight rightward cranial rotation. Unchanged degenerative anterolisthesis C3-C5 retrolisthesis C5 on 6. No evidence of traumatic listhesis. No abnormally widened, perched or jumped facets. Normal alignment of the craniocervical articulations. Arthrosis and narrowing of the atlantodental interval but with otherwise normal alignment of the atlantoaxial articulations. Skull base and vertebrae: No acute skull base fracture. No vertebral body fracture or height loss. Normal bone mineralization. No worrisome osseous lesions. Moderate arthrosis at the atlantodental and basion dens interval with some callus formation. Appearance is similar to prior. Multilevel cervical spondylitic changes are described below. Soft tissues and spinal canal: No pre or paravertebral fluid or swelling. No visible canal hematoma. Cervical carotid atherosclerosis at the level of the bifurcations. Airways are patent. Disc levels: Multilevel cervical spondylitic changes are present, maximal C5-C7. Slightly larger disc osteophyte complexes present C4-5 and C6-7 partially efface the ventral thecal sac with spurring, retrolisthesis and some ligamentum  flavum infolding at C5-6 resulting in mild canal stenosis. Multilevel uncinate spurring facet hypertrophic changes are present resulting in mild-to-moderate multilevel neural foraminal narrowing with more moderate to severe narrowing bilaterally C5-6 as well. Upper chest: No acute abnormality in the upper chest or imaged lung apices. Other: Normal thyroid. IMPRESSION: 1. No acute intracranial abnormality. No acute scalp swelling or calvarial fracture. 2. Stable parenchymal volume loss and chronic microvascular ischemic white matter disease. 3. Pneumatized secretions in the right maxillary and sphenoid sinuses. Additional mild pansinus mural disease and small bilateral mastoid effusions. Correlate for features of acute sinusitis. 4. No acute fracture or traumatic listhesis of the cervical spine. 5. Multilevel cervical spondylitic and facet degenerative changes, maximal C5-C7, as above. 6. Cervical and intracranial atherosclerosis. Electronically Signed   By: Lovena Le M.D.   On: 09/29/2020 00:20   CT  ABDOMEN PELVIS W CONTRAST  Result Date: 09/29/2020 CLINICAL DATA:  Status post fall and dizziness. EXAM: CT ABDOMEN AND PELVIS WITH CONTRAST TECHNIQUE: Multidetector CT imaging of the abdomen and pelvis was performed using the standard protocol following bolus administration of intravenous contrast. CONTRAST:  132mL OMNIPAQUE IOHEXOL 300 MG/ML  SOLN COMPARISON:  None. FINDINGS: Lower chest: No acute abnormality. Hepatobiliary: No focal liver abnormality is seen. No gallstones, gallbladder wall thickening, or biliary dilatation. Pancreas: Unremarkable. No pancreatic ductal dilatation or surrounding inflammatory changes. Spleen: Normal in size without focal abnormality. Adrenals/Urinary Tract: Adrenal glands are unremarkable. Kidneys are normal, without renal calculi, focal lesion, or hydronephrosis. Bladder is unremarkable. Stomach/Bowel: There is a small hiatal hernia. The appendix is surgically absent. No  evidence of bowel wall thickening, distention, or inflammatory changes. Noninflamed diverticula are seen throughout the large bowel. Vascular/Lymphatic: Moderate severity aortic calcification and tortuosity. No enlarged abdominal or pelvic lymph nodes. Reproductive: Status post hysterectomy. No adnexal masses. Other: No abdominal wall hernia or abnormality. Multiple surgical clips are seen within the mid and lower left abdomen and posterior aspect of the mid to lower pelvis. No abdominopelvic ascites. Musculoskeletal: There is marked severity dextroscoliosis of the lumbar spine with marked severity multilevel degenerative changes. IMPRESSION: 1. Small hiatal hernia. 2. Colonic diverticulosis. 3. Marked severity dextroscoliosis of the lumbar spine with marked severity multilevel degenerative changes. 4. Aortic atherosclerosis. Aortic Atherosclerosis (ICD10-I70.0). Electronically Signed   By: Virgina Norfolk M.D.   On: 09/29/2020 01:34    ____________________________________________   PROCEDURES  Procedure(s) performed (including Critical Care):  Procedures   ____________________________________________   INITIAL IMPRESSION / ASSESSMENT AND PLAN / ED COURSE   Patient with 2 episodes of syncope today.  Additionally she has a UTI.  CT of the head neck and abdomen did not show any acute causes for any of this or any injury sustained by this.  Chest x-ray also was essentially clear.  Uncertain of the etiology of patient's syncope.  Her troponin is elevated but stable.  She could possibly have had a dysrhythmia.  We will admit her and treat her UTI and try to elucidate the etiology of her syncope.             ____________________________________________   FINAL CLINICAL IMPRESSION(S) / ED DIAGNOSES  Final diagnoses:  Syncope and collapse  Urinary tract infection without hematuria, site unspecified     ED Discharge Orders    None      *Please note:  Melinda Maxwell was evaluated  in Emergency Department on 09/29/2020 for the symptoms described in the history of present illness. She was evaluated in the context of the global COVID-19 pandemic, which necessitated consideration that the patient might be at risk for infection with the SARS-CoV-2 virus that causes COVID-19. Institutional protocols and algorithms that pertain to the evaluation of patients at risk for COVID-19 are in a state of rapid change based on information released by regulatory bodies including the CDC and federal and state organizations. These policies and algorithms were followed during the patient's care in the ED.  Some ED evaluations and interventions may be delayed as a result of limited staffing during and the pandemic.*   Note:  This document was prepared using Dragon voice recognition software and may include unintentional dictation errors.    Nena Polio, MD 09/29/20 [phone]

## 2020-09-28 NOTE — ED Triage Notes (Signed)
Pt comes into the ED via [facility] from home c/o fall.  Pt has had increased dizziness for weeks.  Pt also had a syncopal episode in the bathroom.  Pt states she has a h/o parkinson's and Alzheimer.  Pt admits that she has had weakness in legs for weeks. Pt currently has even and unlabored respirations and denies any pain.

## 2020-09-28 NOTE — ED Notes (Signed)
Pt assisted to BR, stand-by assist only; pt able to transfer from w/c to commode without difficulty; no c/o voiced at present; pt voided qs; pt & daughter updated on wait time

## 2020-09-29 ENCOUNTER — Emergency Department: Payer: Medicare Other

## 2020-09-29 DIAGNOSIS — E876 Hypokalemia: Secondary | ICD-10-CM

## 2020-09-29 DIAGNOSIS — Z7901 Long term (current) use of anticoagulants: Secondary | ICD-10-CM

## 2020-09-29 DIAGNOSIS — I248 Other forms of acute ischemic heart disease: Secondary | ICD-10-CM | POA: Diagnosis present

## 2020-09-29 DIAGNOSIS — N3000 Acute cystitis without hematuria: Secondary | ICD-10-CM | POA: Diagnosis not present

## 2020-09-29 DIAGNOSIS — Z86718 Personal history of other venous thrombosis and embolism: Secondary | ICD-10-CM | POA: Diagnosis not present

## 2020-09-29 DIAGNOSIS — Z20822 Contact with and (suspected) exposure to covid-19: Secondary | ICD-10-CM | POA: Diagnosis present

## 2020-09-29 DIAGNOSIS — I5032 Chronic diastolic (congestive) heart failure: Secondary | ICD-10-CM | POA: Diagnosis present

## 2020-09-29 DIAGNOSIS — I472 Ventricular tachycardia: Secondary | ICD-10-CM | POA: Diagnosis not present

## 2020-09-29 DIAGNOSIS — N390 Urinary tract infection, site not specified: Secondary | ICD-10-CM | POA: Diagnosis present

## 2020-09-29 DIAGNOSIS — I110 Hypertensive heart disease with heart failure: Secondary | ICD-10-CM | POA: Diagnosis present

## 2020-09-29 DIAGNOSIS — Z9181 History of falling: Secondary | ICD-10-CM | POA: Diagnosis not present

## 2020-09-29 DIAGNOSIS — R55 Syncope and collapse: Secondary | ICD-10-CM | POA: Diagnosis present

## 2020-09-29 DIAGNOSIS — E782 Mixed hyperlipidemia: Secondary | ICD-10-CM | POA: Diagnosis present

## 2020-09-29 DIAGNOSIS — G20 Parkinson's disease: Secondary | ICD-10-CM

## 2020-09-29 DIAGNOSIS — I10 Essential (primary) hypertension: Secondary | ICD-10-CM | POA: Diagnosis not present

## 2020-09-29 DIAGNOSIS — I2510 Atherosclerotic heart disease of native coronary artery without angina pectoris: Secondary | ICD-10-CM | POA: Diagnosis present

## 2020-09-29 DIAGNOSIS — Z7982 Long term (current) use of aspirin: Secondary | ICD-10-CM | POA: Diagnosis not present

## 2020-09-29 DIAGNOSIS — I480 Paroxysmal atrial fibrillation: Secondary | ICD-10-CM | POA: Diagnosis present

## 2020-09-29 DIAGNOSIS — E119 Type 2 diabetes mellitus without complications: Secondary | ICD-10-CM

## 2020-09-29 DIAGNOSIS — G309 Alzheimer's disease, unspecified: Secondary | ICD-10-CM | POA: Diagnosis present

## 2020-09-29 DIAGNOSIS — I252 Old myocardial infarction: Secondary | ICD-10-CM | POA: Diagnosis not present

## 2020-09-29 DIAGNOSIS — Z853 Personal history of malignant neoplasm of breast: Secondary | ICD-10-CM | POA: Diagnosis not present

## 2020-09-29 DIAGNOSIS — F0280 Dementia in other diseases classified elsewhere without behavioral disturbance: Secondary | ICD-10-CM | POA: Diagnosis present

## 2020-09-29 DIAGNOSIS — Z955 Presence of coronary angioplasty implant and graft: Secondary | ICD-10-CM | POA: Diagnosis not present

## 2020-09-29 DIAGNOSIS — I495 Sick sinus syndrome: Secondary | ICD-10-CM | POA: Diagnosis present

## 2020-09-29 DIAGNOSIS — Z79899 Other long term (current) drug therapy: Secondary | ICD-10-CM | POA: Diagnosis not present

## 2020-09-29 DIAGNOSIS — Z90710 Acquired absence of both cervix and uterus: Secondary | ICD-10-CM | POA: Diagnosis not present

## 2020-09-29 LAB — CBC
HCT: 29.6 % — ABNORMAL LOW (ref 36.0–46.0)
Hemoglobin: 10.1 g/dL — ABNORMAL LOW (ref 12.0–15.0)
MCH: 30.8 pg (ref 26.0–34.0)
MCHC: 34.1 g/dL (ref 30.0–36.0)
MCV: 90.2 fL (ref 80.0–100.0)
Platelets: 235 10*3/uL (ref 150–400)
RBC: 3.28 MIL/uL — ABNORMAL LOW (ref 3.87–5.11)
RDW: 13.7 % (ref 11.5–15.5)
WBC: 7.1 10*3/uL (ref 4.0–10.5)
nRBC: 0 % (ref 0.0–0.2)

## 2020-09-29 LAB — HEMOGLOBIN A1C
Hgb A1c MFr Bld: 5.4 % (ref 4.8–5.6)
Mean Plasma Glucose: 108.28 mg/dL

## 2020-09-29 LAB — BASIC METABOLIC PANEL
Anion gap: 11 (ref 5–15)
BUN: 18 mg/dL (ref 8–23)
CO2: 25 mmol/L (ref 22–32)
Calcium: 9.4 mg/dL (ref 8.9–10.3)
Chloride: 100 mmol/L (ref 98–111)
Creatinine, Ser: 0.94 mg/dL (ref 0.44–1.00)
GFR, Estimated: >60 mL/min (ref >60)
Glucose, Bld: 98 mg/dL (ref 70–99)
Potassium: 3.9 mmol/L (ref 3.5–5.1)
Sodium: 136 mmol/L (ref 135–145)

## 2020-09-29 LAB — RESP PANEL BY RT-PCR (FLU A&B, COVID) ARPGX2
Influenza A by PCR: NEGATIVE
Influenza B by PCR: NEGATIVE
SARS Coronavirus 2 by RT PCR: NEGATIVE

## 2020-09-29 LAB — TROPONIN I (HIGH SENSITIVITY): Troponin I (High Sensitivity): 24 ng/L — ABNORMAL HIGH (ref <18)

## 2020-09-29 LAB — GLUCOSE, CAPILLARY
Glucose-Capillary: 99 mg/dL (ref 70–99)
Glucose-Capillary: 131 mg/dL — ABNORMAL HIGH (ref 70–99)

## 2020-09-29 LAB — CBG MONITORING, ED
Glucose-Capillary: 88 mg/dL (ref 70–99)
Glucose-Capillary: 112 mg/dL — ABNORMAL HIGH (ref 70–99)
Glucose-Capillary: 93 mg/dL (ref 70–99)

## 2020-09-29 MED ORDER — CARBIDOPA-LEVODOPA 25-100 MG PO TABS
2.0000 | ORAL_TABLET | Freq: Three times a day (TID) | ORAL | Status: DC
  Administered 2020-09-29 – 2020-10-06 (×20): 2 via ORAL
  Filled 2020-09-29 (×27): qty 2

## 2020-09-29 MED ORDER — ONDANSETRON HCL 4 MG PO TABS: 4.0000 mg | ORAL_TABLET | Freq: Four times a day (QID) | ORAL | Status: DC | PRN

## 2020-09-29 MED ORDER — POTASSIUM CHLORIDE 10 MEQ/100ML IV SOLN
10.0000 meq | INTRAVENOUS | Status: AC
  Administered 2020-09-29 (×3): 10 meq via INTRAVENOUS
  Filled 2020-09-29 (×3): qty 100

## 2020-09-29 MED ORDER — INSULIN ASPART 100 UNIT/ML ~~LOC~~ SOLN
0.0000 [IU] | Freq: Every day | SUBCUTANEOUS | Status: DC
  Filled 2020-09-29: qty 1

## 2020-09-29 MED ORDER — SIMVASTATIN 10 MG PO TABS
10.0000 mg | ORAL_TABLET | Freq: Every day | ORAL | Status: DC
  Administered 2020-09-29 – 2020-09-30 (×2): 10 mg via ORAL
  Filled 2020-09-29: qty 1

## 2020-09-29 MED ORDER — AMLODIPINE BESYLATE 5 MG PO TABS
5.0000 mg | ORAL_TABLET | Freq: Every day | ORAL | Status: DC
  Administered 2020-09-29 – 2020-10-04 (×6): 5 mg via ORAL
  Filled 2020-09-29 (×5): qty 1

## 2020-09-29 MED ORDER — ASPIRIN 81 MG PO CHEW
CHEWABLE_TABLET | ORAL | Status: AC
  Filled 2020-09-29: qty 1

## 2020-09-29 MED ORDER — DILTIAZEM HCL 25 MG/5ML IV SOLN
INTRAVENOUS | Status: AC
  Filled 2020-09-29: qty 5

## 2020-09-29 MED ORDER — DILTIAZEM HCL 25 MG/5ML IV SOLN
10.0000 mg | Freq: Once | INTRAVENOUS | Status: AC
  Administered 2020-09-29: 05:00:00 10 mg via INTRAVENOUS

## 2020-09-29 MED ORDER — CARBIDOPA-LEVODOPA 25-100 MG PO TABS
1.0000 | ORAL_TABLET | Freq: Every day | ORAL | Status: DC
  Administered 2020-09-29 – 2020-10-05 (×7): 1 via ORAL
  Filled 2020-09-29 (×8): qty 1

## 2020-09-29 MED ORDER — DONEPEZIL HCL 5 MG PO TABS
10.0000 mg | ORAL_TABLET | Freq: Every day | ORAL | Status: DC
  Administered 2020-09-29 – 2020-10-05 (×7): 10 mg via ORAL
  Filled 2020-09-29 (×8): qty 2

## 2020-09-29 MED ORDER — NITROGLYCERIN 0.4 MG SL SUBL: 0.4000 mg | SUBLINGUAL_TABLET | SUBLINGUAL | Status: DC | PRN

## 2020-09-29 MED ORDER — APIXABAN 5 MG PO TABS
5.0000 mg | ORAL_TABLET | Freq: Two times a day (BID) | ORAL | Status: DC
  Administered 2020-09-29 – 2020-10-02 (×7): 5 mg via ORAL
  Filled 2020-09-29 (×7): qty 1

## 2020-09-29 MED ORDER — ACETAMINOPHEN 650 MG RE SUPP: 650.0000 mg | Freq: Four times a day (QID) | RECTAL | Status: DC | PRN

## 2020-09-29 MED ORDER — ACETAMINOPHEN 325 MG PO TABS: 650.0000 mg | ORAL_TABLET | Freq: Four times a day (QID) | ORAL | Status: DC | PRN

## 2020-09-29 MED ORDER — SODIUM CHLORIDE 0.9 % IV SOLN
1.0000 g | Freq: Once | INTRAVENOUS | Status: AC
  Administered 2020-09-29: 02:00:00 1 g via INTRAVENOUS
  Filled 2020-09-29: qty 10

## 2020-09-29 MED ORDER — ASPIRIN 81 MG PO CHEW
81.0000 mg | CHEWABLE_TABLET | Freq: Every day | ORAL | Status: DC
  Administered 2020-09-29 – 2020-10-06 (×7): 81 mg via ORAL
  Filled 2020-09-29 (×6): qty 1

## 2020-09-29 MED ORDER — ALLOPURINOL 100 MG PO TABS
300.0000 mg | ORAL_TABLET | Freq: Every day | ORAL | Status: DC
  Administered 2020-09-29 – 2020-10-06 (×7): 300 mg via ORAL
  Filled 2020-09-29 (×6): qty 3
  Filled 2020-09-29: qty 1

## 2020-09-29 MED ORDER — SODIUM CHLORIDE 0.9 % IV SOLN
INTRAVENOUS | Status: DC | PRN
  Administered 2020-09-29: 22:00:00 250 mL via INTRAVENOUS

## 2020-09-29 MED ORDER — NYSTATIN 100000 UNIT/GM EX POWD
Freq: Three times a day (TID) | CUTANEOUS | Status: DC
  Filled 2020-09-29: qty 15

## 2020-09-29 MED ORDER — INSULIN ASPART 100 UNIT/ML ~~LOC~~ SOLN
0.0000 [IU] | Freq: Three times a day (TID) | SUBCUTANEOUS | Status: DC
  Administered 2020-10-03 – 2020-10-04 (×3): 1 [IU] via SUBCUTANEOUS
  Filled 2020-09-29 (×3): qty 1

## 2020-09-29 MED ORDER — SODIUM CHLORIDE 0.9 % IV SOLN
1.0000 g | INTRAVENOUS | Status: DC
  Administered 2020-09-29: 22:00:00 1 g via INTRAVENOUS
  Filled 2020-09-29: qty 10
  Filled 2020-09-29: qty 1

## 2020-09-29 MED ORDER — SODIUM CHLORIDE 0.9% FLUSH
3.0000 mL | Freq: Two times a day (BID) | INTRAVENOUS | Status: DC
  Administered 2020-09-29 – 2020-10-06 (×13): 3 mL via INTRAVENOUS

## 2020-09-29 MED ORDER — ONDANSETRON HCL 4 MG/2ML IJ SOLN
4.0000 mg | Freq: Four times a day (QID) | INTRAMUSCULAR | Status: DC | PRN
Start: 2020-09-29 — End: 2020-10-05

## 2020-09-29 NOTE — H&P (Addendum)
History and Physical    Melinda Maxwell [id number] DOB: 1940/12/07 DOA: 09/28/2020  PCP: [name], MD   Patient coming from: Home  I have personally briefly reviewed patient's old medical records in [place]  Chief Complaint: Syncope x2  HPI: Melinda Maxwell is a 79 y.o. female with medical history significant for CAD, diastolic CHF, paroxysmal A. fib on Xarelto, Parkinson's disease, chronic dizziness and physical deconditioning, HTN, hospitalized in October 2021 for 2 syncopal events and fall with subdural hematoma, seen  cardiology for nonsustained V. tach,  during her stay with plans for outpatient loop recorder which has not yet been arranged, who was brought into the emergency room after she had two brief syncopal events at home, resulting in a fall without apparent injury.  She denied chest pains, headache, one-sided weakness or numbness.  History limited due to dementia. ED course: On arrival in the ER she was afebrile with BP 170/98, pulse 70 with O2 sat 95% on room air.  Blood work significant for potassium of 2.8.  Hemoglobin 10.7 which is her baseline.  Renal function within normal limits.  Troponin of 24 and 24.  Total CK 29.  Urinalysis with pyuria., EKG: NSR at 66 with nonspecific ST-T wave changes Imaging: Extensive imaging to include CT head and C-spine and abdomen and pelvis as well as x-rays of chest thoracic and lumbar spine.  All imaging studies nonacute  Patient started on Rocephin.  Hospitalist consulted for admission. Following admission patient went into rapid A. fib that was controlled with a single 10 mg bolus of IV diltiazem.  Review of Systems: As per HPI otherwise all other systems on review of systems negative.    Past Medical History:  Diagnosis Date  . Arthritis   . Asthma without status asthmaticus    unspecified  . Breast cancer ([place])   . Coronary artery disease   . Diverticulosis   . DVT (deep venous thrombosis) ([place])   . Edema    . Gout   . Hyperlipidemia   . Hypertension   . MI, old   . Myocardial infarct ([place]) 2004  . Parkinson's disease ([place])   . Sleep apnea     Past Surgical History:  Procedure Laterality Date  . ABDOMINAL HYSTERECTOMY    . APPENDECTOMY    . BREAST LUMPECTOMY     L breast  . CATARACT EXTRACTION    . COLONOSCOPY     08/27/1989, 01/03/1998, 06/09/2004  . CORONARY STENT INTERVENTION N/A 02/11/2018   Procedure: CORONARY STENT INTERVENTION;  Surgeon: Nelva Bush, MD;  Location: [facility];  Service: Cardiovascular;  Laterality: N/A;  . ESOPHAGOGASTRODUODENOSCOPY  06/09/2004  . FLEXIBLE SIGMOIDOSCOPY    . HEMICOLECTOMY    . LEFT HEART CATH AND CORONARY ANGIOGRAPHY Left 02/11/2018   Procedure: LEFT HEART CATH AND CORONARY ANGIOGRAPHY;  Surgeon: Corey Skains, MD;  Location: [facility];  Service: Cardiovascular;  Laterality: Left;  . ORIF ANKLE FRACTURE  2004     reports that she has never smoked. She has never used smokeless tobacco. She reports that she does not drink alcohol and does not use drugs.  Allergies  Allergen Reactions  . Benzocaine Other (See Comments)    Unknown  . Lisinopril Cough  . Neomycin Other (See Comments)    On allergy testing  . Sulfamethoxazole-Trimethoprim Other (See Comments)    Unknown  . Sulfa Antibiotics Rash    Family History  Problem Relation Age of Onset  .  Angina Mother   . Hypertension Mother   . Lung cancer Mother   . Hypertension Father   . Heart attack Father   . Nephrolithiasis Sister   . Hypertension Other        sibling      Prior to Admission medications   Medication Sig Start Date End Date Taking? Authorizing Provider  allopurinol (ZYLOPRIM) 300 MG tablet Take 300 mg by mouth daily.   Yes [provider]  apixaban (ELIQUIS) 5 MG TABS tablet Take 1 tablet (5 mg total) by mouth 2 (two) times daily. 07/25/19  Yes Kayleen Memos, DO  aspirin 81 MG chewable tablet Chew 1 tablet (81 mg total) by  mouth daily. 07/25/19  Yes Hall, Carole N, DO  carbidopa-levodopa (SINEMET IR) 25-100 MG tablet Take 1-2 tablets by mouth 4 (four) times daily. Take 2 tablets at breakfast, lunch and dinner Take 1  tablet at bedtime   Yes [provider]  Cholecalciferol (VITAMIN D3) 50 MCG (2000 UT) capsule Take by mouth. 11/12/18  Yes [provider]  colchicine 0.6 MG tablet Take by mouth. 10/06/18  Yes [provider]  diltiazem (CARDIZEM CD) 180 MG 24 hr capsule Take 180 mg by mouth daily.   Yes [provider]  donepezil (ARICEPT) 10 MG tablet Take 10 mg by mouth at bedtime.   Yes [provider]  hydrALAZINE (APRESOLINE) 10 MG tablet Take 1 tablet (10 mg total) by mouth every 8 (eight) hours. 07/25/19 08/24/19 Yes Hall, Carole N, DO  mometasone (ELOCON) 0.1 % lotion Apply 1 application topically at bedtime as needed.   Yes [provider]  pantoprazole (PROTONIX) 40 MG tablet Take 40 mg by mouth daily.  12/31/17  Yes [provider]  simvastatin (ZOCOR) 10 MG tablet Take 10 mg by mouth daily. HLD- reduced d/t interaction with Diltiazem- increased r/f Rhabdomyelosis   Yes [provider]  nitroGLYCERIN (NITROSTAT) 0.4 MG SL tablet Place 0.4 mg under the tongue every 5 (five) minutes as needed for chest pain.  12/31/17   [provider]    Physical Exam: Vitals:   09/28/20 1401 09/28/20 1658 09/28/20 2113 09/29/20 0119  BP: (!) 196/81 (!) 193/88 (!) 170/98 (!) 139/97  Pulse: 76 74 70 66  Resp: 19 20 18 16   Temp: 98.3 F (36.8 C) 97.7 F (36.5 C) 97.8 F (36.6 C)   TempSrc: Oral Oral Oral   SpO2: 95% 98% 95% 98%  Weight:      Height:         Vitals:   09/28/20 1401 09/28/20 1658 09/28/20 2113 09/29/20 0119  BP: (!) 196/81 (!) 193/88 (!) 170/98 (!) 139/97  Pulse: 76 74 70 66  Resp: 19 20 18 16   Temp: 98.3 F (36.8 C) 97.7 F (36.5 C) 97.8 F (36.6 C)   TempSrc: Oral Oral Oral   SpO2: 95% 98% 95% 98%  Weight:       Height:          Constitutional: Alert and oriented x 2 . Not in any apparent distress HEENT:      Head: Normocephalic and atraumatic.         Eyes: PERLA, EOMI, Conjunctivae are normal. Sclera is non-icteric.       Mouth/Throat: Mucous membranes are moist.       Neck: Supple with no signs of meningismus. Cardiovascular:  Irregularly irregular. No murmurs, gallops, or rubs. 2+ symmetrical distal pulses are present . No JVD. No LE edema  Respiratory: Respiratory effort normal .Lungs sounds clear bilaterally. No wheezes, crackles, or rhonchi.  Gastrointestinal: Soft, non tender, and non distended with positive bowel sounds.  Genitourinary: No CVA tenderness. Musculoskeletal: Nontender with normal range of motion in all extremities. No cyanosis, or erythema of extremities. Neurologic:  Face is symmetric. Moving all extremities. No gross focal neurologic deficits . Skin: Skin is warm, dry.  No rash or ulcers Psychiatric: Mood and affect are normal    Labs on Admission: I have personally reviewed following labs and imaging studies  CBC: Recent Labs  Lab 09/28/20 1221  WBC 6.1  HGB 10.7*  HCT 32.3*  MCV 91.0  PLT 123456   Basic Metabolic Panel: Recent Labs  Lab 09/28/20 1221  NA 135  K 2.8*  CL 96*  CO2 26  GLUCOSE 103*  BUN 17  CREATININE 0.94  CALCIUM 9.7   GFR: Estimated Creatinine Clearance: 38.8 mL/min (by C-G formula based on SCr of 0.94 mg/dL). Liver Function Tests: No results for input(s): AST, ALT, ALKPHOS, BILITOT, PROT, ALBUMIN in the last 168 hours. No results for input(s): LIPASE, AMYLASE in the last 168 hours. No results for input(s): AMMONIA in the last 168 hours. Coagulation Profile: No results for input(s): INR, PROTIME in the last 168 hours. Cardiac Enzymes: Recent Labs  Lab 09/28/20 1221  CKTOTAL 29*   BNP (last 3 results) No results for input(s): PROBNP in the last 8760 hours. HbA1C: No results for input(s): HGBA1C in the last 72  hours. CBG: No results for input(s): GLUCAP in the last 168 hours. Lipid Profile: No results for input(s): CHOL, HDL, LDLCALC, TRIG, CHOLHDL, LDLDIRECT in the last 72 hours. Thyroid Function Tests: No results for input(s): TSH, T4TOTAL, FREET4, T3FREE, THYROIDAB in the last 72 hours. Anemia Panel: No results for input(s): VITAMINB12, FOLATE, FERRITIN, TIBC, IRON, RETICCTPCT in the last 72 hours. Urine analysis:    Component Value Date/Time   COLORURINE YELLOW (A) 09/28/2020 1221   APPEARANCEUR CLOUDY (A) 09/28/2020 1221   LABSPEC 1.015 09/28/2020 1221   PHURINE 6.0 09/28/2020 1221   GLUCOSEU NEGATIVE 09/28/2020 1221   HGBUR SMALL (A) 09/28/2020 1221   BILIRUBINUR NEGATIVE 09/28/2020 1221   KETONESUR 5 (A) 09/28/2020 1221   PROTEINUR 100 (A) 09/28/2020 1221   NITRITE NEGATIVE 09/28/2020 1221   LEUKOCYTESUR LARGE (A) 09/28/2020 1221    Radiological Exams on Admission: DG Chest 2 View  Result Date: 09/28/2020 CLINICAL DATA:  Dizziness. EXAM: CHEST - 2 VIEW COMPARISON:  October 15, 2019 FINDINGS: Chronic scarring is seen within the lateral aspect of the left lung base. There is no evidence of acute infiltrate, pleural effusion or pneumothorax. The heart size and mediastinal contours are within normal limits. A chronic deformity is seen involving the proximal left humerus. Degenerative changes seen throughout the thoracic spine. IMPRESSION: Chronic left basilar scarring without acute or active cardiopulmonary disease. Electronically Signed   By: Virgina Norfolk M.D.   On: 09/28/2020 23:52   DG Thoracic Spine 2 View  Result Date: 09/28/2020 CLINICAL DATA:  Status post fall. EXAM: THORACIC SPINE 2 VIEWS COMPARISON:  None. FINDINGS: There is no evidence of an acute thoracic spine fracture. There is mild levoscoliosis of the lower thoracic spine. Mild-to-moderate severity multilevel endplate sclerosis is seen with multilevel intervertebral disc space narrowing. No other significant bone  abnormalities are identified. IMPRESSION: 1. Mild-to-moderate severity multilevel degenerative changes without an acute osseous abnormality. Electronically Signed   By: Virgina Norfolk M.D.   On: 09/28/2020 23:53   DG  Lumbar Spine Complete  Result Date: 09/28/2020 CLINICAL DATA:  Status post fall. EXAM: LUMBAR SPINE - COMPLETE 4+ VIEW COMPARISON:  None. FINDINGS: There is no evidence of an acute lumbar spine fracture. There is marked severity dextroscoliosis of the lumbar spine. There is moderate to marked severity multilevel endplate sclerosis with moderate to marked severity multilevel intervertebral disc space narrowing. Multiple radiopaque surgical clips and surgical sutures are seen within the pelvis. IMPRESSION: 1. Marked severity dextroscoliosis of the lumbar spine with moderate to marked severity multilevel degenerative disc disease. Electronically Signed   By: Virgina Norfolk M.D.   On: 09/28/2020 23:54   CT Head Wo Contrast  Result Date: 09/29/2020 CLINICAL DATA:  Fall EXAM: CT HEAD WITHOUT CONTRAST CT CERVICAL SPINE WITHOUT CONTRAST TECHNIQUE: Multidetector CT imaging of the head and cervical spine was performed following the standard protocol without intravenous contrast. Multiplanar CT image reconstructions of the cervical spine were also generated. COMPARISON:  CT head 07/22/2019, 07/17/2019, CT head and cervical spine 07/16/2019 FINDINGS: CT HEAD FINDINGS Brain: No evidence of acute infarction, hemorrhage, hydrocephalus, extra-axial collection, visible mass lesion or mass effect. Symmetric prominence of the ventricles, cisterns and sulci compatible with parenchymal volume loss. Patchy areas of white matter hypoattenuation are most compatible with chronic microvascular angiopathy. Vascular: Atherosclerotic calcification of the carotid siphons and intradural vertebral arteries. No hyperdense vessel. Skull: No calvarial fracture or suspicious osseous lesion. No scalp swelling or hematoma.  Sinuses/Orbits: Pneumatized secretions in the right maxillary and sphenoid sinuses. Additional mild pansinus mural disease and small bilateral mastoid effusions. Orbital structures are unremarkable aside from prior lens extractions. Other: None. CT CERVICAL SPINE FINDINGS Alignment: Stabilization collar is absent at the time of examination. There is mild rightward lateral cervical flexion and slight rightward cranial rotation. Unchanged degenerative anterolisthesis C3-C5 retrolisthesis C5 on 6. No evidence of traumatic listhesis. No abnormally widened, perched or jumped facets. Normal alignment of the craniocervical articulations. Arthrosis and narrowing of the atlantodental interval but with otherwise normal alignment of the atlantoaxial articulations. Skull base and vertebrae: No acute skull base fracture. No vertebral body fracture or height loss. Normal bone mineralization. No worrisome osseous lesions. Moderate arthrosis at the atlantodental and basion dens interval with some callus formation. Appearance is similar to prior. Multilevel cervical spondylitic changes are described below. Soft tissues and spinal canal: No pre or paravertebral fluid or swelling. No visible canal hematoma. Cervical carotid atherosclerosis at the level of the bifurcations. Airways are patent. Disc levels: Multilevel cervical spondylitic changes are present, maximal C5-C7. Slightly larger disc osteophyte complexes present C4-5 and C6-7 partially efface the ventral thecal sac with spurring, retrolisthesis and some ligamentum flavum infolding at C5-6 resulting in mild canal stenosis. Multilevel uncinate spurring facet hypertrophic changes are present resulting in mild-to-moderate multilevel neural foraminal narrowing with more moderate to severe narrowing bilaterally C5-6 as well. Upper chest: No acute abnormality in the upper chest or imaged lung apices. Other: Normal thyroid. IMPRESSION: 1. No acute intracranial abnormality. No acute  scalp swelling or calvarial fracture. 2. Stable parenchymal volume loss and chronic microvascular ischemic white matter disease. 3. Pneumatized secretions in the right maxillary and sphenoid sinuses. Additional mild pansinus mural disease and small bilateral mastoid effusions. Correlate for features of acute sinusitis. 4. No acute fracture or traumatic listhesis of the cervical spine. 5. Multilevel cervical spondylitic and facet degenerative changes, maximal C5-C7, as above. 6. Cervical and intracranial atherosclerosis. Electronically Signed   By: Lovena Le M.D.   On: 09/29/2020 00:20   CT Cervical  Spine Wo Contrast  Result Date: 09/29/2020 CLINICAL DATA:  Fall EXAM: CT HEAD WITHOUT CONTRAST CT CERVICAL SPINE WITHOUT CONTRAST TECHNIQUE: Multidetector CT imaging of the head and cervical spine was performed following the standard protocol without intravenous contrast. Multiplanar CT image reconstructions of the cervical spine were also generated. COMPARISON:  CT head 07/22/2019, 07/17/2019, CT head and cervical spine 07/16/2019 FINDINGS: CT HEAD FINDINGS Brain: No evidence of acute infarction, hemorrhage, hydrocephalus, extra-axial collection, visible mass lesion or mass effect. Symmetric prominence of the ventricles, cisterns and sulci compatible with parenchymal volume loss. Patchy areas of white matter hypoattenuation are most compatible with chronic microvascular angiopathy. Vascular: Atherosclerotic calcification of the carotid siphons and intradural vertebral arteries. No hyperdense vessel. Skull: No calvarial fracture or suspicious osseous lesion. No scalp swelling or hematoma. Sinuses/Orbits: Pneumatized secretions in the right maxillary and sphenoid sinuses. Additional mild pansinus mural disease and small bilateral mastoid effusions. Orbital structures are unremarkable aside from prior lens extractions. Other: None. CT CERVICAL SPINE FINDINGS Alignment: Stabilization collar is absent at the time of  examination. There is mild rightward lateral cervical flexion and slight rightward cranial rotation. Unchanged degenerative anterolisthesis C3-C5 retrolisthesis C5 on 6. No evidence of traumatic listhesis. No abnormally widened, perched or jumped facets. Normal alignment of the craniocervical articulations. Arthrosis and narrowing of the atlantodental interval but with otherwise normal alignment of the atlantoaxial articulations. Skull base and vertebrae: No acute skull base fracture. No vertebral body fracture or height loss. Normal bone mineralization. No worrisome osseous lesions. Moderate arthrosis at the atlantodental and basion dens interval with some callus formation. Appearance is similar to prior. Multilevel cervical spondylitic changes are described below. Soft tissues and spinal canal: No pre or paravertebral fluid or swelling. No visible canal hematoma. Cervical carotid atherosclerosis at the level of the bifurcations. Airways are patent. Disc levels: Multilevel cervical spondylitic changes are present, maximal C5-C7. Slightly larger disc osteophyte complexes present C4-5 and C6-7 partially efface the ventral thecal sac with spurring, retrolisthesis and some ligamentum flavum infolding at C5-6 resulting in mild canal stenosis. Multilevel uncinate spurring facet hypertrophic changes are present resulting in mild-to-moderate multilevel neural foraminal narrowing with more moderate to severe narrowing bilaterally C5-6 as well. Upper chest: No acute abnormality in the upper chest or imaged lung apices. Other: Normal thyroid. IMPRESSION: 1. No acute intracranial abnormality. No acute scalp swelling or calvarial fracture. 2. Stable parenchymal volume loss and chronic microvascular ischemic white matter disease. 3. Pneumatized secretions in the right maxillary and sphenoid sinuses. Additional mild pansinus mural disease and small bilateral mastoid effusions. Correlate for features of acute sinusitis. 4. No  acute fracture or traumatic listhesis of the cervical spine. 5. Multilevel cervical spondylitic and facet degenerative changes, maximal C5-C7, as above. 6. Cervical and intracranial atherosclerosis. Electronically Signed   By: Lovena Le M.D.   On: 09/29/2020 00:20   CT ABDOMEN PELVIS W CONTRAST  Result Date: 09/29/2020 CLINICAL DATA:  Status post fall and dizziness. EXAM: CT ABDOMEN AND PELVIS WITH CONTRAST TECHNIQUE: Multidetector CT imaging of the abdomen and pelvis was performed using the standard protocol following bolus administration of intravenous contrast. CONTRAST:  137mL OMNIPAQUE IOHEXOL 300 MG/ML  SOLN COMPARISON:  None. FINDINGS: Lower chest: No acute abnormality. Hepatobiliary: No focal liver abnormality is seen. No gallstones, gallbladder wall thickening, or biliary dilatation. Pancreas: Unremarkable. No pancreatic ductal dilatation or surrounding inflammatory changes. Spleen: Normal in size without focal abnormality. Adrenals/Urinary Tract: Adrenal glands are unremarkable. Kidneys are normal, without renal calculi, focal lesion, or hydronephrosis.  Bladder is unremarkable. Stomach/Bowel: There is a small hiatal hernia. The appendix is surgically absent. No evidence of bowel wall thickening, distention, or inflammatory changes. Noninflamed diverticula are seen throughout the large bowel. Vascular/Lymphatic: Moderate severity aortic calcification and tortuosity. No enlarged abdominal or pelvic lymph nodes. Reproductive: Status post hysterectomy. No adnexal masses. Other: No abdominal wall hernia or abnormality. Multiple surgical clips are seen within the mid and lower left abdomen and posterior aspect of the mid to lower pelvis. No abdominopelvic ascites. Musculoskeletal: There is marked severity dextroscoliosis of the lumbar spine with marked severity multilevel degenerative changes. IMPRESSION: 1. Small hiatal hernia. 2. Colonic diverticulosis. 3. Marked severity dextroscoliosis of the lumbar  spine with marked severity multilevel degenerative changes. 4. Aortic atherosclerosis. Aortic Atherosclerosis (ICD10-I70.0). Electronically Signed   By: Virgina Norfolk M.D.   On: 09/29/2020 01:34     Assessment/Plan 79 year old female with history of CAD, diastolic CHF, paroxysmal A. fib on Xarelto, Parkinson's disease, chronic dizziness and physical deconditioning, HTN, hospitalized in October 2021 for 2 syncopal events , seen by cardiology with plans for outpatient loop recorder, presenting with 2 syncopal events, sustaining a fall without injury.  UTI on work-up    Syncope -Patient with history of syncope, chronic dizziness and chronic deconditioning, followed by neurology and cardiology, -Patient has a history of nonsustained V. tach but has not yet had outpatient loop recorder as recommended at last hospitalization -Contributing factors include acute UTI, hypokalemia  -Continuous cardiac monitoring -Had echocardiogram in October that showed EF 60 to 65% with no other major significant abnormalities -Treat UTI, treat hypokalemia -Cardiology consulted    UTI (urinary tract infection) -IV Rocephin    Hypokalemia -IV and oral potassium repletion    CAD (coronary artery disease) -Continue aspirin    Benign essential hypertension -Continue hydralazine   Paroxysmal atrial fibrillation with RVR  Chronic anticoagulation -Continue apixaban and diltiazem -Addendum: Following admission patient went into rapid A. fib achieving rate control following a single IV diltiazem bolus of 10 mg    Chronic diastolic CHF (congestive heart failure) ([place]) -Patient appears euvolemic -Not currently on beta-blocker, ACE/ARB or diuretic therapy -EF 60 to 65% in October 2021    Type 2 diabetes mellitus without complication ([place]) -Not currently on medication    Parkinson disease ([place]) -Continue carbidopa levodopa    DVT prophylaxis: Lovenox  Code Status: full code  Family Communication:  none   Disposition Plan: Back to previous home environment Consults called: Cardiology Status: Observation    Athena Masse MD [facility]     09/29/2020, 2:50 AM

## 2020-09-29 NOTE — Evaluation (Signed)
Physical Therapy Evaluation Patient Details Name: Melinda Maxwell MRN: [id number] DOB: Apr 21, 1941 Today's Date: 09/29/2020   History of Present Illness  Patient is an 79 y.o. female with medical history significant for CAD, diastolic CHF, paroxysmal A. fib,, Parkinson's disease, chronic dizziness and physical deconditioning, HTN, hospitalized in October syncopal and fall with subdural hematoma. Patient brought into the emergency room after she had two brief syncopal events at home, resulting in a fall without injury.    Clinical Impression  PT evaluation completed. Patient cooperative and following commands without difficulty. Patient is oriented to self and situation. Patient required minimal assistance for bed mobility and minimal assistance for standing. Patient has poor standing balance and required moderate assistance for taking 2-3 steps at edge of bed. Patient does express fear of falling. No dizziness is reported with activity during this assessment, however patient does reports she frequently is dizzy with activity at home. Recommend PT to maximize independence and address remaining functional limitations. SNF is recommended at discharge.     Follow Up Recommendations SNF    Equipment Recommendations  None recommended by PT    Recommendations for Other Services       Precautions / Restrictions Precautions Precautions: Fall Restrictions Weight Bearing Restrictions: No      Mobility  Bed Mobility Overal bed mobility: Needs Assistance Bed Mobility: Supine to Sit;Sit to Supine     Supine to sit: Min guard;HOB elevated Sit to supine: Mod assist;HOB elevated   General bed mobility comments: assistance for BLE support. cues for technique    Transfers Overall transfer level: Needs assistance Equipment used: None Transfers: Sit to/from Stand Sit to Stand: Min assist         General transfer comment: steadying assistance provided for  standing.  Ambulation/Gait Ambulation/Gait assistance: Mod assist Gait Distance (Feet): 1 Feet Assistive device: 1 person hand held assist Gait Pattern/deviations: Narrow base of support     General Gait Details: patient able to take 3 small side steps towards right with moderate assistance. patient is unsteady with ambulation attempts and has limited overall standing tolerance for progression of further activity at this time  Stairs            Wheelchair Mobility    Modified Rankin (Stroke Patients Only)       Balance Overall balance assessment: Needs assistance Sitting-balance support: Bilateral upper extremity supported;Feet unsupported Sitting balance-Leahy Scale: Fair     Standing balance support: Bilateral upper extremity supported Standing balance-Leahy Scale: Poor Standing balance comment: Min A- Mod A to maintain standing balance                             Pertinent Vitals/Pain Pain Assessment: No/denies pain    Home Living Family/patient expects to be discharged to:: Private residence Living Arrangements: Children Available Help at Discharge: Family;Available PRN/intermittently (per patient, daughter works during the day) Type of Home: House         Home Equipment: Environmental consultant - 2 wheels      Prior Function Level of Independence: Needs assistance   Gait / Transfers Assistance Needed: patient reports she ambulates with rolling walker at home, limited distances  ADL's / Homemaking Assistance Needed: patient reports she has assistance with ADLs at baseline (patient mentioned someone that comes in to help her with bathing but patient is a poor historian)        Hand Dominance   Dominant Hand: Right    Extremity/Trunk Assessment  Upper Extremity Assessment Upper Extremity Assessment: Generalized weakness    Lower Extremity Assessment Lower Extremity Assessment: Generalized weakness       Communication   Communication: No  difficulties  Cognition Arousal/Alertness: Awake/alert Behavior During Therapy: WFL for tasks assessed/performed Overall Cognitive Status: No family/caregiver present to determine baseline cognitive functioning                                 General Comments: patient able to follow single step commands consistently. patient is oriented to person, situation but not place or time.      General Comments      Exercises General Exercises - Lower Extremity Ankle Circles/Pumps: AROM;Strengthening;Both;10 reps;Supine Heel Slides: AAROM;Strengthening;Both;10 reps;Supine Hip ABduction/ADduction: AAROM;Strengthening;Both;10 reps;Supine Straight Leg Raises: AAROM;Strengthening;Both;10 reps;Supine   Assessment/Plan    PT Assessment Patient needs continued PT services  PT Problem List Decreased strength;Decreased activity tolerance;Decreased balance;Decreased mobility;Decreased safety awareness;Decreased cognition       PT Treatment Interventions DME instruction;Gait training;Stair training;Functional mobility training;Therapeutic activities;Therapeutic exercise;Balance training;Neuromuscular re-education    PT Goals (Current goals can be found in the Care Plan section)  Acute Rehab PT Goals Patient Stated Goal: to get stronger PT Goal Formulation: With patient Time For Goal Achievement: 10/13/20 Potential to Achieve Goals: Fair    Frequency Min 2X/week   Barriers to discharge        Co-evaluation               AM-PAC PT "6 Clicks" Mobility  Outcome Measure Help needed turning from your back to your side while in a flat bed without using bedrails?: A Little Help needed moving from lying on your back to sitting on the side of a flat bed without using bedrails?: A Lot Help needed moving to and from a bed to a chair (including a wheelchair)?: A Lot Help needed standing up from a chair using your arms (e.g., wheelchair or bedside chair)?: A Lot Help needed to walk  in hospital room?: A Lot Help needed climbing 3-5 steps with a railing? : Total 6 Click Score: 12    End of Session   Activity Tolerance: Patient limited by fatigue Patient left: in bed (on stretcher in ED hallway) Nurse Communication: Mobility status PT Visit Diagnosis: Unsteadiness on feet (R26.81);Muscle weakness (generalized) (M62.81)    Time: [id number] PT Time Calculation (min) (ACUTE ONLY): 26 min   Charges:   PT Evaluation $PT Eval Moderate Complexity: 1 Mod PT Treatments $Therapeutic Exercise: 8-22 mins       Minna Merritts, PT, MPT   Percell Locus 09/29/2020, 1:11 PM

## 2020-09-29 NOTE — ED Notes (Signed)
ED TO INPATIENT HANDOFF REPORT  ED Nurse Name and Phone #: Argie Applegate [phone]  S Name/Age/Gender Melinda Maxwell 79 y.o. female Room/Bed: ED31A/ED31A  Code Status   Code Status: Full Code  Home/SNF/Other Home Patient oriented to: self, place and situation Is this baseline? Yes   Triage Complete: Triage complete  Chief Complaint Syncope [R55] UTI (urinary tract infection) [N39.0]  Triage Note Pt in via EMS from home with c/o falling and syncopal episode in the bathroom. 140/90, HR 86  Pt comes into the ED via [facility] from home c/o fall.  Pt has had increased dizziness for weeks.  Pt also had a syncopal episode in the bathroom.  Pt states she has a h/o parkinson's and Alzheimer.  Pt admits that she has had weakness in legs for weeks. Pt currently has even and unlabored respirations and denies any pain.      Allergies Allergies  Allergen Reactions  . Benzocaine Other (See Comments)    Unknown  . Lisinopril Cough  . Neomycin Other (See Comments)    On allergy testing  . Sulfamethoxazole-Trimethoprim Other (See Comments)    Unknown  . Sulfa Antibiotics Rash    Level of Care/Admitting Diagnosis ED Disposition    ED Disposition Condition Comment   Admit  Hospital Area: [facility] [[id number]]  Level of Care: Med-Surg [16]  Covid Evaluation: Confirmed COVID Negative  Diagnosis: UTI (urinary tract infection) [960454]  Admitting Physician: Marrion Coy [[id number]]  Attending Physician: Marrion Coy [[id number]]  Estimated length of stay: past midnight tomorrow  Certification:: I certify this patient will need inpatient services for at least 2 midnights       B Medical/Surgery History Past Medical History:  Diagnosis Date  . Arthritis   . Asthma without status asthmaticus    unspecified  . Breast cancer (HCC)   . Coronary artery disease   . Diverticulosis   . DVT (deep venous thrombosis) (HCC)   . Edema   . Gout   . Hyperlipidemia   . Hypertension   .  MI, old   . Myocardial infarct (HCC) 2004  . Parkinson's disease (HCC)   . Sleep apnea    Past Surgical History:  Procedure Laterality Date  . ABDOMINAL HYSTERECTOMY    . APPENDECTOMY    . BREAST LUMPECTOMY     L breast  . CATARACT EXTRACTION    . COLONOSCOPY     08/27/1989, 01/03/1998, 06/09/2004  . CORONARY STENT INTERVENTION N/A 02/11/2018   Procedure: CORONARY STENT INTERVENTION;  Surgeon: Yvonne Kendall, MD;  Location: [facility] INVASIVE CV LAB;  Service: Cardiovascular;  Laterality: N/A;  . ESOPHAGOGASTRODUODENOSCOPY  06/09/2004  . FLEXIBLE SIGMOIDOSCOPY    . HEMICOLECTOMY    . LEFT HEART CATH AND CORONARY ANGIOGRAPHY Left 02/11/2018   Procedure: LEFT HEART CATH AND CORONARY ANGIOGRAPHY;  Surgeon: Lamar Blinks, MD;  Location: [facility] INVASIVE CV LAB;  Service: Cardiovascular;  Laterality: Left;  . ORIF ANKLE FRACTURE  2004     A IV Location/Drains/Wounds Patient Lines/Drains/Airways Status    Active Line/Drains/Airways    Name Placement date Placement time Site Days   Peripheral IV 09/29/20 Right;Upper Arm 09/29/20  0032  Arm  less than 1   External Urinary Catheter 09/29/20  1421  --  less than 1          Intake/Output Last 24 hours No intake or output data in the 24 hours ending 09/29/20 1459  Labs/Imaging Results for orders placed or performed during the hospital encounter  of 09/28/20 (from the past 48 hour(s))  Basic metabolic panel     Status: Abnormal   Collection Time: 09/28/20 12:21 PM  Result Value Ref Range   Sodium 135 135 - 145 mmol/L   Potassium 2.8 (L) 3.5 - 5.1 mmol/L   Chloride 96 (L) 98 - 111 mmol/L   CO2 26 22 - 32 mmol/L   Glucose, Bld 103 (H) 70 - 99 mg/dL    Comment: Glucose reference range applies only to samples taken after fasting for at least 8 hours.   BUN 17 8 - 23 mg/dL   Creatinine, Ser 0.94 0.44 - 1.00 mg/dL   Calcium 9.7 8.9 - 10.3 mg/dL   GFR, Estimated >60 >60 mL/min    Comment: (NOTE) Calculated using the CKD-EPI Creatinine  Equation (2021)    Anion gap 13 5 - 15    Comment: Performed at [facility], [address] [place], [place] [address]  CBC     Status: Abnormal   Collection Time: 09/28/20 12:21 PM  Result Value Ref Range   WBC 6.1 4.0 - 10.5 K/uL   RBC 3.55 (L) 3.87 - 5.11 MIL/uL   Hemoglobin 10.7 (L) 12.0 - 15.0 g/dL   HCT 32.3 (L) 36.0 - 46.0 %   MCV 91.0 80.0 - 100.0 fL   MCH 30.1 26.0 - 34.0 pg   MCHC 33.1 30.0 - 36.0 g/dL   RDW 13.2 11.5 - 15.5 %   Platelets 253 150 - 400 K/uL   nRBC 0.0 0.0 - 0.2 %    Comment: Performed at [facility], [address] [place], [place] [address]  Urinalysis, Complete w Microscopic     Status: Abnormal   Collection Time: 09/28/20 12:21 PM  Result Value Ref Range   Color, Urine YELLOW (A) YELLOW   APPearance CLOUDY (A) CLEAR   Specific Gravity, Urine 1.015 1.005 - 1.030   pH 6.0 5.0 - 8.0   Glucose, UA NEGATIVE NEGATIVE mg/dL   Hgb urine dipstick SMALL (A) NEGATIVE   Bilirubin Urine NEGATIVE NEGATIVE   Ketones, ur 5 (A) NEGATIVE mg/dL   Protein, ur 100 (A) NEGATIVE mg/dL   Nitrite NEGATIVE NEGATIVE   Leukocytes,Ua LARGE (A) NEGATIVE   RBC / HPF 6-10 0 - 5 RBC/hpf   WBC, UA >50 (H) 0 - 5 WBC/hpf   Bacteria, UA RARE (A) NONE SEEN   Squamous Epithelial / LPF 0-5 0 - 5   WBC Clumps PRESENT    Mucus PRESENT    Hyaline Casts, UA PRESENT     Comment: Performed at [facility], [address] [place], [place] [address]  Troponin I (High Sensitivity)     Status: Abnormal   Collection Time: 09/28/20 12:21 PM  Result Value Ref Range   Troponin I (High Sensitivity) 24 (H) <18 ng/L    Comment: (NOTE) Elevated high sensitivity troponin I (hsTnI) values and significant  changes across serial measurements may suggest ACS but many other  chronic and acute conditions are known to elevate hsTnI results.  Refer to the "Links" section for chest pain algorithms and additional  guidance. Performed at [facility], [address] [place], [place] [address]   CK     Status: Abnormal   Collection Time: 09/28/20 12:21 PM  Result Value Ref Range   Total CK 29 (L) 38 - 234 U/L    Comment: Performed at [facility], [address], [place], [place] [address]  Resp Panel by  RT-PCR (Flu A&B, Covid) Nasopharyngeal Swab     Status: None   Collection Time: 09/29/20 12:22 AM   Specimen: Nasopharyngeal Swab; Nasopharyngeal(NP) swabs in vial transport medium  Result Value Ref Range   SARS Coronavirus 2 by RT PCR NEGATIVE NEGATIVE    Comment: (NOTE) SARS-CoV-2 target nucleic acids are NOT DETECTED.  The SARS-CoV-2 RNA is generally detectable in upper respiratory specimens during the acute phase of infection. The lowest concentration of SARS-CoV-2 viral copies this assay can detect is 138 copies/mL. A negative result does not preclude SARS-Cov-2 infection and should not be used as the sole basis for treatment or other patient management decisions. A negative result may occur with  improper specimen collection/handling, submission of specimen other than nasopharyngeal swab, presence of viral mutation(s) within the areas targeted by this assay, and inadequate number of viral copies(<138 copies/mL). A negative result must be combined with clinical observations, patient history, and epidemiological information. The expected result is Negative.  Fact Sheet for Patients:  BloggerCourse.com  Fact Sheet for Healthcare Providers:  SeriousBroker.it  This test is no t yet approved or cleared by the [place] FDA and  has been authorized for detection and/or diagnosis of SARS-CoV-2 by FDA under an Emergency Use Authorization (EUA). This EUA will remain  in effect (meaning this test can be used) for the duration of the COVID-19 declaration under Section 564(b)(1) of the Act, 21 U.S.C.section 360bbb-3(b)(1), unless the authorization is terminated  or  revoked sooner.       Influenza A by PCR NEGATIVE NEGATIVE   Influenza B by PCR NEGATIVE NEGATIVE    Comment: (NOTE) The Xpert Xpress SARS-CoV-2/FLU/RSV plus assay is intended as an aid in the diagnosis of influenza from Nasopharyngeal swab specimens and should not be used as a sole basis for treatment. Nasal washings and aspirates are unacceptable for Xpert Xpress SARS-CoV-2/FLU/RSV testing.  Fact Sheet for Patients: BloggerCourse.com  Fact Sheet for Healthcare Providers: SeriousBroker.it  This test is not yet approved or cleared by the [place] FDA and has been authorized for detection and/or diagnosis of SARS-CoV-2 by FDA under an Emergency Use Authorization (EUA). This EUA will remain in effect (meaning this test can be used) for the duration of the COVID-19 declaration under Section 564(b)(1) of the Act, 21 U.S.C. section 360bbb-3(b)(1), unless the authorization is terminated or revoked.  Performed at [facility], [address] [place], [place] [address]   Troponin I (High Sensitivity)     Status: Abnormal   Collection Time: 09/29/20 12:22 AM  Result Value Ref Range   Troponin I (High Sensitivity) 24 (H) <18 ng/L    Comment: (NOTE) Elevated high sensitivity troponin I (hsTnI) values and significant  changes across serial measurements may suggest ACS but many other  chronic and acute conditions are known to elevate hsTnI results.  Refer to the "Links" section for chest pain algorithms and additional  guidance. Performed at [facility], [address] [place], [place] [address]   Basic metabolic panel     Status: None   Collection Time: 09/29/20  4:38 AM  Result Value Ref Range   Sodium 136 135 - 145 mmol/L   Potassium 3.9 3.5 - 5.1 mmol/L    Comment: HEMOLYSIS AT THIS LEVEL MAY AFFECT RESULT   Chloride 100 98 - 111 mmol/L   CO2 25 22 - 32 mmol/L   Glucose, Bld 98 70 - 99 mg/dL     Comment: Glucose reference range applies only to samples taken  after fasting for at least 8 hours.   BUN 18 8 - 23 mg/dL   Creatinine, Ser 3.82 0.44 - 1.00 mg/dL   Calcium 9.4 8.9 - 50.5 mg/dL   GFR, Estimated >39 >76 mL/min    Comment: (NOTE) Calculated using the CKD-EPI Creatinine Equation (2021)    Anion gap 11 5 - 15    Comment: Performed at [facility], [address] [place], [place] [address]  CBC     Status: Abnormal   Collection Time: 09/29/20  4:38 AM  Result Value Ref Range   WBC 7.1 4.0 - 10.5 K/uL   RBC 3.28 (L) 3.87 - 5.11 MIL/uL   Hemoglobin 10.1 (L) 12.0 - 15.0 g/dL   HCT 37.9 (L) 02.4 - 09.7 %   MCV 90.2 80.0 - 100.0 fL   MCH 30.8 26.0 - 34.0 pg   MCHC 34.1 30.0 - 36.0 g/dL   RDW 35.3 29.9 - 24.2 %   Platelets 235 150 - 400 K/uL   nRBC 0.0 0.0 - 0.2 %    Comment: Performed at [facility], [address] [place], [place] [address]  Hemoglobin A1c     Status: None   Collection Time: 09/29/20  4:38 AM  Result Value Ref Range   Hgb A1c MFr Bld 5.4 4.8 - 5.6 %    Comment: (NOTE) Pre diabetes:          5.7%-6.4%  Diabetes:              >6.4%  Glycemic control for   <7.0% adults with diabetes    Mean Plasma Glucose 108.28 mg/dL    Comment: Performed at [facility], [address], [place], [place] [address]  CBG monitoring, ED     Status: None   Collection Time: 09/29/20  4:43 AM  Result Value Ref Range   Glucose-Capillary 88 70 - 99 mg/dL    Comment: Glucose reference range applies only to samples taken after fasting for at least 8 hours.  CBG monitoring, ED     Status: Abnormal   Collection Time: 09/29/20  9:00 AM  Result Value Ref Range   Glucose-Capillary 112 (H) 70 - 99 mg/dL    Comment: Glucose reference range applies only to samples taken after fasting for at least 8 hours.  CBG monitoring, ED     Status: None   Collection Time: 09/29/20  1:38 PM  Result Value Ref Range   Glucose-Capillary 93 70 - 99 mg/dL    Comment:  Glucose reference range applies only to samples taken after fasting for at least 8 hours.   DG Chest 2 View  Result Date: 09/28/2020 CLINICAL DATA:  Dizziness. EXAM: CHEST - 2 VIEW COMPARISON:  October 15, 2019 FINDINGS: Chronic scarring is seen within the lateral aspect of the left lung base. There is no evidence of acute infiltrate, pleural effusion or pneumothorax. The heart size and mediastinal contours are within normal limits. A chronic deformity is seen involving the proximal left humerus. Degenerative changes seen throughout the thoracic spine. IMPRESSION: Chronic left basilar scarring without acute or active cardiopulmonary disease. Electronically Signed   By: Aram Candela M.D.   On: 09/28/2020 23:52   DG Thoracic Spine 2 View  Result Date: 09/28/2020 CLINICAL DATA:  Status post fall. EXAM: THORACIC SPINE 2 VIEWS COMPARISON:  None. FINDINGS: There is no evidence of an acute thoracic spine fracture. There is mild levoscoliosis of the lower thoracic spine. Mild-to-moderate severity multilevel endplate sclerosis  is seen with multilevel intervertebral disc space narrowing. No other significant bone abnormalities are identified. IMPRESSION: 1. Mild-to-moderate severity multilevel degenerative changes without an acute osseous abnormality. Electronically Signed   By: Aram Candelahaddeus  Houston M.D.   On: 09/28/2020 23:53   DG Lumbar Spine Complete  Result Date: 09/28/2020 CLINICAL DATA:  Status post fall. EXAM: LUMBAR SPINE - COMPLETE 4+ VIEW COMPARISON:  None. FINDINGS: There is no evidence of an acute lumbar spine fracture. There is marked severity dextroscoliosis of the lumbar spine. There is moderate to marked severity multilevel endplate sclerosis with moderate to marked severity multilevel intervertebral disc space narrowing. Multiple radiopaque surgical clips and surgical sutures are seen within the pelvis. IMPRESSION: 1. Marked severity dextroscoliosis of the lumbar spine with moderate to marked  severity multilevel degenerative disc disease. Electronically Signed   By: Aram Candelahaddeus  Houston M.D.   On: 09/28/2020 23:54   CT Head Wo Contrast  Result Date: 09/29/2020 CLINICAL DATA:  Fall EXAM: CT HEAD WITHOUT CONTRAST CT CERVICAL SPINE WITHOUT CONTRAST TECHNIQUE: Multidetector CT imaging of the head and cervical spine was performed following the standard protocol without intravenous contrast. Multiplanar CT image reconstructions of the cervical spine were also generated. COMPARISON:  CT head 07/22/2019, 07/17/2019, CT head and cervical spine 07/16/2019 FINDINGS: CT HEAD FINDINGS Brain: No evidence of acute infarction, hemorrhage, hydrocephalus, extra-axial collection, visible mass lesion or mass effect. Symmetric prominence of the ventricles, cisterns and sulci compatible with parenchymal volume loss. Patchy areas of white matter hypoattenuation are most compatible with chronic microvascular angiopathy. Vascular: Atherosclerotic calcification of the carotid siphons and intradural vertebral arteries. No hyperdense vessel. Skull: No calvarial fracture or suspicious osseous lesion. No scalp swelling or hematoma. Sinuses/Orbits: Pneumatized secretions in the right maxillary and sphenoid sinuses. Additional mild pansinus mural disease and small bilateral mastoid effusions. Orbital structures are unremarkable aside from prior lens extractions. Other: None. CT CERVICAL SPINE FINDINGS Alignment: Stabilization collar is absent at the time of examination. There is mild rightward lateral cervical flexion and slight rightward cranial rotation. Unchanged degenerative anterolisthesis C3-C5 retrolisthesis C5 on 6. No evidence of traumatic listhesis. No abnormally widened, perched or jumped facets. Normal alignment of the craniocervical articulations. Arthrosis and narrowing of the atlantodental interval but with otherwise normal alignment of the atlantoaxial articulations. Skull base and vertebrae: No acute skull base  fracture. No vertebral body fracture or height loss. Normal bone mineralization. No worrisome osseous lesions. Moderate arthrosis at the atlantodental and basion dens interval with some callus formation. Appearance is similar to prior. Multilevel cervical spondylitic changes are described below. Soft tissues and spinal canal: No pre or paravertebral fluid or swelling. No visible canal hematoma. Cervical carotid atherosclerosis at the level of the bifurcations. Airways are patent. Disc levels: Multilevel cervical spondylitic changes are present, maximal C5-C7. Slightly larger disc osteophyte complexes present C4-5 and C6-7 partially efface the ventral thecal sac with spurring, retrolisthesis and some ligamentum flavum infolding at C5-6 resulting in mild canal stenosis. Multilevel uncinate spurring facet hypertrophic changes are present resulting in mild-to-moderate multilevel neural foraminal narrowing with more moderate to severe narrowing bilaterally C5-6 as well. Upper chest: No acute abnormality in the upper chest or imaged lung apices. Other: Normal thyroid. IMPRESSION: 1. No acute intracranial abnormality. No acute scalp swelling or calvarial fracture. 2. Stable parenchymal volume loss and chronic microvascular ischemic white matter disease. 3. Pneumatized secretions in the right maxillary and sphenoid sinuses. Additional mild pansinus mural disease and small bilateral mastoid effusions. Correlate for features of acute sinusitis. 4. No  acute fracture or traumatic listhesis of the cervical spine. 5. Multilevel cervical spondylitic and facet degenerative changes, maximal C5-C7, as above. 6. Cervical and intracranial atherosclerosis. Electronically Signed   By: Lovena Le M.D.   On: 09/29/2020 00:20   CT Cervical Spine Wo Contrast  Result Date: 09/29/2020 CLINICAL DATA:  Fall EXAM: CT HEAD WITHOUT CONTRAST CT CERVICAL SPINE WITHOUT CONTRAST TECHNIQUE: Multidetector CT imaging of the head and cervical spine  was performed following the standard protocol without intravenous contrast. Multiplanar CT image reconstructions of the cervical spine were also generated. COMPARISON:  CT head 07/22/2019, 07/17/2019, CT head and cervical spine 07/16/2019 FINDINGS: CT HEAD FINDINGS Brain: No evidence of acute infarction, hemorrhage, hydrocephalus, extra-axial collection, visible mass lesion or mass effect. Symmetric prominence of the ventricles, cisterns and sulci compatible with parenchymal volume loss. Patchy areas of white matter hypoattenuation are most compatible with chronic microvascular angiopathy. Vascular: Atherosclerotic calcification of the carotid siphons and intradural vertebral arteries. No hyperdense vessel. Skull: No calvarial fracture or suspicious osseous lesion. No scalp swelling or hematoma. Sinuses/Orbits: Pneumatized secretions in the right maxillary and sphenoid sinuses. Additional mild pansinus mural disease and small bilateral mastoid effusions. Orbital structures are unremarkable aside from prior lens extractions. Other: None. CT CERVICAL SPINE FINDINGS Alignment: Stabilization collar is absent at the time of examination. There is mild rightward lateral cervical flexion and slight rightward cranial rotation. Unchanged degenerative anterolisthesis C3-C5 retrolisthesis C5 on 6. No evidence of traumatic listhesis. No abnormally widened, perched or jumped facets. Normal alignment of the craniocervical articulations. Arthrosis and narrowing of the atlantodental interval but with otherwise normal alignment of the atlantoaxial articulations. Skull base and vertebrae: No acute skull base fracture. No vertebral body fracture or height loss. Normal bone mineralization. No worrisome osseous lesions. Moderate arthrosis at the atlantodental and basion dens interval with some callus formation. Appearance is similar to prior. Multilevel cervical spondylitic changes are described below. Soft tissues and spinal canal: No  pre or paravertebral fluid or swelling. No visible canal hematoma. Cervical carotid atherosclerosis at the level of the bifurcations. Airways are patent. Disc levels: Multilevel cervical spondylitic changes are present, maximal C5-C7. Slightly larger disc osteophyte complexes present C4-5 and C6-7 partially efface the ventral thecal sac with spurring, retrolisthesis and some ligamentum flavum infolding at C5-6 resulting in mild canal stenosis. Multilevel uncinate spurring facet hypertrophic changes are present resulting in mild-to-moderate multilevel neural foraminal narrowing with more moderate to severe narrowing bilaterally C5-6 as well. Upper chest: No acute abnormality in the upper chest or imaged lung apices. Other: Normal thyroid. IMPRESSION: 1. No acute intracranial abnormality. No acute scalp swelling or calvarial fracture. 2. Stable parenchymal volume loss and chronic microvascular ischemic white matter disease. 3. Pneumatized secretions in the right maxillary and sphenoid sinuses. Additional mild pansinus mural disease and small bilateral mastoid effusions. Correlate for features of acute sinusitis. 4. No acute fracture or traumatic listhesis of the cervical spine. 5. Multilevel cervical spondylitic and facet degenerative changes, maximal C5-C7, as above. 6. Cervical and intracranial atherosclerosis. Electronically Signed   By: Lovena Le M.D.   On: 09/29/2020 00:20   CT ABDOMEN PELVIS W CONTRAST  Result Date: 09/29/2020 CLINICAL DATA:  Status post fall and dizziness. EXAM: CT ABDOMEN AND PELVIS WITH CONTRAST TECHNIQUE: Multidetector CT imaging of the abdomen and pelvis was performed using the standard protocol following bolus administration of intravenous contrast. CONTRAST:  136mL OMNIPAQUE IOHEXOL 300 MG/ML  SOLN COMPARISON:  None. FINDINGS: Lower chest: No acute abnormality. Hepatobiliary: No focal liver  abnormality is seen. No gallstones, gallbladder wall thickening, or biliary dilatation.  Pancreas: Unremarkable. No pancreatic ductal dilatation or surrounding inflammatory changes. Spleen: Normal in size without focal abnormality. Adrenals/Urinary Tract: Adrenal glands are unremarkable. Kidneys are normal, without renal calculi, focal lesion, or hydronephrosis. Bladder is unremarkable. Stomach/Bowel: There is a small hiatal hernia. The appendix is surgically absent. No evidence of bowel wall thickening, distention, or inflammatory changes. Noninflamed diverticula are seen throughout the large bowel. Vascular/Lymphatic: Moderate severity aortic calcification and tortuosity. No enlarged abdominal or pelvic lymph nodes. Reproductive: Status post hysterectomy. No adnexal masses. Other: No abdominal wall hernia or abnormality. Multiple surgical clips are seen within the mid and lower left abdomen and posterior aspect of the mid to lower pelvis. No abdominopelvic ascites. Musculoskeletal: There is marked severity dextroscoliosis of the lumbar spine with marked severity multilevel degenerative changes. IMPRESSION: 1. Small hiatal hernia. 2. Colonic diverticulosis. 3. Marked severity dextroscoliosis of the lumbar spine with marked severity multilevel degenerative changes. 4. Aortic atherosclerosis. Aortic Atherosclerosis (ICD10-I70.0). Electronically Signed   By: Aram Candela M.D.   On: 09/29/2020 01:34    Pending Labs Unresulted Labs (From admission, onward)         None      Vitals/Pain Today's Vitals   09/29/20 0807 09/29/20 0951 09/29/20 1129 09/29/20 1458  BP:   (!) 144/83 134/81  Pulse:   71 79  Resp:   18 18  Temp:    97.8 F (36.6 C)  TempSrc:    Oral  SpO2:   98% 98%  Weight:  59 kg    Height:   (1.549 m)    PainSc: 0-No pain   0-No pain    Isolation Precautions No active isolations  Medications Medications  cefTRIAXone (ROCEPHIN) 1 g in sodium chloride 0.9 % 100 mL IVPB (has no administration in time range)  apixaban (ELIQUIS) tablet 5 mg (5 mg Oral Given  09/29/20 0909)  carbidopa-levodopa (SINEMET IR) 25-100 MG per tablet immediate release 2 tablet (2 tablets Oral Given 09/29/20 1339)  sodium chloride flush (NS) 0.9 % injection 3 mL (3 mLs Intravenous Given 09/29/20 0905)  acetaminophen (TYLENOL) tablet 650 mg (has no administration in time range)    Or  acetaminophen (TYLENOL) suppository 650 mg (has no administration in time range)  ondansetron (ZOFRAN) tablet 4 mg (has no administration in time range)    Or  ondansetron (ZOFRAN) injection 4 mg (has no administration in time range)  insulin aspart (novoLOG) injection 0-9 Units (0 Units Subcutaneous Not Given 09/29/20 1339)  insulin aspart (novoLOG) injection 0-5 Units (has no administration in time range)  carbidopa-levodopa (SINEMET IR) 25-100 MG per tablet immediate release 1 tablet (has no administration in time range)  allopurinol (ZYLOPRIM) tablet 300 mg (300 mg Oral Given 09/29/20 1048)  aspirin chewable tablet 81 mg ( Oral Canceled Entry 09/29/20 0931)  nitroGLYCERIN (NITROSTAT) SL tablet 0.4 mg (has no administration in time range)  simvastatin (ZOCOR) tablet 10 mg (10 mg Oral Given 09/29/20 0910)  donepezil (ARICEPT) tablet 10 mg (has no administration in time range)  amLODipine (NORVASC) tablet 5 mg (5 mg Oral Given 09/29/20 0909)  iohexol (OMNIPAQUE) 300 MG/ML solution 100 mL (100 mLs Intravenous Contrast Given 09/29/20 0057)  cefTRIAXone (ROCEPHIN) 1 g in sodium chloride 0.9 % 100 mL IVPB (0 g Intravenous Stopped 09/29/20 0306)  potassium chloride 10 mEq in 100 mL IVPB (0 mEq Intravenous Stopped 09/29/20 0650)  diltiazem (CARDIZEM) injection 10 mg ( Intravenous Not Given 09/29/20  4098)    Mobility walks with person assist High fall risk   Focused Assessments Neuro Assessment Handoff:  Swallow screen pass? Yes  Cardiac Rhythm: Atrial fibrillation       Neuro Assessment: Within Defined Limits Neuro Checks:      Last Documented NIHSS Modified Score:   Has TPA been  given? No If patient is a Neuro Trauma and patient is going to OR before floor call report to 4N Charge nurse: [phone] or [phone]     R Recommendations: See Admitting Provider Note  Report given to:   Additional Notes:

## 2020-09-29 NOTE — Progress Notes (Signed)
PROGRESS NOTE    Melinda Maxwell  [id number] DOB: 1941-01-01 DOA: 09/28/2020 PCP: [name], MD   Chief complaint.  General weakness and fall. Brief Narrative:  Melinda Maxwell is a 79 y.o. female with medical history significant for CAD, diastolic CHF, paroxysmal A. fib on Xarelto, Parkinson's disease, chronic dizziness and physical deconditioning, HTN, who was brought to the hospital after 2 episodes of syncope at home with fall.  She previously had a similar episodes last year.  She was also found to have UTI, started on Rocephin.  Assessment & Plan:   Active Problems:   UTI (urinary tract infection)   CAD (coronary artery disease)   Benign essential hypertension   Paroxysmal atrial fibrillation (HCC)   Chronic diastolic CHF (congestive heart failure) (HCC)   Syncope   Hypokalemia   Type 2 diabetes mellitus without complication (HCC)   Chronic anticoagulation   Parkinson disease ([place])  #1.  Syncope and fall. Generalized weakness. Patient had a similar episode last year at that time she had a nonsustained ventricular tachycardia.  Her ejection fraction was normal. Patient currently lives alone, she has not had a shower for the last 36-month.  She has not been eating well.  Physical therapy had seen the patient, recommend SNF placement.  Social work also consulted. Patient may also has some dehydration from poor p.o. intake, appetite is better after arriving the hospital. Continue telemetry monitoring. Check orthostatic vital signs.  #2.  UTI. Continue Rocephin.  Pending urine culture results.  3.  Essential hypertension. Blood pressure running high.  Start Norvasc.  4.  Paroxysmal atrial fibrillation.  With rapid ventricular response. Continue anticoagulation. Heart rate is better.  5.  Chronic diastolic congestive heart failure. Stable.  6.  Type 2 diabetes without complication. Not currently treated.  7.  Parkinson disease.     DVT prophylaxis:  eliquis Code Status: Full Family Communication: Seen patient with the presence of caretaker, who is not biologically related Disposition Plan:  .   Status is: Inpatient  Remains inpatient appropriate because:Inpatient level of care appropriate due to severity of illness   Dispo: The patient is from: Home              Anticipated d/c is to: SNF              Anticipated d/c date is: 2 days              Patient currently is not medically stable to d/c.        No intake/output data recorded. No intake/output data recorded.     Consultants:   Cardiology  Procedures: None  Antimicrobials: Rocephin  Subjective: Patient feels better today, appetite improving.  No nausea vomiting. Denies any short of breath or cough. Denies any back pain, no dysuria hematuria.  Objective: Vitals:   09/29/20 0804 09/29/20 0951 09/29/20 1129 09/29/20 1458  BP: (!) 216/100  (!) 144/83 134/81  Pulse: 79  71 79  Resp: 16  18 18   Temp:    97.8 F (36.6 C)  TempSrc:    Oral  SpO2: 96%  98% 98%  Weight:  59 kg    Height:  5\' 1"  (1.549 m)     No intake or output data in the 24 hours ending 09/29/20 1553 Filed Weights   09/28/20 1215 09/29/20 0951  Weight: 59 kg 59 kg    Examination:  General exam: Appears calm and comfortable  Respiratory system: Clear to auscultation. Respiratory effort  normal. Cardiovascular system: Irregular.  No JVD, murmurs, rubs, gallops or clicks. No pedal edema. Gastrointestinal system: Abdomen is nondistended, soft and nontender. No organomegaly or masses felt. Normal bowel sounds heard. Central nervous system: Alert and oriented x3. No focal neurological deficits. Extremities: Symmetric 5 x 5 power. Skin: No rashes, lesions or ulcers Psychiatry: Mood & affect appropriate.     Data Reviewed: I have personally reviewed following labs and imaging studies  CBC: Recent Labs  Lab 09/28/20 1221 09/29/20 0438  WBC 6.1 7.1  HGB 10.7* 10.1*  HCT 32.3*  29.6*  MCV 91.0 90.2  PLT 253 [id number]   Basic Metabolic Panel: Recent Labs  Lab 09/28/20 1221 09/29/20 0438  NA 135 136  K 2.8* 3.9  CL 96* 100  CO2 26 25  GLUCOSE 103* 98  BUN 17 18  CREATININE 0.94 0.94  CALCIUM 9.7 9.4   GFR: Estimated Creatinine Clearance: 38.8 mL/min (by C-G formula based on SCr of 0.94 mg/dL). Liver Function Tests: No results for input(s): AST, ALT, ALKPHOS, BILITOT, PROT, ALBUMIN in the last 168 hours. No results for input(s): LIPASE, AMYLASE in the last 168 hours. No results for input(s): AMMONIA in the last 168 hours. Coagulation Profile: No results for input(s): INR, PROTIME in the last 168 hours. Cardiac Enzymes: Recent Labs  Lab 09/28/20 1221  CKTOTAL 29*   BNP (last 3 results) No results for input(s): PROBNP in the last 8760 hours. HbA1C: Recent Labs    09/29/20 0438  HGBA1C 5.4   CBG: Recent Labs  Lab 09/29/20 0443 09/29/20 0900 09/29/20 1338  GLUCAP 88 112* 93   Lipid Profile: No results for input(s): CHOL, HDL, LDLCALC, TRIG, CHOLHDL, LDLDIRECT in the last 72 hours. Thyroid Function Tests: No results for input(s): TSH, T4TOTAL, FREET4, T3FREE, THYROIDAB in the last 72 hours. Anemia Panel: No results for input(s): VITAMINB12, FOLATE, FERRITIN, TIBC, IRON, RETICCTPCT in the last 72 hours. Sepsis Labs: No results for input(s): PROCALCITON, LATICACIDVEN in the last 168 hours.  Recent Results (from the past 240 hour(s))  Resp Panel by RT-PCR (Flu A&B, Covid) Nasopharyngeal Swab     Status: None   Collection Time: 09/29/20 12:22 AM   Specimen: Nasopharyngeal Swab; Nasopharyngeal(NP) swabs in vial transport medium  Result Value Ref Range Status   SARS Coronavirus 2 by RT PCR NEGATIVE NEGATIVE Final    Comment: (NOTE) SARS-CoV-2 target nucleic acids are NOT DETECTED.  The SARS-CoV-2 RNA is generally detectable in upper respiratory specimens during the acute phase of infection. The lowest concentration of SARS-CoV-2 viral copies  this assay can detect is 138 copies/mL. A negative result does not preclude SARS-Cov-2 infection and should not be used as the sole basis for treatment or other patient management decisions. A negative result may occur with  improper specimen collection/handling, submission of specimen other than nasopharyngeal swab, presence of viral mutation(s) within the areas targeted by this assay, and inadequate number of viral copies(<138 copies/mL). A negative result must be combined with clinical observations, patient history, and epidemiological information. The expected result is Negative.  Fact Sheet for Patients:  EntrepreneurPulse.com.au  Fact Sheet for Healthcare Providers:  IncredibleEmployment.be  This test is no t yet approved or cleared by the [place] FDA and  has been authorized for detection and/or diagnosis of SARS-CoV-2 by FDA under an Emergency Use Authorization (EUA). This EUA will remain  in effect (meaning this test can be used) for the duration of the COVID-19 declaration under Section 564(b)(1) of the Act, 21 U.S.C.section  360bbb-3(b)(1), unless the authorization is terminated  or revoked sooner.       Influenza A by PCR NEGATIVE NEGATIVE Final   Influenza B by PCR NEGATIVE NEGATIVE Final    Comment: (NOTE) The Xpert Xpress SARS-CoV-2/FLU/RSV plus assay is intended as an aid in the diagnosis of influenza from Nasopharyngeal swab specimens and should not be used as a sole basis for treatment. Nasal washings and aspirates are unacceptable for Xpert Xpress SARS-CoV-2/FLU/RSV testing.  Fact Sheet for Patients: EntrepreneurPulse.com.au  Fact Sheet for Healthcare Providers: IncredibleEmployment.be  This test is not yet approved or cleared by the [place] FDA and has been authorized for detection and/or diagnosis of SARS-CoV-2 by FDA under an Emergency Use Authorization (EUA). This EUA  will remain in effect (meaning this test can be used) for the duration of the COVID-19 declaration under Section 564(b)(1) of the Act, 21 U.S.C. section 360bbb-3(b)(1), unless the authorization is terminated or revoked.  Performed at [facility], [address], [place], [place] [address]          Radiology Studies: DG Chest 2 View  Result Date: 09/28/2020 CLINICAL DATA:  Dizziness. EXAM: CHEST - 2 VIEW COMPARISON:  October 15, 2019 FINDINGS: Chronic scarring is seen within the lateral aspect of the left lung base. There is no evidence of acute infiltrate, pleural effusion or pneumothorax. The heart size and mediastinal contours are within normal limits. A chronic deformity is seen involving the proximal left humerus. Degenerative changes seen throughout the thoracic spine. IMPRESSION: Chronic left basilar scarring without acute or active cardiopulmonary disease. Electronically Signed   By: Virgina Norfolk M.D.   On: 09/28/2020 23:52   DG Thoracic Spine 2 View  Result Date: 09/28/2020 CLINICAL DATA:  Status post fall. EXAM: THORACIC SPINE 2 VIEWS COMPARISON:  None. FINDINGS: There is no evidence of an acute thoracic spine fracture. There is mild levoscoliosis of the lower thoracic spine. Mild-to-moderate severity multilevel endplate sclerosis is seen with multilevel intervertebral disc space narrowing. No other significant bone abnormalities are identified. IMPRESSION: 1. Mild-to-moderate severity multilevel degenerative changes without an acute osseous abnormality. Electronically Signed   By: Virgina Norfolk M.D.   On: 09/28/2020 23:53   DG Lumbar Spine Complete  Result Date: 09/28/2020 CLINICAL DATA:  Status post fall. EXAM: LUMBAR SPINE - COMPLETE 4+ VIEW COMPARISON:  None. FINDINGS: There is no evidence of an acute lumbar spine fracture. There is marked severity dextroscoliosis of the lumbar spine. There is moderate to marked severity multilevel endplate sclerosis with  moderate to marked severity multilevel intervertebral disc space narrowing. Multiple radiopaque surgical clips and surgical sutures are seen within the pelvis. IMPRESSION: 1. Marked severity dextroscoliosis of the lumbar spine with moderate to marked severity multilevel degenerative disc disease. Electronically Signed   By: Virgina Norfolk M.D.   On: 09/28/2020 23:54   CT Head Wo Contrast  Result Date: 09/29/2020 CLINICAL DATA:  Fall EXAM: CT HEAD WITHOUT CONTRAST CT CERVICAL SPINE WITHOUT CONTRAST TECHNIQUE: Multidetector CT imaging of the head and cervical spine was performed following the standard protocol without intravenous contrast. Multiplanar CT image reconstructions of the cervical spine were also generated. COMPARISON:  CT head 07/22/2019, 07/17/2019, CT head and cervical spine 07/16/2019 FINDINGS: CT HEAD FINDINGS Brain: No evidence of acute infarction, hemorrhage, hydrocephalus, extra-axial collection, visible mass lesion or mass effect. Symmetric prominence of the ventricles, cisterns and sulci compatible with parenchymal volume loss. Patchy areas of white matter hypoattenuation are most compatible with chronic microvascular angiopathy. Vascular: Atherosclerotic calcification  of the carotid siphons and intradural vertebral arteries. No hyperdense vessel. Skull: No calvarial fracture or suspicious osseous lesion. No scalp swelling or hematoma. Sinuses/Orbits: Pneumatized secretions in the right maxillary and sphenoid sinuses. Additional mild pansinus mural disease and small bilateral mastoid effusions. Orbital structures are unremarkable aside from prior lens extractions. Other: None. CT CERVICAL SPINE FINDINGS Alignment: Stabilization collar is absent at the time of examination. There is mild rightward lateral cervical flexion and slight rightward cranial rotation. Unchanged degenerative anterolisthesis C3-C5 retrolisthesis C5 on 6. No evidence of traumatic listhesis. No abnormally widened,  perched or jumped facets. Normal alignment of the craniocervical articulations. Arthrosis and narrowing of the atlantodental interval but with otherwise normal alignment of the atlantoaxial articulations. Skull base and vertebrae: No acute skull base fracture. No vertebral body fracture or height loss. Normal bone mineralization. No worrisome osseous lesions. Moderate arthrosis at the atlantodental and basion dens interval with some callus formation. Appearance is similar to prior. Multilevel cervical spondylitic changes are described below. Soft tissues and spinal canal: No pre or paravertebral fluid or swelling. No visible canal hematoma. Cervical carotid atherosclerosis at the level of the bifurcations. Airways are patent. Disc levels: Multilevel cervical spondylitic changes are present, maximal C5-C7. Slightly larger disc osteophyte complexes present C4-5 and C6-7 partially efface the ventral thecal sac with spurring, retrolisthesis and some ligamentum flavum infolding at C5-6 resulting in mild canal stenosis. Multilevel uncinate spurring facet hypertrophic changes are present resulting in mild-to-moderate multilevel neural foraminal narrowing with more moderate to severe narrowing bilaterally C5-6 as well. Upper chest: No acute abnormality in the upper chest or imaged lung apices. Other: Normal thyroid. IMPRESSION: 1. No acute intracranial abnormality. No acute scalp swelling or calvarial fracture. 2. Stable parenchymal volume loss and chronic microvascular ischemic white matter disease. 3. Pneumatized secretions in the right maxillary and sphenoid sinuses. Additional mild pansinus mural disease and small bilateral mastoid effusions. Correlate for features of acute sinusitis. 4. No acute fracture or traumatic listhesis of the cervical spine. 5. Multilevel cervical spondylitic and facet degenerative changes, maximal C5-C7, as above. 6. Cervical and intracranial atherosclerosis. Electronically Signed   By: Lovena Le M.D.   On: 09/29/2020 00:20   CT Cervical Spine Wo Contrast  Result Date: 09/29/2020 CLINICAL DATA:  Fall EXAM: CT HEAD WITHOUT CONTRAST CT CERVICAL SPINE WITHOUT CONTRAST TECHNIQUE: Multidetector CT imaging of the head and cervical spine was performed following the standard protocol without intravenous contrast. Multiplanar CT image reconstructions of the cervical spine were also generated. COMPARISON:  CT head 07/22/2019, 07/17/2019, CT head and cervical spine 07/16/2019 FINDINGS: CT HEAD FINDINGS Brain: No evidence of acute infarction, hemorrhage, hydrocephalus, extra-axial collection, visible mass lesion or mass effect. Symmetric prominence of the ventricles, cisterns and sulci compatible with parenchymal volume loss. Patchy areas of white matter hypoattenuation are most compatible with chronic microvascular angiopathy. Vascular: Atherosclerotic calcification of the carotid siphons and intradural vertebral arteries. No hyperdense vessel. Skull: No calvarial fracture or suspicious osseous lesion. No scalp swelling or hematoma. Sinuses/Orbits: Pneumatized secretions in the right maxillary and sphenoid sinuses. Additional mild pansinus mural disease and small bilateral mastoid effusions. Orbital structures are unremarkable aside from prior lens extractions. Other: None. CT CERVICAL SPINE FINDINGS Alignment: Stabilization collar is absent at the time of examination. There is mild rightward lateral cervical flexion and slight rightward cranial rotation. Unchanged degenerative anterolisthesis C3-C5 retrolisthesis C5 on 6. No evidence of traumatic listhesis. No abnormally widened, perched or jumped facets. Normal alignment of the craniocervical articulations. Arthrosis and  narrowing of the atlantodental interval but with otherwise normal alignment of the atlantoaxial articulations. Skull base and vertebrae: No acute skull base fracture. No vertebral body fracture or height loss. Normal bone mineralization.  No worrisome osseous lesions. Moderate arthrosis at the atlantodental and basion dens interval with some callus formation. Appearance is similar to prior. Multilevel cervical spondylitic changes are described below. Soft tissues and spinal canal: No pre or paravertebral fluid or swelling. No visible canal hematoma. Cervical carotid atherosclerosis at the level of the bifurcations. Airways are patent. Disc levels: Multilevel cervical spondylitic changes are present, maximal C5-C7. Slightly larger disc osteophyte complexes present C4-5 and C6-7 partially efface the ventral thecal sac with spurring, retrolisthesis and some ligamentum flavum infolding at C5-6 resulting in mild canal stenosis. Multilevel uncinate spurring facet hypertrophic changes are present resulting in mild-to-moderate multilevel neural foraminal narrowing with more moderate to severe narrowing bilaterally C5-6 as well. Upper chest: No acute abnormality in the upper chest or imaged lung apices. Other: Normal thyroid. IMPRESSION: 1. No acute intracranial abnormality. No acute scalp swelling or calvarial fracture. 2. Stable parenchymal volume loss and chronic microvascular ischemic white matter disease. 3. Pneumatized secretions in the right maxillary and sphenoid sinuses. Additional mild pansinus mural disease and small bilateral mastoid effusions. Correlate for features of acute sinusitis. 4. No acute fracture or traumatic listhesis of the cervical spine. 5. Multilevel cervical spondylitic and facet degenerative changes, maximal C5-C7, as above. 6. Cervical and intracranial atherosclerosis. Electronically Signed   By: Lovena Le M.D.   On: 09/29/2020 00:20   CT ABDOMEN PELVIS W CONTRAST  Result Date: 09/29/2020 CLINICAL DATA:  Status post fall and dizziness. EXAM: CT ABDOMEN AND PELVIS WITH CONTRAST TECHNIQUE: Multidetector CT imaging of the abdomen and pelvis was performed using the standard protocol following bolus administration of  intravenous contrast. CONTRAST:  170mL OMNIPAQUE IOHEXOL 300 MG/ML  SOLN COMPARISON:  None. FINDINGS: Lower chest: No acute abnormality. Hepatobiliary: No focal liver abnormality is seen. No gallstones, gallbladder wall thickening, or biliary dilatation. Pancreas: Unremarkable. No pancreatic ductal dilatation or surrounding inflammatory changes. Spleen: Normal in size without focal abnormality. Adrenals/Urinary Tract: Adrenal glands are unremarkable. Kidneys are normal, without renal calculi, focal lesion, or hydronephrosis. Bladder is unremarkable. Stomach/Bowel: There is a small hiatal hernia. The appendix is surgically absent. No evidence of bowel wall thickening, distention, or inflammatory changes. Noninflamed diverticula are seen throughout the large bowel. Vascular/Lymphatic: Moderate severity aortic calcification and tortuosity. No enlarged abdominal or pelvic lymph nodes. Reproductive: Status post hysterectomy. No adnexal masses. Other: No abdominal wall hernia or abnormality. Multiple surgical clips are seen within the mid and lower left abdomen and posterior aspect of the mid to lower pelvis. No abdominopelvic ascites. Musculoskeletal: There is marked severity dextroscoliosis of the lumbar spine with marked severity multilevel degenerative changes. IMPRESSION: 1. Small hiatal hernia. 2. Colonic diverticulosis. 3. Marked severity dextroscoliosis of the lumbar spine with marked severity multilevel degenerative changes. 4. Aortic atherosclerosis. Aortic Atherosclerosis (ICD10-I70.0). Electronically Signed   By: Virgina Norfolk M.D.   On: 09/29/2020 01:34        Scheduled Meds: . allopurinol  300 mg Oral Daily  . amLODipine  5 mg Oral Daily  . apixaban  5 mg Oral BID  . aspirin  81 mg Oral Daily  . carbidopa-levodopa  1 tablet Oral QHS  . carbidopa-levodopa  2 tablet Oral TID with meals  . donepezil  10 mg Oral QHS  . insulin aspart  0-5 Units Subcutaneous QHS  . insulin  aspart  0-9 Units  Subcutaneous TID WC  . nystatin   Topical TID  . simvastatin  10 mg Oral Daily  . sodium chloride flush  3 mL Intravenous Q12H   Continuous Infusions: . cefTRIAXone (ROCEPHIN)  IV       LOS: 0 days    Time spent: 27 minutes    Sharen Hones, MD [facility]   To contact the attending provider between 7A-7P or the covering provider during after hours 7P-7A, please log into the web site [email or web address] and access using universal [place] password for that web site. If you do not have the password, please call the hospital operator.  09/29/2020, 3:53 PM

## 2020-09-29 NOTE — Consult Note (Signed)
[facility] Consultation Note  [name], MRN: [id number], DOB/AGE: August 12, 1941 79 y.o. Admit date: 09/28/2020   Date of Consult: 09/29/2020 Primary Physician: [name], MD Primary Cardiologist: Nehemiah Massed  Chief Complaint:  Chief Complaint  Patient presents with  . Fall  . Dizziness   Reason for Consult: Apparent syncope  HPI: 79 y.o. female with known coronary artery disease diastolic dysfunction heart failure hypertension hyperlipidemia chronic nonvalvular atrial fibrillation who has had recurrent episodes of syncope.  The syncope episodes do appear that they may be related to some weakness fatigue and unsteadiness as well as multiple concerns for possible rhythm disturbances.  She has had monitors that have shown atrial fibrillation but no current evidence of sick sinus syndrome or progressive heart block.  Additionally the patient has had multiple periods of urinary tract infection hypokalemia and other possible causes.  Placement of a loop monitor has been contemplated but has not occurred.  This time the patient again has had some unsteadiness and fall for which she is unclear as to the primary etiology.  Currently she has an EKG showing atrial fibrillation with variable heart rate and nonspecific ST changes.  Recent echocardiogram has shown normal LV systolic function with ejection fraction of 60% chest x-ray shows bibasilar scarring but no evidence of heart failure.  Hemoglobin is 10.7, troponin is 24.  Currently the patient feels relatively well with no evidence of dizziness or other symptoms at this time.  There appears not to be any anginal symptoms or acute coronary syndrome  Past Medical History:  Diagnosis Date  . Arthritis   . Asthma without status asthmaticus    unspecified  . Breast cancer ([place])   . Coronary artery disease   . Diverticulosis   . DVT (deep venous thrombosis) ([place])   . Edema   . Gout   . Hyperlipidemia   .  Hypertension   . MI, old   . Myocardial infarct ([place]) 2004  . Parkinson's disease ([place])   . Sleep apnea       Surgical History:  Past Surgical History:  Procedure Laterality Date  . ABDOMINAL HYSTERECTOMY    . APPENDECTOMY    . BREAST LUMPECTOMY     L breast  . CATARACT EXTRACTION    . COLONOSCOPY     08/27/1989, 01/03/1998, 06/09/2004  . CORONARY STENT INTERVENTION N/A 02/11/2018   Procedure: CORONARY STENT INTERVENTION;  Surgeon: Nelva Bush, MD;  Location: [facility];  Service: Cardiovascular;  Laterality: N/A;  . ESOPHAGOGASTRODUODENOSCOPY  06/09/2004  . FLEXIBLE SIGMOIDOSCOPY    . HEMICOLECTOMY    . LEFT HEART CATH AND CORONARY ANGIOGRAPHY Left 02/11/2018   Procedure: LEFT HEART CATH AND CORONARY ANGIOGRAPHY;  Surgeon: Corey Skains, MD;  Location: [facility];  Service: Cardiovascular;  Laterality: Left;  . ORIF ANKLE FRACTURE  2004     Home Meds: Prior to Admission medications   Medication Sig Start Date End Date Taking? Authorizing Provider  allopurinol (ZYLOPRIM) 300 MG tablet Take 300 mg by mouth daily.   Yes [provider]  apixaban (ELIQUIS) 5 MG TABS tablet Take 1 tablet (5 mg total) by mouth 2 (two) times daily. 07/25/19  Yes Kayleen Memos, DO  aspirin 81 MG chewable tablet Chew 1 tablet (81 mg total) by mouth daily. 07/25/19  Yes Hall, Carole N, DO  carbidopa-levodopa (SINEMET IR) 25-100 MG tablet Take 1-2 tablets by mouth 4 (four) times daily. Take 2 tablets at breakfast,  lunch and dinner Take 1  tablet at bedtime   Yes [provider]  Cholecalciferol (VITAMIN D3) 50 MCG (2000 UT) capsule Take by mouth. 11/12/18  Yes [provider]  colchicine 0.6 MG tablet Take by mouth. 10/06/18  Yes [provider]  diltiazem (CARDIZEM CD) 180 MG 24 hr capsule Take 180 mg by mouth daily.   Yes [provider]  donepezil (ARICEPT) 10 MG tablet Take 10 mg by mouth at bedtime.   Yes [provider]   hydrALAZINE (APRESOLINE) 10 MG tablet Take 1 tablet (10 mg total) by mouth every 8 (eight) hours. 07/25/19 08/24/19 Yes Hall, Carole N, DO  mometasone (ELOCON) 0.1 % lotion Apply 1 application topically at bedtime as needed.   Yes [provider]  pantoprazole (PROTONIX) 40 MG tablet Take 40 mg by mouth daily.  12/31/17  Yes [provider]  simvastatin (ZOCOR) 10 MG tablet Take 10 mg by mouth daily. HLD- reduced d/t interaction with Diltiazem- increased r/f Rhabdomyelosis   Yes [provider]  nitroGLYCERIN (NITROSTAT) 0.4 MG SL tablet Place 0.4 mg under the tongue every 5 (five) minutes as needed for chest pain.  12/31/17   [provider]    Inpatient Medications:  . allopurinol  300 mg Oral Daily  . amLODipine  5 mg Oral Daily  . apixaban  5 mg Oral BID  . aspirin  81 mg Oral Daily  . carbidopa-levodopa  1 tablet Oral QHS  . carbidopa-levodopa  2 tablet Oral TID with meals  . donepezil  10 mg Oral QHS  . insulin aspart  0-5 Units Subcutaneous QHS  . insulin aspart  0-9 Units Subcutaneous TID WC  . simvastatin  10 mg Oral Daily  . sodium chloride flush  3 mL Intravenous Q12H   . cefTRIAXone (ROCEPHIN)  IV      Allergies:  Allergies  Allergen Reactions  . Benzocaine Other (See Comments)    Unknown  . Lisinopril Cough  . Neomycin Other (See Comments)    On allergy testing  . Sulfamethoxazole-Trimethoprim Other (See Comments)    Unknown  . Sulfa Antibiotics Rash    Social History   Socioeconomic History  . Marital status: Divorced    Spouse name: Not on file  . Number of children: 1  . Years of education: 29  . Highest education level: High school graduate  Occupational History  . Not on file  Tobacco Use  . Smoking status: Never Smoker  . Smokeless tobacco: Never Used  Vaping Use  . Vaping Use: Never used  Substance and Sexual Activity  . Alcohol use: No  . Drug use: Never  . Sexual activity: Not on file  Other Topics  Concern  . Not on file  Social History Narrative  . Not on file   Social Determinants of Health   Financial Resource Strain: Not on file  Food Insecurity: Not on file  Transportation Needs: Not on file  Physical Activity: Not on file  Stress: Not on file  Social Connections: Not on file  Intimate Partner Violence: Not on file     Family History  Problem Relation Age of Onset  . Angina Mother   . Hypertension Mother   . Lung cancer Mother   . Hypertension Father   . Heart attack Father   . Nephrolithiasis Sister   . Hypertension Other        sibling     Review of Systems Positive for syncope dizziness Negative  for: General:  chills, fever, night sweats or weight changes.  Cardiovascular: PND orthopnea positive for syncope dizziness  Dermatological skin lesions rashes Respiratory: Cough congestion Urologic: Frequent urination urination at night and hematuria Abdominal: negative for nausea, vomiting, diarrhea, bright red blood per rectum, melena, or hematemesis Neurologic: negative for visual changes, and/or hearing changes  All other systems reviewed and are otherwise negative except as noted above.  Labs: Recent Labs    09/28/20 1221  CKTOTAL 29*   Lab Results  Component Value Date   WBC 7.1 09/29/2020   HGB 10.1 (L) 09/29/2020   HCT 29.6 (L) 09/29/2020   MCV 90.2 09/29/2020   PLT 235 09/29/2020    Recent Labs  Lab 09/29/20 0438  NA 136  K 3.9  CL 100  CO2 25  BUN 18  CREATININE 0.94  CALCIUM 9.4  GLUCOSE 98   No results found for: CHOL, HDL, LDLCALC, TRIG No results found for: DDIMER  Radiology/Studies:  DG Chest 2 View  Result Date: 09/28/2020 CLINICAL DATA:  Dizziness. EXAM: CHEST - 2 VIEW COMPARISON:  October 15, 2019 FINDINGS: Chronic scarring is seen within the lateral aspect of the left lung base. There is no evidence of acute infiltrate, pleural effusion or pneumothorax. The heart size and mediastinal contours are within normal limits. A  chronic deformity is seen involving the proximal left humerus. Degenerative changes seen throughout the thoracic spine. IMPRESSION: Chronic left basilar scarring without acute or active cardiopulmonary disease. Electronically Signed   By: Virgina Norfolk M.D.   On: 09/28/2020 23:52   DG Thoracic Spine 2 View  Result Date: 09/28/2020 CLINICAL DATA:  Status post fall. EXAM: THORACIC SPINE 2 VIEWS COMPARISON:  None. FINDINGS: There is no evidence of an acute thoracic spine fracture. There is mild levoscoliosis of the lower thoracic spine. Mild-to-moderate severity multilevel endplate sclerosis is seen with multilevel intervertebral disc space narrowing. No other significant bone abnormalities are identified. IMPRESSION: 1. Mild-to-moderate severity multilevel degenerative changes without an acute osseous abnormality. Electronically Signed   By: Virgina Norfolk M.D.   On: 09/28/2020 23:53   DG Lumbar Spine Complete  Result Date: 09/28/2020 CLINICAL DATA:  Status post fall. EXAM: LUMBAR SPINE - COMPLETE 4+ VIEW COMPARISON:  None. FINDINGS: There is no evidence of an acute lumbar spine fracture. There is marked severity dextroscoliosis of the lumbar spine. There is moderate to marked severity multilevel endplate sclerosis with moderate to marked severity multilevel intervertebral disc space narrowing. Multiple radiopaque surgical clips and surgical sutures are seen within the pelvis. IMPRESSION: 1. Marked severity dextroscoliosis of the lumbar spine with moderate to marked severity multilevel degenerative disc disease. Electronically Signed   By: Virgina Norfolk M.D.   On: 09/28/2020 23:54   CT Head Wo Contrast  Result Date: 09/29/2020 CLINICAL DATA:  Fall EXAM: CT HEAD WITHOUT CONTRAST CT CERVICAL SPINE WITHOUT CONTRAST TECHNIQUE: Multidetector CT imaging of the head and cervical spine was performed following the standard protocol without intravenous contrast. Multiplanar CT image reconstructions of  the cervical spine were also generated. COMPARISON:  CT head 07/22/2019, 07/17/2019, CT head and cervical spine 07/16/2019 FINDINGS: CT HEAD FINDINGS Brain: No evidence of acute infarction, hemorrhage, hydrocephalus, extra-axial collection, visible mass lesion or mass effect. Symmetric prominence of the ventricles, cisterns and sulci compatible with parenchymal volume loss. Patchy areas of white matter hypoattenuation are most compatible with chronic microvascular angiopathy. Vascular: Atherosclerotic calcification of the carotid siphons and intradural vertebral arteries. No hyperdense vessel. Skull: No calvarial fracture or suspicious  osseous lesion. No scalp swelling or hematoma. Sinuses/Orbits: Pneumatized secretions in the right maxillary and sphenoid sinuses. Additional mild pansinus mural disease and small bilateral mastoid effusions. Orbital structures are unremarkable aside from prior lens extractions. Other: None. CT CERVICAL SPINE FINDINGS Alignment: Stabilization collar is absent at the time of examination. There is mild rightward lateral cervical flexion and slight rightward cranial rotation. Unchanged degenerative anterolisthesis C3-C5 retrolisthesis C5 on 6. No evidence of traumatic listhesis. No abnormally widened, perched or jumped facets. Normal alignment of the craniocervical articulations. Arthrosis and narrowing of the atlantodental interval but with otherwise normal alignment of the atlantoaxial articulations. Skull base and vertebrae: No acute skull base fracture. No vertebral body fracture or height loss. Normal bone mineralization. No worrisome osseous lesions. Moderate arthrosis at the atlantodental and basion dens interval with some callus formation. Appearance is similar to prior. Multilevel cervical spondylitic changes are described below. Soft tissues and spinal canal: No pre or paravertebral fluid or swelling. No visible canal hematoma. Cervical carotid atherosclerosis at the level of  the bifurcations. Airways are patent. Disc levels: Multilevel cervical spondylitic changes are present, maximal C5-C7. Slightly larger disc osteophyte complexes present C4-5 and C6-7 partially efface the ventral thecal sac with spurring, retrolisthesis and some ligamentum flavum infolding at C5-6 resulting in mild canal stenosis. Multilevel uncinate spurring facet hypertrophic changes are present resulting in mild-to-moderate multilevel neural foraminal narrowing with more moderate to severe narrowing bilaterally C5-6 as well. Upper chest: No acute abnormality in the upper chest or imaged lung apices. Other: Normal thyroid. IMPRESSION: 1. No acute intracranial abnormality. No acute scalp swelling or calvarial fracture. 2. Stable parenchymal volume loss and chronic microvascular ischemic white matter disease. 3. Pneumatized secretions in the right maxillary and sphenoid sinuses. Additional mild pansinus mural disease and small bilateral mastoid effusions. Correlate for features of acute sinusitis. 4. No acute fracture or traumatic listhesis of the cervical spine. 5. Multilevel cervical spondylitic and facet degenerative changes, maximal C5-C7, as above. 6. Cervical and intracranial atherosclerosis. Electronically Signed   By: Lovena Le M.D.   On: 09/29/2020 00:20   CT Cervical Spine Wo Contrast  Result Date: 09/29/2020 CLINICAL DATA:  Fall EXAM: CT HEAD WITHOUT CONTRAST CT CERVICAL SPINE WITHOUT CONTRAST TECHNIQUE: Multidetector CT imaging of the head and cervical spine was performed following the standard protocol without intravenous contrast. Multiplanar CT image reconstructions of the cervical spine were also generated. COMPARISON:  CT head 07/22/2019, 07/17/2019, CT head and cervical spine 07/16/2019 FINDINGS: CT HEAD FINDINGS Brain: No evidence of acute infarction, hemorrhage, hydrocephalus, extra-axial collection, visible mass lesion or mass effect. Symmetric prominence of the ventricles, cisterns and  sulci compatible with parenchymal volume loss. Patchy areas of white matter hypoattenuation are most compatible with chronic microvascular angiopathy. Vascular: Atherosclerotic calcification of the carotid siphons and intradural vertebral arteries. No hyperdense vessel. Skull: No calvarial fracture or suspicious osseous lesion. No scalp swelling or hematoma. Sinuses/Orbits: Pneumatized secretions in the right maxillary and sphenoid sinuses. Additional mild pansinus mural disease and small bilateral mastoid effusions. Orbital structures are unremarkable aside from prior lens extractions. Other: None. CT CERVICAL SPINE FINDINGS Alignment: Stabilization collar is absent at the time of examination. There is mild rightward lateral cervical flexion and slight rightward cranial rotation. Unchanged degenerative anterolisthesis C3-C5 retrolisthesis C5 on 6. No evidence of traumatic listhesis. No abnormally widened, perched or jumped facets. Normal alignment of the craniocervical articulations. Arthrosis and narrowing of the atlantodental interval but with otherwise normal alignment of the atlantoaxial articulations. Skull base and  vertebrae: No acute skull base fracture. No vertebral body fracture or height loss. Normal bone mineralization. No worrisome osseous lesions. Moderate arthrosis at the atlantodental and basion dens interval with some callus formation. Appearance is similar to prior. Multilevel cervical spondylitic changes are described below. Soft tissues and spinal canal: No pre or paravertebral fluid or swelling. No visible canal hematoma. Cervical carotid atherosclerosis at the level of the bifurcations. Airways are patent. Disc levels: Multilevel cervical spondylitic changes are present, maximal C5-C7. Slightly larger disc osteophyte complexes present C4-5 and C6-7 partially efface the ventral thecal sac with spurring, retrolisthesis and some ligamentum flavum infolding at C5-6 resulting in mild canal stenosis.  Multilevel uncinate spurring facet hypertrophic changes are present resulting in mild-to-moderate multilevel neural foraminal narrowing with more moderate to severe narrowing bilaterally C5-6 as well. Upper chest: No acute abnormality in the upper chest or imaged lung apices. Other: Normal thyroid. IMPRESSION: 1. No acute intracranial abnormality. No acute scalp swelling or calvarial fracture. 2. Stable parenchymal volume loss and chronic microvascular ischemic white matter disease. 3. Pneumatized secretions in the right maxillary and sphenoid sinuses. Additional mild pansinus mural disease and small bilateral mastoid effusions. Correlate for features of acute sinusitis. 4. No acute fracture or traumatic listhesis of the cervical spine. 5. Multilevel cervical spondylitic and facet degenerative changes, maximal C5-C7, as above. 6. Cervical and intracranial atherosclerosis. Electronically Signed   By: Lovena Le M.D.   On: 09/29/2020 00:20   CT ABDOMEN PELVIS W CONTRAST  Result Date: 09/29/2020 CLINICAL DATA:  Status post fall and dizziness. EXAM: CT ABDOMEN AND PELVIS WITH CONTRAST TECHNIQUE: Multidetector CT imaging of the abdomen and pelvis was performed using the standard protocol following bolus administration of intravenous contrast. CONTRAST:  167mL OMNIPAQUE IOHEXOL 300 MG/ML  SOLN COMPARISON:  None. FINDINGS: Lower chest: No acute abnormality. Hepatobiliary: No focal liver abnormality is seen. No gallstones, gallbladder wall thickening, or biliary dilatation. Pancreas: Unremarkable. No pancreatic ductal dilatation or surrounding inflammatory changes. Spleen: Normal in size without focal abnormality. Adrenals/Urinary Tract: Adrenal glands are unremarkable. Kidneys are normal, without renal calculi, focal lesion, or hydronephrosis. Bladder is unremarkable. Stomach/Bowel: There is a small hiatal hernia. The appendix is surgically absent. No evidence of bowel wall thickening, distention, or inflammatory  changes. Noninflamed diverticula are seen throughout the large bowel. Vascular/Lymphatic: Moderate severity aortic calcification and tortuosity. No enlarged abdominal or pelvic lymph nodes. Reproductive: Status post hysterectomy. No adnexal masses. Other: No abdominal wall hernia or abnormality. Multiple surgical clips are seen within the mid and lower left abdomen and posterior aspect of the mid to lower pelvis. No abdominopelvic ascites. Musculoskeletal: There is marked severity dextroscoliosis of the lumbar spine with marked severity multilevel degenerative changes. IMPRESSION: 1. Small hiatal hernia. 2. Colonic diverticulosis. 3. Marked severity dextroscoliosis of the lumbar spine with marked severity multilevel degenerative changes. 4. Aortic atherosclerosis. Aortic Atherosclerosis (ICD10-I70.0). Electronically Signed   By: Virgina Norfolk M.D.   On: 09/29/2020 01:34    EKG: Atrial fibrillation with variable heart rate and nonspecific ST and T wave changes  Weights: Filed Weights   09/28/20 1215 09/29/20 0951  Weight: 59 kg 59 kg     Physical Exam: Blood pressure (!) 144/83, pulse 71, temperature 97.8 F (36.6 C), temperature source Oral, resp. rate 18, height 5\' 1"  (1.549 m), weight 59 kg, SpO2 98 %. Body mass index is 24.56 kg/m. General: Well developed, well nourished, in no acute distress. Head eyes ears nose throat: Normocephalic, atraumatic, sclera non-icteric, no xanthomas, nares are  without discharge. No apparent thyromegaly and/or mass  Lungs: Normal respiratory effort.  no wheezes, no rales, few rhonchi.  Heart: Irregular with normal S1 S2. no murmur gallop, no rub, PMI is normal size and placement, carotid upstroke normal without bruit, jugular venous pressure is normal Abdomen: Soft, non-tender, non-distended with normoactive bowel sounds. No hepatomegaly. No rebound/guarding. No obvious abdominal masses. Abdominal aorta is normal size without bruit Extremities: Trace edema.  no cyanosis, no clubbing, no ulcers  Peripheral : 2+ bilateral upper extremity pulses, 2+ bilateral femoral pulses, 2+ bilateral dorsal pedal pulse Neuro: Alert and oriented. No facial asymmetry. No focal deficit. Moves all extremities spontaneously. Musculoskeletal: Normal muscle tone without kyphosis Psych:  Responds to questions appropriately with a normal affect.    Assessment: 79 year old female with atrial fibrillation with rapid ventricular rate anemia coronary artery disease chronic diastolic dysfunction heart failure with recurrent episodes of fall and/or syncope with urinary tract infection and hypokalemia without current evidence of myocardial infarction or acute coronary syndrome  Plan: 1.  Continue telemetry and evaluation of rhythm disturbances possibly causing her episodes of fall unsteadiness and/or possible syncope 2.  Treatment of urinary tract infection hypokalemia as a possible contributing factor to above 3.  No further cardiac diagnostics with recent echocardiogram showing normal LV systolic function and no current evidence of acute coronary syndrome or congestive heart failure 4.  Further consideration of placing a loop monitor if patient has no other primary causes revealed during this hospitalization 5.  Further treatment of other risk factors including hypertension and hyperlipidemia as medications as before 6.  Continue anticoagulation for further risk reduction in stroke with atrial fibrillation with Eliquis Signed, Corey Skains M.D. [facility] 09/29/2020, 12:23 PM

## 2020-09-29 NOTE — Evaluation (Signed)
Occupational Therapy Evaluation Patient Details Name: Melinda Maxwell MRN: [id number] DOB: 08/09/1941 Today's Date: 09/29/2020    History of Present Illness Patient is an 79 y.o. female with medical history significant for CAD, diastolic CHF, paroxysmal A. fib,, Parkinson's disease, chronic dizziness and physical deconditioning, HTN, hospitalized in October syncopal and fall with subdural hematoma. Patient brought into the emergency room after she had two brief syncopal events at home, resulting in a fall without injury.   Clinical Impression   Patient presenting with impaired cognition, decreased safety awareness, impaired balance, and decreased activity tolerance. Patient is a poor historian; pt reported being able to complete some self-care tasks independently but also reported requiring assistance from sister and personal care assistance for bathing, meal preparation, and community mobility PTA. Patient reports using a RW for functional mobility. At end of session, personal care assistant Vaughan Basta) arrived and reports that patient "lives with daughter, but daughter does not want to provide care and would rather she stay in a supportive living facility." Patient currently functioning with min guard for grooming tasks while sitting EOB. Patient will benefit from acute OT to increase overall independence in the areas of ADLs, functional mobility, safety awareness, and balance in order to safely discharge home.     Follow Up Recommendations  Home health OT;Supervision/Assistance - 24 hour    Equipment Recommendations  3 in 1 bedside commode    Recommendations for Other Services       Precautions / Restrictions Precautions Precautions: Fall      Mobility Bed Mobility Overal bed mobility: Needs Assistance Bed Mobility: Supine to Sit     Supine to sit: Min assist     General bed mobility comments: pt left sitting EOB with caregiver; RN notified       Balance Overall balance  assessment: Needs assistance   Sitting balance-Leahy Scale: Good Sitting balance - Comments: Able to sit unsupport at EOB to don socks                                   ADL either performed or assessed with clinical judgement   ADL Overall ADL's : Needs assistance/impaired     Grooming: Oral care;Set up;Supervision/safety;Cueing for sequencing;Sitting Grooming Details (indicate cue type and reason): at EOB             Lower Body Dressing: Min guard Lower Body Dressing Details (indicate cue type and reason): to don socks. required verbal cues for attending to task                     Vision Baseline Vision/History: Wears glasses Wears Glasses: Reading only              Pertinent Vitals/Pain Pain Assessment: Faces Faces Pain Scale: Hurts a little bit Pain Location: "butt crack" Pain Descriptors / Indicators: Discomfort Pain Intervention(s): Limited activity within patient's tolerance        Extremity/Trunk Assessment Upper Extremity Assessment Upper Extremity Assessment: Generalized weakness   Lower Extremity Assessment Lower Extremity Assessment: Defer to PT evaluation       Communication Communication Communication: No difficulties   Cognition Arousal/Alertness: Awake/alert Behavior During Therapy: WFL for tasks assessed/performed Overall Cognitive Status: History of cognitive impairments - at baseline  General Comments: patient is cooperative and able to follow single step commands consistently. patient is oriented to person, situation but not place or time. Caregiver present and confirmed this is present at baseline. Pt reports having short term memory impairments   General Comments               Home Living Family/patient expects to be discharged to:: Private residence Living Arrangements: Children Available Help at Discharge: Family;Available PRN/intermittently;Personal care  attendant Type of Home: House             Bathroom Shower/Tub: Tub/shower unit         Home Equipment: Environmental consultant - 2 wheels;Shower seat   Additional Comments: Pt is a poor historian. Pt reports living alone, however personal care assistant reports that daughter lives with pt. Pt reports receiving help from a personal care assistant Vaughan Basta) 1x/week for grooming/self-care tasks.      Prior Functioning/Environment Level of Independence: Needs assistance  Gait / Transfers Assistance Needed: patient reports she ambulates with rolling walker at home, limited distances ADL's / Homemaking Assistance Needed: patient reports she has assistance with ADLs at baseline (patient mentioned someone that comes in to help her with bathing but patient is a poor historian)            OT Problem List: Decreased strength;Decreased activity tolerance;Impaired balance (sitting and/or standing);Decreased cognition;Decreased safety awareness;Decreased knowledge of precautions      OT Treatment/Interventions: Self-care/ADL training;Therapeutic exercise;Energy conservation;DME and/or AE instruction;Therapeutic activities;Cognitive remediation/compensation;Patient/family education;Balance training    OT Goals(Current goals can be found in the care plan section) Acute Rehab OT Goals Patient Stated Goal: To take care of herself OT Goal Formulation: With patient Time For Goal Achievement: 10/13/20 Potential to Achieve Goals: Fair ADL Goals Pt Will Perform Grooming: with supervision;standing Pt Will Perform Lower Body Dressing: sit to/from stand;with supervision Pt Will Transfer to Toilet: regular height toilet;with supervision;ambulating Pt Will Perform Toileting - Clothing Manipulation and hygiene: with supervision;sit to/from stand  OT Frequency: Min 1X/week   Barriers to D/C: Decreased caregiver support  Personal care assistance reports that pt's daughter does not want to provide 24-hour supervision           AM-PAC OT "6 Clicks" Daily Activity     Outcome Measure Help from another person eating meals?: A Little Help from another person taking care of personal grooming?: A Lot Help from another person toileting, which includes using toliet, bedpan, or urinal?: A Lot Help from another person bathing (including washing, rinsing, drying)?: A Lot Help from another person to put on and taking off regular upper body clothing?: A Little Help from another person to put on and taking off regular lower body clothing?: A Little 6 Click Score: 15   End of Session Nurse Communication: Mobility status;Precautions  Activity Tolerance: Patient tolerated treatment well Patient left: in bed;with family/visitor present;with bed alarm set  OT Visit Diagnosis: Unsteadiness on feet (R26.81);Repeated falls (R29.6);History of falling (Z91.81);Other symptoms and signs involving cognitive function                Time: 1415-1454 OT Time Calculation (min): 39 min Charges:  OT General Charges $OT Visit: 1 Visit OT Evaluation $OT Eval Moderate Complexity: 1 Mod OT Treatments $Self Care/Home Management : 23-37 mins   Cyriah Childrey D Niyla Marone, OTR/L 09/29/2020, 4:13 PM

## 2020-09-29 NOTE — ED Notes (Signed)
Pt assisted with this RN and Teaching laboratory technician to bathroom. Pt ambulatory with 2 person assist. Pt assisted back to bed. Pt given blankets and states she feels much better now.

## 2020-09-29 NOTE — ED Notes (Signed)
PT at bedside.

## 2020-09-29 NOTE — ED Notes (Signed)
PT at bedside at this time 

## 2020-09-29 NOTE — ED Notes (Signed)
Pt visitor is a family friend who checks in on the pt once a week. Per her, the pt's daughter, who she lives with, "does not really do anything to care for her or help her out. She hasn't had a bath since October."  Admitting MD notified for potential SW or APS involvement

## 2020-09-29 NOTE — ED Notes (Signed)
Meal tray given at this time 

## 2020-09-29 NOTE — ED Notes (Signed)
Pt given breakfast tray. Pt pulled up in bed and eating at this time.

## 2020-09-29 NOTE — NC FL2 (Signed)
[place] LEVEL OF CARE SCREENING TOOL     IDENTIFICATION  Patient Name: Melinda Maxwell Birthdate: 01/19/1941 Sex: female Admission Date (Current Location): 09/28/2020  [place] and [place] Number:  Engineering geologist and Address:  [facility], [address], [place], [place] [address]      Provider Number: [id number]  Attending Physician Name and Address:  Sharen Hones, MD  Relative Name and Phone Number:  Kerly, Rigs[place] (Daughter)   [phone]    Current Level of Care: Hospital Recommended Level of Care: [place] Prior Approval Number:    Date Approved/Denied:   PASRR Number: [id number]  Discharge Plan: SNF    Current Diagnoses: Patient Active Problem List   Diagnosis Date Noted  . Hypokalemia 09/29/2020  . Type 2 diabetes mellitus without complication ([place]) 25/02/3975  . Chronic anticoagulation 09/29/2020  . Parkinson disease ([place]) 09/29/2020  . Subdural hematoma ([place]) 07/16/2019  . Closed displaced fracture of surgical neck of left humerus 07/16/2019  . Non-sustained ventricular tachycardia ([place]) 07/16/2019  . Chronic diastolic CHF (congestive heart failure) ([place]) 07/16/2019  . CKD (chronic kidney disease), stage III ([place]) 07/16/2019  . Syncope   . Fall   . CAD (coronary artery disease) 03/26/2018  . Generalized weakness 03/26/2018  . Paroxysmal atrial fibrillation ([place]) 02/27/2018  . UTI (urinary tract infection) 02/18/2018  . Stable angina ([place]) 01/15/2018  . Hyperlipidemia, mixed 09/10/2014  . Moderate tricuspid insufficiency 09/10/2014  . Chronic deep vein thrombosis (DVT) ([facility]) 08/10/2014  . Diabetes mellitus with stage 3 chronic kidney disease ([place]) 07/09/2014  . Benign essential hypertension 02/21/2014  . Gout 02/21/2014  . OSA (obstructive sleep apnea) 02/21/2014    Orientation RESPIRATION BLADDER Height & Weight     Self,Time,Place    Continent Weight: 130 lb (59 kg) Height:  5\' 1"   (154.9 cm)  BEHAVIORAL SYMPTOMS/MOOD NEUROLOGICAL BOWEL NUTRITION STATUS      Continent    AMBULATORY STATUS COMMUNICATION OF NEEDS Skin   Extensive Assist Verbally                         Personal Care Assistance Level of Assistance  Bathing,Feeding,Dressing,Total care Bathing Assistance: Independent Feeding assistance: Independent Dressing Assistance: Limited assistance Total Care Assistance: Limited assistance   Functional Limitations Info             SPECIAL CARE FACTORS FREQUENCY  PT (By licensed PT),OT (By licensed OT)     PT Frequency: 5 x weekly OT Frequency: 5 x weekly            Contractures      Additional Factors Info  Code Status,Allergies Code Status Info: Full Allergies Info: benzocaine, lisinopril, neomycin, sulfamethoxazoletrimethoprim, sulfa antibiotics           Current Medications (09/29/2020):  This is the current hospital active medication list Current Facility-Administered Medications  Medication Dose Route Frequency Provider Last Rate Last Admin  . acetaminophen (TYLENOL) tablet 650 mg  650 mg Oral Q6H PRN Athena Masse, MD       Or  . acetaminophen (TYLENOL) suppository 650 mg  650 mg Rectal Q6H PRN Athena Masse, MD      . allopurinol (ZYLOPRIM) tablet 300 mg  300 mg Oral Daily Sharen Hones, MD   300 mg at 09/29/20 1048  . amLODipine (NORVASC) tablet 5 mg  5 mg Oral Daily Sharen Hones, MD   5 mg at 09/29/20 0909  . apixaban (ELIQUIS) tablet  5 mg  5 mg Oral BID Andris Baumannuncan, Hazel V, MD   5 mg at 09/29/20 [id number]  . aspirin chewable tablet 81 mg  81 mg Oral Daily Marrion CoyZhang, Dekui, MD   81 mg at 09/29/20 0910  . carbidopa-levodopa (SINEMET IR) 25-100 MG per tablet immediate release 1 tablet  1 tablet Oral QHS Hall, Scott A, RPH      . carbidopa-levodopa (SINEMET IR) 25-100 MG per tablet immediate release 2 tablet  2 tablet Oral TID with meals Andris Baumannuncan, Hazel V, MD   2 tablet at 09/29/20 1339  . cefTRIAXone (ROCEPHIN) 1 g in sodium chloride 0.9 %  100 mL IVPB  1 g Intravenous Q24H Lindajo Royaluncan, Hazel V, MD      . donepezil (ARICEPT) tablet 10 mg  10 mg Oral QHS Marrion CoyZhang, Dekui, MD      . insulin aspart (novoLOG) injection 0-5 Units  0-5 Units Subcutaneous QHS Lindajo Royaluncan, Hazel V, MD      . insulin aspart (novoLOG) injection 0-9 Units  0-9 Units Subcutaneous TID WC Lindajo Royaluncan, Hazel V, MD      . nitroGLYCERIN (NITROSTAT) SL tablet 0.4 mg  0.4 mg Sublingual Q5 min PRN Marrion CoyZhang, Dekui, MD      . ondansetron [facility](ZOFRAN) tablet 4 mg  4 mg Oral Q6H PRN Andris Baumannuncan, Hazel V, MD       Or  . ondansetron [facility](ZOFRAN) injection 4 mg  4 mg Intravenous Q6H PRN Andris Baumannuncan, Hazel V, MD      . simvastatin (ZOCOR) tablet 10 mg  10 mg Oral Daily Marrion CoyZhang, Dekui, MD   10 mg at 09/29/20 0910  . sodium chloride flush (NS) 0.9 % injection 3 mL  3 mL Intravenous Q12H Andris Baumannuncan, Hazel V, MD   3 mL at 09/29/20 [id number]   Current Outpatient Medications  Medication Sig Dispense Refill  . allopurinol (ZYLOPRIM) 300 MG tablet Take 300 mg by mouth daily.    Marland Kitchen. apixaban (ELIQUIS) 5 MG TABS tablet Take 1 tablet (5 mg total) by mouth 2 (two) times daily. 60 tablet 0  . aspirin 81 MG chewable tablet Chew 1 tablet (81 mg total) by mouth daily. 30 tablet 0  . carbidopa-levodopa (SINEMET IR) 25-100 MG tablet Take 1-2 tablets by mouth 4 (four) times daily. Take 2 tablets at breakfast, lunch and dinner Take 1  tablet at bedtime    . Cholecalciferol (VITAMIN D3) 50 MCG (2000 UT) capsule Take by mouth.    . colchicine 0.6 MG tablet Take by mouth.    . diltiazem (CARDIZEM CD) 180 MG 24 hr capsule Take 180 mg by mouth daily.    Marland Kitchen. donepezil (ARICEPT) 10 MG tablet Take 10 mg by mouth at bedtime.    . hydrALAZINE (APRESOLINE) 10 MG tablet Take 1 tablet (10 mg total) by mouth every 8 (eight) hours. 90 tablet 0  . mometasone (ELOCON) 0.1 % lotion Apply 1 application topically at bedtime as needed.    . pantoprazole (PROTONIX) 40 MG tablet Take 40 mg by mouth daily.   2  . simvastatin (ZOCOR) 10 MG tablet Take 10 mg by mouth  daily. HLD- reduced d/t interaction with Diltiazem- increased r/f Rhabdomyelosis    . nitroGLYCERIN (NITROSTAT) 0.4 MG SL tablet Place 0.4 mg under the tongue every 5 (five) minutes as needed for chest pain.   0     Discharge Medications: Please see discharge summary for a list of discharge medications.  Relevant Imaging Results:  Relevant Lab Results:   Additional Information SSN: [id number]  Meriel Flavors, LCSW

## 2020-09-29 NOTE — ED Notes (Signed)
Pt expresses hesitation about having a female RN, states it makes her feel uncomfortable. Pt notified that she has a bed upstairs, and the receiving RN is female. Will utilize female staff to perform sensitive tasks per pt request

## 2020-09-29 NOTE — ED Notes (Signed)
Admitting MD messaged regarding pt rash noted earlier

## 2020-09-29 NOTE — Progress Notes (Signed)
Pt does not remember when she exactly took her last dose of medications.

## 2020-09-29 NOTE — ED Notes (Signed)
Pt cleaned up and placed in clean gown. chux placed on bed and external cath placed. Pt provided with call bell. OT at bedside to work with pt. Yellow socks placed on pt.  Pt did have some yeast with foul odor under left breast. Pt also had small yeast spot noted to right lower waist. Both areas cleaned and dried. RN Olen Cordial informed of areas.

## 2020-09-29 NOTE — TOC Progression Note (Addendum)
Transition of Care [facility]) - Progression Note    Patient Details  Name: Melinda Maxwell MRN: [id number] Date of Birth: Mar 26, 1941  Transition of [facility]) CM/SW [place], LCSW Phone Number: 09/29/2020, 2:09 PM  Clinical Narrative:    CSW spoke with POA daughter, Melinda Maxwell to discuss discharge plan. Patient is recommended for skilled nursing facility for rehab. Patient and family are agreeable to SNF placement. CSW offered facilities list, but Melinda Maxwell declined a list stating the patient has been before. Melinda Maxwell requested to start with [facility], and [facility]. CSW completed workup, sent to those facilities for bed offers and submitted auth request via portal. Melinda Maxwell also added she feels like it may be time to consider LTC in the facility this time.       Expected Discharge Plan and Services                                                 Social Determinants of Health (SDOH) Interventions    Readmission Risk Interventions No flowsheet data found.

## 2020-09-30 DIAGNOSIS — G20 Parkinson's disease: Secondary | ICD-10-CM | POA: Diagnosis not present

## 2020-09-30 DIAGNOSIS — N3000 Acute cystitis without hematuria: Secondary | ICD-10-CM

## 2020-09-30 DIAGNOSIS — R55 Syncope and collapse: Secondary | ICD-10-CM | POA: Diagnosis not present

## 2020-09-30 LAB — GLUCOSE, CAPILLARY
Glucose-Capillary: 85 mg/dL (ref 70–99)
Glucose-Capillary: 77 mg/dL (ref 70–99)
Glucose-Capillary: 87 mg/dL (ref 70–99)
Glucose-Capillary: 116 mg/dL — ABNORMAL HIGH (ref 70–99)
Glucose-Capillary: 96 mg/dL (ref 70–99)

## 2020-09-30 MED ORDER — ENSURE ENLIVE PO LIQD
237.0000 mL | Freq: Two times a day (BID) | ORAL | Status: DC
  Administered 2020-09-30 – 2020-10-06 (×10): 237 mL via ORAL

## 2020-09-30 MED ORDER — ADULT MULTIVITAMIN W/MINERALS CH
1.0000 | ORAL_TABLET | Freq: Every day | ORAL | Status: DC
  Administered 2020-10-01 – 2020-10-06 (×5): 1 via ORAL
  Filled 2020-09-30 (×6): qty 1

## 2020-09-30 MED ORDER — FOSFOMYCIN TROMETHAMINE 3 G PO PACK
3.0000 g | PACK | Freq: Once | ORAL | Status: AC
  Administered 2020-09-30: 13:00:00 3 g via ORAL
  Filled 2020-09-30: qty 3

## 2020-09-30 MED ORDER — PANTOPRAZOLE SODIUM 40 MG PO TBEC
40.0000 mg | DELAYED_RELEASE_TABLET | Freq: Every day | ORAL | Status: DC
  Administered 2020-09-30 – 2020-10-06 (×6): 40 mg via ORAL
  Filled 2020-09-30 (×6): qty 1

## 2020-09-30 NOTE — Progress Notes (Signed)
[facility] Encounter Note  Patient: Melinda Maxwell / Admit Date: 09/28/2020 / Date of Encounter: 09/30/2020, 9:23 AM   Subjective: 12/24.  Patient has rested well overnight with no further evidence of significant anginal symptoms or congestive heart failure syncope or falls.  The patient did have atrial fibrillation with rapid ventricular rate on admission but now has spontaneously converted to normal sinus rhythm with preventricular contractions.  There is no evidence of advanced heart block or significant pauses by telemetry.  The patient has had chronic diastolic dysfunction heart failure but currently has no evidence of significant symptoms of that.  Troponin levels have been 24/24 consistent with demand ischemia rather than acute coronary syndrome.  The patient does have urinary tract infection which may have influenced her weakness fatigue and falls.  Review of Systems: Positive for: Shortness of breath Negative for: Vision change, hearing change, syncope, dizziness, nausea, vomiting,diarrhea, bloody stool, stomach pain, cough, congestion, diaphoresis, urinary frequency, urinary pain,skin lesions, skin rashes Others previously listed  Objective: Telemetry: Normal sinus rhythm with preventricular contractions Physical Exam: Blood pressure (!) 158/76, pulse 64, temperature 97.9 F (36.6 C), temperature source Oral, resp. rate 16, height 5\' 1"  (1.549 m), weight 57.9 kg, SpO2 100 %. Body mass index is 24.12 kg/m. General: Well developed, well nourished, in no acute distress. Head: Normocephalic, atraumatic, sclera non-icteric, no xanthomas, nares are without discharge. Neck: No apparent masses Lungs: Normal respirations with no wheezes, no rhonchi, no rales , no crackles   Heart: Irregular rate and rhythm, normal S1 S2, no murmur, no rub, no gallop, PMI is normal size and placement, carotid upstroke normal without bruit, jugular venous pressure normal Abdomen: Soft,  non-tender, non-distended with normoactive bowel sounds. No hepatosplenomegaly. Abdominal aorta is normal size without bruit Extremities: Trace edema, no clubbing, no cyanosis, no ulcers,  Peripheral: 2+ radial, 2+ femoral, 2+ dorsal pedal pulses Neuro: Alert and oriented. Moves all extremities spontaneously. Psych:  Responds to questions appropriately with a normal affect.   Intake/Output Summary (Last 24 hours) at 09/30/2020 0923 Last data filed at 09/30/2020 0916 Gross per 24 hour  Intake 114.57 ml  Output --  Net 114.57 ml    Inpatient Medications:  . allopurinol  300 mg Oral Daily  . amLODipine  5 mg Oral Daily  . apixaban  5 mg Oral BID  . aspirin  81 mg Oral Daily  . carbidopa-levodopa  1 tablet Oral QHS  . carbidopa-levodopa  2 tablet Oral TID with meals  . donepezil  10 mg Oral QHS  . insulin aspart  0-5 Units Subcutaneous QHS  . insulin aspart  0-9 Units Subcutaneous TID WC  . nystatin   Topical TID  . simvastatin  10 mg Oral Daily  . sodium chloride flush  3 mL Intravenous Q12H   Infusions:  . sodium chloride Stopped (09/29/20 2351)  . cefTRIAXone (ROCEPHIN)  IV Stopped (09/29/20 2242)    Labs: Recent Labs    09/28/20 1221 09/29/20 0438  NA 135 136  K 2.8* 3.9  CL 96* 100  CO2 26 25  GLUCOSE 103* 98  BUN 17 18  CREATININE 0.94 0.94  CALCIUM 9.7 9.4   No results for input(s): AST, ALT, ALKPHOS, BILITOT, PROT, ALBUMIN in the last 72 hours. Recent Labs    09/28/20 1221 09/29/20 0438  WBC 6.1 7.1  HGB 10.7* 10.1*  HCT 32.3* 29.6*  MCV 91.0 90.2  PLT 253 235   Recent Labs    09/28/20 1221  CKTOTAL 29*   Invalid input(s): POCBNP Recent Labs    09/29/20 0438  HGBA1C 5.4     Weights: Filed Weights   09/28/20 1215 09/29/20 0951 09/30/20 0412  Weight: 59 kg 59 kg 57.9 kg     Radiology/Studies:  DG Chest 2 View  Result Date: 09/28/2020 CLINICAL DATA:  Dizziness. EXAM: CHEST - 2 VIEW COMPARISON:  October 15, 2019 FINDINGS: Chronic  scarring is seen within the lateral aspect of the left lung base. There is no evidence of acute infiltrate, pleural effusion or pneumothorax. The heart size and mediastinal contours are within normal limits. A chronic deformity is seen involving the proximal left humerus. Degenerative changes seen throughout the thoracic spine. IMPRESSION: Chronic left basilar scarring without acute or active cardiopulmonary disease. Electronically Signed   By: Virgina Norfolk M.D.   On: 09/28/2020 23:52   DG Thoracic Spine 2 View  Result Date: 09/28/2020 CLINICAL DATA:  Status post fall. EXAM: THORACIC SPINE 2 VIEWS COMPARISON:  None. FINDINGS: There is no evidence of an acute thoracic spine fracture. There is mild levoscoliosis of the lower thoracic spine. Mild-to-moderate severity multilevel endplate sclerosis is seen with multilevel intervertebral disc space narrowing. No other significant bone abnormalities are identified. IMPRESSION: 1. Mild-to-moderate severity multilevel degenerative changes without an acute osseous abnormality. Electronically Signed   By: Virgina Norfolk M.D.   On: 09/28/2020 23:53   DG Lumbar Spine Complete  Result Date: 09/28/2020 CLINICAL DATA:  Status post fall. EXAM: LUMBAR SPINE - COMPLETE 4+ VIEW COMPARISON:  None. FINDINGS: There is no evidence of an acute lumbar spine fracture. There is marked severity dextroscoliosis of the lumbar spine. There is moderate to marked severity multilevel endplate sclerosis with moderate to marked severity multilevel intervertebral disc space narrowing. Multiple radiopaque surgical clips and surgical sutures are seen within the pelvis. IMPRESSION: 1. Marked severity dextroscoliosis of the lumbar spine with moderate to marked severity multilevel degenerative disc disease. Electronically Signed   By: Virgina Norfolk M.D.   On: 09/28/2020 23:54   CT Head Wo Contrast  Result Date: 09/29/2020 CLINICAL DATA:  Fall EXAM: CT HEAD WITHOUT CONTRAST CT  CERVICAL SPINE WITHOUT CONTRAST TECHNIQUE: Multidetector CT imaging of the head and cervical spine was performed following the standard protocol without intravenous contrast. Multiplanar CT image reconstructions of the cervical spine were also generated. COMPARISON:  CT head 07/22/2019, 07/17/2019, CT head and cervical spine 07/16/2019 FINDINGS: CT HEAD FINDINGS Brain: No evidence of acute infarction, hemorrhage, hydrocephalus, extra-axial collection, visible mass lesion or mass effect. Symmetric prominence of the ventricles, cisterns and sulci compatible with parenchymal volume loss. Patchy areas of white matter hypoattenuation are most compatible with chronic microvascular angiopathy. Vascular: Atherosclerotic calcification of the carotid siphons and intradural vertebral arteries. No hyperdense vessel. Skull: No calvarial fracture or suspicious osseous lesion. No scalp swelling or hematoma. Sinuses/Orbits: Pneumatized secretions in the right maxillary and sphenoid sinuses. Additional mild pansinus mural disease and small bilateral mastoid effusions. Orbital structures are unremarkable aside from prior lens extractions. Other: None. CT CERVICAL SPINE FINDINGS Alignment: Stabilization collar is absent at the time of examination. There is mild rightward lateral cervical flexion and slight rightward cranial rotation. Unchanged degenerative anterolisthesis C3-C5 retrolisthesis C5 on 6. No evidence of traumatic listhesis. No abnormally widened, perched or jumped facets. Normal alignment of the craniocervical articulations. Arthrosis and narrowing of the atlantodental interval but with otherwise normal alignment of the atlantoaxial articulations. Skull base and vertebrae: No acute skull base fracture. No vertebral body fracture or height  loss. Normal bone mineralization. No worrisome osseous lesions. Moderate arthrosis at the atlantodental and basion dens interval with some callus formation. Appearance is similar to  prior. Multilevel cervical spondylitic changes are described below. Soft tissues and spinal canal: No pre or paravertebral fluid or swelling. No visible canal hematoma. Cervical carotid atherosclerosis at the level of the bifurcations. Airways are patent. Disc levels: Multilevel cervical spondylitic changes are present, maximal C5-C7. Slightly larger disc osteophyte complexes present C4-5 and C6-7 partially efface the ventral thecal sac with spurring, retrolisthesis and some ligamentum flavum infolding at C5-6 resulting in mild canal stenosis. Multilevel uncinate spurring facet hypertrophic changes are present resulting in mild-to-moderate multilevel neural foraminal narrowing with more moderate to severe narrowing bilaterally C5-6 as well. Upper chest: No acute abnormality in the upper chest or imaged lung apices. Other: Normal thyroid. IMPRESSION: 1. No acute intracranial abnormality. No acute scalp swelling or calvarial fracture. 2. Stable parenchymal volume loss and chronic microvascular ischemic white matter disease. 3. Pneumatized secretions in the right maxillary and sphenoid sinuses. Additional mild pansinus mural disease and small bilateral mastoid effusions. Correlate for features of acute sinusitis. 4. No acute fracture or traumatic listhesis of the cervical spine. 5. Multilevel cervical spondylitic and facet degenerative changes, maximal C5-C7, as above. 6. Cervical and intracranial atherosclerosis. Electronically Signed   By: Lovena Le M.D.   On: 09/29/2020 00:20   CT Cervical Spine Wo Contrast  Result Date: 09/29/2020 CLINICAL DATA:  Fall EXAM: CT HEAD WITHOUT CONTRAST CT CERVICAL SPINE WITHOUT CONTRAST TECHNIQUE: Multidetector CT imaging of the head and cervical spine was performed following the standard protocol without intravenous contrast. Multiplanar CT image reconstructions of the cervical spine were also generated. COMPARISON:  CT head 07/22/2019, 07/17/2019, CT head and cervical spine  07/16/2019 FINDINGS: CT HEAD FINDINGS Brain: No evidence of acute infarction, hemorrhage, hydrocephalus, extra-axial collection, visible mass lesion or mass effect. Symmetric prominence of the ventricles, cisterns and sulci compatible with parenchymal volume loss. Patchy areas of white matter hypoattenuation are most compatible with chronic microvascular angiopathy. Vascular: Atherosclerotic calcification of the carotid siphons and intradural vertebral arteries. No hyperdense vessel. Skull: No calvarial fracture or suspicious osseous lesion. No scalp swelling or hematoma. Sinuses/Orbits: Pneumatized secretions in the right maxillary and sphenoid sinuses. Additional mild pansinus mural disease and small bilateral mastoid effusions. Orbital structures are unremarkable aside from prior lens extractions. Other: None. CT CERVICAL SPINE FINDINGS Alignment: Stabilization collar is absent at the time of examination. There is mild rightward lateral cervical flexion and slight rightward cranial rotation. Unchanged degenerative anterolisthesis C3-C5 retrolisthesis C5 on 6. No evidence of traumatic listhesis. No abnormally widened, perched or jumped facets. Normal alignment of the craniocervical articulations. Arthrosis and narrowing of the atlantodental interval but with otherwise normal alignment of the atlantoaxial articulations. Skull base and vertebrae: No acute skull base fracture. No vertebral body fracture or height loss. Normal bone mineralization. No worrisome osseous lesions. Moderate arthrosis at the atlantodental and basion dens interval with some callus formation. Appearance is similar to prior. Multilevel cervical spondylitic changes are described below. Soft tissues and spinal canal: No pre or paravertebral fluid or swelling. No visible canal hematoma. Cervical carotid atherosclerosis at the level of the bifurcations. Airways are patent. Disc levels: Multilevel cervical spondylitic changes are present, maximal  C5-C7. Slightly larger disc osteophyte complexes present C4-5 and C6-7 partially efface the ventral thecal sac with spurring, retrolisthesis and some ligamentum flavum infolding at C5-6 resulting in mild canal stenosis. Multilevel uncinate spurring facet hypertrophic changes are  present resulting in mild-to-moderate multilevel neural foraminal narrowing with more moderate to severe narrowing bilaterally C5-6 as well. Upper chest: No acute abnormality in the upper chest or imaged lung apices. Other: Normal thyroid. IMPRESSION: 1. No acute intracranial abnormality. No acute scalp swelling or calvarial fracture. 2. Stable parenchymal volume loss and chronic microvascular ischemic white matter disease. 3. Pneumatized secretions in the right maxillary and sphenoid sinuses. Additional mild pansinus mural disease and small bilateral mastoid effusions. Correlate for features of acute sinusitis. 4. No acute fracture or traumatic listhesis of the cervical spine. 5. Multilevel cervical spondylitic and facet degenerative changes, maximal C5-C7, as above. 6. Cervical and intracranial atherosclerosis. Electronically Signed   By: Lovena Le M.D.   On: 09/29/2020 00:20   CT ABDOMEN PELVIS W CONTRAST  Result Date: 09/29/2020 CLINICAL DATA:  Status post fall and dizziness. EXAM: CT ABDOMEN AND PELVIS WITH CONTRAST TECHNIQUE: Multidetector CT imaging of the abdomen and pelvis was performed using the standard protocol following bolus administration of intravenous contrast. CONTRAST:  140mL OMNIPAQUE IOHEXOL 300 MG/ML  SOLN COMPARISON:  None. FINDINGS: Lower chest: No acute abnormality. Hepatobiliary: No focal liver abnormality is seen. No gallstones, gallbladder wall thickening, or biliary dilatation. Pancreas: Unremarkable. No pancreatic ductal dilatation or surrounding inflammatory changes. Spleen: Normal in size without focal abnormality. Adrenals/Urinary Tract: Adrenal glands are unremarkable. Kidneys are normal, without  renal calculi, focal lesion, or hydronephrosis. Bladder is unremarkable. Stomach/Bowel: There is a small hiatal hernia. The appendix is surgically absent. No evidence of bowel wall thickening, distention, or inflammatory changes. Noninflamed diverticula are seen throughout the large bowel. Vascular/Lymphatic: Moderate severity aortic calcification and tortuosity. No enlarged abdominal or pelvic lymph nodes. Reproductive: Status post hysterectomy. No adnexal masses. Other: No abdominal wall hernia or abnormality. Multiple surgical clips are seen within the mid and lower left abdomen and posterior aspect of the mid to lower pelvis. No abdominopelvic ascites. Musculoskeletal: There is marked severity dextroscoliosis of the lumbar spine with marked severity multilevel degenerative changes. IMPRESSION: 1. Small hiatal hernia. 2. Colonic diverticulosis. 3. Marked severity dextroscoliosis of the lumbar spine with marked severity multilevel degenerative changes. 4. Aortic atherosclerosis. Aortic Atherosclerosis (ICD10-I70.0). Electronically Signed   By: Virgina Norfolk M.D.   On: 09/29/2020 01:34     Assessment and Recommendation  79 y.o. female with known diastolic dysfunction congestive heart failure coronary artery atherosclerosis anemia and paroxysmal nonvalvular atrial fibrillation having a urinary tract infection weakness and falling with no current true syncope and no evidence of rhythm disturbances causing concerns now in normal sinus rhythm with treatment of urinary tract infection 1.  Continue to monitor for atrial fibrillation sinus pauses or any other rhythm disturbances that could cause episodes of syncope or weakness.  Will consider the possibility of loop recorder placement if necessary after patient recovers from urinary tract infection 2.  Continue anticoagulation for further risk reduction in stroke with paroxysmal nonvalvular atrial fibrillation at 5 mg twice per day 3.  Discontinuation of statin  therapy at this time due to concerns of weakness fatigue and possible side effects contributing to above 4.  Okay for continued hypertension control with amlodipine 5.  No further cardiac diagnostics necessary at this time 6.  Continue treatment of urinary tract infection and possibly transition back home when improved  Signed, Serafina Royals M.D. FACC

## 2020-09-30 NOTE — Progress Notes (Signed)
PROGRESS NOTE    Melinda Maxwell  XIP:[id number] DOB: Jun 08, 1941 DOA: 09/28/2020 PCP: [name], MD   Chief complaint.  Weakness and fall Brief Narrative:  Melinda Maxwell a 79 y.o.femalewith medical history significant forCAD, diastolic CHF, paroxysmal A. fib on Xarelto, Parkinson's disease, chronic dizziness and physical deconditioning, HTN, who was brought to the hospital after 2 episodes of syncope at home with fall.  She previously had a similar episodes last year.  She was also found to have UTI, started on Rocephin.   Assessment & Plan:   Active Problems:   UTI (urinary tract infection)   CAD (coronary artery disease)   Benign essential hypertension   Paroxysmal atrial fibrillation (HCC)   Chronic diastolic CHF (congestive heart failure) (HCC)   Syncope   Hypokalemia   Type 2 diabetes mellitus without complication (HCC)   Chronic anticoagulation   Parkinson disease ([place])  #1.  Syncope and fall. Continue physical therapy and Occupational Therapy. Telemetry did not show significant arrhythmia.  2.  UTI. No evidence of sepsis or pyelonephritis.  We will give a dose of fosfomycin to complete antibiotics.  #3.  Paroxysmal atrial fibrillation with rapid ventricular response. Chronic diastolic congestive heart failure. Condition stable.  Heart rate better.  Continue anticoagulation.  No evidence of volume overload.  4.  Type 2 diabetes. Not currently treated.  5.  Parkinson disease.    DVT prophylaxis: Eliquis Code Status: Full Family Communication: None Disposition Plan:  .   Status is: Inpatient  Remains inpatient appropriate because:Unsafe d/c plan   Dispo: The patient is from: Home              Anticipated d/c is to: SNF              Anticipated d/c date is: 3 days              Patient currently is medically stable to d/c.        I/O last 3 completed shifts: In: 111.6 [I.V.:11.6; IV Piggyback:100] Out: -  Total I/O In: 3  [I.V.:3] Out: -      Consultants:   None  Procedures: None  Antimicrobials:  Fosfomycin  Subjective: Patient slept well last night.  Still has a poor appetite, but no nausea vomiting. No abdominal pain. No fever chills per   Objective: Vitals:   09/29/20 2058 09/30/20 0006 09/30/20 0412 09/30/20 0747  BP: (!) 158/81 (!) 161/69 (!) 170/93 (!) 158/76  Pulse: 61 60 61 64  Resp: 16 16 16    Temp: 97.7 F (36.5 C) 98.1 F (36.7 C) (!) 97.5 F (36.4 C) 97.9 F (36.6 C)  TempSrc:   Oral Oral  SpO2: 99% 99% 99% 100%  Weight:   57.9 kg   Height:        Intake/Output Summary (Last 24 hours) at 09/30/2020 0926 Last data filed at 09/30/2020 0916 Gross per 24 hour  Intake 114.57 ml  Output --  Net 114.57 ml   Filed Weights   09/28/20 1215 09/29/20 0951 09/30/20 0412  Weight: 59 kg 59 kg 57.9 kg    Examination:  General exam: Appears calm and comfortable  Respiratory system: Clear to auscultation. Respiratory effort normal. Cardiovascular system: S1 & S2 heard, RRR. No JVD, murmurs, rubs, gallops or clicks. No pedal edema. Gastrointestinal system: Abdomen is nondistended, soft and nontender. No organomegaly or masses felt. Normal bowel sounds heard. Central nervous system: Alert and oriented. No focal neurological deficits. Extremities: Symmetric Skin: No rashes,  lesions or ulcers Psychiatry:  Mood & affect appropriate.     Data Reviewed: I have personally reviewed following labs and imaging studies  CBC: Recent Labs  Lab 09/28/20 1221 09/29/20 0438  WBC 6.1 7.1  HGB 10.7* 10.1*  HCT 32.3* 29.6*  MCV 91.0 90.2  PLT 253 [id number]   Basic Metabolic Panel: Recent Labs  Lab 09/28/20 1221 09/29/20 0438  NA 135 136  K 2.8* 3.9  CL 96* 100  CO2 26 25  GLUCOSE 103* 98  BUN 17 18  CREATININE 0.94 0.94  CALCIUM 9.7 9.4   GFR: Estimated Creatinine Clearance: 38.4 mL/min (by C-G formula based on SCr of 0.94 mg/dL). Liver Function Tests: No results for  input(s): AST, ALT, ALKPHOS, BILITOT, PROT, ALBUMIN in the last 168 hours. No results for input(s): LIPASE, AMYLASE in the last 168 hours. No results for input(s): AMMONIA in the last 168 hours. Coagulation Profile: No results for input(s): INR, PROTIME in the last 168 hours. Cardiac Enzymes: Recent Labs  Lab 09/28/20 1221  CKTOTAL 29*   BNP (last 3 results) No results for input(s): PROBNP in the last 8760 hours. HbA1C: Recent Labs    09/29/20 0438  HGBA1C 5.4   CBG: Recent Labs  Lab 09/29/20 1338 09/29/20 1802 09/29/20 2148 09/30/20 0545 09/30/20 0713  GLUCAP 93 99 131* 85 77   Lipid Profile: No results for input(s): CHOL, HDL, LDLCALC, TRIG, CHOLHDL, LDLDIRECT in the last 72 hours. Thyroid Function Tests: No results for input(s): TSH, T4TOTAL, FREET4, T3FREE, THYROIDAB in the last 72 hours. Anemia Panel: No results for input(s): VITAMINB12, FOLATE, FERRITIN, TIBC, IRON, RETICCTPCT in the last 72 hours. Sepsis Labs: No results for input(s): PROCALCITON, LATICACIDVEN in the last 168 hours.  Recent Results (from the past 240 hour(s))  Resp Panel by RT-PCR (Flu A&B, Covid) Nasopharyngeal Swab     Status: None   Collection Time: 09/29/20 12:22 AM   Specimen: Nasopharyngeal Swab; Nasopharyngeal(NP) swabs in vial transport medium  Result Value Ref Range Status   SARS Coronavirus 2 by RT PCR NEGATIVE NEGATIVE Final    Comment: (NOTE) SARS-CoV-2 target nucleic acids are NOT DETECTED.  The SARS-CoV-2 RNA is generally detectable in upper respiratory specimens during the acute phase of infection. The lowest concentration of SARS-CoV-2 viral copies this assay can detect is 138 copies/mL. A negative result does not preclude SARS-Cov-2 infection and should not be used as the sole basis for treatment or other patient management decisions. A negative result may occur with  improper specimen collection/handling, submission of specimen other than nasopharyngeal swab, presence of  viral mutation(s) within the areas targeted by this assay, and inadequate number of viral copies(<138 copies/mL). A negative result must be combined with clinical observations, patient history, and epidemiological information. The expected result is Negative.  Fact Sheet for Patients:  EntrepreneurPulse.com.au  Fact Sheet for Healthcare Providers:  IncredibleEmployment.be  This test is no t yet approved or cleared by the [place] FDA and  has been authorized for detection and/or diagnosis of SARS-CoV-2 by FDA under an Emergency Use Authorization (EUA). This EUA will remain  in effect (meaning this test can be used) for the duration of the COVID-19 declaration under Section 564(b)(1) of the Act, 21 U.S.C.section 360bbb-3(b)(1), unless the authorization is terminated  or revoked sooner.       Influenza A by PCR NEGATIVE NEGATIVE Final   Influenza B by PCR NEGATIVE NEGATIVE Final    Comment: (NOTE) The Xpert Xpress SARS-CoV-2/FLU/RSV plus assay is intended  as an aid in the diagnosis of influenza from Nasopharyngeal swab specimens and should not be used as a sole basis for treatment. Nasal washings and aspirates are unacceptable for Xpert Xpress SARS-CoV-2/FLU/RSV testing.  Fact Sheet for Patients: BloggerCourse.com  Fact Sheet for Healthcare Providers: SeriousBroker.it  This test is not yet approved or cleared by the [place] FDA and has been authorized for detection and/or diagnosis of SARS-CoV-2 by FDA under an Emergency Use Authorization (EUA). This EUA will remain in effect (meaning this test can be used) for the duration of the COVID-19 declaration under Section 564(b)(1) of the Act, 21 U.S.C. section 360bbb-3(b)(1), unless the authorization is terminated or revoked.  Performed at [facility], [address] [place], [place] [address]          Radiology  Studies: DG Chest 2 View  Result Date: 09/28/2020 CLINICAL DATA:  Dizziness. EXAM: CHEST - 2 VIEW COMPARISON:  October 15, 2019 FINDINGS: Chronic scarring is seen within the lateral aspect of the left lung base. There is no evidence of acute infiltrate, pleural effusion or pneumothorax. The heart size and mediastinal contours are within normal limits. A chronic deformity is seen involving the proximal left humerus. Degenerative changes seen throughout the thoracic spine. IMPRESSION: Chronic left basilar scarring without acute or active cardiopulmonary disease. Electronically Signed   By: Aram Candela M.D.   On: 09/28/2020 23:52   DG Thoracic Spine 2 View  Result Date: 09/28/2020 CLINICAL DATA:  Status post fall. EXAM: THORACIC SPINE 2 VIEWS COMPARISON:  None. FINDINGS: There is no evidence of an acute thoracic spine fracture. There is mild levoscoliosis of the lower thoracic spine. Mild-to-moderate severity multilevel endplate sclerosis is seen with multilevel intervertebral disc space narrowing. No other significant bone abnormalities are identified. IMPRESSION: 1. Mild-to-moderate severity multilevel degenerative changes without an acute osseous abnormality. Electronically Signed   By: Aram Candela M.D.   On: 09/28/2020 23:53   DG Lumbar Spine Complete  Result Date: 09/28/2020 CLINICAL DATA:  Status post fall. EXAM: LUMBAR SPINE - COMPLETE 4+ VIEW COMPARISON:  None. FINDINGS: There is no evidence of an acute lumbar spine fracture. There is marked severity dextroscoliosis of the lumbar spine. There is moderate to marked severity multilevel endplate sclerosis with moderate to marked severity multilevel intervertebral disc space narrowing. Multiple radiopaque surgical clips and surgical sutures are seen within the pelvis. IMPRESSION: 1. Marked severity dextroscoliosis of the lumbar spine with moderate to marked severity multilevel degenerative disc disease. Electronically Signed   By: Aram Candela M.D.   On: 09/28/2020 23:54   CT Head Wo Contrast  Result Date: 09/29/2020 CLINICAL DATA:  Fall EXAM: CT HEAD WITHOUT CONTRAST CT CERVICAL SPINE WITHOUT CONTRAST TECHNIQUE: Multidetector CT imaging of the head and cervical spine was performed following the standard protocol without intravenous contrast. Multiplanar CT image reconstructions of the cervical spine were also generated. COMPARISON:  CT head 07/22/2019, 07/17/2019, CT head and cervical spine 07/16/2019 FINDINGS: CT HEAD FINDINGS Brain: No evidence of acute infarction, hemorrhage, hydrocephalus, extra-axial collection, visible mass lesion or mass effect. Symmetric prominence of the ventricles, cisterns and sulci compatible with parenchymal volume loss. Patchy areas of white matter hypoattenuation are most compatible with chronic microvascular angiopathy. Vascular: Atherosclerotic calcification of the carotid siphons and intradural vertebral arteries. No hyperdense vessel. Skull: No calvarial fracture or suspicious osseous lesion. No scalp swelling or hematoma. Sinuses/Orbits: Pneumatized secretions in the right maxillary and sphenoid sinuses. Additional mild pansinus mural disease and small bilateral mastoid effusions. Orbital  structures are unremarkable aside from prior lens extractions. Other: None. CT CERVICAL SPINE FINDINGS Alignment: Stabilization collar is absent at the time of examination. There is mild rightward lateral cervical flexion and slight rightward cranial rotation. Unchanged degenerative anterolisthesis C3-C5 retrolisthesis C5 on 6. No evidence of traumatic listhesis. No abnormally widened, perched or jumped facets. Normal alignment of the craniocervical articulations. Arthrosis and narrowing of the atlantodental interval but with otherwise normal alignment of the atlantoaxial articulations. Skull base and vertebrae: No acute skull base fracture. No vertebral body fracture or height loss. Normal bone mineralization. No  worrisome osseous lesions. Moderate arthrosis at the atlantodental and basion dens interval with some callus formation. Appearance is similar to prior. Multilevel cervical spondylitic changes are described below. Soft tissues and spinal canal: No pre or paravertebral fluid or swelling. No visible canal hematoma. Cervical carotid atherosclerosis at the level of the bifurcations. Airways are patent. Disc levels: Multilevel cervical spondylitic changes are present, maximal C5-C7. Slightly larger disc osteophyte complexes present C4-5 and C6-7 partially efface the ventral thecal sac with spurring, retrolisthesis and some ligamentum flavum infolding at C5-6 resulting in mild canal stenosis. Multilevel uncinate spurring facet hypertrophic changes are present resulting in mild-to-moderate multilevel neural foraminal narrowing with more moderate to severe narrowing bilaterally C5-6 as well. Upper chest: No acute abnormality in the upper chest or imaged lung apices. Other: Normal thyroid. IMPRESSION: 1. No acute intracranial abnormality. No acute scalp swelling or calvarial fracture. 2. Stable parenchymal volume loss and chronic microvascular ischemic white matter disease. 3. Pneumatized secretions in the right maxillary and sphenoid sinuses. Additional mild pansinus mural disease and small bilateral mastoid effusions. Correlate for features of acute sinusitis. 4. No acute fracture or traumatic listhesis of the cervical spine. 5. Multilevel cervical spondylitic and facet degenerative changes, maximal C5-C7, as above. 6. Cervical and intracranial atherosclerosis. Electronically Signed   By: Lovena Le M.D.   On: 09/29/2020 00:20   CT Cervical Spine Wo Contrast  Result Date: 09/29/2020 CLINICAL DATA:  Fall EXAM: CT HEAD WITHOUT CONTRAST CT CERVICAL SPINE WITHOUT CONTRAST TECHNIQUE: Multidetector CT imaging of the head and cervical spine was performed following the standard protocol without intravenous contrast.  Multiplanar CT image reconstructions of the cervical spine were also generated. COMPARISON:  CT head 07/22/2019, 07/17/2019, CT head and cervical spine 07/16/2019 FINDINGS: CT HEAD FINDINGS Brain: No evidence of acute infarction, hemorrhage, hydrocephalus, extra-axial collection, visible mass lesion or mass effect. Symmetric prominence of the ventricles, cisterns and sulci compatible with parenchymal volume loss. Patchy areas of white matter hypoattenuation are most compatible with chronic microvascular angiopathy. Vascular: Atherosclerotic calcification of the carotid siphons and intradural vertebral arteries. No hyperdense vessel. Skull: No calvarial fracture or suspicious osseous lesion. No scalp swelling or hematoma. Sinuses/Orbits: Pneumatized secretions in the right maxillary and sphenoid sinuses. Additional mild pansinus mural disease and small bilateral mastoid effusions. Orbital structures are unremarkable aside from prior lens extractions. Other: None. CT CERVICAL SPINE FINDINGS Alignment: Stabilization collar is absent at the time of examination. There is mild rightward lateral cervical flexion and slight rightward cranial rotation. Unchanged degenerative anterolisthesis C3-C5 retrolisthesis C5 on 6. No evidence of traumatic listhesis. No abnormally widened, perched or jumped facets. Normal alignment of the craniocervical articulations. Arthrosis and narrowing of the atlantodental interval but with otherwise normal alignment of the atlantoaxial articulations. Skull base and vertebrae: No acute skull base fracture. No vertebral body fracture or height loss. Normal bone mineralization. No worrisome osseous lesions. Moderate arthrosis at the atlantodental and basion dens  interval with some callus formation. Appearance is similar to prior. Multilevel cervical spondylitic changes are described below. Soft tissues and spinal canal: No pre or paravertebral fluid or swelling. No visible canal hematoma. Cervical  carotid atherosclerosis at the level of the bifurcations. Airways are patent. Disc levels: Multilevel cervical spondylitic changes are present, maximal C5-C7. Slightly larger disc osteophyte complexes present C4-5 and C6-7 partially efface the ventral thecal sac with spurring, retrolisthesis and some ligamentum flavum infolding at C5-6 resulting in mild canal stenosis. Multilevel uncinate spurring facet hypertrophic changes are present resulting in mild-to-moderate multilevel neural foraminal narrowing with more moderate to severe narrowing bilaterally C5-6 as well. Upper chest: No acute abnormality in the upper chest or imaged lung apices. Other: Normal thyroid. IMPRESSION: 1. No acute intracranial abnormality. No acute scalp swelling or calvarial fracture. 2. Stable parenchymal volume loss and chronic microvascular ischemic white matter disease. 3. Pneumatized secretions in the right maxillary and sphenoid sinuses. Additional mild pansinus mural disease and small bilateral mastoid effusions. Correlate for features of acute sinusitis. 4. No acute fracture or traumatic listhesis of the cervical spine. 5. Multilevel cervical spondylitic and facet degenerative changes, maximal C5-C7, as above. 6. Cervical and intracranial atherosclerosis. Electronically Signed   By: Lovena Le M.D.   On: 09/29/2020 00:20   CT ABDOMEN PELVIS W CONTRAST  Result Date: 09/29/2020 CLINICAL DATA:  Status post fall and dizziness. EXAM: CT ABDOMEN AND PELVIS WITH CONTRAST TECHNIQUE: Multidetector CT imaging of the abdomen and pelvis was performed using the standard protocol following bolus administration of intravenous contrast. CONTRAST:  154mL OMNIPAQUE IOHEXOL 300 MG/ML  SOLN COMPARISON:  None. FINDINGS: Lower chest: No acute abnormality. Hepatobiliary: No focal liver abnormality is seen. No gallstones, gallbladder wall thickening, or biliary dilatation. Pancreas: Unremarkable. No pancreatic ductal dilatation or surrounding  inflammatory changes. Spleen: Normal in size without focal abnormality. Adrenals/Urinary Tract: Adrenal glands are unremarkable. Kidneys are normal, without renal calculi, focal lesion, or hydronephrosis. Bladder is unremarkable. Stomach/Bowel: There is a small hiatal hernia. The appendix is surgically absent. No evidence of bowel wall thickening, distention, or inflammatory changes. Noninflamed diverticula are seen throughout the large bowel. Vascular/Lymphatic: Moderate severity aortic calcification and tortuosity. No enlarged abdominal or pelvic lymph nodes. Reproductive: Status post hysterectomy. No adnexal masses. Other: No abdominal wall hernia or abnormality. Multiple surgical clips are seen within the mid and lower left abdomen and posterior aspect of the mid to lower pelvis. No abdominopelvic ascites. Musculoskeletal: There is marked severity dextroscoliosis of the lumbar spine with marked severity multilevel degenerative changes. IMPRESSION: 1. Small hiatal hernia. 2. Colonic diverticulosis. 3. Marked severity dextroscoliosis of the lumbar spine with marked severity multilevel degenerative changes. 4. Aortic atherosclerosis. Aortic Atherosclerosis (ICD10-I70.0). Electronically Signed   By: Virgina Norfolk M.D.   On: 09/29/2020 01:34        Scheduled Meds: . allopurinol  300 mg Oral Daily  . amLODipine  5 mg Oral Daily  . apixaban  5 mg Oral BID  . aspirin  81 mg Oral Daily  . carbidopa-levodopa  1 tablet Oral QHS  . carbidopa-levodopa  2 tablet Oral TID with meals  . donepezil  10 mg Oral QHS  . fosfomycin  3 g Oral Once  . insulin aspart  0-5 Units Subcutaneous QHS  . insulin aspart  0-9 Units Subcutaneous TID WC  . nystatin   Topical TID  . simvastatin  10 mg Oral Daily  . sodium chloride flush  3 mL Intravenous Q12H   Continuous Infusions: .  sodium chloride Stopped (09/29/20 2351)     LOS: 1 day    Time spent: 26 minutes    Sharen Hones, MD [facility]   To  contact the attending provider between 7A-7P or the covering provider during after hours 7P-7A, please log into the web site [email or web address] and access using universal [place] password for that web site. If you do not have the password, please call the hospital operator.  09/30/2020, 9:26 AM

## 2020-09-30 NOTE — TOC Progression Note (Addendum)
Transition of Care [facility]) - Progression Note    Patient Details  Name: Melinda Maxwell MRN: [id number] Date of Birth: July 13, 1941  Transition [facility]) CM/SW Contact  Beverly Sessions, RN Phone Number: 09/30/2020, 10:02 AM  Clinical Narrative:    Voicemail left for daughter [name].  Currently no SNF bed offers.  Bed search extended   118pm  Bed offers received.  Awaiting return call from daughter        Expected Discharge Plan and Services                                                 Social Determinants of Health (SDOH) Interventions    Readmission Risk Interventions No flowsheet data found.

## 2020-09-30 NOTE — Progress Notes (Signed)
Initial Nutrition Assessment  DOCUMENTATION CODES:   Not applicable  INTERVENTION:   Ensure Enlive po BID, each supplement provides 350 kcal and 20 grams of protein  MVI daily   Liberalize diet   NUTRITION DIAGNOSIS:   Inadequate oral intake related to acute illness as evidenced by meal completion < 25%.  GOAL:   Patient will meet greater than or equal to 90% of their needs  MONITOR:   PO intake,Supplement acceptance,Labs,Weight trends,Skin,I & O's  REASON FOR ASSESSMENT:   Malnutrition Screening Tool    ASSESSMENT:   79 y.o. female with medical history significant for CAD, diastolic CHF, CKD III, paroxysmal A. fib on Xarelto, Parkinson's disease, chronic dizziness and physical deconditioning and HTN who is admitted s/p fall and found to have UTI.   Met with pt in room today. Pt reports fair appetite and oral intake at baseline but reports that she does not like the hospital food much. Pt reports eating a few bites of eggs for breakfast this morning and reports eating almost 100% of a piece of salmon, mashed potatoes and coleslaw for lunch. RD discussed with pt the importance of adequate intake needed to preserve lean muscle. Pt reports that does enjoy vanilla Ensure at home; RD will order this for in hospital. RD will also liberalize pt's diet as pt is not eating enough to exceed any nutrient  limits. Per chart, pt down 11lbs(11%) since January and down 8lbs(6%) since August; this is not significant. Plan is for SNF at discharge.   Medications reviewed and include: allopurinol, aspirin, insulin  Labs reviewed: Hgb 10.1(L), Hct 29.6(L)  NUTRITION - FOCUSED PHYSICAL EXAM:  Flowsheet Row Most Recent Value  Orbital Region No depletion  Upper Arm Region No depletion  Thoracic and Lumbar Region No depletion  Buccal Region No depletion  Temple Region Severe depletion  Clavicle Bone Region Severe depletion  Clavicle and Acromion Bone Region Severe depletion  Scapular Bone  Region Moderate depletion  Dorsal Hand Severe depletion  Patellar Region Moderate depletion  Anterior Thigh Region Moderate depletion  Posterior Calf Region Moderate depletion  Edema (RD Assessment) None  Hair Reviewed  Eyes Reviewed  Mouth Reviewed  Skin Reviewed  Nails Reviewed     Diet Order:   Diet Order            Diet heart healthy/carb modified Room service appropriate? Yes; Fluid consistency: Thin  Diet effective now                EDUCATION NEEDS:   Education needs have been addressed  Skin:  Skin Assessment: Reviewed RN Assessment  Last BM:  12/22  Height:   Ht Readings from Last 1 Encounters:  09/29/20 $RemoveB'5\' 1"'[id number]$  (1.549 m)    Weight:   Wt Readings from Last 1 Encounters:  09/30/20 57.9 kg    Ideal Body Weight:  47.7 kg  BMI:  Body mass index is 24.12 kg/m.  Estimated Nutritional Needs:   Kcal:  1300-1500kcal/day  Protein:  65-75g/day  Fluid:  1.2-1.4L/day  Koleen Distance MS, RD, LDN Please refer to [facility] for RD and/or RD on-call/weekend/after hours pager

## 2020-10-01 DIAGNOSIS — N3000 Acute cystitis without hematuria: Secondary | ICD-10-CM | POA: Diagnosis not present

## 2020-10-01 DIAGNOSIS — R55 Syncope and collapse: Secondary | ICD-10-CM | POA: Diagnosis not present

## 2020-10-01 DIAGNOSIS — I480 Paroxysmal atrial fibrillation: Secondary | ICD-10-CM | POA: Diagnosis not present

## 2020-10-01 LAB — GLUCOSE, CAPILLARY
Glucose-Capillary: 92 mg/dL (ref 70–99)
Glucose-Capillary: 99 mg/dL (ref 70–99)
Glucose-Capillary: 98 mg/dL (ref 70–99)
Glucose-Capillary: 96 mg/dL (ref 70–99)

## 2020-10-01 MED ORDER — METOPROLOL TARTRATE 25 MG PO TABS
25.0000 mg | ORAL_TABLET | Freq: Two times a day (BID) | ORAL | Status: DC
  Administered 2020-10-01 – 2020-10-05 (×9): 25 mg via ORAL
  Filled 2020-10-01 (×10): qty 1

## 2020-10-01 MED ORDER — METOPROLOL TARTRATE 5 MG/5ML IV SOLN
5.0000 mg | Freq: Once | INTRAVENOUS | Status: AC
  Administered 2020-10-01: 03:00:00 5 mg via INTRAVENOUS
  Filled 2020-10-01: qty 5

## 2020-10-01 NOTE — TOC Progression Note (Signed)
Transition of Care [facility]) - Progression Note    Patient Details  Name: Melinda Maxwell MRN: [id number] Date of Birth: June 10, 1941  [facility] CM/SW [place], LCSW Phone Number: 10/01/2020, 8:57 AM  Clinical Narrative:    Due to Christmas Holidays most SNF's are not making any offers. Patient and family prefer [facility], [facility], and [facility]. CSW will contact these three after holiday's.         Expected Discharge Plan and Services                                                 Social Determinants of Health (SDOH) Interventions    Readmission Risk Interventions No flowsheet data found.

## 2020-10-01 NOTE — Progress Notes (Addendum)
Informed Hospitalist re: patient back in Afib and is sustaining HR mostly in 120's to 150's. Administered ordered IV Lopressor which brought HR down to sustain mostly below 100's.

## 2020-10-01 NOTE — Plan of Care (Signed)
Continuing with plan of care. 

## 2020-10-01 NOTE — Progress Notes (Signed)
[facility] Encounter Note  Patient: Melinda Maxwell / Admit Date: 09/28/2020 / Date of Encounter: 10/01/2020, 6:11 AM   Subjective: 12/24.  Patient has rested well overnight with no further evidence of significant anginal symptoms or congestive heart failure syncope or falls.  The patient did have atrial fibrillation with rapid ventricular rate on admission but now has spontaneously converted to normal sinus rhythm with preventricular contractions.  There is no evidence of advanced heart block or significant pauses by telemetry.  The patient has had chronic diastolic dysfunction heart failure but currently has no evidence of significant symptoms of that.  Troponin levels have been 24/24 consistent with demand ischemia rather than acute coronary syndrome.  The patient does have urinary tract infection which may have influenced her weakness fatigue and falls.  12/25.  Patient rested well overnight but has not been ambulating throughout the day to assess for any weakness and fatigue and or other concerns.  With Parkinson's she can have some hypotension dizziness weakness and falls.  Additionally she has not had any rhythm disturbances causing these falls based on recent evidence as an outpatient as well is currently here as an inpatient.  Current telemetry does show that she has normal sinus rhythm with frequent preventricular contractions and some short runs of atrial fibrillation.  She is slowly recovering from urinary tract infection  Review of Systems: Positive for: Shortness of breath Negative for: Vision change, hearing change, syncope, dizziness, nausea, vomiting,diarrhea, bloody stool, stomach pain, cough, congestion, diaphoresis, urinary frequency, urinary pain,skin lesions, skin rashes Others previously listed  Objective: Telemetry: Normal sinus rhythm with preventricular contractions Physical Exam: Blood pressure (!) 158/81, pulse 73, temperature 98.7 F (37.1 C),  temperature source Oral, resp. rate 18, height 5\' 1"  (1.549 m), weight 57.9 kg, SpO2 100 %. Body mass index is 24.12 kg/m. General: Well developed, well nourished, in no acute distress. Head: Normocephalic, atraumatic, sclera non-icteric, no xanthomas, nares are without discharge. Neck: No apparent masses Lungs: Normal respirations with no wheezes, no rhonchi, no rales , no crackles   Heart: Irregular rate and rhythm, normal S1 S2, no murmur, no rub, no gallop, PMI is normal size and placement, carotid upstroke normal without bruit, jugular venous pressure normal Abdomen: Soft, non-tender, non-distended with normoactive bowel sounds. No hepatosplenomegaly. Abdominal aorta is normal size without bruit Extremities: Trace edema, no clubbing, no cyanosis, no ulcers,  Peripheral: 2+ radial, 2+ femoral, 2+ dorsal pedal pulses Neuro: Alert and oriented. Moves all extremities spontaneously. Psych:  Responds to questions appropriately with a normal affect.   Intake/Output Summary (Last 24 hours) at 10/01/2020 0611 Last data filed at 09/30/2020 1357 Gross per 24 hour  Intake 363 ml  Output 450 ml  Net -87 ml    Inpatient Medications:  . allopurinol  300 mg Oral Daily  . amLODipine  5 mg Oral Daily  . apixaban  5 mg Oral BID  . aspirin  81 mg Oral Daily  . carbidopa-levodopa  1 tablet Oral QHS  . carbidopa-levodopa  2 tablet Oral TID with meals  . donepezil  10 mg Oral QHS  . feeding supplement  237 mL Oral BID BM  . insulin aspart  0-5 Units Subcutaneous QHS  . insulin aspart  0-9 Units Subcutaneous TID WC  . metoprolol tartrate  25 mg Oral BID  . multivitamin with minerals  1 tablet Oral Daily  . nystatin   Topical TID  . pantoprazole  40 mg Oral Daily  . sodium chloride  flush  3 mL Intravenous Q12H   Infusions:  . sodium chloride Stopped (09/29/20 2351)    Labs: Recent Labs    09/28/20 1221 09/29/20 0438  NA 135 136  K 2.8* 3.9  CL 96* 100  CO2 26 25  GLUCOSE 103* 98  BUN  17 18  CREATININE 0.94 0.94  CALCIUM 9.7 9.4   No results for input(s): AST, ALT, ALKPHOS, BILITOT, PROT, ALBUMIN in the last 72 hours. Recent Labs    09/28/20 1221 09/29/20 0438  WBC 6.1 7.1  HGB 10.7* 10.1*  HCT 32.3* 29.6*  MCV 91.0 90.2  PLT 253 235   Recent Labs    09/28/20 1221  CKTOTAL 29*   Invalid input(s): POCBNP Recent Labs    09/29/20 0438  HGBA1C 5.4     Weights: Filed Weights   09/28/20 1215 09/29/20 0951 09/30/20 0412  Weight: 59 kg 59 kg 57.9 kg     Radiology/Studies:  DG Chest 2 View  Result Date: 09/28/2020 CLINICAL DATA:  Dizziness. EXAM: CHEST - 2 VIEW COMPARISON:  October 15, 2019 FINDINGS: Chronic scarring is seen within the lateral aspect of the left lung base. There is no evidence of acute infiltrate, pleural effusion or pneumothorax. The heart size and mediastinal contours are within normal limits. A chronic deformity is seen involving the proximal left humerus. Degenerative changes seen throughout the thoracic spine. IMPRESSION: Chronic left basilar scarring without acute or active cardiopulmonary disease. Electronically Signed   By: Virgina Norfolk M.D.   On: 09/28/2020 23:52   DG Thoracic Spine 2 View  Result Date: 09/28/2020 CLINICAL DATA:  Status post fall. EXAM: THORACIC SPINE 2 VIEWS COMPARISON:  None. FINDINGS: There is no evidence of an acute thoracic spine fracture. There is mild levoscoliosis of the lower thoracic spine. Mild-to-moderate severity multilevel endplate sclerosis is seen with multilevel intervertebral disc space narrowing. No other significant bone abnormalities are identified. IMPRESSION: 1. Mild-to-moderate severity multilevel degenerative changes without an acute osseous abnormality. Electronically Signed   By: Virgina Norfolk M.D.   On: 09/28/2020 23:53   DG Lumbar Spine Complete  Result Date: 09/28/2020 CLINICAL DATA:  Status post fall. EXAM: LUMBAR SPINE - COMPLETE 4+ VIEW COMPARISON:  None. FINDINGS: There is  no evidence of an acute lumbar spine fracture. There is marked severity dextroscoliosis of the lumbar spine. There is moderate to marked severity multilevel endplate sclerosis with moderate to marked severity multilevel intervertebral disc space narrowing. Multiple radiopaque surgical clips and surgical sutures are seen within the pelvis. IMPRESSION: 1. Marked severity dextroscoliosis of the lumbar spine with moderate to marked severity multilevel degenerative disc disease. Electronically Signed   By: Virgina Norfolk M.D.   On: 09/28/2020 23:54   CT Head Wo Contrast  Result Date: 09/29/2020 CLINICAL DATA:  Fall EXAM: CT HEAD WITHOUT CONTRAST CT CERVICAL SPINE WITHOUT CONTRAST TECHNIQUE: Multidetector CT imaging of the head and cervical spine was performed following the standard protocol without intravenous contrast. Multiplanar CT image reconstructions of the cervical spine were also generated. COMPARISON:  CT head 07/22/2019, 07/17/2019, CT head and cervical spine 07/16/2019 FINDINGS: CT HEAD FINDINGS Brain: No evidence of acute infarction, hemorrhage, hydrocephalus, extra-axial collection, visible mass lesion or mass effect. Symmetric prominence of the ventricles, cisterns and sulci compatible with parenchymal volume loss. Patchy areas of white matter hypoattenuation are most compatible with chronic microvascular angiopathy. Vascular: Atherosclerotic calcification of the carotid siphons and intradural vertebral arteries. No hyperdense vessel. Skull: No calvarial fracture or suspicious osseous lesion. No scalp  swelling or hematoma. Sinuses/Orbits: Pneumatized secretions in the right maxillary and sphenoid sinuses. Additional mild pansinus mural disease and small bilateral mastoid effusions. Orbital structures are unremarkable aside from prior lens extractions. Other: None. CT CERVICAL SPINE FINDINGS Alignment: Stabilization collar is absent at the time of examination. There is mild rightward lateral cervical  flexion and slight rightward cranial rotation. Unchanged degenerative anterolisthesis C3-C5 retrolisthesis C5 on 6. No evidence of traumatic listhesis. No abnormally widened, perched or jumped facets. Normal alignment of the craniocervical articulations. Arthrosis and narrowing of the atlantodental interval but with otherwise normal alignment of the atlantoaxial articulations. Skull base and vertebrae: No acute skull base fracture. No vertebral body fracture or height loss. Normal bone mineralization. No worrisome osseous lesions. Moderate arthrosis at the atlantodental and basion dens interval with some callus formation. Appearance is similar to prior. Multilevel cervical spondylitic changes are described below. Soft tissues and spinal canal: No pre or paravertebral fluid or swelling. No visible canal hematoma. Cervical carotid atherosclerosis at the level of the bifurcations. Airways are patent. Disc levels: Multilevel cervical spondylitic changes are present, maximal C5-C7. Slightly larger disc osteophyte complexes present C4-5 and C6-7 partially efface the ventral thecal sac with spurring, retrolisthesis and some ligamentum flavum infolding at C5-6 resulting in mild canal stenosis. Multilevel uncinate spurring facet hypertrophic changes are present resulting in mild-to-moderate multilevel neural foraminal narrowing with more moderate to severe narrowing bilaterally C5-6 as well. Upper chest: No acute abnormality in the upper chest or imaged lung apices. Other: Normal thyroid. IMPRESSION: 1. No acute intracranial abnormality. No acute scalp swelling or calvarial fracture. 2. Stable parenchymal volume loss and chronic microvascular ischemic white matter disease. 3. Pneumatized secretions in the right maxillary and sphenoid sinuses. Additional mild pansinus mural disease and small bilateral mastoid effusions. Correlate for features of acute sinusitis. 4. No acute fracture or traumatic listhesis of the cervical  spine. 5. Multilevel cervical spondylitic and facet degenerative changes, maximal C5-C7, as above. 6. Cervical and intracranial atherosclerosis. Electronically Signed   By: Lovena Le M.D.   On: 09/29/2020 00:20   CT Cervical Spine Wo Contrast  Result Date: 09/29/2020 CLINICAL DATA:  Fall EXAM: CT HEAD WITHOUT CONTRAST CT CERVICAL SPINE WITHOUT CONTRAST TECHNIQUE: Multidetector CT imaging of the head and cervical spine was performed following the standard protocol without intravenous contrast. Multiplanar CT image reconstructions of the cervical spine were also generated. COMPARISON:  CT head 07/22/2019, 07/17/2019, CT head and cervical spine 07/16/2019 FINDINGS: CT HEAD FINDINGS Brain: No evidence of acute infarction, hemorrhage, hydrocephalus, extra-axial collection, visible mass lesion or mass effect. Symmetric prominence of the ventricles, cisterns and sulci compatible with parenchymal volume loss. Patchy areas of white matter hypoattenuation are most compatible with chronic microvascular angiopathy. Vascular: Atherosclerotic calcification of the carotid siphons and intradural vertebral arteries. No hyperdense vessel. Skull: No calvarial fracture or suspicious osseous lesion. No scalp swelling or hematoma. Sinuses/Orbits: Pneumatized secretions in the right maxillary and sphenoid sinuses. Additional mild pansinus mural disease and small bilateral mastoid effusions. Orbital structures are unremarkable aside from prior lens extractions. Other: None. CT CERVICAL SPINE FINDINGS Alignment: Stabilization collar is absent at the time of examination. There is mild rightward lateral cervical flexion and slight rightward cranial rotation. Unchanged degenerative anterolisthesis C3-C5 retrolisthesis C5 on 6. No evidence of traumatic listhesis. No abnormally widened, perched or jumped facets. Normal alignment of the craniocervical articulations. Arthrosis and narrowing of the atlantodental interval but with otherwise  normal alignment of the atlantoaxial articulations. Skull base and vertebrae: No acute  skull base fracture. No vertebral body fracture or height loss. Normal bone mineralization. No worrisome osseous lesions. Moderate arthrosis at the atlantodental and basion dens interval with some callus formation. Appearance is similar to prior. Multilevel cervical spondylitic changes are described below. Soft tissues and spinal canal: No pre or paravertebral fluid or swelling. No visible canal hematoma. Cervical carotid atherosclerosis at the level of the bifurcations. Airways are patent. Disc levels: Multilevel cervical spondylitic changes are present, maximal C5-C7. Slightly larger disc osteophyte complexes present C4-5 and C6-7 partially efface the ventral thecal sac with spurring, retrolisthesis and some ligamentum flavum infolding at C5-6 resulting in mild canal stenosis. Multilevel uncinate spurring facet hypertrophic changes are present resulting in mild-to-moderate multilevel neural foraminal narrowing with more moderate to severe narrowing bilaterally C5-6 as well. Upper chest: No acute abnormality in the upper chest or imaged lung apices. Other: Normal thyroid. IMPRESSION: 1. No acute intracranial abnormality. No acute scalp swelling or calvarial fracture. 2. Stable parenchymal volume loss and chronic microvascular ischemic white matter disease. 3. Pneumatized secretions in the right maxillary and sphenoid sinuses. Additional mild pansinus mural disease and small bilateral mastoid effusions. Correlate for features of acute sinusitis. 4. No acute fracture or traumatic listhesis of the cervical spine. 5. Multilevel cervical spondylitic and facet degenerative changes, maximal C5-C7, as above. 6. Cervical and intracranial atherosclerosis. Electronically Signed   By: Lovena Le M.D.   On: 09/29/2020 00:20   CT ABDOMEN PELVIS W CONTRAST  Result Date: 09/29/2020 CLINICAL DATA:  Status post fall and dizziness. EXAM: CT  ABDOMEN AND PELVIS WITH CONTRAST TECHNIQUE: Multidetector CT imaging of the abdomen and pelvis was performed using the standard protocol following bolus administration of intravenous contrast. CONTRAST:  127mL OMNIPAQUE IOHEXOL 300 MG/ML  SOLN COMPARISON:  None. FINDINGS: Lower chest: No acute abnormality. Hepatobiliary: No focal liver abnormality is seen. No gallstones, gallbladder wall thickening, or biliary dilatation. Pancreas: Unremarkable. No pancreatic ductal dilatation or surrounding inflammatory changes. Spleen: Normal in size without focal abnormality. Adrenals/Urinary Tract: Adrenal glands are unremarkable. Kidneys are normal, without renal calculi, focal lesion, or hydronephrosis. Bladder is unremarkable. Stomach/Bowel: There is a small hiatal hernia. The appendix is surgically absent. No evidence of bowel wall thickening, distention, or inflammatory changes. Noninflamed diverticula are seen throughout the large bowel. Vascular/Lymphatic: Moderate severity aortic calcification and tortuosity. No enlarged abdominal or pelvic lymph nodes. Reproductive: Status post hysterectomy. No adnexal masses. Other: No abdominal wall hernia or abnormality. Multiple surgical clips are seen within the mid and lower left abdomen and posterior aspect of the mid to lower pelvis. No abdominopelvic ascites. Musculoskeletal: There is marked severity dextroscoliosis of the lumbar spine with marked severity multilevel degenerative changes. IMPRESSION: 1. Small hiatal hernia. 2. Colonic diverticulosis. 3. Marked severity dextroscoliosis of the lumbar spine with marked severity multilevel degenerative changes. 4. Aortic atherosclerosis. Aortic Atherosclerosis (ICD10-I70.0). Electronically Signed   By: Virgina Norfolk M.D.   On: 09/29/2020 01:34     Assessment and Recommendation  79 y.o. female with known diastolic dysfunction congestive heart failure coronary artery atherosclerosis anemia and paroxysmal nonvalvular atrial  fibrillation having a urinary tract infection weakness and falling with no current true syncope and no evidence of rhythm disturbances causing concerns with the possibility of contribution from Parkinson's now in normal sinus rhythm with treatment of urinary tract infection 1.  Continue to monitor for atrial fibrillation sinus pauses or any other rhythm disturbances that could cause episodes of syncope or weakness.  We will continue metoprolol at this time for  maintenance of normal sinus rhythm will consider the possibility of loop recorder placement if necessary after patient recovers from urinary tract infection 2.  Continue anticoagulation for further risk reduction in stroke with paroxysmal nonvalvular atrial fibrillation at 5 mg twice per day 3.  Have discontinued of statin therapy at this time due to concerns of weakness fatigue and possible side effects contributing to above 4.  Okay for continued hypertension control with amlodipine although will consider the discontinuation if there is lower blood pressure 5.  No further cardiac diagnostics necessary at this time 6.  Continue treatment of urinary tract infection and possibly transition back home when improved  Signed, Serafina Royals M.D. FACC

## 2020-10-01 NOTE — Progress Notes (Signed)
PROGRESS NOTE    Melinda Maxwell  XNA:[id number] DOB: [birth year] DOA: 09/28/2020 PCP: [name], MD   Chief complaint.  General weakness and fall. Brief Narrative:  Melinda Maxwell a 79 y.o.femalewith medical history significant forCAD, diastolic CHF, paroxysmal A. fib on Xarelto, Parkinson's disease, chronic dizziness and physical deconditioning, HTN,who was brought to the hospital after 2 episodes of syncope at homewithfall. She previously had a similar episodes last year. She was also found to have UTI, started on Rocephin. Patient was given a dose of fosfomycin on 12/24 to complete antibiotics.   Assessment & Plan:   Active Problems:   UTI (urinary tract infection)   CAD (coronary artery disease)   Benign essential hypertension   Paroxysmal atrial fibrillation (HCC)   Chronic diastolic CHF (congestive heart failure) (HCC)   Syncope   Hypokalemia   Type 2 diabetes mellitus without complication (HCC)   Chronic anticoagulation   Parkinson disease ([place])  #1.  Syncope and fall. No arrhythmia on telemetry. Continue physical therapy occupational therapy. Pending SNF placement.  2.  UTI.  Completed antibiotics.  3.  Paroxysmal defibrillation with rapid ventricular response. Chronic diastolic congestive heart failure. Patient heart rate is under control, she is on anticoagulation.  No evidence of congestive heart failure exacerbation.  4.  Type 2 diabetes. No need for treatment at this time.  5.  Parkinson disease.    DVT prophylaxis: Eliquis Code Status: Full Family Communication: None Disposition Plan:  .   Status is: Inpatient  Remains inpatient appropriate because:Unsafe d/c plan   Dispo: The patient is from: Home              Anticipated d/c is to: Home              Anticipated d/c date is: 2 days              Patient currently is medically stable to d/c.        I/O last 3 completed shifts: In: 474.6 [P.O.:360; I.V.:14.6; IV  Piggyback:100] Out: 650 [Urine:650] No intake/output data recorded.     Consultants:   Cardiology  Procedures: None  Antimicrobials: None  Subjective: Patient slept well last night.  Appetite is gradually improving.  No nausea vomiting abdominal pain. Denies any short of breath or cough. No fever or chills. No dysuria or hematuria.  Objective: Vitals:   09/30/20 1213 09/30/20 1453 09/30/20 2028 10/01/20 0417  BP: (!) 164/67 139/66 (!) 159/77 (!) 158/81  Pulse: (!) 52 73 75 73  Resp: 19 17 16 18   Temp: (!) 97.4 F (36.3 C) 98 F (36.7 C) 99.2 F (37.3 C) 98.7 F (37.1 C)  TempSrc:   Oral Oral  SpO2: 98% 98% 98% 100%  Weight:      Height:        Intake/Output Summary (Last 24 hours) at 10/01/2020 0857 Last data filed at 10/01/2020 5427 Gross per 24 hour  Intake 363 ml  Output 650 ml  Net -287 ml   Filed Weights   09/28/20 1215 09/29/20 0951 09/30/20 0412  Weight: 59 kg 59 kg 57.9 kg    Examination:  General exam: Appears calm and comfortable  Respiratory system: Clear to auscultation. Respiratory effort normal. Cardiovascular system: S1 & S2 heard, RRR. No JVD, murmurs, rubs, gallops or clicks. No pedal edema. Gastrointestinal system: Abdomen is nondistended, soft and nontender. No organomegaly or masses felt. Normal bowel sounds heard. Central nervous system: Alert and oriented x3. No focal neurological deficits.  Extremities: Symmetric 5 x 5 power. Skin: No rashes, lesions or ulcers Psychiatry:  Mood & affect appropriate.     Data Reviewed: I have personally reviewed following labs and imaging studies  CBC: Recent Labs  Lab 09/28/20 1221 09/29/20 0438  WBC 6.1 7.1  HGB 10.7* 10.1*  HCT 32.3* 29.6*  MCV 91.0 90.2  PLT 253 [id number]   Basic Metabolic Panel: Recent Labs  Lab 09/28/20 1221 09/29/20 0438  NA 135 136  K 2.8* 3.9  CL 96* 100  CO2 26 25  GLUCOSE 103* 98  BUN 17 18  CREATININE 0.94 0.94  CALCIUM 9.7 9.4   GFR: Estimated  Creatinine Clearance: 38.4 mL/min (by C-G formula based on SCr of 0.94 mg/dL). Liver Function Tests: No results for input(s): AST, ALT, ALKPHOS, BILITOT, PROT, ALBUMIN in the last 168 hours. No results for input(s): LIPASE, AMYLASE in the last 168 hours. No results for input(s): AMMONIA in the last 168 hours. Coagulation Profile: No results for input(s): INR, PROTIME in the last 168 hours. Cardiac Enzymes: Recent Labs  Lab 09/28/20 1221  CKTOTAL 29*   BNP (last 3 results) No results for input(s): PROBNP in the last 8760 hours. HbA1C: Recent Labs    09/29/20 0438  HGBA1C 5.4   CBG: Recent Labs  Lab 09/30/20 0713 09/30/20 1223 09/30/20 1454 09/30/20 2052 10/01/20 0800  GLUCAP 77 87 116* 96 92   Lipid Profile: No results for input(s): CHOL, HDL, LDLCALC, TRIG, CHOLHDL, LDLDIRECT in the last 72 hours. Thyroid Function Tests: No results for input(s): TSH, T4TOTAL, FREET4, T3FREE, THYROIDAB in the last 72 hours. Anemia Panel: No results for input(s): VITAMINB12, FOLATE, FERRITIN, TIBC, IRON, RETICCTPCT in the last 72 hours. Sepsis Labs: No results for input(s): PROCALCITON, LATICACIDVEN in the last 168 hours.  Recent Results (from the past 240 hour(s))  Resp Panel by RT-PCR (Flu A&B, Covid) Nasopharyngeal Swab     Status: None   Collection Time: 09/29/20 12:22 AM   Specimen: Nasopharyngeal Swab; Nasopharyngeal(NP) swabs in vial transport medium  Result Value Ref Range Status   SARS Coronavirus 2 by RT PCR NEGATIVE NEGATIVE Final    Comment: (NOTE) SARS-CoV-2 target nucleic acids are NOT DETECTED.  The SARS-CoV-2 RNA is generally detectable in upper respiratory specimens during the acute phase of infection. The lowest concentration of SARS-CoV-2 viral copies this assay can detect is 138 copies/mL. A negative result does not preclude SARS-Cov-2 infection and should not be used as the sole basis for treatment or other patient management decisions. A negative result may  occur with  improper specimen collection/handling, submission of specimen other than nasopharyngeal swab, presence of viral mutation(s) within the areas targeted by this assay, and inadequate number of viral copies(<138 copies/mL). A negative result must be combined with clinical observations, patient history, and epidemiological information. The expected result is Negative.  Fact Sheet for Patients:  EntrepreneurPulse.com.au  Fact Sheet for Healthcare Providers:  IncredibleEmployment.be  This test is no t yet approved or cleared by the [place] FDA and  has been authorized for detection and/or diagnosis of SARS-CoV-2 by FDA under an Emergency Use Authorization (EUA). This EUA will remain  in effect (meaning this test can be used) for the duration of the COVID-19 declaration under Section 564(b)(1) of the Act, 21 U.S.C.section 360bbb-3(b)(1), unless the authorization is terminated  or revoked sooner.       Influenza A by PCR NEGATIVE NEGATIVE Final   Influenza B by PCR NEGATIVE NEGATIVE Final    Comment: (  NOTE) The Xpert Xpress SARS-CoV-2/FLU/RSV plus assay is intended as an aid in the diagnosis of influenza from Nasopharyngeal swab specimens and should not be used as a sole basis for treatment. Nasal washings and aspirates are unacceptable for Xpert Xpress SARS-CoV-2/FLU/RSV testing.  Fact Sheet for Patients: EntrepreneurPulse.com.au  Fact Sheet for Healthcare Providers: IncredibleEmployment.be  This test is not yet approved or cleared by the [place] FDA and has been authorized for detection and/or diagnosis of SARS-CoV-2 by FDA under an Emergency Use Authorization (EUA). This EUA will remain in effect (meaning this test can be used) for the duration of the COVID-19 declaration under Section 564(b)(1) of the Act, 21 U.S.C. section 360bbb-3(b)(1), unless the authorization is terminated  or revoked.  Performed at [facility], [address] [place], [place] [address]          Radiology Studies: No results found.      Scheduled Meds: . allopurinol  300 mg Oral Daily  . amLODipine  5 mg Oral Daily  . apixaban  5 mg Oral BID  . aspirin  81 mg Oral Daily  . carbidopa-levodopa  1 tablet Oral QHS  . carbidopa-levodopa  2 tablet Oral TID with meals  . donepezil  10 mg Oral QHS  . feeding supplement  237 mL Oral BID BM  . insulin aspart  0-5 Units Subcutaneous QHS  . insulin aspart  0-9 Units Subcutaneous TID WC  . metoprolol tartrate  25 mg Oral BID  . multivitamin with minerals  1 tablet Oral Daily  . nystatin   Topical TID  . pantoprazole  40 mg Oral Daily  . sodium chloride flush  3 mL Intravenous Q12H   Continuous Infusions: . sodium chloride Stopped (09/29/20 2351)     LOS: 2 days    Time spent: 26 minutes    Sharen Hones, MD [facility]   To contact the attending provider between 7A-7P or the covering provider during after hours 7P-7A, please log into the web site [email or web address] and access using universal [place] password for that web site. If you do not have the password, please call the hospital operator.  10/01/2020, 8:57 AM

## 2020-10-02 DIAGNOSIS — R55 Syncope and collapse: Secondary | ICD-10-CM | POA: Diagnosis not present

## 2020-10-02 DIAGNOSIS — G20 Parkinson's disease: Secondary | ICD-10-CM | POA: Diagnosis not present

## 2020-10-02 DIAGNOSIS — I480 Paroxysmal atrial fibrillation: Secondary | ICD-10-CM | POA: Diagnosis not present

## 2020-10-02 DIAGNOSIS — I5032 Chronic diastolic (congestive) heart failure: Secondary | ICD-10-CM

## 2020-10-02 LAB — GLUCOSE, CAPILLARY
Glucose-Capillary: 78 mg/dL (ref 70–99)
Glucose-Capillary: 137 mg/dL — ABNORMAL HIGH (ref 70–99)
Glucose-Capillary: 104 mg/dL — ABNORMAL HIGH (ref 70–99)
Glucose-Capillary: 122 mg/dL — ABNORMAL HIGH (ref 70–99)

## 2020-10-02 MED ORDER — SENNOSIDES-DOCUSATE SODIUM 8.6-50 MG PO TABS
2.0000 | ORAL_TABLET | Freq: Two times a day (BID) | ORAL | Status: DC
  Administered 2020-10-02 – 2020-10-06 (×8): 2 via ORAL
  Filled 2020-10-02 (×8): qty 2

## 2020-10-02 NOTE — Plan of Care (Signed)
Continuing with plan of care. 

## 2020-10-02 NOTE — Progress Notes (Addendum)
PROGRESS NOTE    Melinda Maxwell  TJQ:[id number] DOB: August 08, 1941 DOA: 09/28/2020 PCP: [name], MD   Chief complaint.  Generalized weakness. Brief Narrative:  Melinda Maxwell a 79 y.o.femalewith medical history significant forCAD, diastolic CHF, paroxysmal A. fib on Xarelto, Parkinson's disease, chronic dizziness and physical deconditioning, HTN,who was brought to the hospital after 2 episodes of syncope at homewithfall. She previously had a similar episodes last year. She was also found to have UTI, started on Rocephin. Patient was given a dose of fosfomycin on 12/24 to complete antibiotics.   Assessment & Plan:   Active Problems:   UTI (urinary tract infection)   CAD (coronary artery disease)   Benign essential hypertension   Paroxysmal atrial fibrillation (HCC)   Chronic diastolic CHF (congestive heart failure) (HCC)   Syncope   Hypokalemia   Type 2 diabetes mellitus without complication (HCC)   Chronic anticoagulation   Parkinson disease ([place])  #1.  Syncope. No arrhythmia on telemetry.  Appreciate cardiology consult. Continue physical therapy and Occupational Therapy.  2.  UTI. Completed antibiotics.  3.  Paroxysmal atrial fibrillation with rapid ventricle response. Chronic diastolic congestive heart failure. Conditions are stable.  Patient is followed by cardiology.  4.  Type 2 diabetes.  5.  Parkinson's disease. Continue physical therapy and Occupational Therapy.  6. Nonsustained VT. Patient had 2 episodes of Vt with 4 and 6 beats. Will follow.   DVT prophylaxis: Eliquis Code Status: Full Family Communication: None Disposition Plan:     Status is: Inpatient  Remains inpatient appropriate because:Unsafe d/c plan   Dispo: The patient is from: Home  Anticipated d/c is to: Home  Anticipated d/c date is: 2 days  Patient currently is medically stable to d/c.         I/O last 3 completed  shifts: In: 240 [P.O.:240] Out: 600 [Urine:600] No intake/output data recorded.     Consultants:   csriology  Procedures: None  Antimicrobials: None  Subjective: Doing well.  She slept well last night.  Appetite is still poor, no nausea vomiting abdominal pain. Not short of breath or cough. No fever or chills. No chest pain or palpitation.  Objective: Vitals:   10/01/20 1653 10/01/20 2126 10/02/20 0430 10/02/20 0800  BP: 128/76 116/73 136/72   Pulse: 69 91 (!) 51   Resp: 14 20 20    Temp: 97.9 F (36.6 C) 98.3 F (36.8 C) 98.7 F (37.1 C) 98.5 F (36.9 C)  TempSrc: Oral Oral Oral Oral  SpO2: 99% 98% 100% 100%  Weight:      Height:        Intake/Output Summary (Last 24 hours) at 10/02/2020 1041 Last data filed at 10/01/2020 2127 Gross per 24 hour  Intake 240 ml  Output 400 ml  Net -160 ml   Filed Weights   09/28/20 1215 09/29/20 0951 09/30/20 0412  Weight: 59 kg 59 kg 57.9 kg    Examination:  General exam: Appears calm and comfortable  Respiratory system: Clear to auscultation. Respiratory effort normal. Cardiovascular system: S1 & S2 heard, RRR. No JVD, murmurs, rubs, gallops or clicks. No pedal edema. Gastrointestinal system: Abdomen is nondistended, soft and nontender. No organomegaly or masses felt. Normal bowel sounds heard. Central nervous system: Alert and oriented x3. No focal neurological deficits. Extremities: Symmetric 5 x 5 power. Skin: No rashes, lesions or ulcers Psychiatry:  Mood & affect appropriate.     Data Reviewed: I have personally reviewed following labs and imaging studies  CBC: Recent  Labs  Lab 09/28/20 1221 09/29/20 0438  WBC 6.1 7.1  HGB 10.7* 10.1*  HCT 32.3* 29.6*  MCV 91.0 90.2  PLT 253 235   Basic Metabolic Panel: Recent Labs  Lab 09/28/20 1221 09/29/20 0438  NA 135 136  K 2.8* 3.9  CL 96* 100  CO2 26 25  GLUCOSE 103* 98  BUN 17 18  CREATININE 0.94 0.94  CALCIUM 9.7 9.4   GFR: Estimated Creatinine  Clearance: 38.4 mL/min (by C-G formula based on SCr of 0.94 mg/dL). Liver Function Tests: No results for input(s): AST, ALT, ALKPHOS, BILITOT, PROT, ALBUMIN in the last 168 hours. No results for input(s): LIPASE, AMYLASE in the last 168 hours. No results for input(s): AMMONIA in the last 168 hours. Coagulation Profile: No results for input(s): INR, PROTIME in the last 168 hours. Cardiac Enzymes: Recent Labs  Lab 09/28/20 1221  CKTOTAL 29*   BNP (last 3 results) No results for input(s): PROBNP in the last 8760 hours. HbA1C: No results for input(s): HGBA1C in the last 72 hours. CBG: Recent Labs  Lab 10/01/20 0800 10/01/20 1141 10/01/20 1649 10/01/20 2228 10/02/20 0758  GLUCAP 92 99 98 96 78   Lipid Profile: No results for input(s): CHOL, HDL, LDLCALC, TRIG, CHOLHDL, LDLDIRECT in the last 72 hours. Thyroid Function Tests: No results for input(s): TSH, T4TOTAL, FREET4, T3FREE, THYROIDAB in the last 72 hours. Anemia Panel: No results for input(s): VITAMINB12, FOLATE, FERRITIN, TIBC, IRON, RETICCTPCT in the last 72 hours. Sepsis Labs: No results for input(s): PROCALCITON, LATICACIDVEN in the last 168 hours.  Recent Results (from the past 240 hour(s))  Resp Panel by RT-PCR (Flu A&B, Covid) Nasopharyngeal Swab     Status: None   Collection Time: 09/29/20 12:22 AM   Specimen: Nasopharyngeal Swab; Nasopharyngeal(NP) swabs in vial transport medium  Result Value Ref Range Status   SARS Coronavirus 2 by RT PCR NEGATIVE NEGATIVE Final    Comment: (NOTE) SARS-CoV-2 target nucleic acids are NOT DETECTED.  The SARS-CoV-2 RNA is generally detectable in upper respiratory specimens during the acute phase of infection. The lowest concentration of SARS-CoV-2 viral copies this assay can detect is 138 copies/mL. A negative result does not preclude SARS-Cov-2 infection and should not be used as the sole basis for treatment or other patient management decisions. A negative result may occur  with  improper specimen collection/handling, submission of specimen other than nasopharyngeal swab, presence of viral mutation(s) within the areas targeted by this assay, and inadequate number of viral copies(<138 copies/mL). A negative result must be combined with clinical observations, patient history, and epidemiological information. The expected result is Negative.  Fact Sheet for Patients:  BloggerCourse.com  Fact Sheet for Healthcare Providers:  SeriousBroker.it  This test is no t yet approved or cleared by the [place] FDA and  has been authorized for detection and/or diagnosis of SARS-CoV-2 by FDA under an Emergency Use Authorization (EUA). This EUA will remain  in effect (meaning this test can be used) for the duration of the COVID-19 declaration under Section 564(b)(1) of the Act, 21 U.S.C.section 360bbb-3(b)(1), unless the authorization is terminated  or revoked sooner.       Influenza A by PCR NEGATIVE NEGATIVE Final   Influenza B by PCR NEGATIVE NEGATIVE Final    Comment: (NOTE) The Xpert Xpress SARS-CoV-2/FLU/RSV plus assay is intended as an aid in the diagnosis of influenza from Nasopharyngeal swab specimens and should not be used as a sole basis for treatment. Nasal washings and aspirates are unacceptable  for Xpert Xpress SARS-CoV-2/FLU/RSV testing.  Fact Sheet for Patients: EntrepreneurPulse.com.au  Fact Sheet for Healthcare Providers: IncredibleEmployment.be  This test is not yet approved or cleared by the [place] FDA and has been authorized for detection and/or diagnosis of SARS-CoV-2 by FDA under an Emergency Use Authorization (EUA). This EUA will remain in effect (meaning this test can be used) for the duration of the COVID-19 declaration under Section 564(b)(1) of the Act, 21 U.S.C. section 360bbb-3(b)(1), unless the authorization is terminated  or revoked.  Performed at [facility], [address], [place], [place] [address]          Radiology Studies: No results found.      Scheduled Meds: . allopurinol  300 mg Oral Daily  . amLODipine  5 mg Oral Daily  . aspirin  81 mg Oral Daily  . carbidopa-levodopa  1 tablet Oral QHS  . carbidopa-levodopa  2 tablet Oral TID with meals  . donepezil  10 mg Oral QHS  . feeding supplement  237 mL Oral BID BM  . insulin aspart  0-5 Units Subcutaneous QHS  . insulin aspart  0-9 Units Subcutaneous TID WC  . metoprolol tartrate  25 mg Oral BID  . multivitamin with minerals  1 tablet Oral Daily  . nystatin   Topical TID  . pantoprazole  40 mg Oral Daily  . sodium chloride flush  3 mL Intravenous Q12H   Continuous Infusions: . sodium chloride Stopped (09/29/20 2351)     LOS: 3 days    Time spent: 26 minutes    Sharen Hones, MD [facility]   To contact the attending provider between 7A-7P or the covering provider during after hours 7P-7A, please log into the web site [email or web address] and access using universal [place] password for that web site. If you do not have the password, please call the hospital operator.  10/02/2020, 10:41 AM

## 2020-10-02 NOTE — Progress Notes (Signed)
[facility] Cardiology [facility] Encounter Note  Patient: Melinda Maxwell / Admit Date: 09/28/2020 / Date of Encounter: 10/02/2020, 8:43 AM   Subjective: 12/24.  Patient has rested well overnight with no further evidence of significant anginal symptoms or congestive heart failure syncope or falls.  The patient did have atrial fibrillation with rapid ventricular rate on admission but now has spontaneously converted to normal sinus rhythm with preventricular contractions.  There is no evidence of advanced heart block or significant pauses by telemetry.  The patient has had chronic diastolic dysfunction heart failure but currently has no evidence of significant symptoms of that.  Troponin levels have been 24/24 consistent with demand ischemia rather than acute coronary syndrome.  The patient does have urinary tract infection which may have influenced her weakness fatigue and falls.  12/25.  Patient rested well overnight but has not been ambulating throughout the day to assess for any weakness and fatigue and or other concerns.  With Parkinson's she can have some hypotension dizziness weakness and falls.  Additionally she has not had any rhythm disturbances causing these falls based on recent evidence as an outpatient as well is currently here as an inpatient.  Current telemetry does show that she has normal sinus rhythm with frequent preventricular contractions and some short runs of atrial fibrillation.  She is slowly recovering from urinary tract infection  12/26.  Patient overall has felt fairly well since admission to the hospital with no evidence of further significant PND orthopnea syncope dizziness nausea or diaphoresis.  Patient has had episodes of atrial fibrillation with more rapid rate requiring metoprolol use as well as spontaneous conversion to normal sinus rhythm.  Additionally there is some longer RR intervals and longer pauses at least up to 2.6 seconds.  This is most consistent with sick  sinus syndrome which may be contributing to her current issues of weakness fatigue and syncope.  We will need further discussion of the possibility of pacemaker placement including pacemaker with a Micra.  Will consider this a possibility after further adjustments of medication management and discontinuation of anticoagulation for further risk reduction in bleeding complications with procedure.  Review of Systems: Positive for: Shortness of breath Negative for: Vision change, hearing change, syncope, dizziness, nausea, vomiting,diarrhea, bloody stool, stomach pain, cough, congestion, diaphoresis, urinary frequency, urinary pain,skin lesions, skin rashes Others previously listed  Objective: Telemetry: Normal sinus rhythm with preventricular contractions Physical Exam: Blood pressure 136/72, pulse (!) 51, temperature 98.5 F (36.9 C), temperature source Oral, resp. rate 20, height 5\' 1"  (1.549 m), weight 57.9 kg, SpO2 100 %. Body mass index is 24.12 kg/m. General: Well developed, well nourished, in no acute distress. Head: Normocephalic, atraumatic, sclera non-icteric, no xanthomas, nares are without discharge. Neck: No apparent masses Lungs: Normal respirations with no wheezes, no rhonchi, no rales , no crackles   Heart: Irregular rate and rhythm, normal S1 S2, no murmur, no rub, no gallop, PMI is normal size and placement, carotid upstroke normal without bruit, jugular venous pressure normal Abdomen: Soft, non-tender, non-distended with normoactive bowel sounds. No hepatosplenomegaly. Abdominal aorta is normal size without bruit Extremities: Trace edema, no clubbing, no cyanosis, no ulcers,  Peripheral: 2+ radial, 2+ femoral, 2+ dorsal pedal pulses Neuro: Alert and oriented. Moves all extremities spontaneously. Psych:  Responds to questions appropriately with a normal affect.   Intake/Output Summary (Last 24 hours) at 10/02/2020 0843 Last data filed at 10/01/2020 2127 Gross per 24 hour   Intake 240 ml  Output 400 ml  Net -160 ml    Inpatient Medications:  . allopurinol  300 mg Oral Daily  . amLODipine  5 mg Oral Daily  . apixaban  5 mg Oral BID  . aspirin  81 mg Oral Daily  . carbidopa-levodopa  1 tablet Oral QHS  . carbidopa-levodopa  2 tablet Oral TID with meals  . donepezil  10 mg Oral QHS  . feeding supplement  237 mL Oral BID BM  . insulin aspart  0-5 Units Subcutaneous QHS  . insulin aspart  0-9 Units Subcutaneous TID WC  . metoprolol tartrate  25 mg Oral BID  . multivitamin with minerals  1 tablet Oral Daily  . nystatin   Topical TID  . pantoprazole  40 mg Oral Daily  . sodium chloride flush  3 mL Intravenous Q12H   Infusions:  . sodium chloride Stopped (09/29/20 2351)    Labs: No results for input(s): NA, K, CL, CO2, GLUCOSE, BUN, CREATININE, CALCIUM, MG, PHOS in the last 72 hours. No results for input(s): AST, ALT, ALKPHOS, BILITOT, PROT, ALBUMIN in the last 72 hours. No results for input(s): WBC, NEUTROABS, HGB, HCT, MCV, PLT in the last 72 hours. No results for input(s): CKTOTAL, CKMB, TROPONINI in the last 72 hours. Invalid input(s): POCBNP No results for input(s): HGBA1C in the last 72 hours.   Weights: Filed Weights   09/28/20 1215 09/29/20 0951 09/30/20 0412  Weight: 59 kg 59 kg 57.9 kg     Radiology/Studies:  DG Chest 2 View  Result Date: 09/28/2020 CLINICAL DATA:  Dizziness. EXAM: CHEST - 2 VIEW COMPARISON:  October 15, 2019 FINDINGS: Chronic scarring is seen within the lateral aspect of the left lung base. There is no evidence of acute infiltrate, pleural effusion or pneumothorax. The heart size and mediastinal contours are within normal limits. A chronic deformity is seen involving the proximal left humerus. Degenerative changes seen throughout the thoracic spine. IMPRESSION: Chronic left basilar scarring without acute or active cardiopulmonary disease. Electronically Signed   By: Aram Candela M.D.   On: 09/28/2020 23:52   DG  Thoracic Spine 2 View  Result Date: 09/28/2020 CLINICAL DATA:  Status post fall. EXAM: THORACIC SPINE 2 VIEWS COMPARISON:  None. FINDINGS: There is no evidence of an acute thoracic spine fracture. There is mild levoscoliosis of the lower thoracic spine. Mild-to-moderate severity multilevel endplate sclerosis is seen with multilevel intervertebral disc space narrowing. No other significant bone abnormalities are identified. IMPRESSION: 1. Mild-to-moderate severity multilevel degenerative changes without an acute osseous abnormality. Electronically Signed   By: Aram Candela M.D.   On: 09/28/2020 23:53   DG Lumbar Spine Complete  Result Date: 09/28/2020 CLINICAL DATA:  Status post fall. EXAM: LUMBAR SPINE - COMPLETE 4+ VIEW COMPARISON:  None. FINDINGS: There is no evidence of an acute lumbar spine fracture. There is marked severity dextroscoliosis of the lumbar spine. There is moderate to marked severity multilevel endplate sclerosis with moderate to marked severity multilevel intervertebral disc space narrowing. Multiple radiopaque surgical clips and surgical sutures are seen within the pelvis. IMPRESSION: 1. Marked severity dextroscoliosis of the lumbar spine with moderate to marked severity multilevel degenerative disc disease. Electronically Signed   By: Aram Candela M.D.   On: 09/28/2020 23:54   CT Head Wo Contrast  Result Date: 09/29/2020 CLINICAL DATA:  Fall EXAM: CT HEAD WITHOUT CONTRAST CT CERVICAL SPINE WITHOUT CONTRAST TECHNIQUE: Multidetector CT imaging of the head and cervical spine was performed following the standard protocol without intravenous contrast. Multiplanar CT image  reconstructions of the cervical spine were also generated. COMPARISON:  CT head 07/22/2019, 07/17/2019, CT head and cervical spine 07/16/2019 FINDINGS: CT HEAD FINDINGS Brain: No evidence of acute infarction, hemorrhage, hydrocephalus, extra-axial collection, visible mass lesion or mass effect. Symmetric  prominence of the ventricles, cisterns and sulci compatible with parenchymal volume loss. Patchy areas of white matter hypoattenuation are most compatible with chronic microvascular angiopathy. Vascular: Atherosclerotic calcification of the carotid siphons and intradural vertebral arteries. No hyperdense vessel. Skull: No calvarial fracture or suspicious osseous lesion. No scalp swelling or hematoma. Sinuses/Orbits: Pneumatized secretions in the right maxillary and sphenoid sinuses. Additional mild pansinus mural disease and small bilateral mastoid effusions. Orbital structures are unremarkable aside from prior lens extractions. Other: None. CT CERVICAL SPINE FINDINGS Alignment: Stabilization collar is absent at the time of examination. There is mild rightward lateral cervical flexion and slight rightward cranial rotation. Unchanged degenerative anterolisthesis C3-C5 retrolisthesis C5 on 6. No evidence of traumatic listhesis. No abnormally widened, perched or jumped facets. Normal alignment of the craniocervical articulations. Arthrosis and narrowing of the atlantodental interval but with otherwise normal alignment of the atlantoaxial articulations. Skull base and vertebrae: No acute skull base fracture. No vertebral body fracture or height loss. Normal bone mineralization. No worrisome osseous lesions. Moderate arthrosis at the atlantodental and basion dens interval with some callus formation. Appearance is similar to prior. Multilevel cervical spondylitic changes are described below. Soft tissues and spinal canal: No pre or paravertebral fluid or swelling. No visible canal hematoma. Cervical carotid atherosclerosis at the level of the bifurcations. Airways are patent. Disc levels: Multilevel cervical spondylitic changes are present, maximal C5-C7. Slightly larger disc osteophyte complexes present C4-5 and C6-7 partially efface the ventral thecal sac with spurring, retrolisthesis and some ligamentum flavum  infolding at C5-6 resulting in mild canal stenosis. Multilevel uncinate spurring facet hypertrophic changes are present resulting in mild-to-moderate multilevel neural foraminal narrowing with more moderate to severe narrowing bilaterally C5-6 as well. Upper chest: No acute abnormality in the upper chest or imaged lung apices. Other: Normal thyroid. IMPRESSION: 1. No acute intracranial abnormality. No acute scalp swelling or calvarial fracture. 2. Stable parenchymal volume loss and chronic microvascular ischemic white matter disease. 3. Pneumatized secretions in the right maxillary and sphenoid sinuses. Additional mild pansinus mural disease and small bilateral mastoid effusions. Correlate for features of acute sinusitis. 4. No acute fracture or traumatic listhesis of the cervical spine. 5. Multilevel cervical spondylitic and facet degenerative changes, maximal C5-C7, as above. 6. Cervical and intracranial atherosclerosis. Electronically Signed   By: Kreg Shropshire M.D.   On: 09/29/2020 00:20   CT Cervical Spine Wo Contrast  Result Date: 09/29/2020 CLINICAL DATA:  Fall EXAM: CT HEAD WITHOUT CONTRAST CT CERVICAL SPINE WITHOUT CONTRAST TECHNIQUE: Multidetector CT imaging of the head and cervical spine was performed following the standard protocol without intravenous contrast. Multiplanar CT image reconstructions of the cervical spine were also generated. COMPARISON:  CT head 07/22/2019, 07/17/2019, CT head and cervical spine 07/16/2019 FINDINGS: CT HEAD FINDINGS Brain: No evidence of acute infarction, hemorrhage, hydrocephalus, extra-axial collection, visible mass lesion or mass effect. Symmetric prominence of the ventricles, cisterns and sulci compatible with parenchymal volume loss. Patchy areas of white matter hypoattenuation are most compatible with chronic microvascular angiopathy. Vascular: Atherosclerotic calcification of the carotid siphons and intradural vertebral arteries. No hyperdense vessel. Skull: No  calvarial fracture or suspicious osseous lesion. No scalp swelling or hematoma. Sinuses/Orbits: Pneumatized secretions in the right maxillary and sphenoid sinuses. Additional mild pansinus mural disease  and small bilateral mastoid effusions. Orbital structures are unremarkable aside from prior lens extractions. Other: None. CT CERVICAL SPINE FINDINGS Alignment: Stabilization collar is absent at the time of examination. There is mild rightward lateral cervical flexion and slight rightward cranial rotation. Unchanged degenerative anterolisthesis C3-C5 retrolisthesis C5 on 6. No evidence of traumatic listhesis. No abnormally widened, perched or jumped facets. Normal alignment of the craniocervical articulations. Arthrosis and narrowing of the atlantodental interval but with otherwise normal alignment of the atlantoaxial articulations. Skull base and vertebrae: No acute skull base fracture. No vertebral body fracture or height loss. Normal bone mineralization. No worrisome osseous lesions. Moderate arthrosis at the atlantodental and basion dens interval with some callus formation. Appearance is similar to prior. Multilevel cervical spondylitic changes are described below. Soft tissues and spinal canal: No pre or paravertebral fluid or swelling. No visible canal hematoma. Cervical carotid atherosclerosis at the level of the bifurcations. Airways are patent. Disc levels: Multilevel cervical spondylitic changes are present, maximal C5-C7. Slightly larger disc osteophyte complexes present C4-5 and C6-7 partially efface the ventral thecal sac with spurring, retrolisthesis and some ligamentum flavum infolding at C5-6 resulting in mild canal stenosis. Multilevel uncinate spurring facet hypertrophic changes are present resulting in mild-to-moderate multilevel neural foraminal narrowing with more moderate to severe narrowing bilaterally C5-6 as well. Upper chest: No acute abnormality in the upper chest or imaged lung apices.  Other: Normal thyroid. IMPRESSION: 1. No acute intracranial abnormality. No acute scalp swelling or calvarial fracture. 2. Stable parenchymal volume loss and chronic microvascular ischemic white matter disease. 3. Pneumatized secretions in the right maxillary and sphenoid sinuses. Additional mild pansinus mural disease and small bilateral mastoid effusions. Correlate for features of acute sinusitis. 4. No acute fracture or traumatic listhesis of the cervical spine. 5. Multilevel cervical spondylitic and facet degenerative changes, maximal C5-C7, as above. 6. Cervical and intracranial atherosclerosis. Electronically Signed   By: Lovena Le M.D.   On: 09/29/2020 00:20   CT ABDOMEN PELVIS W CONTRAST  Result Date: 09/29/2020 CLINICAL DATA:  Status post fall and dizziness. EXAM: CT ABDOMEN AND PELVIS WITH CONTRAST TECHNIQUE: Multidetector CT imaging of the abdomen and pelvis was performed using the standard protocol following bolus administration of intravenous contrast. CONTRAST:  146mL OMNIPAQUE IOHEXOL 300 MG/ML  SOLN COMPARISON:  None. FINDINGS: Lower chest: No acute abnormality. Hepatobiliary: No focal liver abnormality is seen. No gallstones, gallbladder wall thickening, or biliary dilatation. Pancreas: Unremarkable. No pancreatic ductal dilatation or surrounding inflammatory changes. Spleen: Normal in size without focal abnormality. Adrenals/Urinary Tract: Adrenal glands are unremarkable. Kidneys are normal, without renal calculi, focal lesion, or hydronephrosis. Bladder is unremarkable. Stomach/Bowel: There is a small hiatal hernia. The appendix is surgically absent. No evidence of bowel wall thickening, distention, or inflammatory changes. Noninflamed diverticula are seen throughout the large bowel. Vascular/Lymphatic: Moderate severity aortic calcification and tortuosity. No enlarged abdominal or pelvic lymph nodes. Reproductive: Status post hysterectomy. No adnexal masses. Other: No abdominal wall  hernia or abnormality. Multiple surgical clips are seen within the mid and lower left abdomen and posterior aspect of the mid to lower pelvis. No abdominopelvic ascites. Musculoskeletal: There is marked severity dextroscoliosis of the lumbar spine with marked severity multilevel degenerative changes. IMPRESSION: 1. Small hiatal hernia. 2. Colonic diverticulosis. 3. Marked severity dextroscoliosis of the lumbar spine with marked severity multilevel degenerative changes. 4. Aortic atherosclerosis. Aortic Atherosclerosis (ICD10-I70.0). Electronically Signed   By: Virgina Norfolk M.D.   On: 09/29/2020 01:34     Assessment and Recommendation  79 y.o. female with known diastolic dysfunction congestive heart failure coronary artery atherosclerosis anemia and paroxysmal nonvalvular atrial fibrillation having a urinary tract infection weakness and falling with improvements at this time.  Telemetry has shown evidence of sick sinus syndrome including long RR intervals and rapid ventricular rate requiring better medication management as well as adjustments including the possibility of pacemaker placement  1.  Continue to monitor for atrial fibrillation sinus pauses or any other rhythm disturbances that could cause episodes of syncope or weakness.  We will continue metoprolol at this time for maintenance of normal sinus rhythm.  Will further discuss the possibility of Micra pacemaker placement due to sick sinus syndrome and better management of current rehospitalization's.  Patient understands the risk and benefits of Micra pacemaker placement.  This includes the possibility of death stroke infection bleeding blood clot hemopericardium.  Patient is at low risk for conscious sedation 2.  Discontinuation of anticoagulation today for preparation for possible Micra pacemaker placement  3.  Have discontinued of statin therapy at this time due to concerns of weakness fatigue and possible side effects contributing to above 4.   Okay for continued hypertension control with amlodipine although will consider the discontinuation if there is lower blood pressure 5.  No further cardiac diagnostics necessary at this time 6.  Continue treatment of urinary tract infection   Signed, Serafina Royals M.D. FACC

## 2020-10-03 DIAGNOSIS — I480 Paroxysmal atrial fibrillation: Secondary | ICD-10-CM | POA: Diagnosis not present

## 2020-10-03 DIAGNOSIS — G20 Parkinson's disease: Secondary | ICD-10-CM | POA: Diagnosis not present

## 2020-10-03 DIAGNOSIS — R55 Syncope and collapse: Secondary | ICD-10-CM | POA: Diagnosis not present

## 2020-10-03 LAB — CBC
HCT: 30.9 % — ABNORMAL LOW (ref 36.0–46.0)
Hemoglobin: 10.1 g/dL — ABNORMAL LOW (ref 12.0–15.0)
MCH: 30.1 pg (ref 26.0–34.0)
MCHC: 32.7 g/dL (ref 30.0–36.0)
MCV: 92.2 fL (ref 80.0–100.0)
Platelets: 264 10*3/uL (ref 150–400)
RBC: 3.35 MIL/uL — ABNORMAL LOW (ref 3.87–5.11)
RDW: 13.5 % (ref 11.5–15.5)
WBC: 7.8 10*3/uL (ref 4.0–10.5)
nRBC: 0 % (ref 0.0–0.2)

## 2020-10-03 LAB — HEPARIN LEVEL (UNFRACTIONATED): Heparin Unfractionated: 1.2 IU/mL — ABNORMAL HIGH (ref 0.30–0.70)

## 2020-10-03 LAB — GLUCOSE, CAPILLARY
Glucose-Capillary: 100 mg/dL — ABNORMAL HIGH (ref 70–99)
Glucose-Capillary: 136 mg/dL — ABNORMAL HIGH (ref 70–99)
Glucose-Capillary: 134 mg/dL — ABNORMAL HIGH (ref 70–99)
Glucose-Capillary: 166 mg/dL — ABNORMAL HIGH (ref 70–99)

## 2020-10-03 LAB — APTT
aPTT: 26 seconds (ref 24–36)
aPTT: 24 seconds (ref 24–36)

## 2020-10-03 MED ORDER — MECLIZINE HCL 25 MG PO TABS
25.0000 mg | ORAL_TABLET | Freq: Three times a day (TID) | ORAL | Status: DC | PRN
  Administered 2020-10-03 (×2): 25 mg via ORAL
  Filled 2020-10-03 (×3): qty 1

## 2020-10-03 MED ORDER — HEPARIN (PORCINE) 25000 UT/250ML-% IV SOLN
850.0000 [IU]/h | INTRAVENOUS | Status: DC
  Administered 2020-10-03: 15:00:00 850 [IU]/h via INTRAVENOUS
  Filled 2020-10-03: qty 250

## 2020-10-03 MED ORDER — HEPARIN (PORCINE) 25000 UT/250ML-% IV SOLN
1150.0000 [IU]/h | INTRAVENOUS | Status: DC
  Administered 2020-10-04: 23:00:00 1150 [IU]/h via INTRAVENOUS
  Filled 2020-10-03: qty 250

## 2020-10-03 NOTE — Progress Notes (Addendum)
ANTICOAGULATION CONSULT NOTE - Initial Consult  Pharmacy Consult for Heparin drip  (previously on apixaban) Indication: atrial fibrillation  Allergies  Allergen Reactions  . Benzocaine Other (See Comments)    Unknown  . Lisinopril Cough  . Neomycin Other (See Comments)    On allergy testing  . Sulfamethoxazole-Trimethoprim Other (See Comments)    Unknown  . Sulfa Antibiotics Rash    Patient Measurements: Height: 5\' 1"  (154.9 cm) Weight: 57.1 kg (125 lb 14.4 oz) IBW/kg (Calculated) : 47.8 Heparin Dosing Weight:    Vital Signs: Temp: 98.1 F (36.7 C) (12/27 1114) Temp Source: Oral (12/27 1114) BP: 113/65 (12/27 1114) Pulse Rate: 80 (12/27 1114)  Labs: No results for input(s): HGB, HCT, PLT, APTT, LABPROT, INR, HEPARINUNFRC, HEPRLOWMOCWT, CREATININE, CKTOTAL, CKMB, TROPONINIHS in the last 72 hours.  Estimated Creatinine Clearance: 35.4 mL/min (by C-G formula based on SCr of 0.94 mg/dL).   Medical History: Past Medical History:  Diagnosis Date  . Arthritis   . Asthma without status asthmaticus    unspecified  . Breast cancer ([place])   . Coronary artery disease   . Diverticulosis   . DVT (deep venous thrombosis) ([place])   . Edema   . Gout   . Hyperlipidemia   . Hypertension   . MI, old   . Myocardial infarct ([place]) 2004  . Parkinson's disease ([place])   . Sleep apnea     Medications:  Scheduled:  . allopurinol  300 mg Oral Daily  . amLODipine  5 mg Oral Daily  . aspirin  81 mg Oral Daily  . carbidopa-levodopa  1 tablet Oral QHS  . carbidopa-levodopa  2 tablet Oral TID with meals  . donepezil  10 mg Oral QHS  . feeding supplement  237 mL Oral BID BM  . insulin aspart  0-5 Units Subcutaneous QHS  . insulin aspart  0-9 Units Subcutaneous TID WC  . metoprolol tartrate  25 mg Oral BID  . multivitamin with minerals  1 tablet Oral Daily  . nystatin   Topical TID  . pantoprazole  40 mg Oral Daily  . senna-docusate  2 tablet Oral BID  . sodium chloride flush  3 mL  Intravenous Q12H   Infusions:  . sodium chloride Stopped (09/29/20 2351)  . heparin      Assessment: 79 yo F to start Heparin drip for Atrial Fibrillation. Patient was on apixaban (last dose was 5 mg on 12/26 at 0810) now with possible plan for pacemaker. Hgb 10.1  Plt 264   APTT 26   Heparin level 1.20   Goal of Therapy:  Heparin level 0.3-0.7 units/ml aPTT 66-102 seconds Monitor platelets by anticoagulation protocol: Yes   Plan:  No bolus (was on apixaban) Start heparin infusion at 850 units/hr Check anti-Xa level in 8 hours and daily while on heparin Continue to monitor H&H and platelets  - aPTT- Heparin level and aPTT do not correlate, will use aPTT to monitor (and check HL once a day to assess for when they begin to correlate)  Adebayo Ensminger A 10/03/2020,2:16 PM

## 2020-10-03 NOTE — Progress Notes (Signed)
ANTICOAGULATION CONSULT NOTE - Initial Consult  Pharmacy Consult for Heparin drip  (previously on apixaban) Indication: atrial fibrillation  Allergies  Allergen Reactions  . Benzocaine Other (See Comments)    Unknown  . Lisinopril Cough  . Neomycin Other (See Comments)    On allergy testing  . Sulfamethoxazole-Trimethoprim Other (See Comments)    Unknown  . Sulfa Antibiotics Rash    Patient Measurements: Height: 5\' 1"  (154.9 cm) Weight: 57.1 kg (125 lb 14.4 oz) IBW/kg (Calculated) : 47.8 Heparin Dosing Weight:    Vital Signs: Temp: 98.6 F (37 C) (12/27 2324) Temp Source: Oral (12/27 2324) BP: 134/55 (12/27 2324) Pulse Rate: 76 (12/27 2324)  Labs: Recent Labs    10/03/20 1427 10/03/20 2145  HGB 10.1*  --   HCT 30.9*  --   PLT 264  --   APTT 26 24  HEPARINUNFRC 1.20*  --     Estimated Creatinine Clearance: 35.4 mL/min (by C-G formula based on SCr of 0.94 mg/dL).   Medical History: Past Medical History:  Diagnosis Date  . Arthritis   . Asthma without status asthmaticus    unspecified  . Breast cancer ([facility])   . Coronary artery disease   . Diverticulosis   . DVT (deep venous thrombosis) ([facility])   . Edema   . Gout   . Hyperlipidemia   . Hypertension   . MI, old   . Myocardial infarct ([facility]) 2004  . Parkinson's disease ([facility])   . Sleep apnea     Medications:  Scheduled:  . allopurinol  300 mg Oral Daily  . amLODipine  5 mg Oral Daily  . aspirin  81 mg Oral Daily  . carbidopa-levodopa  1 tablet Oral QHS  . carbidopa-levodopa  2 tablet Oral TID with meals  . donepezil  10 mg Oral QHS  . feeding supplement  237 mL Oral BID BM  . insulin aspart  0-5 Units Subcutaneous QHS  . insulin aspart  0-9 Units Subcutaneous TID WC  . metoprolol tartrate  25 mg Oral BID  . multivitamin with minerals  1 tablet Oral Daily  . nystatin   Topical TID  . pantoprazole  40 mg Oral Daily  . senna-docusate  2 tablet Oral BID  . sodium chloride flush  3 mL Intravenous Q12H    Infusions:  . sodium chloride Stopped (09/29/20 2351)  . heparin 850 Units/hr (10/03/20 1451)    Assessment: 79 yo F to start Heparin drip for Atrial Fibrillation. Patient was on apixaban (last dose was 5 mg on 12/26 at 0810) now with possible plan for pacemaker. Hgb 10.1  Plt 264   APTT 26   Heparin level 1.20  12/27 2145 aPTT 24   Goal of Therapy:  HDW: 57.1 kg Heparin level 0.3-0.7 units/ml aPTT 66-102 seconds Monitor platelets by anticoagulation protocol: Yes   Plan:  No bolus (was on apixaban) Increase heparin infusion to 1050 units/hr Check anti-Xa level in 8 hours and daily while on heparin Continue to monitor H&H and platelets - aPTT- Heparin level and aPTT do not correlate, will use aPTT to monitor (and check HL once a day to assess for when they begin to correlate)  2146, PharmD, [facility] 10/03/2020 11:44 PM

## 2020-10-03 NOTE — Progress Notes (Signed)
Physical Therapy Treatment Patient Details Name: Melinda Maxwell MRN: [id number] DOB: 01/18/1941 Today's Date: 10/03/2020    History of Present Illness Patient is an 79 y.o. female with medical history significant for CAD, diastolic CHF, paroxysmal A. fib,, Parkinson's disease, chronic dizziness and physical deconditioning, HTN, hospitalized in October syncopal and fall with subdural hematoma. Patient brought into the emergency room after she had two brief syncopal events at home, resulting in a fall without injury.    PT Comments    Pt seen this pm, very pleasant and resting in bed, sister visiting.  Pt had recently received medication for dizziness and has plans for pacemaker placement tomorrow to address cardiac concerns.  Nursing cleared pt for activity today.  Pt able to transfer supine to sit with HOB elevated and MinA to scoot to edge of bed.  Sitting unsupported x 3 minutes without c/o dizziness. Pt transferred sit to stand to RW with CGA from raised surface.  Pt advanced towards bedside chair ~2 feet with CGA. Initial c/o dizziness upon sitting which quickly resolved. Pt educated on B LE seated exercises to promote increased strength and circulation. All needs met, call bell and phone within reach. Pt encouraged to sit up for at least one hour.   Follow Up Recommendations  SNF     Equipment Recommendations  None recommended by PT    Recommendations for Other Services       Precautions / Restrictions Precautions Precautions: Fall Restrictions Weight Bearing Restrictions: No    Mobility  Bed Mobility Overal bed mobility: Needs Assistance Bed Mobility: Supine to Sit     Supine to sit: Min assist        Transfers Overall transfer level: Needs assistance Equipment used: Rolling walker (2 wheeled) Transfers: Sit to/from Stand Sit to Stand: Min guard;From elevated surface         General transfer comment: increased time for proper technique and  safety  Ambulation/Gait Ambulation/Gait assistance: Min assist Gait Distance (Feet): 2 Feet Assistive device: Rolling walker (2 wheeled) Gait Pattern/deviations: Narrow base of support         Stairs             Wheelchair Mobility    Modified Rankin (Stroke Patients Only)       Balance                                            Cognition Arousal/Alertness: Awake/alert Behavior During Therapy: WFL for tasks assessed/performed Overall Cognitive Status: History of cognitive impairments - at baseline                                 General Comments: patient is cooperative and able to follow single step commands consistently. patient is oriented to person, situation but not place or time. Caregiver present and confirmed this is present at baseline. Pt reports having short term memory impairments      Exercises General Exercises - Lower Extremity Ankle Circles/Pumps: AROM;Both;10 reps;Seated (AP, LAQ, hip ABD, Add, hip flexion)    General Comments        Pertinent Vitals/Pain Pain Assessment: No/denies pain    Home Living                      Prior Function              PT Goals (current goals can now be found in the care plan section) Acute Rehab PT Goals Patient Stated Goal: to get better    Frequency    Min 2X/week      PT Plan Current plan remains appropriate    Co-evaluation              AM-PAC PT "6 Clicks" Mobility   Outcome Measure  Help needed turning from your back to your side while in a flat bed without using bedrails?: A Little Help needed moving from lying on your back to sitting on the side of a flat bed without using bedrails?: A Lot Help needed moving to and from a bed to a chair (including a wheelchair)?: A Lot Help needed standing up from a chair using your arms (e.g., wheelchair or bedside chair)?: A Lot Help needed to walk in hospital room?: A Lot Help needed climbing 3-5  steps with a railing? : Total 6 Click Score: 12    End of Session Equipment Utilized During Treatment: Gait belt Activity Tolerance: Patient tolerated treatment well Patient left: in chair;with call bell/phone within reach Nurse Communication: Mobility status PT Visit Diagnosis: Unsteadiness on feet (R26.81);Muscle weakness (generalized) (M62.81)     Time: 1349-1427 PT Time Calculation (min) (ACUTE ONLY): 38 min  Charges:  $Therapeutic Exercise: 8-22 mins $Therapeutic Activity: 23-37 mins                      , PTA    C  10/03/2020, 2:29 PM   

## 2020-10-03 NOTE — Care Management Important Message (Signed)
Important Message  Patient Details  Name: Melinda Maxwell MRN: [id number] Date of Birth: 1940-12-16   Medicare Important Message Given:  N/A - LOS <3 / Initial given by admissions   Initial Medicare IM reviewed with [name], daughter by [name], Patient Access Associate on 10/02/2020 at 12:43pm.   [name] 10/03/2020, 8:58 AM

## 2020-10-03 NOTE — Progress Notes (Signed)
[facility] Cardiology [facility] Encounter Note  Patient: Melinda Maxwell / Admit Date: 09/28/2020 / Date of Encounter: 10/03/2020, 1:41 PM   Subjective: 12/24.  Patient has rested well overnight with no further evidence of significant anginal symptoms or congestive heart failure syncope or falls.  The patient did have atrial fibrillation with rapid ventricular rate on admission but now has spontaneously converted to normal sinus rhythm with preventricular contractions.  There is no evidence of advanced heart block or significant pauses by telemetry.  The patient has had chronic diastolic dysfunction heart failure but currently has no evidence of significant symptoms of that.  Troponin levels have been 24/24 consistent with demand ischemia rather than acute coronary syndrome.  The patient does have urinary tract infection which may have influenced her weakness fatigue and falls.  12/25.  Patient rested well overnight but has not been ambulating throughout the day to assess for any weakness and fatigue and or other concerns.  With Parkinson's she can have some hypotension dizziness weakness and falls.  Additionally she has not had any rhythm disturbances causing these falls based on recent evidence as an outpatient as well is currently here as an inpatient.  Current telemetry does show that she has normal sinus rhythm with frequent preventricular contractions and some short runs of atrial fibrillation.  She is slowly recovering from urinary tract infection  12/26.  Patient overall has felt fairly well since admission to the hospital with no evidence of further significant PND orthopnea syncope dizziness nausea or diaphoresis.  Patient has had episodes of atrial fibrillation with more rapid rate requiring metoprolol use as well as spontaneous conversion to normal sinus rhythm.  Additionally there is some longer RR intervals and longer pauses at least up to 2.6 seconds.  This is most consistent with sick  sinus syndrome which may be contributing to her current issues of weakness fatigue and syncope.  We will need further discussion of the possibility of pacemaker placement including pacemaker with a Micra.  Will consider this a possibility after further adjustments of medication management and discontinuation of anticoagulation for further risk reduction in bleeding complications with procedure.  12/27.  Patient has had an episode of dizziness and weakness while sitting in the bed most consistent with her previous spells that she has had before.  The spells have caused her to have falls as well.  In review of her telemetry it appears that she had a 5 beat run of wide-complex tachycardia which has been seen in the past by Holter monitor.  Additionally previously we have seen supraventricular tachycardia very fast heart rate of atrial fibrillation and slow heart rates with long RR intervals.  All of these could be causing her episodes.  This increases her risks of falls and concerns.  With increased medical management of these the patient will have worsening symptoms of bradycardia and long RR intervals.  We have suggested increasing beta-blocker and potentially using amiodarone at low dose for maintenance of normal sinus rhythm and reduction of significant symptoms and issues related to above.  After secondary consultation and discussion with Dr. Darrold Junker there is agreement for the possibility of Micra pacemaker as backup with better management.  We have discussed at length with the family and the patient about Micra pacemaker backup as next treatment.  She understands all the risk and benefits at this and agrees.  Review of Systems: Positive for: Shortness of breath Negative for: Vision change, hearing change, syncope, dizziness, nausea, vomiting,diarrhea, bloody stool, stomach pain,  cough, congestion, diaphoresis, urinary frequency, urinary pain,skin lesions, skin rashes Others previously  listed  Objective: Telemetry: Normal sinus rhythm with preventricular contractions Physical Exam: Blood pressure 113/65, pulse 80, temperature 98.1 F (36.7 C), temperature source Oral, resp. rate 20, height 5\' 1"  (1.549 m), weight 57.1 kg, SpO2 97 %. Body mass index is 23.79 kg/m. General: Well developed, well nourished, in no acute distress. Head: Normocephalic, atraumatic, sclera non-icteric, no xanthomas, nares are without discharge. Neck: No apparent masses Lungs: Normal respirations with no wheezes, no rhonchi, no rales , no crackles   Heart: Irregular rate and rhythm, normal S1 S2, no murmur, no rub, no gallop, PMI is normal size and placement, carotid upstroke normal without bruit, jugular venous pressure normal Abdomen: Soft, non-tender, non-distended with normoactive bowel sounds. No hepatosplenomegaly. Abdominal aorta is normal size without bruit Extremities: Trace edema, no clubbing, no cyanosis, no ulcers,  Peripheral: 2+ radial, 2+ femoral, 2+ dorsal pedal pulses Neuro: Alert and oriented. Moves all extremities spontaneously. Psych:  Responds to questions appropriately with a normal affect.   Intake/Output Summary (Last 24 hours) at 10/03/2020 1341 Last data filed at 10/03/2020 0900 Gross per 24 hour  Intake 600 ml  Output 300 ml  Net 300 ml    Inpatient Medications:  . allopurinol  300 mg Oral Daily  . amLODipine  5 mg Oral Daily  . aspirin  81 mg Oral Daily  . carbidopa-levodopa  1 tablet Oral QHS  . carbidopa-levodopa  2 tablet Oral TID with meals  . donepezil  10 mg Oral QHS  . feeding supplement  237 mL Oral BID BM  . insulin aspart  0-5 Units Subcutaneous QHS  . insulin aspart  0-9 Units Subcutaneous TID WC  . metoprolol tartrate  25 mg Oral BID  . multivitamin with minerals  1 tablet Oral Daily  . nystatin   Topical TID  . pantoprazole  40 mg Oral Daily  . senna-docusate  2 tablet Oral BID  . sodium chloride flush  3 mL Intravenous Q12H   Infusions:   . sodium chloride Stopped (09/29/20 2351)    Labs: No results for input(s): NA, K, CL, CO2, GLUCOSE, BUN, CREATININE, CALCIUM, MG, PHOS in the last 72 hours. No results for input(s): AST, ALT, ALKPHOS, BILITOT, PROT, ALBUMIN in the last 72 hours. No results for input(s): WBC, NEUTROABS, HGB, HCT, MCV, PLT in the last 72 hours. No results for input(s): CKTOTAL, CKMB, TROPONINI in the last 72 hours. Invalid input(s): POCBNP No results for input(s): HGBA1C in the last 72 hours.   Weights: Filed Weights   09/29/20 0951 09/30/20 0412 10/03/20 0500  Weight: 59 kg 57.9 kg 57.1 kg     Radiology/Studies:  DG Chest 2 View  Result Date: 09/28/2020 CLINICAL DATA:  Dizziness. EXAM: CHEST - 2 VIEW COMPARISON:  October 15, 2019 FINDINGS: Chronic scarring is seen within the lateral aspect of the left lung base. There is no evidence of acute infiltrate, pleural effusion or pneumothorax. The heart size and mediastinal contours are within normal limits. A chronic deformity is seen involving the proximal left humerus. Degenerative changes seen throughout the thoracic spine. IMPRESSION: Chronic left basilar scarring without acute or active cardiopulmonary disease. Electronically Signed   By: Virgina Norfolk M.D.   On: 09/28/2020 23:52   DG Thoracic Spine 2 View  Result Date: 09/28/2020 CLINICAL DATA:  Status post fall. EXAM: THORACIC SPINE 2 VIEWS COMPARISON:  None. FINDINGS: There is no evidence of an acute thoracic spine fracture. There  is mild levoscoliosis of the lower thoracic spine. Mild-to-moderate severity multilevel endplate sclerosis is seen with multilevel intervertebral disc space narrowing. No other significant bone abnormalities are identified. IMPRESSION: 1. Mild-to-moderate severity multilevel degenerative changes without an acute osseous abnormality. Electronically Signed   By: Virgina Norfolk M.D.   On: 09/28/2020 23:53   DG Lumbar Spine Complete  Result Date: 09/28/2020 CLINICAL  DATA:  Status post fall. EXAM: LUMBAR SPINE - COMPLETE 4+ VIEW COMPARISON:  None. FINDINGS: There is no evidence of an acute lumbar spine fracture. There is marked severity dextroscoliosis of the lumbar spine. There is moderate to marked severity multilevel endplate sclerosis with moderate to marked severity multilevel intervertebral disc space narrowing. Multiple radiopaque surgical clips and surgical sutures are seen within the pelvis. IMPRESSION: 1. Marked severity dextroscoliosis of the lumbar spine with moderate to marked severity multilevel degenerative disc disease. Electronically Signed   By: Virgina Norfolk M.D.   On: 09/28/2020 23:54   CT Head Wo Contrast  Result Date: 09/29/2020 CLINICAL DATA:  Fall EXAM: CT HEAD WITHOUT CONTRAST CT CERVICAL SPINE WITHOUT CONTRAST TECHNIQUE: Multidetector CT imaging of the head and cervical spine was performed following the standard protocol without intravenous contrast. Multiplanar CT image reconstructions of the cervical spine were also generated. COMPARISON:  CT head 07/22/2019, 07/17/2019, CT head and cervical spine 07/16/2019 FINDINGS: CT HEAD FINDINGS Brain: No evidence of acute infarction, hemorrhage, hydrocephalus, extra-axial collection, visible mass lesion or mass effect. Symmetric prominence of the ventricles, cisterns and sulci compatible with parenchymal volume loss. Patchy areas of white matter hypoattenuation are most compatible with chronic microvascular angiopathy. Vascular: Atherosclerotic calcification of the carotid siphons and intradural vertebral arteries. No hyperdense vessel. Skull: No calvarial fracture or suspicious osseous lesion. No scalp swelling or hematoma. Sinuses/Orbits: Pneumatized secretions in the right maxillary and sphenoid sinuses. Additional mild pansinus mural disease and small bilateral mastoid effusions. Orbital structures are unremarkable aside from prior lens extractions. Other: None. CT CERVICAL SPINE FINDINGS  Alignment: Stabilization collar is absent at the time of examination. There is mild rightward lateral cervical flexion and slight rightward cranial rotation. Unchanged degenerative anterolisthesis C3-C5 retrolisthesis C5 on 6. No evidence of traumatic listhesis. No abnormally widened, perched or jumped facets. Normal alignment of the craniocervical articulations. Arthrosis and narrowing of the atlantodental interval but with otherwise normal alignment of the atlantoaxial articulations. Skull base and vertebrae: No acute skull base fracture. No vertebral body fracture or height loss. Normal bone mineralization. No worrisome osseous lesions. Moderate arthrosis at the atlantodental and basion dens interval with some callus formation. Appearance is similar to prior. Multilevel cervical spondylitic changes are described below. Soft tissues and spinal canal: No pre or paravertebral fluid or swelling. No visible canal hematoma. Cervical carotid atherosclerosis at the level of the bifurcations. Airways are patent. Disc levels: Multilevel cervical spondylitic changes are present, maximal C5-C7. Slightly larger disc osteophyte complexes present C4-5 and C6-7 partially efface the ventral thecal sac with spurring, retrolisthesis and some ligamentum flavum infolding at C5-6 resulting in mild canal stenosis. Multilevel uncinate spurring facet hypertrophic changes are present resulting in mild-to-moderate multilevel neural foraminal narrowing with more moderate to severe narrowing bilaterally C5-6 as well. Upper chest: No acute abnormality in the upper chest or imaged lung apices. Other: Normal thyroid. IMPRESSION: 1. No acute intracranial abnormality. No acute scalp swelling or calvarial fracture. 2. Stable parenchymal volume loss and chronic microvascular ischemic white matter disease. 3. Pneumatized secretions in the right maxillary and sphenoid sinuses. Additional mild pansinus mural disease  and small bilateral mastoid  effusions. Correlate for features of acute sinusitis. 4. No acute fracture or traumatic listhesis of the cervical spine. 5. Multilevel cervical spondylitic and facet degenerative changes, maximal C5-C7, as above. 6. Cervical and intracranial atherosclerosis. Electronically Signed   By: Lovena Le M.D.   On: 09/29/2020 00:20   CT Cervical Spine Wo Contrast  Result Date: 09/29/2020 CLINICAL DATA:  Fall EXAM: CT HEAD WITHOUT CONTRAST CT CERVICAL SPINE WITHOUT CONTRAST TECHNIQUE: Multidetector CT imaging of the head and cervical spine was performed following the standard protocol without intravenous contrast. Multiplanar CT image reconstructions of the cervical spine were also generated. COMPARISON:  CT head 07/22/2019, 07/17/2019, CT head and cervical spine 07/16/2019 FINDINGS: CT HEAD FINDINGS Brain: No evidence of acute infarction, hemorrhage, hydrocephalus, extra-axial collection, visible mass lesion or mass effect. Symmetric prominence of the ventricles, cisterns and sulci compatible with parenchymal volume loss. Patchy areas of white matter hypoattenuation are most compatible with chronic microvascular angiopathy. Vascular: Atherosclerotic calcification of the carotid siphons and intradural vertebral arteries. No hyperdense vessel. Skull: No calvarial fracture or suspicious osseous lesion. No scalp swelling or hematoma. Sinuses/Orbits: Pneumatized secretions in the right maxillary and sphenoid sinuses. Additional mild pansinus mural disease and small bilateral mastoid effusions. Orbital structures are unremarkable aside from prior lens extractions. Other: None. CT CERVICAL SPINE FINDINGS Alignment: Stabilization collar is absent at the time of examination. There is mild rightward lateral cervical flexion and slight rightward cranial rotation. Unchanged degenerative anterolisthesis C3-C5 retrolisthesis C5 on 6. No evidence of traumatic listhesis. No abnormally widened, perched or jumped facets. Normal  alignment of the craniocervical articulations. Arthrosis and narrowing of the atlantodental interval but with otherwise normal alignment of the atlantoaxial articulations. Skull base and vertebrae: No acute skull base fracture. No vertebral body fracture or height loss. Normal bone mineralization. No worrisome osseous lesions. Moderate arthrosis at the atlantodental and basion dens interval with some callus formation. Appearance is similar to prior. Multilevel cervical spondylitic changes are described below. Soft tissues and spinal canal: No pre or paravertebral fluid or swelling. No visible canal hematoma. Cervical carotid atherosclerosis at the level of the bifurcations. Airways are patent. Disc levels: Multilevel cervical spondylitic changes are present, maximal C5-C7. Slightly larger disc osteophyte complexes present C4-5 and C6-7 partially efface the ventral thecal sac with spurring, retrolisthesis and some ligamentum flavum infolding at C5-6 resulting in mild canal stenosis. Multilevel uncinate spurring facet hypertrophic changes are present resulting in mild-to-moderate multilevel neural foraminal narrowing with more moderate to severe narrowing bilaterally C5-6 as well. Upper chest: No acute abnormality in the upper chest or imaged lung apices. Other: Normal thyroid. IMPRESSION: 1. No acute intracranial abnormality. No acute scalp swelling or calvarial fracture. 2. Stable parenchymal volume loss and chronic microvascular ischemic white matter disease. 3. Pneumatized secretions in the right maxillary and sphenoid sinuses. Additional mild pansinus mural disease and small bilateral mastoid effusions. Correlate for features of acute sinusitis. 4. No acute fracture or traumatic listhesis of the cervical spine. 5. Multilevel cervical spondylitic and facet degenerative changes, maximal C5-C7, as above. 6. Cervical and intracranial atherosclerosis. Electronically Signed   By: Lovena Le M.D.   On: 09/29/2020  00:20   CT ABDOMEN PELVIS W CONTRAST  Result Date: 09/29/2020 CLINICAL DATA:  Status post fall and dizziness. EXAM: CT ABDOMEN AND PELVIS WITH CONTRAST TECHNIQUE: Multidetector CT imaging of the abdomen and pelvis was performed using the standard protocol following bolus administration of intravenous contrast. CONTRAST:  147mL OMNIPAQUE IOHEXOL 300 MG/ML  SOLN COMPARISON:  None. FINDINGS: Lower chest: No acute abnormality. Hepatobiliary: No focal liver abnormality is seen. No gallstones, gallbladder wall thickening, or biliary dilatation. Pancreas: Unremarkable. No pancreatic ductal dilatation or surrounding inflammatory changes. Spleen: Normal in size without focal abnormality. Adrenals/Urinary Tract: Adrenal glands are unremarkable. Kidneys are normal, without renal calculi, focal lesion, or hydronephrosis. Bladder is unremarkable. Stomach/Bowel: There is a small hiatal hernia. The appendix is surgically absent. No evidence of bowel wall thickening, distention, or inflammatory changes. Noninflamed diverticula are seen throughout the large bowel. Vascular/Lymphatic: Moderate severity aortic calcification and tortuosity. No enlarged abdominal or pelvic lymph nodes. Reproductive: Status post hysterectomy. No adnexal masses. Other: No abdominal wall hernia or abnormality. Multiple surgical clips are seen within the mid and lower left abdomen and posterior aspect of the mid to lower pelvis. No abdominopelvic ascites. Musculoskeletal: There is marked severity dextroscoliosis of the lumbar spine with marked severity multilevel degenerative changes. IMPRESSION: 1. Small hiatal hernia. 2. Colonic diverticulosis. 3. Marked severity dextroscoliosis of the lumbar spine with marked severity multilevel degenerative changes. 4. Aortic atherosclerosis. Aortic Atherosclerosis (ICD10-I70.0). Electronically Signed   By: Virgina Norfolk M.D.   On: 09/29/2020 01:34     Assessment and Recommendation  79 y.o. female with  known diastolic dysfunction congestive heart failure coronary artery atherosclerosis anemia and paroxysmal nonvalvular atrial fibrillation having a urinary tract infection weakness and falling with improvements at this time.  Telemetry has shown evidence of sick sinus syndrome including long RR intervals and rapid ventricular rate and rare episodes of 5 beat runs of wide-complex tachycardia requiring better medication management as well as adjustments including the possibility of pacemaker placement  1.  Continue to monitor for atrial fibrillation sinus pauses or any other rhythm disturbances that could cause episodes of syncope or weakness.  We will continue metoprolol at this time for maintenance of normal sinus rhythm.  Will further discuss the possibility of Micra pacemaker placement due to sick sinus syndrome and better management of current rehospitalization's.  Patient understands the risk and benefits of Micra pacemaker placement.  This includes the possibility of death stroke infection bleeding blood clot hemopericardium.  Patient is at low risk for conscious sedation 2.  Discontinuation of anticoagulation today for preparation for possible Micra pacemaker placement but will use heparin in the meantime 3.  Have discontinued of statin therapy at this time due to concerns of weakness fatigue and possible side effects contributing to above 4.  Okay for continued hypertension control with amlodipine although will consider the discontinuation if there is lower blood pressure 5.  No further cardiac diagnostics necessary at this time 6.  Continue treatment of urinary tract infection   Signed, Serafina Royals M.D. FACC

## 2020-10-03 NOTE — Progress Notes (Signed)
Occupational Therapy Treatment Patient Details Name: ZAVANNA CARDWELL MRN: [id number] DOB: 21-Oct-1940 Today's Date: 10/03/2020    History of present illness Patient is an 79 y.o. female with medical history significant for CAD, diastolic CHF, paroxysmal A. fib,, Parkinson's disease, chronic dizziness and physical deconditioning, HTN, hospitalized in October syncopal and fall with subdural hematoma. Patient brought into the emergency room after she had two brief syncopal events at home, resulting in a fall without injury.   OT comments  Ms. [place] stated that she was having surgery tomorrow, and that she wanted "to get cleaned up" before that. Sitting in recliner, she assisted in shampooing and combing hair, UB bathing, grooming, dressing. She required frequent encouragement to remain engaged. Although she appeared physically able to perform each of these tasks, she says that she "always has someone else do my bath and hair." Pt reports no pain today, and no dizziness in sitting. She requested to use BSC, and became dizzy immediately upon standing. After several minutes standing with RW, pt stated that she was able to begin transfer to commode, but was anxious, repeatedly saying, "don't let go of me!" Required RW and +2 assist for transfer. Ms. Heffelfinger will continue to benefit from OT services while hospitalized. Recommend HHOT post D/C. Level of required home-based care can better be determined after pt has pacemaker in place.    Follow Up Recommendations  Home health OT;Supervision/Assistance - 24 hour    Equipment Recommendations       Recommendations for Other Services      Precautions / Restrictions Precautions Precautions: Fall Restrictions Weight Bearing Restrictions: No       Mobility Bed Mobility Overal bed mobility: Needs Assistance Bed Mobility: Supine to Sit     Supine to sit: Min assist     General bed mobility comments: Pt received in recliner  Transfers Overall  transfer level: Needs assistance Equipment used: Rolling walker (2 wheeled) Transfers: Sit to/from Stand Sit to Stand: Max assist;+2 physical assistance;+2 safety/equipment         General transfer comment: required increased time -- pt taking tiny, shuffling steps, expressing high level of FOF    Balance Overall balance assessment: Needs assistance   Sitting balance-Leahy Scale: Good Sitting balance - Comments: Able to shift body in recliner to assist with grooming, dressing tasks   Standing balance support: Bilateral upper extremity supported Standing balance-Leahy Scale: Poor                             ADL either performed or assessed with clinical judgement   ADL Overall ADL's : Needs assistance/impaired     Grooming: Brushing hair;Oral care;Applying deodorant;Wash/dry face;Wash/dry hands;Sitting;Cueing for sequencing;Minimal assistance   Upper Body Bathing: Min guard;Cueing for sequencing;Sitting       Upper Body Dressing : Moderate assistance;Sitting       Toilet Transfer: Maximal assistance;+2 for physical assistance;+2 for safety/equipment;BSC;RW                   Vision Patient Visual Report: No change from baseline     Perception     Praxis      Cognition Arousal/Alertness: Awake/alert Behavior During Therapy: WFL for tasks assessed/performed Overall Cognitive Status: Within Functional Limits for tasks assessed                                 General Comments: patient  is cooperative and able to follow single step commands consistently. patient is oriented to person, situation but not place or time. Caregiver present and confirmed this is present at baseline. Pt reports having short term memory impairments        Exercises Exercises: General Lower Extremity;Other exercises General Exercises - Lower Extremity Ankle Circles/Pumps: AROM;Both;10 reps;Seated (AP, LAQ, hip ABD, Add, hip flexion) Other Exercises Other  Exercises: Engaged in therex in recliner, UB bathing, grooming, dressing, toileting   Shoulder Instructions       General Comments      Pertinent Vitals/ Pain       Pain Assessment: No/denies pain  Home Living                                          Prior Functioning/Environment              Frequency  Min 1X/week        Progress Toward Goals  OT Goals(current goals can now be found in the care plan section)  Progress towards OT goals: Progressing toward goals  Acute Rehab OT Goals Patient Stated Goal: to get better OT Goal Formulation: With patient Time For Goal Achievement: 10/13/20 Potential to Achieve Goals: Good  Plan Discharge plan remains appropriate    Co-evaluation                 AM-PAC OT "6 Clicks" Daily Activity     Outcome Measure   Help from another person eating meals?: A Little Help from another person taking care of personal grooming?: A Little Help from another person toileting, which includes using toliet, bedpan, or urinal?: A Lot Help from another person bathing (including washing, rinsing, drying)?: A Lot Help from another person to put on and taking off regular upper body clothing?: A Little Help from another person to put on and taking off regular lower body clothing?: A Lot 6 Click Score: 15    End of Session Equipment Utilized During Treatment: Rolling walker  OT Visit Diagnosis: Unsteadiness on feet (R26.81);Repeated falls (R29.6);History of falling (Z91.81);Muscle weakness (generalized) (M62.81);Dizziness and giddiness (R42)   Activity Tolerance Patient tolerated treatment well   Patient Left with nursing/sitter in room (pt left on [facility], with NT in room)   Nurse Communication          Time: [id number] OT Time Calculation (min): 39 min  Charges: OT General Charges $OT Visit: 1 Visit OT Treatments $Self Care/Home Management : 23-37 mins $Therapeutic Activity: 8-22 mins  Latina Craver,  PhD, MS, OTR/L ascom [phone] 10/03/20, 4:25 PM

## 2020-10-03 NOTE — Progress Notes (Signed)
PROGRESS NOTE    Melinda Maxwell  LDJ:[id number] DOB: 20-Apr-1941 DOA: 09/28/2020 PCP: Lauro Regulus, MD   Chief complaint.  Syncope. Brief Narrative:  Melinda Maxwell a 79 y.o.femalewith medical history significant forCAD, diastolic CHF, paroxysmal A. fib on Xarelto, Parkinson's disease, chronic dizziness and physical deconditioning, HTN,who was brought to the hospital after 2 episodes of syncope at homewithfall. She previously had a similar episodes last year. She was also found to have UTI, started on Rocephin. Patient was given a dose of fosfomycin on 12/24 to complete antibiotics.   Assessment & Plan:   Active Problems:   UTI (urinary tract infection)   CAD (coronary artery disease)   Benign essential hypertension   Paroxysmal atrial fibrillation (HCC)   Chronic diastolic CHF (congestive heart failure) (HCC)   Syncope   Hypokalemia   Type 2 diabetes mellitus without complication (HCC)   Chronic anticoagulation   Parkinson disease (HCC)  1.  Syncope. 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  They are planning for pacemaker placement tomorrow.  #2.  UTI. Antibiotics completed.  3.  Paroxysmal atrial fibrillation. Chronic diastolic congestive heart failure. Condition is stable.  #4.  Parkinson disease per  #5.  Nonsustained VT. Followed by cardiology.    DVT prophylaxis: SCDs Code Status: Full Family Communication:  Disposition Plan:  .   Status is: Inpatient  Remains inpatient appropriate because:Ongoing diagnostic testing needed not appropriate for outpatient work up Patient is getting a pacemaker tomorrow  Dispo: The patient is from: Home              Anticipated d/c is to: SNF              Anticipated d/c date is: 2 days              Patient currently is not medically stable to d/c.        I/O last 3 completed shifts: In: 360 [P.O.:360] Out: 500 [Urine:500] Total I/O In: 480 [P.O.:480] Out: -      Consultants:    Cardiology  Procedures: Scheduled for pacemaker  Antimicrobials: None  Subjective: Patient doing well today.  He denies any short of breath or cough.  She has no abdominal pain or nausea vomiting.  No fever or chills.  Objective: Vitals:   10/03/20 0500 10/03/20 0529 10/03/20 0735 10/03/20 1114  BP:  (!) 155/78 (!) 167/88 113/65  Pulse:  (!) 59 60 80  Resp:  14  20  Temp:  98.4 F (36.9 C) 98.1 F (36.7 C) 98.1 F (36.7 C)  TempSrc:  Oral Oral Oral  SpO2:  99% 100% 97%  Weight: 57.1 kg     Height: 5\' 1"  (1.549 m)       Intake/Output Summary (Last 24 hours) at 10/03/2020 1511 Last data filed at 10/03/2020 1300 Gross per 24 hour  Intake 840 ml  Output 300 ml  Net 540 ml   Filed Weights   09/29/20 0951 09/30/20 0412 10/03/20 0500  Weight: 59 kg 57.9 kg 57.1 kg    Examination:  General exam: Appears calm and comfortable  Respiratory system: Clear to auscultation. Respiratory effort normal. Cardiovascular system: Irregular, no JVD, murmurs, rubs, gallops or clicks. No pedal edema. Gastrointestinal system: Abdomen is nondistended, soft and nontender. No organomegaly or masses felt. Normal bowel sounds heard. Central nervous system: Alert and oriented. No focal neurological deficits. Extremities: Symmetric  Skin: No rashes, lesions or ulcers Psychiatry:  Mood & affect appropriate.  Data Reviewed: I have personally reviewed following labs and imaging studies  CBC: Recent Labs  Lab 09/28/20 1221 09/29/20 0438 10/03/20 1427  WBC 6.1 7.1 7.8  HGB 10.7* 10.1* 10.1*  HCT 32.3* 29.6* 30.9*  MCV 91.0 90.2 92.2  PLT 253 235 [id number]   Basic Metabolic Panel: Recent Labs  Lab 09/28/20 1221 09/29/20 0438  NA 135 136  K 2.8* 3.9  CL 96* 100  CO2 26 25  GLUCOSE 103* 98  BUN 17 18  CREATININE 0.94 0.94  CALCIUM 9.7 9.4   GFR: Estimated Creatinine Clearance: 35.4 mL/min (by C-G formula based on SCr of 0.94 mg/dL). Liver Function Tests: No results for  input(s): AST, ALT, ALKPHOS, BILITOT, PROT, ALBUMIN in the last 168 hours. No results for input(s): LIPASE, AMYLASE in the last 168 hours. No results for input(s): AMMONIA in the last 168 hours. Coagulation Profile: No results for input(s): INR, PROTIME in the last 168 hours. Cardiac Enzymes: Recent Labs  Lab 09/28/20 1221  CKTOTAL 29*   BNP (last 3 results) No results for input(s): PROBNP in the last 8760 hours. HbA1C: No results for input(s): HGBA1C in the last 72 hours. CBG: Recent Labs  Lab 10/02/20 1127 10/02/20 1618 10/02/20 2210 10/03/20 0739 10/03/20 1215  GLUCAP 137* 104* 122* 100* 136*   Lipid Profile: No results for input(s): CHOL, HDL, LDLCALC, TRIG, CHOLHDL, LDLDIRECT in the last 72 hours. Thyroid Function Tests: No results for input(s): TSH, T4TOTAL, FREET4, T3FREE, THYROIDAB in the last 72 hours. Anemia Panel: No results for input(s): VITAMINB12, FOLATE, FERRITIN, TIBC, IRON, RETICCTPCT in the last 72 hours. Sepsis Labs: No results for input(s): PROCALCITON, LATICACIDVEN in the last 168 hours.  Recent Results (from the past 240 hour(s))  Resp Panel by RT-PCR (Flu A&B, Covid) Nasopharyngeal Swab     Status: None   Collection Time: 09/29/20 12:22 AM   Specimen: Nasopharyngeal Swab; Nasopharyngeal(NP) swabs in vial transport medium  Result Value Ref Range Status   SARS Coronavirus 2 by RT PCR NEGATIVE NEGATIVE Final    Comment: (NOTE) SARS-CoV-2 target nucleic acids are NOT DETECTED.  The SARS-CoV-2 RNA is generally detectable in upper respiratory specimens during the acute phase of infection. The lowest concentration of SARS-CoV-2 viral copies this assay can detect is 138 copies/mL. A negative result does not preclude SARS-Cov-2 infection and should not be used as the sole basis for treatment or other patient management decisions. A negative result may occur with  improper specimen collection/handling, submission of specimen other than nasopharyngeal  swab, presence of viral mutation(s) within the areas targeted by this assay, and inadequate number of viral copies(<138 copies/mL). A negative result must be combined with clinical observations, patient history, and epidemiological information. The expected result is Negative.  Fact Sheet for Patients:  EntrepreneurPulse.com.au  Fact Sheet for Healthcare Providers:  IncredibleEmployment.be  This test is no t yet approved or cleared by the [place] FDA and  has been authorized for detection and/or diagnosis of SARS-CoV-2 by FDA under an Emergency Use Authorization (EUA). This EUA will remain  in effect (meaning this test can be used) for the duration of the COVID-19 declaration under Section 564(b)(1) of the Act, 21 U.S.C.section 360bbb-3(b)(1), unless the authorization is terminated  or revoked sooner.       Influenza A by PCR NEGATIVE NEGATIVE Final   Influenza B by PCR NEGATIVE NEGATIVE Final    Comment: (NOTE) The Xpert Xpress SARS-CoV-2/FLU/RSV plus assay is intended as an aid in the diagnosis of influenza  from Nasopharyngeal swab specimens and should not be used as a sole basis for treatment. Nasal washings and aspirates are unacceptable for Xpert Xpress SARS-CoV-2/FLU/RSV testing.  Fact Sheet for Patients: EntrepreneurPulse.com.au  Fact Sheet for Healthcare Providers: IncredibleEmployment.be  This test is not yet approved or cleared by the [place] FDA and has been authorized for detection and/or diagnosis of SARS-CoV-2 by FDA under an Emergency Use Authorization (EUA). This EUA will remain in effect (meaning this test can be used) for the duration of the COVID-19 declaration under Section 564(b)(1) of the Act, 21 U.S.C. section 360bbb-3(b)(1), unless the authorization is terminated or revoked.  Performed at [facility], [address] [place], [place] [address]           Radiology Studies: No results found.      Scheduled Meds: . allopurinol  300 mg Oral Daily  . amLODipine  5 mg Oral Daily  . aspirin  81 mg Oral Daily  . carbidopa-levodopa  1 tablet Oral QHS  . carbidopa-levodopa  2 tablet Oral TID with meals  . donepezil  10 mg Oral QHS  . feeding supplement  237 mL Oral BID BM  . insulin aspart  0-5 Units Subcutaneous QHS  . insulin aspart  0-9 Units Subcutaneous TID WC  . metoprolol tartrate  25 mg Oral BID  . multivitamin with minerals  1 tablet Oral Daily  . nystatin   Topical TID  . pantoprazole  40 mg Oral Daily  . senna-docusate  2 tablet Oral BID  . sodium chloride flush  3 mL Intravenous Q12H   Continuous Infusions: . sodium chloride Stopped (09/29/20 2351)  . heparin 850 Units/hr (10/03/20 1451)     LOS: 4 days    Time spent: 25 minutes    Sharen Hones, MD [facility]   To contact the attending provider between 7A-7P or the covering provider during after hours 7P-7A, please log into the web site [email or web address] and access using universal [place] password for that web site. If you do not have the password, please call the hospital operator.  10/03/2020, 3:11 PM

## 2020-10-04 DIAGNOSIS — N3000 Acute cystitis without hematuria: Secondary | ICD-10-CM | POA: Diagnosis not present

## 2020-10-04 DIAGNOSIS — R55 Syncope and collapse: Secondary | ICD-10-CM | POA: Diagnosis not present

## 2020-10-04 DIAGNOSIS — G20 Parkinson's disease: Secondary | ICD-10-CM | POA: Diagnosis not present

## 2020-10-04 LAB — CBC
HCT: 29 % — ABNORMAL LOW (ref 36.0–46.0)
Hemoglobin: 9.7 g/dL — ABNORMAL LOW (ref 12.0–15.0)
MCH: 30.6 pg (ref 26.0–34.0)
MCHC: 33.4 g/dL (ref 30.0–36.0)
MCV: 91.5 fL (ref 80.0–100.0)
Platelets: 227 10*3/uL (ref 150–400)
RBC: 3.17 MIL/uL — ABNORMAL LOW (ref 3.87–5.11)
RDW: 13.6 % (ref 11.5–15.5)
WBC: 7 10*3/uL (ref 4.0–10.5)
nRBC: 0 % (ref 0.0–0.2)

## 2020-10-04 LAB — GLUCOSE, CAPILLARY
Glucose-Capillary: 78 mg/dL (ref 70–99)
Glucose-Capillary: 98 mg/dL (ref 70–99)
Glucose-Capillary: 146 mg/dL — ABNORMAL HIGH (ref 70–99)
Glucose-Capillary: 98 mg/dL (ref 70–99)

## 2020-10-04 LAB — HEPARIN LEVEL (UNFRACTIONATED): Heparin Unfractionated: 1.1 IU/mL — ABNORMAL HIGH (ref 0.30–0.70)

## 2020-10-04 LAB — APTT
aPTT: 92 seconds — ABNORMAL HIGH (ref 24–36)
aPTT: 62 seconds — ABNORMAL HIGH (ref 24–36)

## 2020-10-04 NOTE — Progress Notes (Signed)
ANTICOAGULATION CONSULT NOTE  Pharmacy Consult for Heparin drip  (previously on apixaban) Indication: atrial fibrillation  Allergies  Allergen Reactions  . Benzocaine Other (See Comments)    Unknown  . Lisinopril Cough  . Neomycin Other (See Comments)    On allergy testing  . Sulfamethoxazole-Trimethoprim Other (See Comments)    Unknown  . Sulfa Antibiotics Rash    Patient Measurements: Height: 5\' 1"  (154.9 cm) Weight: 56.1 kg (123 lb 10.9 oz) IBW/kg (Calculated) : 47.8 Heparin Dosing Weight:    Vital Signs: Temp: 97.7 F (36.5 C) (12/28 0900) Temp Source: Oral (12/28 0900) BP: 167/87 (12/28 0900) Pulse Rate: 87 (12/28 0900)  Labs: Recent Labs    10/03/20 1427 10/03/20 2145 10/04/20 0336 10/04/20 0914  HGB 10.1*  --  9.7*  --   HCT 30.9*  --  29.0*  --   PLT 264  --  227  --   APTT 26 24  --  92*  HEPARINUNFRC 1.20*  --  1.10*  --     Estimated Creatinine Clearance: 35.4 mL/min (by C-G formula based on SCr of 0.94 mg/dL).   Medical History: Past Medical History:  Diagnosis Date  . Arthritis   . Asthma without status asthmaticus    unspecified  . Breast cancer (HCC)   . Coronary artery disease   . Diverticulosis   . DVT (deep venous thrombosis) (HCC)   . Edema   . Gout   . Hyperlipidemia   . Hypertension   . MI, old   . Myocardial infarct (HCC) 2004  . Parkinson's disease (HCC)   . Sleep apnea     Medications:  Scheduled:  . allopurinol  300 mg Oral Daily  . amLODipine  5 mg Oral Daily  . aspirin  81 mg Oral Daily  . carbidopa-levodopa  1 tablet Oral QHS  . carbidopa-levodopa  2 tablet Oral TID with meals  . donepezil  10 mg Oral QHS  . feeding supplement  237 mL Oral BID BM  . insulin aspart  0-5 Units Subcutaneous QHS  . insulin aspart  0-9 Units Subcutaneous TID WC  . metoprolol tartrate  25 mg Oral BID  . multivitamin with minerals  1 tablet Oral Daily  . nystatin   Topical TID  . pantoprazole  40 mg Oral Daily  . senna-docusate  2  tablet Oral BID  . sodium chloride flush  3 mL Intravenous Q12H   Infusions:  . sodium chloride Stopped (09/29/20 2351)  . heparin 1,050 Units/hr (10/04/20 0008)    Assessment: 79 yo F to start Heparin drip for Atrial Fibrillation. Patient was on apixaban (last dose was 5 mg on 12/26 at 0810) now with possible plan for pacemaker. Hgb 10.1  Plt 264   APTT 26   Heparin level 1.20  12/27 2145 aPTT 24  Increase drip to 1050 units/hr   Goal of Therapy:  HDW: 57.1 kg Heparin level 0.3-0.7 units/ml aPTT 66-102 seconds Monitor platelets by anticoagulation protocol: Yes   Plan:  12/28 @ 0914 aPTT=92. Therapeutic. Will continue current drip rate of 1050 units/hr. Check confirmatory aPTT in 8 hrs. -will use aPTT to monitor (and check HL once a day to assess for when they begin to correlate)  1/29 PharmD Clinical Pharmacist 10/04/2020

## 2020-10-04 NOTE — Progress Notes (Signed)
[facility] Encounter Note  Patient: Melinda Maxwell / Admit Date: 09/28/2020 / Date of Encounter: 10/04/2020, 9:00 AM   Subjective: 12/24.  Patient has rested well overnight with no further evidence of significant anginal symptoms or congestive heart failure syncope or falls.  The patient did have atrial fibrillation with rapid ventricular rate on admission but now has spontaneously converted to normal sinus rhythm with preventricular contractions.  There is no evidence of advanced heart block or significant pauses by telemetry.  The patient has had chronic diastolic dysfunction heart failure but currently has no evidence of significant symptoms of that.  Troponin levels have been 24/24 consistent with demand ischemia rather than acute coronary syndrome.  The patient does have urinary tract infection which may have influenced her weakness fatigue and falls.  12/25.  Patient rested well overnight but has not been ambulating throughout the day to assess for any weakness and fatigue and or other concerns.  With Parkinson's she can have some hypotension dizziness weakness and falls.  Additionally she has not had any rhythm disturbances causing these falls based on recent evidence as an outpatient as well is currently here as an inpatient.  Current telemetry does show that she has normal sinus rhythm with frequent preventricular contractions and some short runs of atrial fibrillation.  She is slowly recovering from urinary tract infection  12/26.  Patient overall has felt fairly well since admission to the hospital with no evidence of further significant PND orthopnea syncope dizziness nausea or diaphoresis.  Patient has had episodes of atrial fibrillation with more rapid rate requiring metoprolol use as well as spontaneous conversion to normal sinus rhythm.  Additionally there is some longer RR intervals and longer pauses at least up to 2.6 seconds.  This is most consistent with sick  sinus syndrome which may be contributing to her current issues of weakness fatigue and syncope.  We will need further discussion of the possibility of pacemaker placement including pacemaker with a Micra.  Will consider this a possibility after further adjustments of medication management and discontinuation of anticoagulation for further risk reduction in bleeding complications with procedure.  12/27.  Patient has had an episode of dizziness and weakness while sitting in the bed most consistent with her previous spells that she has had before.  The spells have caused her to have falls as well.  In review of her telemetry it appears that she had a 5 beat run of wide-complex tachycardia which has been seen in the past by Holter monitor.  Additionally previously we have seen supraventricular tachycardia very fast heart rate of atrial fibrillation and slow heart rates with long RR intervals.  All of these could be causing her episodes.  This increases her risks of falls and concerns.  With increased medical management of these the patient will have worsening symptoms of bradycardia and long RR intervals.  We have suggested increasing beta-blocker and potentially using amiodarone at low dose for maintenance of normal sinus rhythm and reduction of significant symptoms and issues related to above.  After secondary consultation and discussion with Dr. Saralyn Pilar there is agreement for the possibility of Micra pacemaker as backup with better management.  We have discussed at length with the family and the patient about Micra pacemaker backup as next treatment.  She understands all the risk and benefits at this and agrees.  12/28.  Patient feels much better today.  No evidence of the episodes of irregular heartbeat that she could not confirm.  The patient has had a long discussion possible medical management yesterday as mapped out above.  She continues to understand and wishes to proceed with current treatment  options  Review of Systems: Positive for: Shortness of breath Negative for: Vision change, hearing change, syncope, dizziness, nausea, vomiting,diarrhea, bloody stool, stomach pain, cough, congestion, diaphoresis, urinary frequency, urinary pain,skin lesions, skin rashes Others previously listed  Objective: Telemetry: Normal sinus rhythm with preventricular contractions Physical Exam: Blood pressure (!) 149/71, pulse 87, temperature 98.2 F (36.8 C), resp. rate 16, height 5\' 1"  (1.549 m), weight 56.1 kg, SpO2 98 %. Body mass index is 23.37 kg/m. General: Well developed, well nourished, in no acute distress. Head: Normocephalic, atraumatic, sclera non-icteric, no xanthomas, nares are without discharge. Neck: No apparent masses Lungs: Normal respirations with no wheezes, no rhonchi, no rales , no crackles   Heart: Irregular rate and rhythm, normal S1 S2, no murmur, no rub, no gallop, PMI is normal size and placement, carotid upstroke normal without bruit, jugular venous pressure normal Abdomen: Soft, non-tender, non-distended with normoactive bowel sounds. No hepatosplenomegaly. Abdominal aorta is normal size without bruit Extremities: Trace edema, no clubbing, no cyanosis, no ulcers,  Peripheral: 2+ radial, 2+ femoral, 2+ dorsal pedal pulses Neuro: Alert and oriented. Moves all extremities spontaneously. Psych:  Responds to questions appropriately with a normal affect.   Intake/Output Summary (Last 24 hours) at 10/04/2020 0900 Last data filed at 10/04/2020 0400 Gross per 24 hour  Intake 461.79 ml  Output --  Net 461.79 ml    Inpatient Medications:  . allopurinol  300 mg Oral Daily  . amLODipine  5 mg Oral Daily  . aspirin  81 mg Oral Daily  . carbidopa-levodopa  1 tablet Oral QHS  . carbidopa-levodopa  2 tablet Oral TID with meals  . donepezil  10 mg Oral QHS  . feeding supplement  237 mL Oral BID BM  . insulin aspart  0-5 Units Subcutaneous QHS  . insulin aspart  0-9 Units  Subcutaneous TID WC  . metoprolol tartrate  25 mg Oral BID  . multivitamin with minerals  1 tablet Oral Daily  . nystatin   Topical TID  . pantoprazole  40 mg Oral Daily  . senna-docusate  2 tablet Oral BID  . sodium chloride flush  3 mL Intravenous Q12H   Infusions:  . sodium chloride Stopped (09/29/20 2351)  . heparin 1,050 Units/hr (10/04/20 0008)    Labs: No results for input(s): NA, K, CL, CO2, GLUCOSE, BUN, CREATININE, CALCIUM, MG, PHOS in the last 72 hours. No results for input(s): AST, ALT, ALKPHOS, BILITOT, PROT, ALBUMIN in the last 72 hours. Recent Labs    10/03/20 1427 10/04/20 0336  WBC 7.8 7.0  HGB 10.1* 9.7*  HCT 30.9* 29.0*  MCV 92.2 91.5  PLT 264 227   No results for input(s): CKTOTAL, CKMB, TROPONINI in the last 72 hours. Invalid input(s): POCBNP No results for input(s): HGBA1C in the last 72 hours.   Weights: Filed Weights   09/30/20 0412 10/03/20 0500 10/04/20 0439  Weight: 57.9 kg 57.1 kg 56.1 kg     Radiology/Studies:  DG Chest 2 View  Result Date: 09/28/2020 CLINICAL DATA:  Dizziness. EXAM: CHEST - 2 VIEW COMPARISON:  October 15, 2019 FINDINGS: Chronic scarring is seen within the lateral aspect of the left lung base. There is no evidence of acute infiltrate, pleural effusion or pneumothorax. The heart size and mediastinal contours are within normal limits. A chronic deformity is seen involving the proximal  left humerus. Degenerative changes seen throughout the thoracic spine. IMPRESSION: Chronic left basilar scarring without acute or active cardiopulmonary disease. Electronically Signed   By: Virgina Norfolk M.D.   On: 09/28/2020 23:52   DG Thoracic Spine 2 View  Result Date: 09/28/2020 CLINICAL DATA:  Status post fall. EXAM: THORACIC SPINE 2 VIEWS COMPARISON:  None. FINDINGS: There is no evidence of an acute thoracic spine fracture. There is mild levoscoliosis of the lower thoracic spine. Mild-to-moderate severity multilevel endplate sclerosis is  seen with multilevel intervertebral disc space narrowing. No other significant bone abnormalities are identified. IMPRESSION: 1. Mild-to-moderate severity multilevel degenerative changes without an acute osseous abnormality. Electronically Signed   By: Virgina Norfolk M.D.   On: 09/28/2020 23:53   DG Lumbar Spine Complete  Result Date: 09/28/2020 CLINICAL DATA:  Status post fall. EXAM: LUMBAR SPINE - COMPLETE 4+ VIEW COMPARISON:  None. FINDINGS: There is no evidence of an acute lumbar spine fracture. There is marked severity dextroscoliosis of the lumbar spine. There is moderate to marked severity multilevel endplate sclerosis with moderate to marked severity multilevel intervertebral disc space narrowing. Multiple radiopaque surgical clips and surgical sutures are seen within the pelvis. IMPRESSION: 1. Marked severity dextroscoliosis of the lumbar spine with moderate to marked severity multilevel degenerative disc disease. Electronically Signed   By: Virgina Norfolk M.D.   On: 09/28/2020 23:54   CT Head Wo Contrast  Result Date: 09/29/2020 CLINICAL DATA:  Fall EXAM: CT HEAD WITHOUT CONTRAST CT CERVICAL SPINE WITHOUT CONTRAST TECHNIQUE: Multidetector CT imaging of the head and cervical spine was performed following the standard protocol without intravenous contrast. Multiplanar CT image reconstructions of the cervical spine were also generated. COMPARISON:  CT head 07/22/2019, 07/17/2019, CT head and cervical spine 07/16/2019 FINDINGS: CT HEAD FINDINGS Brain: No evidence of acute infarction, hemorrhage, hydrocephalus, extra-axial collection, visible mass lesion or mass effect. Symmetric prominence of the ventricles, cisterns and sulci compatible with parenchymal volume loss. Patchy areas of white matter hypoattenuation are most compatible with chronic microvascular angiopathy. Vascular: Atherosclerotic calcification of the carotid siphons and intradural vertebral arteries. No hyperdense vessel. Skull:  No calvarial fracture or suspicious osseous lesion. No scalp swelling or hematoma. Sinuses/Orbits: Pneumatized secretions in the right maxillary and sphenoid sinuses. Additional mild pansinus mural disease and small bilateral mastoid effusions. Orbital structures are unremarkable aside from prior lens extractions. Other: None. CT CERVICAL SPINE FINDINGS Alignment: Stabilization collar is absent at the time of examination. There is mild rightward lateral cervical flexion and slight rightward cranial rotation. Unchanged degenerative anterolisthesis C3-C5 retrolisthesis C5 on 6. No evidence of traumatic listhesis. No abnormally widened, perched or jumped facets. Normal alignment of the craniocervical articulations. Arthrosis and narrowing of the atlantodental interval but with otherwise normal alignment of the atlantoaxial articulations. Skull base and vertebrae: No acute skull base fracture. No vertebral body fracture or height loss. Normal bone mineralization. No worrisome osseous lesions. Moderate arthrosis at the atlantodental and basion dens interval with some callus formation. Appearance is similar to prior. Multilevel cervical spondylitic changes are described below. Soft tissues and spinal canal: No pre or paravertebral fluid or swelling. No visible canal hematoma. Cervical carotid atherosclerosis at the level of the bifurcations. Airways are patent. Disc levels: Multilevel cervical spondylitic changes are present, maximal C5-C7. Slightly larger disc osteophyte complexes present C4-5 and C6-7 partially efface the ventral thecal sac with spurring, retrolisthesis and some ligamentum flavum infolding at C5-6 resulting in mild canal stenosis. Multilevel uncinate spurring facet hypertrophic changes are present resulting in  mild-to-moderate multilevel neural foraminal narrowing with more moderate to severe narrowing bilaterally C5-6 as well. Upper chest: No acute abnormality in the upper chest or imaged lung apices.  Other: Normal thyroid. IMPRESSION: 1. No acute intracranial abnormality. No acute scalp swelling or calvarial fracture. 2. Stable parenchymal volume loss and chronic microvascular ischemic white matter disease. 3. Pneumatized secretions in the right maxillary and sphenoid sinuses. Additional mild pansinus mural disease and small bilateral mastoid effusions. Correlate for features of acute sinusitis. 4. No acute fracture or traumatic listhesis of the cervical spine. 5. Multilevel cervical spondylitic and facet degenerative changes, maximal C5-C7, as above. 6. Cervical and intracranial atherosclerosis. Electronically Signed   By: Lovena Le M.D.   On: 09/29/2020 00:20   CT Cervical Spine Wo Contrast  Result Date: 09/29/2020 CLINICAL DATA:  Fall EXAM: CT HEAD WITHOUT CONTRAST CT CERVICAL SPINE WITHOUT CONTRAST TECHNIQUE: Multidetector CT imaging of the head and cervical spine was performed following the standard protocol without intravenous contrast. Multiplanar CT image reconstructions of the cervical spine were also generated. COMPARISON:  CT head 07/22/2019, 07/17/2019, CT head and cervical spine 07/16/2019 FINDINGS: CT HEAD FINDINGS Brain: No evidence of acute infarction, hemorrhage, hydrocephalus, extra-axial collection, visible mass lesion or mass effect. Symmetric prominence of the ventricles, cisterns and sulci compatible with parenchymal volume loss. Patchy areas of white matter hypoattenuation are most compatible with chronic microvascular angiopathy. Vascular: Atherosclerotic calcification of the carotid siphons and intradural vertebral arteries. No hyperdense vessel. Skull: No calvarial fracture or suspicious osseous lesion. No scalp swelling or hematoma. Sinuses/Orbits: Pneumatized secretions in the right maxillary and sphenoid sinuses. Additional mild pansinus mural disease and small bilateral mastoid effusions. Orbital structures are unremarkable aside from prior lens extractions. Other: None. CT  CERVICAL SPINE FINDINGS Alignment: Stabilization collar is absent at the time of examination. There is mild rightward lateral cervical flexion and slight rightward cranial rotation. Unchanged degenerative anterolisthesis C3-C5 retrolisthesis C5 on 6. No evidence of traumatic listhesis. No abnormally widened, perched or jumped facets. Normal alignment of the craniocervical articulations. Arthrosis and narrowing of the atlantodental interval but with otherwise normal alignment of the atlantoaxial articulations. Skull base and vertebrae: No acute skull base fracture. No vertebral body fracture or height loss. Normal bone mineralization. No worrisome osseous lesions. Moderate arthrosis at the atlantodental and basion dens interval with some callus formation. Appearance is similar to prior. Multilevel cervical spondylitic changes are described below. Soft tissues and spinal canal: No pre or paravertebral fluid or swelling. No visible canal hematoma. Cervical carotid atherosclerosis at the level of the bifurcations. Airways are patent. Disc levels: Multilevel cervical spondylitic changes are present, maximal C5-C7. Slightly larger disc osteophyte complexes present C4-5 and C6-7 partially efface the ventral thecal sac with spurring, retrolisthesis and some ligamentum flavum infolding at C5-6 resulting in mild canal stenosis. Multilevel uncinate spurring facet hypertrophic changes are present resulting in mild-to-moderate multilevel neural foraminal narrowing with more moderate to severe narrowing bilaterally C5-6 as well. Upper chest: No acute abnormality in the upper chest or imaged lung apices. Other: Normal thyroid. IMPRESSION: 1. No acute intracranial abnormality. No acute scalp swelling or calvarial fracture. 2. Stable parenchymal volume loss and chronic microvascular ischemic white matter disease. 3. Pneumatized secretions in the right maxillary and sphenoid sinuses. Additional mild pansinus mural disease and small  bilateral mastoid effusions. Correlate for features of acute sinusitis. 4. No acute fracture or traumatic listhesis of the cervical spine. 5. Multilevel cervical spondylitic and facet degenerative changes, maximal C5-C7, as above. 6. Cervical and  intracranial atherosclerosis. Electronically Signed   By: Lovena Le M.D.   On: 09/29/2020 00:20   CT ABDOMEN PELVIS W CONTRAST  Result Date: 09/29/2020 CLINICAL DATA:  Status post fall and dizziness. EXAM: CT ABDOMEN AND PELVIS WITH CONTRAST TECHNIQUE: Multidetector CT imaging of the abdomen and pelvis was performed using the standard protocol following bolus administration of intravenous contrast. CONTRAST:  121mL OMNIPAQUE IOHEXOL 300 MG/ML  SOLN COMPARISON:  None. FINDINGS: Lower chest: No acute abnormality. Hepatobiliary: No focal liver abnormality is seen. No gallstones, gallbladder wall thickening, or biliary dilatation. Pancreas: Unremarkable. No pancreatic ductal dilatation or surrounding inflammatory changes. Spleen: Normal in size without focal abnormality. Adrenals/Urinary Tract: Adrenal glands are unremarkable. Kidneys are normal, without renal calculi, focal lesion, or hydronephrosis. Bladder is unremarkable. Stomach/Bowel: There is a small hiatal hernia. The appendix is surgically absent. No evidence of bowel wall thickening, distention, or inflammatory changes. Noninflamed diverticula are seen throughout the large bowel. Vascular/Lymphatic: Moderate severity aortic calcification and tortuosity. No enlarged abdominal or pelvic lymph nodes. Reproductive: Status post hysterectomy. No adnexal masses. Other: No abdominal wall hernia or abnormality. Multiple surgical clips are seen within the mid and lower left abdomen and posterior aspect of the mid to lower pelvis. No abdominopelvic ascites. Musculoskeletal: There is marked severity dextroscoliosis of the lumbar spine with marked severity multilevel degenerative changes. IMPRESSION: 1. Small hiatal  hernia. 2. Colonic diverticulosis. 3. Marked severity dextroscoliosis of the lumbar spine with marked severity multilevel degenerative changes. 4. Aortic atherosclerosis. Aortic Atherosclerosis (ICD10-I70.0). Electronically Signed   By: Virgina Norfolk M.D.   On: 09/29/2020 01:34     Assessment and Recommendation  79 y.o. female with known diastolic dysfunction congestive heart failure coronary artery atherosclerosis anemia and paroxysmal nonvalvular atrial fibrillation having a urinary tract infection weakness and falling with improvements at this time.  Telemetry has shown evidence of sick sinus syndrome including long RR intervals and rapid ventricular rate and rare episodes of 5 beat runs of wide-complex tachycardia requiring better medication management as well as adjustments including the possibility of pacemaker placement  1.  Continue to monitor for atrial fibrillation sinus pauses or any other rhythm disturbances that could cause episodes of syncope or weakness.  We will continue metoprolol at this time for maintenance of normal sinus rhythm.  Will further discuss the possibility of Micra pacemaker placement due to sick sinus syndrome and better management of current rehospitalization's.  Patient understands the risk and benefits of Micra pacemaker placement.  This includes the possibility of death stroke infection bleeding blood clot hemopericardium.  Patient is at low risk for conscious sedation.  The patient will have scheduled Micra leadless pacemaker for tomorrow which is Wednesday 2.  Patient will continue heparin until tomorrow for reduce risk of stroke with atrial fibrillation and then reinstatement of anticoagulation thereafter 3.  Have discontinued of statin therapy at this time due to concerns of weakness fatigue and possible side effects contributing to above 4.  Okay for continued hypertension control with amlodipine although will consider the discontinuation if there is lower blood  pressure 5.  No further cardiac diagnostics necessary at this time 6.  Continue treatment of urinary tract infection   Signed, Serafina Royals M.D. FACC

## 2020-10-04 NOTE — Progress Notes (Signed)
PROGRESS NOTE    Melinda Maxwell  NOM:[id number] DOB: 10-Mar-1941 DOA: 09/28/2020 PCP: Lauro Regulus, MD   Chief complaint.  Syncope. Brief Narrative:  Melinda Maxwell a 79 y.o.femalewith medical history significant forCAD, diastolic CHF, paroxysmal A. fib on Xarelto, Parkinson's disease, chronic dizziness and physical deconditioning, HTN,who was brought to the hospital after 2 episodes of syncope at homewithfall. She previously had a similar episodes last year. She was also found to have UTI, started on Rocephin. Patient was given a dose of fosfomycin on 12/24 to complete antibiotics. Patient has been seen by cardiology, scheduled pacemaker 12/29.   Assessment & Plan:   Active Problems:   UTI (urinary tract infection)   CAD (coronary artery disease)   Benign essential hypertension   Paroxysmal atrial fibrillation (HCC)   Chronic diastolic CHF (congestive heart failure) (HCC)   Syncope   Hypokalemia   Type 2 diabetes mellitus without complication (HCC)   Chronic anticoagulation   Parkinson disease (HCC)  #1.  Syncope. Sick sinus syndrome. Patient is being seen by cardiology, deemed necessary for pacemaker. Scheduled or Micra leadless pacemaker 12/29.  2.  UTI. Antibiotics completed.  3.  Paroxysmal atrial fibrillation Chronic diastolic congestive heart failure. Patient does not have any volume overload.  Currently patient is on heparin drip, will transition to Eliquis after pacemaker is placed.  4.  Nonsustained VT. Followed by cardiology.  5.  Parkinson disease. Continue home medicines    DVT prophylaxis: Heparin drip Code Status: Full Family Communication:  Disposition Plan:  .   Status is: Inpatient  Remains inpatient appropriate because:Ongoing diagnostic testing needed not appropriate for outpatient work up  Pacemaker tomorrow.   Dispo: The patient is from: Home              Anticipated d/c is to: SNF              Anticipated d/c date  is: 1 day              Patient currently is not medically stable to d/c.        I/O last 3 completed shifts: In: 1061.8 [P.O.:1020; I.V.:41.8] Out: 300 [Urine:300] No intake/output data recorded.     Consultants:   Cardiology.  Procedures: Full  Antimicrobials: None  Subjective: Patient does not have any complaints today.  She feels well, denies any short of breath or chest pain.  No cough.  No abdominal pain or nausea vomiting.  No diarrhea or constipation.  She states that she had a bowel movement.  Objective: Vitals:   10/03/20 2324 10/04/20 0402 10/04/20 0439 10/04/20 0900  BP: (!) 134/55 (!) 149/71  (!) 167/87  Pulse: 76 87  87  Resp: 20 16    Temp: 98.6 F (37 C) 98.2 F (36.8 C)  97.7 F (36.5 C)  TempSrc: Oral   Oral  SpO2: 98% 98%  99%  Weight:   56.1 kg   Height:        Intake/Output Summary (Last 24 hours) at 10/04/2020 1015 Last data filed at 10/04/2020 0400 Gross per 24 hour  Intake 461.79 ml  Output --  Net 461.79 ml   Filed Weights   09/30/20 0412 10/03/20 0500 10/04/20 0439  Weight: 57.9 kg 57.1 kg 56.1 kg    Examination:  General exam: Appears calm and comfortable  Respiratory system: Clear to auscultation. Respiratory effort normal. Cardiovascular system: S1 & S2 heard, RRR. No JVD, murmurs, rubs, gallops or clicks. No pedal edema. Gastrointestinal system:  Abdomen is nondistended, soft and nontender. No organomegaly or masses felt. Normal bowel sounds heard. Central nervous system: Alert and oriented. No focal neurological deficits. Extremities: Symmetric  Skin: No rashes, lesions or ulcers Psychiatry: Judgement and insight appear normal. Mood & affect appropriate.     Data Reviewed: I have personally reviewed following labs and imaging studies  CBC: Recent Labs  Lab 09/28/20 1221 09/29/20 0438 10/03/20 1427 10/04/20 0336  WBC 6.1 7.1 7.8 7.0  HGB 10.7* 10.1* 10.1* 9.7*  HCT 32.3* 29.6* 30.9* 29.0*  MCV 91.0 90.2 92.2  91.5  PLT 253 235 264 621   Basic Metabolic Panel: Recent Labs  Lab 09/28/20 1221 09/29/20 0438  NA 135 136  K 2.8* 3.9  CL 96* 100  CO2 26 25  GLUCOSE 103* 98  BUN 17 18  CREATININE 0.94 0.94  CALCIUM 9.7 9.4   GFR: Estimated Creatinine Clearance: 35.4 mL/min (by C-G formula based on SCr of 0.94 mg/dL). Liver Function Tests: No results for input(s): AST, ALT, ALKPHOS, BILITOT, PROT, ALBUMIN in the last 168 hours. No results for input(s): LIPASE, AMYLASE in the last 168 hours. No results for input(s): AMMONIA in the last 168 hours. Coagulation Profile: No results for input(s): INR, PROTIME in the last 168 hours. Cardiac Enzymes: Recent Labs  Lab 09/28/20 1221  CKTOTAL 29*   BNP (last 3 results) No results for input(s): PROBNP in the last 8760 hours. HbA1C: No results for input(s): HGBA1C in the last 72 hours. CBG: Recent Labs  Lab 10/03/20 0739 10/03/20 1215 10/03/20 1700 10/03/20 2159 10/04/20 0744  GLUCAP 100* 136* 134* 166* 78   Lipid Profile: No results for input(s): CHOL, HDL, LDLCALC, TRIG, CHOLHDL, LDLDIRECT in the last 72 hours. Thyroid Function Tests: No results for input(s): TSH, T4TOTAL, FREET4, T3FREE, THYROIDAB in the last 72 hours. Anemia Panel: No results for input(s): VITAMINB12, FOLATE, FERRITIN, TIBC, IRON, RETICCTPCT in the last 72 hours. Sepsis Labs: No results for input(s): PROCALCITON, LATICACIDVEN in the last 168 hours.  Recent Results (from the past 240 hour(s))  Resp Panel by RT-PCR (Flu A&B, Covid) Nasopharyngeal Swab     Status: None   Collection Time: 09/29/20 12:22 AM   Specimen: Nasopharyngeal Swab; Nasopharyngeal(NP) swabs in vial transport medium  Result Value Ref Range Status   SARS Coronavirus 2 by RT PCR NEGATIVE NEGATIVE Final    Comment: (NOTE) SARS-CoV-2 target nucleic acids are NOT DETECTED.  The SARS-CoV-2 RNA is generally detectable in upper respiratory specimens during the acute phase of infection. The  lowest concentration of SARS-CoV-2 viral copies this assay can detect is 138 copies/mL. A negative result does not preclude SARS-Cov-2 infection and should not be used as the sole basis for treatment or other patient management decisions. A negative result may occur with  improper specimen collection/handling, submission of specimen other than nasopharyngeal swab, presence of viral mutation(s) within the areas targeted by this assay, and inadequate number of viral copies(<138 copies/mL). A negative result must be combined with clinical observations, patient history, and epidemiological information. The expected result is Negative.  Fact Sheet for Patients:  EntrepreneurPulse.com.au  Fact Sheet for Healthcare Providers:  IncredibleEmployment.be  This test is no t yet approved or cleared by the [place] FDA and  has been authorized for detection and/or diagnosis of SARS-CoV-2 by FDA under an Emergency Use Authorization (EUA). This EUA will remain  in effect (meaning this test can be used) for the duration of the COVID-19 declaration under Section 564(b)(1) of the Act,  21 U.S.C.section 360bbb-3(b)(1), unless the authorization is terminated  or revoked sooner.       Influenza A by PCR NEGATIVE NEGATIVE Final   Influenza B by PCR NEGATIVE NEGATIVE Final    Comment: (NOTE) The Xpert Xpress SARS-CoV-2/FLU/RSV plus assay is intended as an aid in the diagnosis of influenza from Nasopharyngeal swab specimens and should not be used as a sole basis for treatment. Nasal washings and aspirates are unacceptable for Xpert Xpress SARS-CoV-2/FLU/RSV testing.  Fact Sheet for Patients: BloggerCourse.com  Fact Sheet for Healthcare Providers: SeriousBroker.it  This test is not yet approved or cleared by the [place] FDA and has been authorized for detection and/or diagnosis of SARS-CoV-2 by FDA under  an Emergency Use Authorization (EUA). This EUA will remain in effect (meaning this test can be used) for the duration of the COVID-19 declaration under Section 564(b)(1) of the Act, 21 U.S.C. section 360bbb-3(b)(1), unless the authorization is terminated or revoked.  Performed at [facility], [address], [place], [place] [address]          Radiology Studies: No results found.      Scheduled Meds: . allopurinol  300 mg Oral Daily  . amLODipine  5 mg Oral Daily  . aspirin  81 mg Oral Daily  . carbidopa-levodopa  1 tablet Oral QHS  . carbidopa-levodopa  2 tablet Oral TID with meals  . donepezil  10 mg Oral QHS  . feeding supplement  237 mL Oral BID BM  . insulin aspart  0-5 Units Subcutaneous QHS  . insulin aspart  0-9 Units Subcutaneous TID WC  . metoprolol tartrate  25 mg Oral BID  . multivitamin with minerals  1 tablet Oral Daily  . nystatin   Topical TID  . pantoprazole  40 mg Oral Daily  . senna-docusate  2 tablet Oral BID  . sodium chloride flush  3 mL Intravenous Q12H   Continuous Infusions: . sodium chloride Stopped (09/29/20 2351)  . heparin 1,050 Units/hr (10/04/20 0008)     LOS: 5 days    Time spent: 25 minutes    Marrion Coy, MD [facility]   To contact the attending provider between 7A-7P or the covering provider during after hours 7P-7A, please log into the web site [email or web address] and access using universal [place] password for that web site. If you do not have the password, please call the hospital operator.  10/04/2020, 10:15 AM

## 2020-10-04 NOTE — TOC Progression Note (Signed)
Transition of Care [facility]) - Progression Note    Patient Details  Name: Melinda Maxwell MRN: [id number] Date of Birth: [birth year]  Transition of Care [facility]) CM/SW Contact  Chapman Fitch, RN Phone Number: 10/04/2020, 1:33 PM  Clinical Narrative:      [facility] unable to accept patients at this time.  Discussed options with patient and daughter.   They have accepted be at [facility] in [place]  Anticipated discharge tomorrow.  Notified daughter and Duwayne Heck at [facility] [facility].   Insurance auth started through portal reference # [id number]      Expected Discharge Plan and Services                                                 Social Determinants of Health (SDOH) Interventions    Readmission Risk Interventions No flowsheet data found.

## 2020-10-04 NOTE — Progress Notes (Signed)
ANTICOAGULATION CONSULT NOTE  Pharmacy Consult for Heparin drip  (previously on apixaban) Indication: atrial fibrillation  Allergies  Allergen Reactions  . Benzocaine Other (See Comments)    Unknown  . Lisinopril Cough  . Neomycin Other (See Comments)    On allergy testing  . Sulfamethoxazole-Trimethoprim Other (See Comments)    Unknown  . Sulfa Antibiotics Rash    Patient Measurements: Height: 5\' 1"  (154.9 cm) Weight: 56.1 kg (123 lb 10.9 oz) IBW/kg (Calculated) : 47.8 Heparin Dosing Weight:    Vital Signs: Temp: 98.2 F (36.8 C) (12/28 1606) Temp Source: Oral (12/28 1606) BP: 126/76 (12/28 1606) Pulse Rate: 51 (12/28 1606)  Labs: Recent Labs    10/03/20 1427 10/03/20 2145 10/04/20 0336 10/04/20 0914 10/04/20 1613  HGB 10.1*  --  9.7*  --   --   HCT 30.9*  --  29.0*  --   --   PLT 264  --  227  --   --   APTT 26 24  --  92* 62*  HEPARINUNFRC 1.20*  --  1.10*  --   --     Estimated Creatinine Clearance: 35.4 mL/min (by C-G formula based on SCr of 0.94 mg/dL).   Medical History: Past Medical History:  Diagnosis Date  . Arthritis   . Asthma without status asthmaticus    unspecified  . Breast cancer (HCC)   . Coronary artery disease   . Diverticulosis   . DVT (deep venous thrombosis) (HCC)   . Edema   . Gout   . Hyperlipidemia   . Hypertension   . MI, old   . Myocardial infarct (HCC) 2004  . Parkinson's disease (HCC)   . Sleep apnea      Assessment: 79 yo F to start Heparin drip for Atrial Fibrillation. Patient was on apixaban (last dose was 5 mg on 12/26 at 0810) now with possible plan for pacemaker. Hgb 10.1  Plt 264   APTT 26   Heparin level 1.20  12/27 2145 aPTT 24  Increase drip to 1050 units/hr 12/28 0914 aPTT 92  Therapeutic 12/28 1613 aPTT 62  Subtherapeutic, increase drip to 1150 units/hr   Goal of Therapy:  HDW: 57.1 kg Heparin level 0.3-0.7 units/ml aPTT 66-102 seconds Monitor platelets by anticoagulation protocol: Yes    Plan:  APTT subtherapeutic. Increase heparin drip to 1150 units/hr. Recheck aPTT 12/29 at 0200. CBC daily while on heparin drip. Will use aPTT to monitor until correlation with HL.  1/30, PharmD Clinical Pharmacist 10/04/2020

## 2020-10-05 ENCOUNTER — Encounter
Admission: EM | Disposition: A | Discharge status: Skilled Nursing Facility | Payer: Self-pay | Source: Home / Self Care | Attending: Internal Medicine

## 2020-10-05 ENCOUNTER — Encounter: Payer: Self-pay | Admitting: Cardiology

## 2020-10-05 DIAGNOSIS — R55 Syncope and collapse: Secondary | ICD-10-CM

## 2020-10-05 DIAGNOSIS — I480 Paroxysmal atrial fibrillation: Secondary | ICD-10-CM

## 2020-10-05 DIAGNOSIS — I2510 Atherosclerotic heart disease of native coronary artery without angina pectoris: Secondary | ICD-10-CM

## 2020-10-05 DIAGNOSIS — G20 Parkinson's disease: Secondary | ICD-10-CM

## 2020-10-05 DIAGNOSIS — I495 Sick sinus syndrome: Secondary | ICD-10-CM | POA: Diagnosis not present

## 2020-10-05 HISTORY — PX: PACEMAKER LEADLESS INSERTION: EP1219

## 2020-10-05 LAB — BASIC METABOLIC PANEL
Anion gap: 8 (ref 5–15)
BUN: 32 mg/dL — ABNORMAL HIGH (ref 8–23)
CO2: 27 mmol/L (ref 22–32)
Calcium: 9.6 mg/dL (ref 8.9–10.3)
Chloride: 101 mmol/L (ref 98–111)
Creatinine, Ser: 1 mg/dL (ref 0.44–1.00)
GFR, Estimated: 57 mL/min — ABNORMAL LOW (ref >60)
Glucose, Bld: 129 mg/dL — ABNORMAL HIGH (ref 70–99)
Potassium: 4.6 mmol/L (ref 3.5–5.1)
Sodium: 136 mmol/L (ref 135–145)

## 2020-10-05 LAB — CBC WITH DIFFERENTIAL/PLATELET
Abs Immature Granulocytes: 0.04 10*3/uL (ref 0.00–0.07)
Basophils Absolute: 0.1 10*3/uL (ref 0.0–0.1)
Basophils Relative: 1 %
Eosinophils Absolute: 0.2 10*3/uL (ref 0.0–0.5)
Eosinophils Relative: 2 %
HCT: 28.6 % — ABNORMAL LOW (ref 36.0–46.0)
Hemoglobin: 9.2 g/dL — ABNORMAL LOW (ref 12.0–15.0)
Immature Granulocytes: 1 %
Lymphocytes Relative: 36 %
Lymphs Abs: 2.8 10*3/uL (ref 0.7–4.0)
MCH: 30.1 pg (ref 26.0–34.0)
MCHC: 32.2 g/dL (ref 30.0–36.0)
MCV: 93.5 fL (ref 80.0–100.0)
Monocytes Absolute: 0.6 10*3/uL (ref 0.1–1.0)
Monocytes Relative: 8 %
Neutro Abs: 4.1 10*3/uL (ref 1.7–7.7)
Neutrophils Relative %: 52 %
Platelets: 226 10*3/uL (ref 150–400)
RBC: 3.06 MIL/uL — ABNORMAL LOW (ref 3.87–5.11)
RDW: 13.9 % (ref 11.5–15.5)
WBC: 7.6 10*3/uL (ref 4.0–10.5)
nRBC: 0 % (ref 0.0–0.2)

## 2020-10-05 LAB — HEPARIN LEVEL (UNFRACTIONATED): Heparin Unfractionated: 0.71 IU/mL — ABNORMAL HIGH (ref 0.30–0.70)

## 2020-10-05 LAB — GLUCOSE, CAPILLARY: Glucose-Capillary: 82 mg/dL (ref 70–99)

## 2020-10-05 LAB — MAGNESIUM: Magnesium: 1.5 mg/dL — ABNORMAL LOW (ref 1.7–2.4)

## 2020-10-05 LAB — APTT: aPTT: 106 seconds — ABNORMAL HIGH (ref 24–36)

## 2020-10-05 SURGERY — PACEMAKER LEADLESS INSERTION
Anesthesia: Moderate Sedation

## 2020-10-05 MED ORDER — FENTANYL CITRATE (PF) 100 MCG/2ML IJ SOLN
INTRAMUSCULAR | Status: DC | PRN
  Administered 2020-10-05 (×2): 25 ug via INTRAVENOUS

## 2020-10-05 MED ORDER — SODIUM CHLORIDE 0.45 % IV SOLN: INTRAVENOUS | Status: DC

## 2020-10-05 MED ORDER — FENTANYL CITRATE (PF) 100 MCG/2ML IJ SOLN
INTRAMUSCULAR | Status: AC
  Filled 2020-10-05: qty 2

## 2020-10-05 MED ORDER — MIDAZOLAM HCL 2 MG/2ML IJ SOLN
INTRAMUSCULAR | Status: AC
  Filled 2020-10-05: qty 2

## 2020-10-05 MED ORDER — SODIUM CHLORIDE 0.9 % IV SOLN: INTRAVENOUS | Status: DC

## 2020-10-05 MED ORDER — HEPARIN SODIUM (PORCINE) 1000 UNIT/ML IJ SOLN
INTRAMUSCULAR | Status: DC | PRN
  Administered 2020-10-05: 3000 [IU] via INTRAVENOUS

## 2020-10-05 MED ORDER — LIDOCAINE HCL (PF) 1 % IJ SOLN
INTRAMUSCULAR | Status: DC | PRN
  Administered 2020-10-05: 30 mL

## 2020-10-05 MED ORDER — HEPARIN (PORCINE) IN NACL 2000-0.9 UNIT/L-% IV SOLN
INTRAVENOUS | Status: DC | PRN
  Administered 2020-10-05: 1000 mL

## 2020-10-05 MED ORDER — DILTIAZEM HCL 25 MG/5ML IV SOLN
INTRAVENOUS | Status: DC | PRN
  Administered 2020-10-05: 15 mg via INTRAVENOUS

## 2020-10-05 MED ORDER — SODIUM CHLORIDE 0.9% FLUSH
3.0000 mL | Freq: Two times a day (BID) | INTRAVENOUS | Status: DC
  Administered 2020-10-05 – 2020-10-06 (×3): 3 mL via INTRAVENOUS

## 2020-10-05 MED ORDER — AMIODARONE HCL 200 MG PO TABS
200.0000 mg | ORAL_TABLET | Freq: Two times a day (BID) | ORAL | Status: DC
  Administered 2020-10-05 – 2020-10-06 (×2): 200 mg via ORAL
  Filled 2020-10-05 (×2): qty 1

## 2020-10-05 MED ORDER — CEFAZOLIN SODIUM-DEXTROSE 2-4 GM/100ML-% IV SOLN: 2.0000 g | INTRAVENOUS | Status: DC

## 2020-10-05 MED ORDER — HEPARIN (PORCINE) 25000 UT/250ML-% IV SOLN: 1100.0000 [IU]/h | INTRAVENOUS | Status: DC

## 2020-10-05 MED ORDER — IOHEXOL 300 MG/ML  SOLN
INTRAMUSCULAR | Status: DC | PRN
  Administered 2020-10-05: 10:00:00 30 mL

## 2020-10-05 MED ORDER — HEPARIN SODIUM (PORCINE) 1000 UNIT/ML IJ SOLN
INTRAMUSCULAR | Status: AC
  Filled 2020-10-05: qty 1

## 2020-10-05 MED ORDER — ACETAMINOPHEN 325 MG PO TABS: 650.0000 mg | ORAL_TABLET | ORAL | Status: DC | PRN

## 2020-10-05 MED ORDER — MAGNESIUM OXIDE 400 (241.3 MG) MG PO TABS
400.0000 mg | ORAL_TABLET | Freq: Every day | ORAL | Status: DC
  Administered 2020-10-06: 12:00:00 400 mg via ORAL
  Filled 2020-10-05: qty 1

## 2020-10-05 MED ORDER — DILTIAZEM HCL 25 MG/5ML IV SOLN
INTRAVENOUS | Status: AC
  Filled 2020-10-05: qty 5

## 2020-10-05 MED ORDER — APIXABAN 2.5 MG PO TABS
2.5000 mg | ORAL_TABLET | Freq: Two times a day (BID) | ORAL | Status: DC
  Administered 2020-10-06: 12:00:00 2.5 mg via ORAL
  Filled 2020-10-05: qty 1

## 2020-10-05 MED ORDER — ONDANSETRON HCL 4 MG/2ML IJ SOLN: 4.0000 mg | Freq: Four times a day (QID) | INTRAMUSCULAR | Status: DC | PRN

## 2020-10-05 MED ORDER — MIDAZOLAM HCL 2 MG/2ML IJ SOLN
INTRAMUSCULAR | Status: DC | PRN
  Administered 2020-10-05 (×2): 1 mg via INTRAVENOUS

## 2020-10-05 MED ORDER — SODIUM CHLORIDE 0.9 % IV SOLN: 250.0000 mL | INTRAVENOUS | Status: DC | PRN

## 2020-10-05 MED ORDER — LIDOCAINE HCL (PF) 1 % IJ SOLN
INTRAMUSCULAR | Status: AC
  Filled 2020-10-05: qty 30

## 2020-10-05 MED ORDER — MAGNESIUM SULFATE 2 GM/50ML IV SOLN
2.0000 g | Freq: Once | INTRAVENOUS | Status: AC
  Administered 2020-10-05: 16:00:00 2 g via INTRAVENOUS
  Filled 2020-10-05: qty 50

## 2020-10-05 MED ORDER — SODIUM CHLORIDE 0.9% FLUSH: 3.0000 mL | INTRAVENOUS | Status: DC | PRN

## 2020-10-05 MED ORDER — HEPARIN (PORCINE) IN NACL 1000-0.9 UT/500ML-% IV SOLN
INTRAVENOUS | Status: AC
  Filled 2020-10-05: qty 1000

## 2020-10-05 SURGICAL SUPPLY — 13 items
DILATOR VESSEL 38 20CM 12FR (INTRODUCER) ×2 IMPLANT
DILATOR VESSEL 38 20CM 14FR (INTRODUCER) ×2 IMPLANT
DILATOR VESSEL 38 20CM 18FR (INTRODUCER) ×2 IMPLANT
DILATOR VESSEL 38 20CM 8FR (INTRODUCER) ×2 IMPLANT
MICRA AV TRANSCATH PACING SYS (Pacemaker) ×2 IMPLANT
MICRA INTRODUCER SHEATH (SHEATH) ×2
NEEDLE PERC 18GX7CM (NEEDLE) ×2 IMPLANT
PACK CARDIAC CATH (CUSTOM PROCEDURE TRAY) ×2 IMPLANT
SHEATH AVANTI 7FRX11 (SHEATH) ×2 IMPLANT
SHEATH INTRODUCER MICRA (SHEATH) ×1 IMPLANT
SYSTEM PACING TRNSCTH AV MICRA (Pacemaker) ×1 IMPLANT
WIRE AMPLATZ SS-J .035X180CM (WIRE) ×2 IMPLANT
WIRE GUIDERIGHT .035X150 (WIRE) ×2 IMPLANT

## 2020-10-05 NOTE — Progress Notes (Signed)
OT Cancellation Note  Patient Details Name: JAKAIYA NETHERLAND MRN: [id number] DOB: 1941/06/12   Cancelled Treatment:    Reason Eval/Treat Not Completed: Medical issues which prohibited therapy. Pt receiving pacemaker this AM, with groin access. Will hold therapy today and continue with OT services as pt is medically ready.   Latina Craver, PhD, MS, OTR/L ascom [phone] 10/05/20, 12:22 PM

## 2020-10-05 NOTE — Progress Notes (Addendum)
[facility] at [place] NAME: Melinda Maxwell    MR#:  [id number]  DATE OF BIRTH:  Jun 14, 1941  SUBJECTIVE:   Denies any complaints. No cp or sob Heparin gtt held for PM placement today REVIEW OF SYSTEMS:   Review of Systems  Constitutional: Negative for chills, fever and weight loss.  HENT: Negative for ear discharge, ear pain and nosebleeds.   Eyes: Negative for blurred vision, pain and discharge.  Respiratory: Negative for sputum production, shortness of breath, wheezing and stridor.   Cardiovascular: Negative for chest pain, palpitations, orthopnea and PND.  Gastrointestinal: Negative for abdominal pain, diarrhea, nausea and vomiting.  Genitourinary: Negative for frequency and urgency.  Musculoskeletal: Positive for falls. Negative for back pain and joint pain.  Neurological: Positive for weakness. Negative for sensory change, speech change and focal weakness.  Psychiatric/Behavioral: Negative for depression and hallucinations. The patient is not nervous/anxious.    Tolerating Diet:npo today Tolerating PT: SNF  DRUG ALLERGIES:   Allergies  Allergen Reactions  . Benzocaine Other (See Comments)    Unknown  . Lisinopril Cough  . Neomycin Other (See Comments)    On allergy testing  . Sulfamethoxazole-Trimethoprim Other (See Comments)    Unknown  . Sulfa Antibiotics Rash    VITALS:  Blood pressure 134/78, pulse (!) 52, temperature 98 F (36.7 C), temperature source Oral, resp. rate 20, height 5\' 1"  (1.549 m), weight 56 kg, SpO2 99 %.  PHYSICAL EXAMINATION:   Physical Exam  GENERAL:  79 y.o.-year-old patient lying in the bed with no acute distress.  LUNGS: Normal breath sounds bilaterally, no wheezing, rales, rhonchi. No use of accessory muscles of respiration.  CARDIOVASCULAR: S1, S2 normal. No murmurs, rubs, or gallops.  ABDOMEN: Soft, nontender, nondistended. Bowel sounds present. No organomegaly or mass.  EXTREMITIES: No  cyanosis, clubbing or edema b/l.    NEUROLOGIC: Cranial nerves II through XII are intact. No focal Motor or sensory deficits b/l.   PSYCHIATRIC:  patient is alert and oriented x 3.  SKIN: No obvious rash, lesion, or ulcer.   LABORATORY PANEL:  CBC Recent Labs  Lab 10/05/20 0226  WBC 7.6  HGB 9.2*  HCT 28.6*  PLT 226    Chemistries  Recent Labs  Lab 10/05/20 0226  NA 136  K 4.6  CL 101  CO2 27  GLUCOSE 129*  BUN 32*  CREATININE 1.00  CALCIUM 9.6  MG 1.5*   Cardiac Enzymes No results for input(s): TROPONINI in the last 168 hours. RADIOLOGY:  No results found. ASSESSMENT AND PLAN:  Melinda Maxwell a 79 y.o.femalewith medical history significant forCAD, diastolic CHF, paroxysmal A. fib on Xarelto, Parkinson's disease, chronic dizziness and physical deconditioning, HTN,who was brought to the hospital after 2 episodes of syncope at homewithfall   Syncope. Sick sinus syndrome -Patient is being seen by cardiology Dr Junius Creamer for pacemaker placement today ( [employer] pacemaker 12/29) - hold IV Heparin gtt. Ok to resume Eliquis from tomorrow per Dr Nehemiah Massed - cont BB  UTI -Antibiotics completed.  Paroxysmal atrial fibrillation Chronic diastolic congestive heart failure -Patient does not have any volume overload. -cont BB     HTN -cont amlodipine  Parkinson disease H/o falls -Continue home medicines -PT consulted for deconditioning and falls. Pt uses walker at home.  Hypomagnesimia -mag 1.5--pharmacy to replace it -will place her on oral mag  DVT prophylaxis: Heparin drip--on hold today. Will resume eliquis from tomorrow Code Status: Full Family Communication: dter [name]  in the room otday Disposition Plan:    Status is: Inpatient  Remains inpatient appropriate because:Ongoing diagnostic testing needed not appropriate for outpatient work up  Pacemaker today   Dispo: The patient is from: Home  Anticipated d/c is  to: SNF--pt's dter accepted bed [facility] [place]  Anticipated d/c date is: 1 day  Patient currently is not medically stable to d/c.            TOTAL TIME TAKING CARE OF THIS PATIENT: *25* minutes.  >50% time spent on counselling and coordination of care  Note: This dictation was prepared with Dragon dictation along with smaller phrase technology. Any transcriptional errors that result from this process are unintentional.  Melinda Maxwell M.D    [facility]   CC: Primary care physician; Melinda Maxwell, MDPatient ID: Melinda Maxwell, female   DOB: 07-17-1941, 79 y.o.   MRN: [id number]

## 2020-10-05 NOTE — Progress Notes (Signed)
[facility] Encounter Note  Patient: Melinda Maxwell / Admit Date: 09/28/2020 / Date of Encounter: 10/05/2020, 8:18 AM   Subjective: 12/24.  Patient has rested well overnight with no further evidence of significant anginal symptoms or congestive heart failure syncope or falls.  The patient did have atrial fibrillation with rapid ventricular rate on admission but now has spontaneously converted to normal sinus rhythm with preventricular contractions.  There is no evidence of advanced heart block or significant pauses by telemetry.  The patient has had chronic diastolic dysfunction heart failure but currently has no evidence of significant symptoms of that.  Troponin levels have been 24/24 consistent with demand ischemia rather than acute coronary syndrome.  The patient does have urinary tract infection which may have influenced her weakness fatigue and falls.  12/25.  Patient rested well overnight but has not been ambulating throughout the day to assess for any weakness and fatigue and or other concerns.  With Parkinson's she can have some hypotension dizziness weakness and falls.  Additionally she has not had any rhythm disturbances causing these falls based on recent evidence as an outpatient as well is currently here as an inpatient.  Current telemetry does show that she has normal sinus rhythm with frequent preventricular contractions and some short runs of atrial fibrillation.  She is slowly recovering from urinary tract infection  12/26.  Patient overall has felt fairly well since admission to the hospital with no evidence of further significant PND orthopnea syncope dizziness nausea or diaphoresis.  Patient has had episodes of atrial fibrillation with more rapid rate requiring metoprolol use as well as spontaneous conversion to normal sinus rhythm.  Additionally there is some longer RR intervals and longer pauses at least up to 2.6 seconds.  This is most consistent with sick  sinus syndrome which may be contributing to her current issues of weakness fatigue and syncope.  We will need further discussion of the possibility of pacemaker placement including pacemaker with a Micra.  Will consider this a possibility after further adjustments of medication management and discontinuation of anticoagulation for further risk reduction in bleeding complications with procedure.  12/27.  Patient has had an episode of dizziness and weakness while sitting in the bed most consistent with her previous spells that she has had before.  The spells have caused her to have falls as well.  In review of her telemetry it appears that she had a 5 beat run of wide-complex tachycardia which has been seen in the past by Holter monitor.  Additionally previously we have seen supraventricular tachycardia very fast heart rate of atrial fibrillation and slow heart rates with long RR intervals.  All of these could be causing her episodes.  This increases her risks of falls and concerns.  With increased medical management of these the patient will have worsening symptoms of bradycardia and long RR intervals.  We have suggested increasing beta-blocker and potentially using amiodarone at low dose for maintenance of normal sinus rhythm and reduction of significant symptoms and issues related to above.  After secondary consultation and discussion with Dr. Saralyn Pilar there is agreement for the possibility of Micra pacemaker as backup with better management.  We have discussed at length with the family and the patient about Micra pacemaker backup as next treatment.  She understands all the risk and benefits at this and agrees.  12/28.  Patient feels much better today.  No evidence of the episodes of irregular heartbeat that she could not confirm.  The patient has had a long discussion possible medical management yesterday as mapped out above.  She continues to understand and wishes to proceed with current treatment  options  12/29.  Patient has had no evidence of issues or concerns overnight.  Still slightly dizzy on occasion.  Patient has had atrial fibrillation with variable heart rate.  Plan for Micra pacemaker today Review of Systems: Positive for: Shortness of breath Negative for: Vision change, hearing change, syncope, dizziness, nausea, vomiting,diarrhea, bloody stool, stomach pain, cough, congestion, diaphoresis, urinary frequency, urinary pain,skin lesions, skin rashes Others previously listed  Objective: Telemetry: Normal sinus rhythm with preventricular contractions Physical Exam: Blood pressure 134/78, pulse (!) 52, temperature 98 F (36.7 C), temperature source Oral, resp. rate 20, height 5\' 1"  (1.549 m), weight 56 kg, SpO2 99 %. Body mass index is 23.33 kg/m. General: Well developed, well nourished, in no acute distress. Head: Normocephalic, atraumatic, sclera non-icteric, no xanthomas, nares are without discharge. Neck: No apparent masses Lungs: Normal respirations with no wheezes, no rhonchi, no rales , no crackles   Heart: Irregular rate and rhythm, normal S1 S2, no murmur, no rub, no gallop, PMI is normal size and placement, carotid upstroke normal without bruit, jugular venous pressure normal Abdomen: Soft, non-tender, non-distended with normoactive bowel sounds. No hepatosplenomegaly. Abdominal aorta is normal size without bruit Extremities: Trace edema, no clubbing, no cyanosis, no ulcers,  Peripheral: 2+ radial, 2+ femoral, 2+ dorsal pedal pulses Neuro: Alert and oriented. Moves all extremities spontaneously. Psych:  Responds to questions appropriately with a normal affect.   Intake/Output Summary (Last 24 hours) at 10/05/2020 0818 Last data filed at 10/05/2020 0500 Gross per 24 hour  Intake 693.51 ml  Output --  Net 693.51 ml    Inpatient Medications:  . allopurinol  300 mg Oral Daily  . amiodarone  200 mg Oral BID  . amLODipine  5 mg Oral Daily  . [START ON  10/06/2020] apixaban  2.5 mg Oral BID  . aspirin  81 mg Oral Daily  . carbidopa-levodopa  1 tablet Oral QHS  . carbidopa-levodopa  2 tablet Oral TID with meals  . donepezil  10 mg Oral QHS  . feeding supplement  237 mL Oral BID BM  . insulin aspart  0-5 Units Subcutaneous QHS  . insulin aspart  0-9 Units Subcutaneous TID WC  . metoprolol tartrate  25 mg Oral BID  . multivitamin with minerals  1 tablet Oral Daily  . nystatin   Topical TID  . pantoprazole  40 mg Oral Daily  . senna-docusate  2 tablet Oral BID  . sodium chloride flush  3 mL Intravenous Q12H   Infusions:  . sodium chloride Stopped (09/29/20 2351)  . magnesium sulfate bolus IVPB      Labs: Recent Labs    10/05/20 0226  NA 136  K 4.6  CL 101  CO2 27  GLUCOSE 129*  BUN 32*  CREATININE 1.00  CALCIUM 9.6  MG 1.5*   No results for input(s): AST, ALT, ALKPHOS, BILITOT, PROT, ALBUMIN in the last 72 hours. Recent Labs    10/04/20 0336 10/05/20 0226  WBC 7.0 7.6  NEUTROABS  --  4.1  HGB 9.7* 9.2*  HCT 29.0* 28.6*  MCV 91.5 93.5  PLT 227 226   No results for input(s): CKTOTAL, CKMB, TROPONINI in the last 72 hours. Invalid input(s): POCBNP No results for input(s): HGBA1C in the last 72 hours.   Weights: Filed Weights   10/03/20 0500 10/04/20 0439 10/05/20  0453  Weight: 57.1 kg 56.1 kg 56 kg     Radiology/Studies:  DG Chest 2 View  Result Date: 09/28/2020 CLINICAL DATA:  Dizziness. EXAM: CHEST - 2 VIEW COMPARISON:  October 15, 2019 FINDINGS: Chronic scarring is seen within the lateral aspect of the left lung base. There is no evidence of acute infiltrate, pleural effusion or pneumothorax. The heart size and mediastinal contours are within normal limits. A chronic deformity is seen involving the proximal left humerus. Degenerative changes seen throughout the thoracic spine. IMPRESSION: Chronic left basilar scarring without acute or active cardiopulmonary disease. Electronically Signed   By: Virgina Norfolk  M.D.   On: 09/28/2020 23:52   DG Thoracic Spine 2 View  Result Date: 09/28/2020 CLINICAL DATA:  Status post fall. EXAM: THORACIC SPINE 2 VIEWS COMPARISON:  None. FINDINGS: There is no evidence of an acute thoracic spine fracture. There is mild levoscoliosis of the lower thoracic spine. Mild-to-moderate severity multilevel endplate sclerosis is seen with multilevel intervertebral disc space narrowing. No other significant bone abnormalities are identified. IMPRESSION: 1. Mild-to-moderate severity multilevel degenerative changes without an acute osseous abnormality. Electronically Signed   By: Virgina Norfolk M.D.   On: 09/28/2020 23:53   DG Lumbar Spine Complete  Result Date: 09/28/2020 CLINICAL DATA:  Status post fall. EXAM: LUMBAR SPINE - COMPLETE 4+ VIEW COMPARISON:  None. FINDINGS: There is no evidence of an acute lumbar spine fracture. There is marked severity dextroscoliosis of the lumbar spine. There is moderate to marked severity multilevel endplate sclerosis with moderate to marked severity multilevel intervertebral disc space narrowing. Multiple radiopaque surgical clips and surgical sutures are seen within the pelvis. IMPRESSION: 1. Marked severity dextroscoliosis of the lumbar spine with moderate to marked severity multilevel degenerative disc disease. Electronically Signed   By: Virgina Norfolk M.D.   On: 09/28/2020 23:54   CT Head Wo Contrast  Result Date: 09/29/2020 CLINICAL DATA:  Fall EXAM: CT HEAD WITHOUT CONTRAST CT CERVICAL SPINE WITHOUT CONTRAST TECHNIQUE: Multidetector CT imaging of the head and cervical spine was performed following the standard protocol without intravenous contrast. Multiplanar CT image reconstructions of the cervical spine were also generated. COMPARISON:  CT head 07/22/2019, 07/17/2019, CT head and cervical spine 07/16/2019 FINDINGS: CT HEAD FINDINGS Brain: No evidence of acute infarction, hemorrhage, hydrocephalus, extra-axial collection, visible mass  lesion or mass effect. Symmetric prominence of the ventricles, cisterns and sulci compatible with parenchymal volume loss. Patchy areas of white matter hypoattenuation are most compatible with chronic microvascular angiopathy. Vascular: Atherosclerotic calcification of the carotid siphons and intradural vertebral arteries. No hyperdense vessel. Skull: No calvarial fracture or suspicious osseous lesion. No scalp swelling or hematoma. Sinuses/Orbits: Pneumatized secretions in the right maxillary and sphenoid sinuses. Additional mild pansinus mural disease and small bilateral mastoid effusions. Orbital structures are unremarkable aside from prior lens extractions. Other: None. CT CERVICAL SPINE FINDINGS Alignment: Stabilization collar is absent at the time of examination. There is mild rightward lateral cervical flexion and slight rightward cranial rotation. Unchanged degenerative anterolisthesis C3-C5 retrolisthesis C5 on 6. No evidence of traumatic listhesis. No abnormally widened, perched or jumped facets. Normal alignment of the craniocervical articulations. Arthrosis and narrowing of the atlantodental interval but with otherwise normal alignment of the atlantoaxial articulations. Skull base and vertebrae: No acute skull base fracture. No vertebral body fracture or height loss. Normal bone mineralization. No worrisome osseous lesions. Moderate arthrosis at the atlantodental and basion dens interval with some callus formation. Appearance is similar to prior. Multilevel cervical spondylitic changes are  described below. Soft tissues and spinal canal: No pre or paravertebral fluid or swelling. No visible canal hematoma. Cervical carotid atherosclerosis at the level of the bifurcations. Airways are patent. Disc levels: Multilevel cervical spondylitic changes are present, maximal C5-C7. Slightly larger disc osteophyte complexes present C4-5 and C6-7 partially efface the ventral thecal sac with spurring, retrolisthesis  and some ligamentum flavum infolding at C5-6 resulting in mild canal stenosis. Multilevel uncinate spurring facet hypertrophic changes are present resulting in mild-to-moderate multilevel neural foraminal narrowing with more moderate to severe narrowing bilaterally C5-6 as well. Upper chest: No acute abnormality in the upper chest or imaged lung apices. Other: Normal thyroid. IMPRESSION: 1. No acute intracranial abnormality. No acute scalp swelling or calvarial fracture. 2. Stable parenchymal volume loss and chronic microvascular ischemic white matter disease. 3. Pneumatized secretions in the right maxillary and sphenoid sinuses. Additional mild pansinus mural disease and small bilateral mastoid effusions. Correlate for features of acute sinusitis. 4. No acute fracture or traumatic listhesis of the cervical spine. 5. Multilevel cervical spondylitic and facet degenerative changes, maximal C5-C7, as above. 6. Cervical and intracranial atherosclerosis. Electronically Signed   By: Lovena Le M.D.   On: 09/29/2020 00:20   CT Cervical Spine Wo Contrast  Result Date: 09/29/2020 CLINICAL DATA:  Fall EXAM: CT HEAD WITHOUT CONTRAST CT CERVICAL SPINE WITHOUT CONTRAST TECHNIQUE: Multidetector CT imaging of the head and cervical spine was performed following the standard protocol without intravenous contrast. Multiplanar CT image reconstructions of the cervical spine were also generated. COMPARISON:  CT head 07/22/2019, 07/17/2019, CT head and cervical spine 07/16/2019 FINDINGS: CT HEAD FINDINGS Brain: No evidence of acute infarction, hemorrhage, hydrocephalus, extra-axial collection, visible mass lesion or mass effect. Symmetric prominence of the ventricles, cisterns and sulci compatible with parenchymal volume loss. Patchy areas of white matter hypoattenuation are most compatible with chronic microvascular angiopathy. Vascular: Atherosclerotic calcification of the carotid siphons and intradural vertebral arteries. No  hyperdense vessel. Skull: No calvarial fracture or suspicious osseous lesion. No scalp swelling or hematoma. Sinuses/Orbits: Pneumatized secretions in the right maxillary and sphenoid sinuses. Additional mild pansinus mural disease and small bilateral mastoid effusions. Orbital structures are unremarkable aside from prior lens extractions. Other: None. CT CERVICAL SPINE FINDINGS Alignment: Stabilization collar is absent at the time of examination. There is mild rightward lateral cervical flexion and slight rightward cranial rotation. Unchanged degenerative anterolisthesis C3-C5 retrolisthesis C5 on 6. No evidence of traumatic listhesis. No abnormally widened, perched or jumped facets. Normal alignment of the craniocervical articulations. Arthrosis and narrowing of the atlantodental interval but with otherwise normal alignment of the atlantoaxial articulations. Skull base and vertebrae: No acute skull base fracture. No vertebral body fracture or height loss. Normal bone mineralization. No worrisome osseous lesions. Moderate arthrosis at the atlantodental and basion dens interval with some callus formation. Appearance is similar to prior. Multilevel cervical spondylitic changes are described below. Soft tissues and spinal canal: No pre or paravertebral fluid or swelling. No visible canal hematoma. Cervical carotid atherosclerosis at the level of the bifurcations. Airways are patent. Disc levels: Multilevel cervical spondylitic changes are present, maximal C5-C7. Slightly larger disc osteophyte complexes present C4-5 and C6-7 partially efface the ventral thecal sac with spurring, retrolisthesis and some ligamentum flavum infolding at C5-6 resulting in mild canal stenosis. Multilevel uncinate spurring facet hypertrophic changes are present resulting in mild-to-moderate multilevel neural foraminal narrowing with more moderate to severe narrowing bilaterally C5-6 as well. Upper chest: No acute abnormality in the upper  chest or imaged lung apices.  Other: Normal thyroid. IMPRESSION: 1. No acute intracranial abnormality. No acute scalp swelling or calvarial fracture. 2. Stable parenchymal volume loss and chronic microvascular ischemic white matter disease. 3. Pneumatized secretions in the right maxillary and sphenoid sinuses. Additional mild pansinus mural disease and small bilateral mastoid effusions. Correlate for features of acute sinusitis. 4. No acute fracture or traumatic listhesis of the cervical spine. 5. Multilevel cervical spondylitic and facet degenerative changes, maximal C5-C7, as above. 6. Cervical and intracranial atherosclerosis. Electronically Signed   By: Lovena Le M.D.   On: 09/29/2020 00:20   CT ABDOMEN PELVIS W CONTRAST  Result Date: 09/29/2020 CLINICAL DATA:  Status post fall and dizziness. EXAM: CT ABDOMEN AND PELVIS WITH CONTRAST TECHNIQUE: Multidetector CT imaging of the abdomen and pelvis was performed using the standard protocol following bolus administration of intravenous contrast. CONTRAST:  133mL OMNIPAQUE IOHEXOL 300 MG/ML  SOLN COMPARISON:  None. FINDINGS: Lower chest: No acute abnormality. Hepatobiliary: No focal liver abnormality is seen. No gallstones, gallbladder wall thickening, or biliary dilatation. Pancreas: Unremarkable. No pancreatic ductal dilatation or surrounding inflammatory changes. Spleen: Normal in size without focal abnormality. Adrenals/Urinary Tract: Adrenal glands are unremarkable. Kidneys are normal, without renal calculi, focal lesion, or hydronephrosis. Bladder is unremarkable. Stomach/Bowel: There is a small hiatal hernia. The appendix is surgically absent. No evidence of bowel wall thickening, distention, or inflammatory changes. Noninflamed diverticula are seen throughout the large bowel. Vascular/Lymphatic: Moderate severity aortic calcification and tortuosity. No enlarged abdominal or pelvic lymph nodes. Reproductive: Status post hysterectomy. No adnexal masses.  Other: No abdominal wall hernia or abnormality. Multiple surgical clips are seen within the mid and lower left abdomen and posterior aspect of the mid to lower pelvis. No abdominopelvic ascites. Musculoskeletal: There is marked severity dextroscoliosis of the lumbar spine with marked severity multilevel degenerative changes. IMPRESSION: 1. Small hiatal hernia. 2. Colonic diverticulosis. 3. Marked severity dextroscoliosis of the lumbar spine with marked severity multilevel degenerative changes. 4. Aortic atherosclerosis. Aortic Atherosclerosis (ICD10-I70.0). Electronically Signed   By: Virgina Norfolk M.D.   On: 09/29/2020 01:34     Assessment and Recommendation  79 y.o. female with known diastolic dysfunction congestive heart failure coronary artery atherosclerosis anemia and paroxysmal nonvalvular atrial fibrillation having a urinary tract infection weakness and falling with improvements at this time.  Telemetry has shown evidence of sick sinus syndrome including long RR intervals and rapid ventricular rate and rare episodes of 5 beat runs of wide-complex tachycardia requiring better medication management as well as adjustments including the possibility of pacemaker placement  1.  Continue to monitor for atrial fibrillation sinus pauses or any other rhythm disturbances that could cause episodes of syncope or weakness.  We will continue metoprolol at low dose but will add amiodarone later on today.  Plan for of Micra pacemaker placement due to sick sinus syndrome and better management of current rehospitalization's.  Patient understands the risk and benefits of Micra pacemaker placement.  This includes the possibility of death stroke infection bleeding blood clot hemopericardium.  Patient is at low risk for conscious sedation.  The patient will have scheduled Micra leadless pacemaker for   Wednesday 2.  Discontinuation of heparin today for risk reduction and bleeding complications with Micra pacemaker  placement.  Begin Eliquis tomorrow for further risk reduction in stroke with atrial fibrillation 3.  Have discontinued of statin therapy at this time due to concerns of weakness fatigue and possible side effects contributing to above 4.  Okay for continued hypertension control with amlodipine although  will consider the discontinuation if there is lower blood pressure 5.  No further cardiac diagnostics necessary at this time    Signed, Serafina Royals M.D. FACC

## 2020-10-05 NOTE — TOC Progression Note (Signed)
Transition of Care [facility]) - Progression Note    Patient Details  Name: Melinda Maxwell MRN: [id number] Date of Birth: 02-05-1941  [facility] CM/SW Contact  Chapman Fitch, RN Phone Number: 10/05/2020, 9:02 AM  Clinical Narrative:     Per [facility] portal Berkley Harvey has been approved for Aggie Moats number [id number]  Approval 12/29-1/3       Expected Discharge Plan and Services                                                 Social Determinants of Health (SDOH) Interventions    Readmission Risk Interventions No flowsheet data found.

## 2020-10-05 NOTE — Progress Notes (Signed)
PT Cancellation Note  Patient Details Name: Melinda Maxwell MRN: [id number] DOB: 1941/07/09   Cancelled Treatment:     Session held due to pt receiving pacemaker this am with groin access.   Jannet Askew 10/05/2020, 10:38 AM

## 2020-10-05 NOTE — Progress Notes (Signed)
ANTICOAGULATION CONSULT NOTE  Pharmacy Consult for Heparin drip  (previously on apixaban) Indication: atrial fibrillation  Allergies  Allergen Reactions  . Benzocaine Other (See Comments)    Unknown  . Lisinopril Cough  . Neomycin Other (See Comments)    On allergy testing  . Sulfamethoxazole-Trimethoprim Other (See Comments)    Unknown  . Sulfa Antibiotics Rash    Patient Measurements: Height: 5\' 1"  (154.9 cm) Weight: 56.1 kg (123 lb 10.9 oz) IBW/kg (Calculated) : 47.8 Heparin Dosing Weight:    Vital Signs: Temp: 98.5 F (36.9 C) (12/28 2311) Temp Source: Oral (12/28 2311) BP: 116/70 (12/28 2311) Pulse Rate: 105 (12/28 2311)  Labs: Recent Labs    10/03/20 1427 10/03/20 2145 10/04/20 0336 10/04/20 0914 10/04/20 1613 10/05/20 0226  HGB 10.1*  --  9.7*  --   --  9.2*  HCT 30.9*  --  29.0*  --   --  28.6*  PLT 264  --  227  --   --  226  APTT 26   < >  --  92* 62* 106*  HEPARINUNFRC 1.20*  --  1.10*  --   --  0.71*  CREATININE  --   --   --   --   --  1.00   < > = values in this interval not displayed.    Estimated Creatinine Clearance: 33.3 mL/min (by C-G formula based on SCr of 1 mg/dL).   Medical History: Past Medical History:  Diagnosis Date  . Arthritis   . Asthma without status asthmaticus    unspecified  . Breast cancer (HCC)   . Coronary artery disease   . Diverticulosis   . DVT (deep venous thrombosis) (HCC)   . Edema   . Gout   . Hyperlipidemia   . Hypertension   . MI, old   . Myocardial infarct (HCC) 2004  . Parkinson's disease (HCC)   . Sleep apnea      Assessment: 79 yo F to start Heparin drip for Atrial Fibrillation. Patient was on apixaban (last dose was 5 mg on 12/26 at 0810) now with possible plan for pacemaker. Hgb 10.1  Plt 264   APTT 26   Heparin level 1.20  12/27 2145 aPTT 24  Increase drip to 1050 units/hr 12/28 0914 aPTT 92  Therapeutic 12/28 1613 aPTT 62  Subtherapeutic, increase drip to 1150 units/hr 12/29 0226 aPTT  106, supratherapeutic, HL = 0.71   Goal of Therapy:  HDW: 57.1 kg Heparin level 0.3-0.7 units/ml aPTT 66-102 seconds Monitor platelets by anticoagulation protocol: Yes   Plan:  Decrease heparin drip to 1100 units/hr. APTT and HL appear to be starting to correlate, therefore will recheck aPTT and HL in 8 hours. CBC daily while on heparin drip. Will use aPTT to monitor until correlation with HL is confirmed.  1/30, PharmD, [facility] 10/05/2020 3:59 AM

## 2020-10-05 NOTE — Progress Notes (Signed)
PHARMACY CONSULT NOTE  Pharmacy Consult for Electrolyte Monitoring and Replacement   Recent Labs: Potassium (mmol/L)  Date Value  10/05/2020 4.6   Magnesium (mg/dL)  Date Value  67/59/1638 1.5 (L)   Calcium (mg/dL)  Date Value  46/65/9935 9.6   Albumin (g/dL)  Date Value  70/17/7939 3.5   Sodium (mmol/L)  Date Value  10/05/2020 136     Assessment: 79 y.o. female with PMH of CAD, diastolic CHF, paroxysmal A. fib on Xarelto, Parkinson's disease, chronic dizziness and physical deconditioning, HTN, hospitalized in October 2021 for 2 syncopal events and fall with subdural hematoma, seen  cardiology for nonsustained V. tach,  during her stay with plans for outpatient loop recorder which has not yet been arranged, who was brought into the emergency room after she had two brief syncopal events at home, resulting in a fall without apparent injury.   Goal of Therapy:  Electrolytes WNL  Plan:   2 grams IV magnesium sulfate x 1  Re-check electrolytes with am labs  Lowella Bandy ,PharmD Clinical Pharmacist 10/05/2020 7:23 AM

## 2020-10-05 NOTE — Care Management Important Message (Signed)
Important Message  Patient Details  Name: Melinda Maxwell MRN: [id number] Date of Birth: 06-Nov-1940   Medicare Important Message Given:  Yes     [name] 10/05/2020, 10:57 AM

## 2020-10-06 DIAGNOSIS — I10 Essential (primary) hypertension: Secondary | ICD-10-CM | POA: Diagnosis not present

## 2020-10-06 DIAGNOSIS — I495 Sick sinus syndrome: Secondary | ICD-10-CM | POA: Diagnosis not present

## 2020-10-06 DIAGNOSIS — I480 Paroxysmal atrial fibrillation: Secondary | ICD-10-CM | POA: Diagnosis not present

## 2020-10-06 DIAGNOSIS — R55 Syncope and collapse: Secondary | ICD-10-CM | POA: Diagnosis not present

## 2020-10-06 LAB — MAGNESIUM: Magnesium: 1.9 mg/dL (ref 1.7–2.4)

## 2020-10-06 LAB — BASIC METABOLIC PANEL
Anion gap: 8 (ref 5–15)
BUN: 25 mg/dL — ABNORMAL HIGH (ref 8–23)
CO2: 26 mmol/L (ref 22–32)
Calcium: 9.6 mg/dL (ref 8.9–10.3)
Chloride: 105 mmol/L (ref 98–111)
Creatinine, Ser: 0.98 mg/dL (ref 0.44–1.00)
GFR, Estimated: 58 mL/min — ABNORMAL LOW (ref >60)
Glucose, Bld: 96 mg/dL (ref 70–99)
Potassium: 4.7 mmol/L (ref 3.5–5.1)
Sodium: 139 mmol/L (ref 135–145)

## 2020-10-06 LAB — GLUCOSE, CAPILLARY: Glucose-Capillary: 84 mg/dL (ref 70–99)

## 2020-10-06 LAB — RESP PANEL BY RT-PCR (FLU A&B, COVID) ARPGX2
Influenza A by PCR: NEGATIVE
Influenza B by PCR: NEGATIVE
SARS Coronavirus 2 by RT PCR: NEGATIVE

## 2020-10-06 MED ORDER — METOPROLOL TARTRATE 50 MG PO TABS
50.0000 mg | ORAL_TABLET | Freq: Two times a day (BID) | ORAL | Status: DC
  Administered 2020-10-06: 12:00:00 50 mg via ORAL
  Filled 2020-10-06: qty 1

## 2020-10-06 MED ORDER — MAGNESIUM OXIDE 400 (241.3 MG) MG PO TABS: 400.0000 mg | ORAL_TABLET | Freq: Every day | ORAL | 1 refills | Status: DC

## 2020-10-06 MED ORDER — ADULT MULTIVITAMIN W/MINERALS CH: 1.0000 | ORAL_TABLET | Freq: Every day | ORAL | 0 refills | Status: AC

## 2020-10-06 MED ORDER — APIXABAN 2.5 MG PO TABS: 2.5000 mg | ORAL_TABLET | Freq: Two times a day (BID) | ORAL | 1 refills | Status: AC

## 2020-10-06 MED ORDER — SENNOSIDES-DOCUSATE SODIUM 8.6-50 MG PO TABS: 2.0000 | ORAL_TABLET | Freq: Two times a day (BID) | ORAL | 0 refills | Status: AC

## 2020-10-06 MED ORDER — METOPROLOL TARTRATE 50 MG PO TABS: 50.0000 mg | ORAL_TABLET | Freq: Two times a day (BID) | ORAL | 1 refills | Status: AC

## 2020-10-06 MED ORDER — AMIODARONE HCL 200 MG PO TABS: 200.0000 mg | ORAL_TABLET | Freq: Two times a day (BID) | ORAL | 0 refills | Status: DC

## 2020-10-06 MED ORDER — ENSURE ENLIVE PO LIQD: 237.0000 mL | Freq: Two times a day (BID) | ORAL | 12 refills | Status: AC

## 2020-10-06 NOTE — TOC Transition Note (Addendum)
Transition of Care [facility]) - CM/SW Discharge Note   Patient Details  Name: Melinda Maxwell MRN: [id number] Date of Birth: 1940-12-01  [facility] Care [facility]) CM/SW Contact:  Chapman Fitch, RN Phone Number: 10/06/2020, 9:10 AM   Clinical Narrative:     Patient to discharge to [facility] today DC summary sent in hub repeat Covid negative Bedside RN to call report Voicemail left for Daughter to notify  EMS transport arranged for 2 pm   Final next level of care: Skilled Nursing Facility Barriers to Discharge: No Barriers Identified   Patient Goals and CMS Choice        Discharge Placement              Patient chooses bed at: [facility], [facility] Patient to be transferred to facility by: EMS Name of family member notified: daughter Patient and family notified of of transfer: 10/06/20  Discharge Plan and Services                                     Social Determinants of Health (SDOH) Interventions     Readmission Risk Interventions No flowsheet data found.

## 2020-10-06 NOTE — Progress Notes (Signed)
PHARMACY CONSULT NOTE  Pharmacy Consult for Electrolyte Monitoring and Replacement   Recent Labs: Potassium (mmol/L)  Date Value  10/06/2020 4.7   Magnesium (mg/dL)  Date Value  24/26/8341 1.9   Calcium (mg/dL)  Date Value  96/22/2979 9.6   Albumin (g/dL)  Date Value  89/21/1941 3.5   Sodium (mmol/L)  Date Value  10/06/2020 139    Assessment: 79 y.o. female with PMH of CAD, diastolic CHF, paroxysmal A. fib on Xarelto, Parkinson's disease, chronic dizziness and physical deconditioning, HTN, hospitalized in October 2021 for 2 syncopal events and fall with subdural hematoma, seen  cardiology for nonsustained V. tach,  during her stay with plans for outpatient loop recorder which has not yet been arranged, who was brought into the emergency room after she had two brief syncopal events at home, resulting in a fall without apparent injury.   Goal of Therapy:  Electrolytes WNL  Plan:   No electrolyte replacement warranted  Re-check electrolytes with am labs  Lowella Bandy ,PharmD Clinical Pharmacist 10/06/2020 7:51 AM

## 2020-10-06 NOTE — Discharge Summary (Signed)
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 Arthritis   . Asthma without status asthmaticus    unspecified  . Breast cancer (HCC)   . Coronary artery disease   . Diverticulosis   . DVT (deep venous thrombosis) (HCC)   . Edema   . Gout   . Hyperlipidemia   . Hypertension   . MI, old   . Myocardial infarct (HCC) 2004  . Parkinson's disease (HCC)   . Sleep apnea     HOSPITAL COURSE:  Melinda Maxwell a 79 y.o.femalewith medical history significant forCAD, diastolic CHF, paroxysmal A. fib on Xarelto, Parkinson's disease, chronic dizziness and physical deconditioning, HTN,who was brought to the hospital after 2 episodes of syncope at homewithfall  Syncope. Sick sinus syndrome -Patient being followed by cardiology Dr Billey Chang pacemaker placement by dr Cassie Freer [facility] leadless pacemaker 12/29) - Ok to resume Eliquis from today per Dr Gwen Pounds - cont BB, Amiodarone -d/ced CCB per cards rec -f/u Dr Gwen Pounds in 1 week  UTI -Antibiotics completed.  Paroxysmal atrial fibrillation Chronic diastolic congestive heart failure -Patient does not have any volume overload. -cont BB , Amiodarone. HR 60's    HTN -cont BB  Parkinson disease H/o falls -Continue Sinemet -PT consulted for deconditioning and falls--recommends rehab - Pt uses walker at  home.  Hypomagnesimia -mag 1.5--pharmacy to replace it -will place her on oral mag  Patient is a high 12 month mortality risk factor. Placed consult for outpatient palliative care evaluation to discuss GOC with pt and family  DVT prophylaxis: eliquis Code Status:Full Family Communication:dter susan  Disposition Plan:   Status is: Inpatient   Dispo: The patient is from:Home Anticipated d/c is to:SNF--pt's dter accepted bed at [facility] Anticipated d/c date is: today Patient currently is best at baseline. Okay from cardiology standpoint for discharge.    CONSULTS OBTAINED:  Treatment Team:  Marcina Millard, MD  DRUG ALLERGIES:   Allergies  Allergen Reactions  . Benzocaine Other (See Comments)    Unknown  . Lisinopril Cough  . Neomycin Other (See Comments)    On allergy testing  . Sulfamethoxazole-Trimethoprim Other (See Comments)    Unknown  . Sulfa Antibiotics Rash    DISCHARGE MEDICATIONS:   Allergies as of 10/06/2020      Reactions   Benzocaine Other (See Comments)   Unknown   Lisinopril Cough   Neomycin Other (See Comments)   On allergy testing   Sulfamethoxazole-trimethoprim Other (See Comments)   Unknown   Sulfa Antibiotics Rash      Medication List    STOP taking these medications   diltiazem 180 MG 24 hr capsule Commonly known as: CARDIZEM CD   hydrALAZINE 10 MG tablet Commonly known as: APRESOLINE     TAKE these medications   allopurinol 300 MG tablet Commonly known as: ZYLOPRIM Take 300 mg by mouth daily.   amiodarone 200 MG tablet Commonly known as: PACERONE Take 1  tablet (200 mg total) by mouth 2 (two) times daily.   apixaban 2.5 MG Tabs tablet Commonly known as: ELIQUIS Take 1 tablet (2.5 mg total) by mouth 2 (two) times daily. What changed:  medication strength how much to take   aspirin 81 MG chewable tablet Chew 1 tablet (81 mg total) by mouth daily.    carbidopa-levodopa 25-100 MG tablet Commonly known as: SINEMET IR Take 1-2 tablets by mouth 4 (four) times daily. Take 2 tablets at breakfast, lunch and dinner Take 1  tablet at bedtime   colchicine 0.6 MG tablet Take by mouth.   donepezil 10 MG tablet Commonly known as: ARICEPT Take 10 mg by mouth at bedtime.   feeding supplement Liqd Take 237 mLs by mouth 2 (two) times daily between meals.   magnesium oxide 400 (241.3 Mg) MG tablet Commonly known as: MAG-OX Take 1 tablet (400 mg total) by mouth daily.   metoprolol tartrate 50 MG tablet Commonly known as: LOPRESSOR Take 1 tablet (50 mg total) by mouth 2 (two) times daily.   mometasone 0.1 % lotion Commonly known as: ELOCON Apply 1 application topically at bedtime as needed.   multivitamin with minerals Tabs tablet Take 1 tablet by mouth daily.   nitroGLYCERIN 0.4 MG SL tablet Commonly known as: NITROSTAT Place 0.4 mg under the tongue every 5 (five) minutes as needed for chest pain.   pantoprazole 40 MG tablet Commonly known as: PROTONIX Take 40 mg by mouth daily.   senna-docusate 8.6-50 MG tablet Commonly known as: Senokot-S Take 2 tablets by mouth 2 (two) times daily.   simvastatin 10 MG tablet Commonly known as: ZOCOR Take 10 mg by mouth daily. HLD- reduced d/t interaction with Diltiazem- increased r/f Rhabdomyelosis   Vitamin D3 50 MCG (2000 UT) capsule Take by mouth.            Discharge Care Instructions  (From admission, onward)         Start     Ordered   10/06/20 0000  Discharge wound care:       Comments: Leave current dressing on. Shower after 2-3 days   10/06/20 0857          If you experience worsening of your admission symptoms, develop shortness of breath, life threatening emergency, suicidal or homicidal thoughts you must seek medical attention immediately by calling 911 or calling your MD immediately  if symptoms less severe.  You Must read complete instructions/literature along  with all the possible adverse reactions/side effects for all the Medicines you take and that have been prescribed to you. Take any new Medicines after you have completely understood and accept all the possible adverse reactions/side effects.   Please note  You were cared for by a hospitalist during your hospital stay. If you have any questions about your discharge medications or the care you received while you were in the hospital after you are discharged, you can call the unit and asked to speak with the hospitalist on call if the hospitalist that took care of you is not available. Once you are discharged, your primary care physician will handle any further medical issues. Please note that NO REFILLS for any discharge medications will be authorized once you are discharged, as it is imperative that you return to your primary care physician (or establish a relationship with a primary care physician if you do not have one) for your aftercare needs so that they can reassess your need for medications and monitor your lab values.  Today   SUBJECTIVE   Doing well Eating breakfast  VITAL SIGNS:  Blood pressure 135/69, pulse (!) 107, temperature (!) 97.5 F (36.4 C), temperature source Oral, resp. rate 18, height 5\' 1"  (1.549 m), weight 56 kg, SpO2 99 %.  I/O:    Intake/Output Summary (Last 24 hours) at 10/06/2020 0902 Last data filed at 10/06/2020 0602 Gross per 24 hour  Intake --  Output 500 ml  Net -500 ml    PHYSICAL EXAMINATION:  GENERAL:  79 y.o.-year-old patient lying in the bed with no acute distress.  LUNGS: Normal breath sounds bilaterally, no wheezing, rales,rhonchi or crepitation. No use of accessory muscles of respiration.  CARDIOVASCULAR: S1, S2 normal. No murmurs, rubs, or gallops.  ABDOMEN: Soft, non-tender, non-distended. Bowel sounds present. No organomegaly or mass.  EXTREMITIES: No pedal edema, cyanosis, or clubbing. Right groin bandage+ NEUROLOGIC: Cranial nerves II  through XII are intact. Muscle strength 5/5 in all extremities. Sensation intact. Gait not checked.  PSYCHIATRIC: The patient is alert and oriented x 2.  SKIN: No obvious rash, lesion, or ulcer.   DATA REVIEW:   CBC  Recent Labs  Lab 10/05/20 0226  WBC 7.6  HGB 9.2*  HCT 28.6*  PLT 226    Chemistries  Recent Labs  Lab 10/06/20 0506  NA 139  K 4.7  CL 105  CO2 26  GLUCOSE 96  BUN 25*  CREATININE 0.98  CALCIUM 9.6  MG 1.9    Microbiology Results   Recent Results (from the past 240 hour(s))  Resp Panel by RT-PCR (Flu A&B, Covid) Nasopharyngeal Swab     Status: None   Collection Time: 09/29/20 12:22 AM   Specimen: Nasopharyngeal Swab; Nasopharyngeal(NP) swabs in vial transport medium  Result Value Ref Range Status   SARS Coronavirus 2 by RT PCR NEGATIVE NEGATIVE Final    Comment: (NOTE) SARS-CoV-2 target nucleic acids are NOT DETECTED.  The SARS-CoV-2 RNA is generally detectable in upper respiratory specimens during the acute phase of infection. The lowest concentration of SARS-CoV-2 viral copies this assay can detect is 138 copies/mL. A negative result does not preclude SARS-Cov-2 infection and should not be used as the sole basis for treatment or other patient management decisions. A negative result may occur with  improper specimen collection/handling, submission of specimen other than nasopharyngeal swab, presence of viral mutation(s) within the areas targeted by this assay, and inadequate number of viral copies(<138 copies/mL). A negative result must be combined with clinical observations, patient history, and epidemiological information. The expected result is Negative.  Fact Sheet for Patients:  EntrepreneurPulse.com.au  Fact Sheet for Healthcare Providers:  IncredibleEmployment.be  This test is no t yet approved or cleared by the [place] FDA and  has been authorized for detection and/or diagnosis of SARS-CoV-2  by FDA under an Emergency Use Authorization (EUA). This EUA will remain  in effect (meaning this test can be used) for the duration of the COVID-19 declaration under Section 564(b)(1) of the Act, 21 U.S.C.section 360bbb-3(b)(1), unless the authorization is terminated  or revoked sooner.       Influenza A by PCR NEGATIVE NEGATIVE Final   Influenza B by PCR NEGATIVE NEGATIVE Final    Comment: (NOTE) The Xpert Xpress SARS-CoV-2/FLU/RSV plus assay is intended as an aid in the diagnosis of influenza from Nasopharyngeal swab specimens and should not be used as a sole basis for treatment. Nasal washings and aspirates are unacceptable for Xpert Xpress SARS-CoV-2/FLU/RSV testing.  Fact Sheet for Patients: EntrepreneurPulse.com.au  Fact  Sheet for Healthcare Providers: IncredibleEmployment.be  This test is not yet approved or cleared by the [place] and has been authorized for detection and/or diagnosis of SARS-CoV-2 by FDA under an Emergency Use Authorization (EUA). This EUA will remain in effect (meaning this test can be used) for the duration of the COVID-19 declaration under Section 564(b)(1) of the Act, 21 U.S.C. section 360bbb-3(b)(1), unless the authorization is terminated or revoked.  Performed at [facility], [address] [place], [place] [address]     RADIOLOGY:  EP PPM/ICD IMPLANT  Result Date: 10/05/2020 Successful Micra AV leadless pacemaker implantation    CODE STATUS:     Code Status Orders  (From admission, onward)         Start     Ordered   09/29/20 0301  Full code  Continuous        09/29/20 0303        Code Status History    Date Active Date Inactive Code Status Order ID Comments User Context   07/16/2019 0608 07/25/2019 1905 Full Code [id number]  Vianne Bulls, MD ED   02/18/2018 1558 02/26/2018 2110 Full Code [id number]  Nicholes Mango, MD Inpatient   02/11/2018 1005 02/12/2018 1833 Full Code  [id number]  End, Harrell Gave, MD Inpatient   Advance Care Planning Activity    Advance Directive Documentation   Flowsheet Row Most Recent Value  Type of Advance Directive Healthcare Power of Attorney  Pre-existing out of facility DNR order (yellow form or pink MOST form) --  "MOST" Form in Place? --       TOTAL TIME TAKING CARE OF THIS PATIENT: *35* minutes.    Fritzi Mandes M.D  [facility]    CC: Primary care physician; [name], MD

## 2020-10-06 NOTE — Progress Notes (Signed)
[facility] Cardiology [facility] Encounter Note  Patient: Melinda Maxwell / Admit Date: 09/28/2020 / Date of Encounter: 10/06/2020, 8:37 AM   Subjective: 12/24.  Patient has rested well overnight with no further evidence of significant anginal symptoms or congestive heart failure syncope or falls.  The patient did have atrial fibrillation with rapid ventricular rate on admission but now has spontaneously converted to normal sinus rhythm with preventricular contractions.  There is no evidence of advanced heart block or significant pauses by telemetry.  The patient has had chronic diastolic dysfunction heart failure but currently has no evidence of significant symptoms of that.  Troponin levels have been 24/24 consistent with demand ischemia rather than acute coronary syndrome.  The patient does have urinary tract infection which may have influenced her weakness fatigue and falls.  12/25.  Patient rested well overnight but has not been ambulating throughout the day to assess for any weakness and fatigue and or other concerns.  With Parkinson's she can have some hypotension dizziness weakness and falls.  Additionally she has not had any rhythm disturbances causing these falls based on recent evidence as an outpatient as well is currently here as an inpatient.  Current telemetry does show that she has normal sinus rhythm with frequent preventricular contractions and some short runs of atrial fibrillation.  She is slowly recovering from urinary tract infection  12/26.  Patient overall has felt fairly well since admission to the hospital with no evidence of further significant PND orthopnea syncope dizziness nausea or diaphoresis.  Patient has had episodes of atrial fibrillation with more rapid rate requiring metoprolol use as well as spontaneous conversion to normal sinus rhythm.  Additionally there is some longer RR intervals and longer pauses at least up to 2.6 seconds.  This is most consistent with sick  sinus syndrome which may be contributing to her current issues of weakness fatigue and syncope.  We will need further discussion of the possibility of pacemaker placement including pacemaker with a Micra.  Will consider this a possibility after further adjustments of medication management and discontinuation of anticoagulation for further risk reduction in bleeding complications with procedure.  12/27.  Patient has had an episode of dizziness and weakness while sitting in the bed most consistent with her previous spells that she has had before.  The spells have caused her to have falls as well.  In review of her telemetry it appears that she had a 5 beat run of wide-complex tachycardia which has been seen in the past by Holter monitor.  Additionally previously we have seen supraventricular tachycardia very fast heart rate of atrial fibrillation and slow heart rates with long RR intervals.  All of these could be causing her episodes.  This increases her risks of falls and concerns.  With increased medical management of these the patient will have worsening symptoms of bradycardia and long RR intervals.  We have suggested increasing beta-blocker and potentially using amiodarone at low dose for maintenance of normal sinus rhythm and reduction of significant symptoms and issues related to above.  After secondary consultation and discussion with Dr. Darrold Junker there is agreement for the possibility of Micra pacemaker as backup with better management.  We have discussed at length with the family and the patient about Micra pacemaker backup as next treatment.  She understands all the risk and benefits at this and agrees.  12/28.  Patient feels much better today.  No evidence of the episodes of irregular heartbeat that she could not confirm.  The patient has had a long discussion possible medical management yesterday as mapped out above.  She continues to understand and wishes to proceed with current treatment  options  12/29.  Patient has had no evidence of issues or concerns overnight.  Still slightly dizzy on occasion.  Patient has had atrial fibrillation with variable heart rate.  Plan for Micra pacemaker today  12/30.  Patient has tolerated her Micra pacemaker placement without complication at this time.  Right lower femoral groin area appears stable without evidence of significant bleeding complication.  Patient's telemetry shows atrial fibrillation with controlled ventricular rate.  Addition of amiodarone has begun for potential spontaneous conversion to normal sinus rhythm and metoprolol can be increased to higher dose of 50 mg.  This should allow for patient to have potential spontaneous conversion to normal sinus rhythm.  Will discontinue amlodipine in case patient has dizziness side effects of this medication causing her to fall.  She will need some ambulation and rehabilitation  Review of Systems: Positive for: None Negative for: Vision change, hearing change, syncope, dizziness, nausea, vomiting,diarrhea, bloody stool, stomach pain, cough, congestion, diaphoresis, urinary frequency, urinary pain,skin lesions, skin rashes Others previously listed  Objective: Telemetry: Normal sinus rhythm with preventricular contractions Physical Exam: Blood pressure 135/69, pulse (!) 107, temperature (!) 97.5 F (36.4 C), temperature source Oral, resp. rate 18, height 5\' 1"  (1.549 m), weight 56 kg, SpO2 99 %. Body mass index is 23.33 kg/m. General: Well developed, well nourished, in no acute distress. Head: Normocephalic, atraumatic, sclera non-icteric, no xanthomas, nares are without discharge. Neck: No apparent masses Lungs: Normal respirations with no wheezes, no rhonchi, no rales , no crackles   Heart: Irregular rate and rhythm, normal S1 S2, no murmur, no rub, no gallop, PMI is normal size and placement, carotid upstroke normal without bruit, jugular venous pressure normal Abdomen: Soft, non-tender,  non-distended with normoactive bowel sounds. No hepatosplenomegaly. Abdominal aorta is normal size without bruit Extremities: Trace edema, no clubbing, no cyanosis, no ulcers,  Peripheral: 2+ radial, 2+ femoral, 2+ dorsal pedal pulses Neuro: Alert and oriented. Moves all extremities spontaneously. Psych:  Responds to questions appropriately with a normal affect.   Intake/Output Summary (Last 24 hours) at 10/06/2020 0837 Last data filed at 10/06/2020 0602 Gross per 24 hour  Intake --  Output 500 ml  Net -500 ml    Inpatient Medications:   allopurinol  300 mg Oral Daily   amiodarone  200 mg Oral BID   amLODipine  5 mg Oral Daily   apixaban  2.5 mg Oral BID   aspirin  81 mg Oral Daily   carbidopa-levodopa  1 tablet Oral QHS   carbidopa-levodopa  2 tablet Oral TID with meals   donepezil  10 mg Oral QHS   feeding supplement  237 mL Oral BID BM   magnesium oxide  400 mg Oral Daily   metoprolol tartrate  25 mg Oral BID   multivitamin with minerals  1 tablet Oral Daily   nystatin   Topical TID   pantoprazole  40 mg Oral Daily   senna-docusate  2 tablet Oral BID   sodium chloride flush  3 mL Intravenous Q12H   sodium chloride flush  3 mL Intravenous Q12H   Infusions:   sodium chloride Stopped (09/29/20 2351)   sodium chloride      Labs: Recent Labs    10/05/20 0226 10/06/20 0506  NA 136 139  K 4.6 4.7  CL 101 105  CO2 27 26  GLUCOSE 129* 96  BUN 32* 25*  CREATININE 1.00 0.98  CALCIUM 9.6 9.6  MG 1.5* 1.9   No results for input(s): AST, ALT, ALKPHOS, BILITOT, PROT, ALBUMIN in the last 72 hours. Recent Labs    10/04/20 0336 10/05/20 0226  WBC 7.0 7.6  NEUTROABS  --  4.1  HGB 9.7* 9.2*  HCT 29.0* 28.6*  MCV 91.5 93.5  PLT 227 226   No results for input(s): CKTOTAL, CKMB, TROPONINI in the last 72 hours. Invalid input(s): POCBNP No results for input(s): HGBA1C in the last 72 hours.   Weights: Filed Weights   10/03/20 0500 10/04/20 0439  10/05/20 0453  Weight: 57.1 kg 56.1 kg 56 kg     Radiology/Studies:  DG Chest 2 View  Result Date: 09/28/2020 CLINICAL DATA:  Dizziness. EXAM: CHEST - 2 VIEW COMPARISON:  October 15, 2019 FINDINGS: Chronic scarring is seen within the lateral aspect of the left lung base. There is no evidence of acute infiltrate, pleural effusion or pneumothorax. The heart size and mediastinal contours are within normal limits. A chronic deformity is seen involving the proximal left humerus. Degenerative changes seen throughout the thoracic spine. IMPRESSION: Chronic left basilar scarring without acute or active cardiopulmonary disease. Electronically Signed   By: Virgina Norfolk M.D.   On: 09/28/2020 23:52   DG Thoracic Spine 2 View  Result Date: 09/28/2020 CLINICAL DATA:  Status post fall. EXAM: THORACIC SPINE 2 VIEWS COMPARISON:  None. FINDINGS: There is no evidence of an acute thoracic spine fracture. There is mild levoscoliosis of the lower thoracic spine. Mild-to-moderate severity multilevel endplate sclerosis is seen with multilevel intervertebral disc space narrowing. No other significant bone abnormalities are identified. IMPRESSION: 1. Mild-to-moderate severity multilevel degenerative changes without an acute osseous abnormality. Electronically Signed   By: Virgina Norfolk M.D.   On: 09/28/2020 23:53   DG Lumbar Spine Complete  Result Date: 09/28/2020 CLINICAL DATA:  Status post fall. EXAM: LUMBAR SPINE - COMPLETE 4+ VIEW COMPARISON:  None. FINDINGS: There is no evidence of an acute lumbar spine fracture. There is marked severity dextroscoliosis of the lumbar spine. There is moderate to marked severity multilevel endplate sclerosis with moderate to marked severity multilevel intervertebral disc space narrowing. Multiple radiopaque surgical clips and surgical sutures are seen within the pelvis. IMPRESSION: 1. Marked severity dextroscoliosis of the lumbar spine with moderate to marked severity multilevel  degenerative disc disease. Electronically Signed   By: Virgina Norfolk M.D.   On: 09/28/2020 23:54   CT Head Wo Contrast  Result Date: 09/29/2020 CLINICAL DATA:  Fall EXAM: CT HEAD WITHOUT CONTRAST CT CERVICAL SPINE WITHOUT CONTRAST TECHNIQUE: Multidetector CT imaging of the head and cervical spine was performed following the standard protocol without intravenous contrast. Multiplanar CT image reconstructions of the cervical spine were also generated. COMPARISON:  CT head 07/22/2019, 07/17/2019, CT head and cervical spine 07/16/2019 FINDINGS: CT HEAD FINDINGS Brain: No evidence of acute infarction, hemorrhage, hydrocephalus, extra-axial collection, visible mass lesion or mass effect. Symmetric prominence of the ventricles, cisterns and sulci compatible with parenchymal volume loss. Patchy areas of white matter hypoattenuation are most compatible with chronic microvascular angiopathy. Vascular: Atherosclerotic calcification of the carotid siphons and intradural vertebral arteries. No hyperdense vessel. Skull: No calvarial fracture or suspicious osseous lesion. No scalp swelling or hematoma. Sinuses/Orbits: Pneumatized secretions in the right maxillary and sphenoid sinuses. Additional mild pansinus mural disease and small bilateral mastoid effusions. Orbital structures are unremarkable aside from prior lens extractions. Other: None. CT CERVICAL SPINE FINDINGS  Alignment: Stabilization collar is absent at the time of examination. There is mild rightward lateral cervical flexion and slight rightward cranial rotation. Unchanged degenerative anterolisthesis C3-C5 retrolisthesis C5 on 6. No evidence of traumatic listhesis. No abnormally widened, perched or jumped facets. Normal alignment of the craniocervical articulations. Arthrosis and narrowing of the atlantodental interval but with otherwise normal alignment of the atlantoaxial articulations. Skull base and vertebrae: No acute skull base fracture. No vertebral  body fracture or height loss. Normal bone mineralization. No worrisome osseous lesions. Moderate arthrosis at the atlantodental and basion dens interval with some callus formation. Appearance is similar to prior. Multilevel cervical spondylitic changes are described below. Soft tissues and spinal canal: No pre or paravertebral fluid or swelling. No visible canal hematoma. Cervical carotid atherosclerosis at the level of the bifurcations. Airways are patent. Disc levels: Multilevel cervical spondylitic changes are present, maximal C5-C7. Slightly larger disc osteophyte complexes present C4-5 and C6-7 partially efface the ventral thecal sac with spurring, retrolisthesis and some ligamentum flavum infolding at C5-6 resulting in mild canal stenosis. Multilevel uncinate spurring facet hypertrophic changes are present resulting in mild-to-moderate multilevel neural foraminal narrowing with more moderate to severe narrowing bilaterally C5-6 as well. Upper chest: No acute abnormality in the upper chest or imaged lung apices. Other: Normal thyroid. IMPRESSION: 1. No acute intracranial abnormality. No acute scalp swelling or calvarial fracture. 2. Stable parenchymal volume loss and chronic microvascular ischemic white matter disease. 3. Pneumatized secretions in the right maxillary and sphenoid sinuses. Additional mild pansinus mural disease and small bilateral mastoid effusions. Correlate for features of acute sinusitis. 4. No acute fracture or traumatic listhesis of the cervical spine. 5. Multilevel cervical spondylitic and facet degenerative changes, maximal C5-C7, as above. 6. Cervical and intracranial atherosclerosis. Electronically Signed   By: Lovena Le M.D.   On: 09/29/2020 00:20   CT Cervical Spine Wo Contrast  Result Date: 09/29/2020 CLINICAL DATA:  Fall EXAM: CT HEAD WITHOUT CONTRAST CT CERVICAL SPINE WITHOUT CONTRAST TECHNIQUE: Multidetector CT imaging of the head and cervical spine was performed following  the standard protocol without intravenous contrast. Multiplanar CT image reconstructions of the cervical spine were also generated. COMPARISON:  CT head 07/22/2019, 07/17/2019, CT head and cervical spine 07/16/2019 FINDINGS: CT HEAD FINDINGS Brain: No evidence of acute infarction, hemorrhage, hydrocephalus, extra-axial collection, visible mass lesion or mass effect. Symmetric prominence of the ventricles, cisterns and sulci compatible with parenchymal volume loss. Patchy areas of white matter hypoattenuation are most compatible with chronic microvascular angiopathy. Vascular: Atherosclerotic calcification of the carotid siphons and intradural vertebral arteries. No hyperdense vessel. Skull: No calvarial fracture or suspicious osseous lesion. No scalp swelling or hematoma. Sinuses/Orbits: Pneumatized secretions in the right maxillary and sphenoid sinuses. Additional mild pansinus mural disease and small bilateral mastoid effusions. Orbital structures are unremarkable aside from prior lens extractions. Other: None. CT CERVICAL SPINE FINDINGS Alignment: Stabilization collar is absent at the time of examination. There is mild rightward lateral cervical flexion and slight rightward cranial rotation. Unchanged degenerative anterolisthesis C3-C5 retrolisthesis C5 on 6. No evidence of traumatic listhesis. No abnormally widened, perched or jumped facets. Normal alignment of the craniocervical articulations. Arthrosis and narrowing of the atlantodental interval but with otherwise normal alignment of the atlantoaxial articulations. Skull base and vertebrae: No acute skull base fracture. No vertebral body fracture or height loss. Normal bone mineralization. No worrisome osseous lesions. Moderate arthrosis at the atlantodental and basion dens interval with some callus formation. Appearance is similar to prior. Multilevel cervical spondylitic changes  are described below. Soft tissues and spinal canal: No pre or paravertebral  fluid or swelling. No visible canal hematoma. Cervical carotid atherosclerosis at the level of the bifurcations. Airways are patent. Disc levels: Multilevel cervical spondylitic changes are present, maximal C5-C7. Slightly larger disc osteophyte complexes present C4-5 and C6-7 partially efface the ventral thecal sac with spurring, retrolisthesis and some ligamentum flavum infolding at C5-6 resulting in mild canal stenosis. Multilevel uncinate spurring facet hypertrophic changes are present resulting in mild-to-moderate multilevel neural foraminal narrowing with more moderate to severe narrowing bilaterally C5-6 as well. Upper chest: No acute abnormality in the upper chest or imaged lung apices. Other: Normal thyroid. IMPRESSION: 1. No acute intracranial abnormality. No acute scalp swelling or calvarial fracture. 2. Stable parenchymal volume loss and chronic microvascular ischemic white matter disease. 3. Pneumatized secretions in the right maxillary and sphenoid sinuses. Additional mild pansinus mural disease and small bilateral mastoid effusions. Correlate for features of acute sinusitis. 4. No acute fracture or traumatic listhesis of the cervical spine. 5. Multilevel cervical spondylitic and facet degenerative changes, maximal C5-C7, as above. 6. Cervical and intracranial atherosclerosis. Electronically Signed   By: Kreg Shropshire M.D.   On: 09/29/2020 00:20   CT ABDOMEN PELVIS W CONTRAST  Result Date: 09/29/2020 CLINICAL DATA:  Status post fall and dizziness. EXAM: CT ABDOMEN AND PELVIS WITH CONTRAST TECHNIQUE: Multidetector CT imaging of the abdomen and pelvis was performed using the standard protocol following bolus administration of intravenous contrast. CONTRAST:  OMNIPAQUE IOHEXOL 300 MG/ML  SOLN COMPARISON:  None. FINDINGS: Lower chest: No acute abnormality. Hepatobiliary: No focal liver abnormality is seen. No gallstones, gallbladder wall thickening, or biliary dilatation. Pancreas: Unremarkable.  No pancreatic ductal dilatation or surrounding inflammatory changes. Spleen: Normal in size without focal abnormality. Adrenals/Urinary Tract: Adrenal glands are unremarkable. Kidneys are normal, without renal calculi, focal lesion, or hydronephrosis. Bladder is unremarkable. Stomach/Bowel: There is a small hiatal hernia. The appendix is surgically absent. No evidence of bowel wall thickening, distention, or inflammatory changes. Noninflamed diverticula are seen throughout the large bowel. Vascular/Lymphatic: Moderate severity aortic calcification and tortuosity. No enlarged abdominal or pelvic lymph nodes. Reproductive: Status post hysterectomy. No adnexal masses. Other: No abdominal wall hernia or abnormality. Multiple surgical clips are seen within the mid and lower left abdomen and posterior aspect of the mid to lower pelvis. No abdominopelvic ascites. Musculoskeletal: There is marked severity dextroscoliosis of the lumbar spine with marked severity multilevel degenerative changes. IMPRESSION: 1. Small hiatal hernia. 2. Colonic diverticulosis. 3. Marked severity dextroscoliosis of the lumbar spine with marked severity multilevel degenerative changes. 4. Aortic atherosclerosis. Aortic Atherosclerosis (ICD10-I70.0). Electronically Signed   By: Aram Candela M.D.   On: 09/29/2020 01:34   EP PPM/ICD IMPLANT  Result Date: 10/05/2020 Successful Micra AV leadless pacemaker implantation    Assessment and Recommendation  79 y.o. female with known diastolic dysfunction congestive heart failure coronary artery atherosclerosis anemia and paroxysmal nonvalvular atrial fibrillation having a urinary tract infection weakness and falling with improvements at this time.  Telemetry has shown evidence of sick sinus syndrome including long RR intervals and rapid ventricular rate and rare episodes of 5 beat runs of wide-complex tachycardia requiring better medication management as well as adjustments including the below   1.  Continue to monitor for atrial fibrillation sinus pauses or any other rhythm disturbances that could cause episodes of syncope or weakness.  Amiodarone 200 mg twice per day which can be continued through outpatient until seen in the office for which  when adjustments will be made.  Also will increase metoprolol for better control of atrial fibrillation rapid ventricular rate, as well as short runs of ventricular tachycardia that has been seen in the past.  Additionally the patient will ambulate and follow for adjustments of medication management 2.  Change over to Eliquis today from heparin for further risk reduction in stroke with atrial fibrillation 3.  Have discontinued of statin therapy at this time due to concerns of weakness fatigue and possible side effects contributing to above 4.  Discontinuation of amlodipine at this time due to concerns of increasing metoprolol and possible hypotension and side effects of amlodipine 5.  No further cardiac diagnostics necessary at this time 6.  Okay for discharge to home from cardiac standpoint with current recommendations as above.  We will plan to sign off at this time.  Please call if further questions  Signed, Serafina Royals M.D. FACC

## 2020-12-11 ENCOUNTER — Emergency Department: Payer: Medicare Other

## 2020-12-11 ENCOUNTER — Inpatient Hospital Stay
Admission: EM | Admit: 2020-12-11 | Discharge: 2020-12-16 | DRG: 291 | Disposition: A | Discharge status: Skilled Nursing Facility | Payer: Medicare Other | Attending: Internal Medicine | Admitting: Internal Medicine

## 2020-12-11 ENCOUNTER — Other Ambulatory Visit: Payer: Self-pay

## 2020-12-11 ENCOUNTER — Inpatient Hospital Stay: Payer: Medicare Other

## 2020-12-11 DIAGNOSIS — J810 Acute pulmonary edema: Secondary | ICD-10-CM | POA: Diagnosis present

## 2020-12-11 DIAGNOSIS — I959 Hypotension, unspecified: Secondary | ICD-10-CM | POA: Diagnosis present

## 2020-12-11 DIAGNOSIS — Z79899 Other long term (current) drug therapy: Secondary | ICD-10-CM

## 2020-12-11 DIAGNOSIS — Z20822 Contact with and (suspected) exposure to covid-19: Secondary | ICD-10-CM | POA: Diagnosis present

## 2020-12-11 DIAGNOSIS — Z853 Personal history of malignant neoplasm of breast: Secondary | ICD-10-CM | POA: Diagnosis not present

## 2020-12-11 DIAGNOSIS — G4730 Sleep apnea, unspecified: Secondary | ICD-10-CM | POA: Diagnosis present

## 2020-12-11 DIAGNOSIS — J4531 Mild persistent asthma with (acute) exacerbation: Secondary | ICD-10-CM | POA: Diagnosis present

## 2020-12-11 DIAGNOSIS — J9601 Acute respiratory failure with hypoxia: Secondary | ICD-10-CM | POA: Diagnosis present

## 2020-12-11 DIAGNOSIS — I130 Hypertensive heart and chronic kidney disease with heart failure and stage 1 through stage 4 chronic kidney disease, or unspecified chronic kidney disease: Principal | ICD-10-CM | POA: Diagnosis present

## 2020-12-11 DIAGNOSIS — R42 Dizziness and giddiness: Secondary | ICD-10-CM | POA: Diagnosis present

## 2020-12-11 DIAGNOSIS — I161 Hypertensive emergency: Secondary | ICD-10-CM | POA: Diagnosis present

## 2020-12-11 DIAGNOSIS — F0280 Dementia in other diseases classified elsewhere without behavioral disturbance: Secondary | ICD-10-CM | POA: Diagnosis present

## 2020-12-11 DIAGNOSIS — M109 Gout, unspecified: Secondary | ICD-10-CM | POA: Diagnosis present

## 2020-12-11 DIAGNOSIS — T502X5A Adverse effect of carbonic-anhydrase inhibitors, benzothiadiazides and other diuretics, initial encounter: Secondary | ICD-10-CM | POA: Diagnosis present

## 2020-12-11 DIAGNOSIS — I252 Old myocardial infarction: Secondary | ICD-10-CM

## 2020-12-11 DIAGNOSIS — R0602 Shortness of breath: Secondary | ICD-10-CM

## 2020-12-11 DIAGNOSIS — J9811 Atelectasis: Secondary | ICD-10-CM | POA: Diagnosis present

## 2020-12-11 DIAGNOSIS — G20 Parkinson's disease: Secondary | ICD-10-CM | POA: Diagnosis present

## 2020-12-11 DIAGNOSIS — Z23 Encounter for immunization: Secondary | ICD-10-CM | POA: Diagnosis not present

## 2020-12-11 DIAGNOSIS — N1831 Chronic kidney disease, stage 3a: Secondary | ICD-10-CM | POA: Diagnosis present

## 2020-12-11 DIAGNOSIS — I509 Heart failure, unspecified: Secondary | ICD-10-CM | POA: Diagnosis not present

## 2020-12-11 DIAGNOSIS — Z8249 Family history of ischemic heart disease and other diseases of the circulatory system: Secondary | ICD-10-CM

## 2020-12-11 DIAGNOSIS — Z7901 Long term (current) use of anticoagulants: Secondary | ICD-10-CM

## 2020-12-11 DIAGNOSIS — E785 Hyperlipidemia, unspecified: Secondary | ICD-10-CM | POA: Diagnosis present

## 2020-12-11 DIAGNOSIS — Z66 Do not resuscitate: Secondary | ICD-10-CM | POA: Diagnosis present

## 2020-12-11 DIAGNOSIS — I5033 Acute on chronic diastolic (congestive) heart failure: Secondary | ICD-10-CM | POA: Diagnosis present

## 2020-12-11 DIAGNOSIS — G20A1 Parkinson's disease without dyskinesia, without mention of fluctuations: Secondary | ICD-10-CM | POA: Diagnosis present

## 2020-12-11 DIAGNOSIS — I248 Other forms of acute ischemic heart disease: Secondary | ICD-10-CM | POA: Diagnosis present

## 2020-12-11 DIAGNOSIS — Z7982 Long term (current) use of aspirin: Secondary | ICD-10-CM

## 2020-12-11 DIAGNOSIS — Z86718 Personal history of other venous thrombosis and embolism: Secondary | ICD-10-CM

## 2020-12-11 DIAGNOSIS — I480 Paroxysmal atrial fibrillation: Secondary | ICD-10-CM | POA: Diagnosis present

## 2020-12-11 DIAGNOSIS — Z888 Allergy status to other drugs, medicaments and biological substances status: Secondary | ICD-10-CM

## 2020-12-11 DIAGNOSIS — R0902 Hypoxemia: Secondary | ICD-10-CM

## 2020-12-11 DIAGNOSIS — Z882 Allergy status to sulfonamides status: Secondary | ICD-10-CM

## 2020-12-11 DIAGNOSIS — Z955 Presence of coronary angioplasty implant and graft: Secondary | ICD-10-CM

## 2020-12-11 DIAGNOSIS — I2510 Atherosclerotic heart disease of native coronary artery without angina pectoris: Secondary | ICD-10-CM | POA: Diagnosis present

## 2020-12-11 DIAGNOSIS — K219 Gastro-esophageal reflux disease without esophagitis: Secondary | ICD-10-CM | POA: Diagnosis present

## 2020-12-11 DIAGNOSIS — I495 Sick sinus syndrome: Secondary | ICD-10-CM | POA: Diagnosis present

## 2020-12-11 DIAGNOSIS — I447 Left bundle-branch block, unspecified: Secondary | ICD-10-CM | POA: Diagnosis present

## 2020-12-11 DIAGNOSIS — I5043 Acute on chronic combined systolic (congestive) and diastolic (congestive) heart failure: Secondary | ICD-10-CM

## 2020-12-11 DIAGNOSIS — N179 Acute kidney failure, unspecified: Secondary | ICD-10-CM | POA: Diagnosis present

## 2020-12-11 DIAGNOSIS — I82509 Chronic embolism and thrombosis of unspecified deep veins of unspecified lower extremity: Secondary | ICD-10-CM | POA: Diagnosis present

## 2020-12-11 DIAGNOSIS — Z950 Presence of cardiac pacemaker: Secondary | ICD-10-CM

## 2020-12-11 LAB — CBC WITH DIFFERENTIAL/PLATELET
Abs Immature Granulocytes: 0.02 10*3/uL (ref 0.00–0.07)
Basophils Absolute: 0.1 10*3/uL (ref 0.0–0.1)
Basophils Relative: 1 %
Eosinophils Absolute: 0.1 10*3/uL (ref 0.0–0.5)
Eosinophils Relative: 1 %
HCT: 31.7 % — ABNORMAL LOW (ref 36.0–46.0)
Hemoglobin: 9.8 g/dL — ABNORMAL LOW (ref 12.0–15.0)
Immature Granulocytes: 0 %
Lymphocytes Relative: 12 %
Lymphs Abs: 0.9 10*3/uL (ref 0.7–4.0)
MCH: 28.3 pg (ref 26.0–34.0)
MCHC: 30.9 g/dL (ref 30.0–36.0)
MCV: 91.6 fL (ref 80.0–100.0)
Monocytes Absolute: 0.4 10*3/uL (ref 0.1–1.0)
Monocytes Relative: 6 %
Neutro Abs: 5.6 10*3/uL (ref 1.7–7.7)
Neutrophils Relative %: 80 %
Platelets: 397 10*3/uL (ref 150–400)
RBC: 3.46 MIL/uL — ABNORMAL LOW (ref 3.87–5.11)
RDW: 17.6 % — ABNORMAL HIGH (ref 11.5–15.5)
WBC: 7 10*3/uL (ref 4.0–10.5)
nRBC: 0 % (ref 0.0–0.2)

## 2020-12-11 LAB — COMPREHENSIVE METABOLIC PANEL
ALT: 15 U/L (ref 0–44)
AST: 23 U/L (ref 15–41)
Albumin: 3.6 g/dL (ref 3.5–5.0)
Alkaline Phosphatase: 95 U/L (ref 38–126)
Anion gap: 13 (ref 5–15)
BUN: 16 mg/dL (ref 8–23)
CO2: 21 mmol/L — ABNORMAL LOW (ref 22–32)
Calcium: 9.7 mg/dL (ref 8.9–10.3)
Chloride: 107 mmol/L (ref 98–111)
Creatinine, Ser: 1.1 mg/dL — ABNORMAL HIGH (ref 0.44–1.00)
GFR, Estimated: 50 mL/min — ABNORMAL LOW (ref >60)
Glucose, Bld: 116 mg/dL — ABNORMAL HIGH (ref 70–99)
Potassium: 3.7 mmol/L (ref 3.5–5.1)
Sodium: 141 mmol/L (ref 135–145)
Total Bilirubin: 1 mg/dL (ref 0.3–1.2)
Total Protein: 7.3 g/dL (ref 6.5–8.1)

## 2020-12-11 LAB — RESP PANEL BY RT-PCR (FLU A&B, COVID) ARPGX2
Influenza A by PCR: NEGATIVE
Influenza B by PCR: NEGATIVE
SARS Coronavirus 2 by RT PCR: NEGATIVE

## 2020-12-11 LAB — MRSA PCR SCREENING: MRSA by PCR: NEGATIVE

## 2020-12-11 LAB — GLUCOSE, CAPILLARY: Glucose-Capillary: 117 mg/dL — ABNORMAL HIGH (ref 70–99)

## 2020-12-11 LAB — TROPONIN I (HIGH SENSITIVITY)
Troponin I (High Sensitivity): 29 ng/L — ABNORMAL HIGH (ref <18)
Troponin I (High Sensitivity): 31 ng/L — ABNORMAL HIGH (ref <18)
Troponin I (High Sensitivity): 36 ng/L — ABNORMAL HIGH (ref <18)

## 2020-12-11 LAB — BRAIN NATRIURETIC PEPTIDE: B Natriuretic Peptide: 1285.4 pg/mL — ABNORMAL HIGH (ref 0.0–100.0)

## 2020-12-11 MED ORDER — FUROSEMIDE 10 MG/ML IJ SOLN
40.0000 mg | Freq: Two times a day (BID) | INTRAMUSCULAR | Status: DC
  Administered 2020-12-11 – 2020-12-13 (×5): 40 mg via INTRAVENOUS
  Filled 2020-12-11 (×5): qty 4

## 2020-12-11 MED ORDER — ALLOPURINOL 100 MG PO TABS
300.0000 mg | ORAL_TABLET | Freq: Every day | ORAL | Status: DC
  Administered 2020-12-11 – 2020-12-16 (×6): 300 mg via ORAL
  Filled 2020-12-11 (×5): qty 1
  Filled 2020-12-11: qty 3

## 2020-12-11 MED ORDER — AMIODARONE HCL 200 MG PO TABS
200.0000 mg | ORAL_TABLET | Freq: Two times a day (BID) | ORAL | Status: DC
  Administered 2020-12-11 – 2020-12-14 (×7): 200 mg via ORAL
  Filled 2020-12-11 (×7): qty 1

## 2020-12-11 MED ORDER — ONDANSETRON HCL 4 MG/2ML IJ SOLN
4.0000 mg | Freq: Four times a day (QID) | INTRAMUSCULAR | Status: DC | PRN
  Administered 2020-12-14: 4 mg via INTRAVENOUS
  Filled 2020-12-11: qty 2

## 2020-12-11 MED ORDER — SODIUM CHLORIDE 0.9% FLUSH
3.0000 mL | Freq: Two times a day (BID) | INTRAVENOUS | Status: DC
  Administered 2020-12-11 – 2020-12-16 (×10): 3 mL via INTRAVENOUS

## 2020-12-11 MED ORDER — ACETAMINOPHEN 325 MG PO TABS: 650.0000 mg | ORAL_TABLET | ORAL | Status: DC | PRN

## 2020-12-11 MED ORDER — DONEPEZIL HCL 5 MG PO TABS
10.0000 mg | ORAL_TABLET | Freq: Every day | ORAL | Status: DC
  Administered 2020-12-11 – 2020-12-15 (×5): 10 mg via ORAL
  Filled 2020-12-11 (×6): qty 2

## 2020-12-11 MED ORDER — METHYLPREDNISOLONE SODIUM SUCC 125 MG IJ SOLR
125.0000 mg | Freq: Once | INTRAMUSCULAR | Status: AC
  Administered 2020-12-11: 125 mg via INTRAVENOUS
  Filled 2020-12-11: qty 2

## 2020-12-11 MED ORDER — FUROSEMIDE 10 MG/ML IJ SOLN
20.0000 mg | Freq: Once | INTRAMUSCULAR | Status: AC
  Administered 2020-12-11: 20 mg via INTRAVENOUS
  Filled 2020-12-11: qty 4

## 2020-12-11 MED ORDER — ORAL CARE MOUTH RINSE
15.0000 mL | Freq: Two times a day (BID) | OROMUCOSAL | Status: DC
  Administered 2020-12-12 – 2020-12-15 (×8): 15 mL via OROMUCOSAL

## 2020-12-11 MED ORDER — METOPROLOL TARTRATE 25 MG PO TABS
50.0000 mg | ORAL_TABLET | Freq: Two times a day (BID) | ORAL | Status: DC
  Administered 2020-12-11 – 2020-12-16 (×10): 50 mg via ORAL
  Filled 2020-12-11 (×10): qty 1

## 2020-12-11 MED ORDER — ENSURE ENLIVE PO LIQD
237.0000 mL | Freq: Two times a day (BID) | ORAL | Status: DC
  Administered 2020-12-11 – 2020-12-16 (×9): 237 mL via ORAL

## 2020-12-11 MED ORDER — ISOSORB DINITRATE-HYDRALAZINE 20-37.5 MG PO TABS: 1.0000 | ORAL_TABLET | Freq: Three times a day (TID) | ORAL | Status: DC

## 2020-12-11 MED ORDER — MAGNESIUM OXIDE 400 (241.3 MG) MG PO TABS
400.0000 mg | ORAL_TABLET | Freq: Every day | ORAL | Status: DC
  Administered 2020-12-11 – 2020-12-16 (×6): 400 mg via ORAL
  Filled 2020-12-11 (×6): qty 1

## 2020-12-11 MED ORDER — SIMVASTATIN 10 MG PO TABS
10.0000 mg | ORAL_TABLET | Freq: Every day | ORAL | Status: DC
Start: 2020-12-11 — End: 2020-12-16
  Administered 2020-12-11 – 2020-12-15 (×5): 10 mg via ORAL
  Filled 2020-12-11 (×6): qty 1

## 2020-12-11 MED ORDER — ASPIRIN 81 MG PO CHEW
81.0000 mg | CHEWABLE_TABLET | Freq: Every day | ORAL | Status: DC
  Administered 2020-12-11 – 2020-12-16 (×6): 81 mg via ORAL
  Filled 2020-12-11 (×6): qty 1

## 2020-12-11 MED ORDER — CARBIDOPA-LEVODOPA 25-100 MG PO TABS
2.0000 | ORAL_TABLET | Freq: Three times a day (TID) | ORAL | Status: DC
  Administered 2020-12-11 – 2020-12-16 (×16): 2 via ORAL
  Filled 2020-12-11 (×17): qty 2

## 2020-12-11 MED ORDER — APIXABAN 2.5 MG PO TABS
2.5000 mg | ORAL_TABLET | Freq: Two times a day (BID) | ORAL | Status: DC
  Administered 2020-12-11 – 2020-12-16 (×11): 2.5 mg via ORAL
  Filled 2020-12-11 (×11): qty 1

## 2020-12-11 MED ORDER — AMLODIPINE BESYLATE 5 MG PO TABS
10.0000 mg | ORAL_TABLET | Freq: Every day | ORAL | Status: DC
  Administered 2020-12-11 – 2020-12-12 (×2): 10 mg via ORAL
  Filled 2020-12-11 (×2): qty 2

## 2020-12-11 MED ORDER — VITAMIN D 25 MCG (1000 UNIT) PO TABS
2000.0000 [IU] | ORAL_TABLET | Freq: Every day | ORAL | Status: DC
  Administered 2020-12-11 – 2020-12-16 (×6): 2000 [IU] via ORAL
  Filled 2020-12-11 (×6): qty 2

## 2020-12-11 MED ORDER — SODIUM CHLORIDE 0.9% FLUSH: 3.0000 mL | INTRAVENOUS | Status: DC | PRN

## 2020-12-11 MED ORDER — NITROGLYCERIN IN D5W 200-5 MCG/ML-% IV SOLN
0.0000 ug/min | INTRAVENOUS | Status: DC
  Administered 2020-12-11: 5 ug/min via INTRAVENOUS
  Filled 2020-12-11: qty 250

## 2020-12-11 MED ORDER — IPRATROPIUM-ALBUTEROL 0.5-2.5 (3) MG/3ML IN SOLN
3.0000 mL | Freq: Four times a day (QID) | RESPIRATORY_TRACT | Status: DC
  Administered 2020-12-11 – 2020-12-13 (×8): 3 mL via RESPIRATORY_TRACT
  Filled 2020-12-11 (×9): qty 3

## 2020-12-11 MED ORDER — CHLORHEXIDINE GLUCONATE CLOTH 2 % EX PADS
6.0000 | MEDICATED_PAD | Freq: Every day | CUTANEOUS | Status: DC
  Administered 2020-12-11 – 2020-12-15 (×4): 6 via TOPICAL

## 2020-12-11 MED ORDER — ADULT MULTIVITAMIN W/MINERALS CH
1.0000 | ORAL_TABLET | Freq: Every day | ORAL | Status: DC
  Administered 2020-12-11 – 2020-12-16 (×6): 1 via ORAL
  Filled 2020-12-11 (×6): qty 1

## 2020-12-11 MED ORDER — COLCHICINE 0.6 MG PO TABS: 0.6000 mg | ORAL_TABLET | ORAL | Status: DC

## 2020-12-11 MED ORDER — SODIUM CHLORIDE 0.9 % IV SOLN: 250.0000 mL | INTRAVENOUS | Status: DC | PRN

## 2020-12-11 MED ORDER — SENNOSIDES-DOCUSATE SODIUM 8.6-50 MG PO TABS
2.0000 | ORAL_TABLET | Freq: Two times a day (BID) | ORAL | Status: DC
  Administered 2020-12-11 – 2020-12-16 (×11): 2 via ORAL
  Filled 2020-12-11 (×12): qty 2

## 2020-12-11 MED ORDER — NITROGLYCERIN 0.4 MG SL SUBL: 0.4000 mg | SUBLINGUAL_TABLET | SUBLINGUAL | Status: DC | PRN

## 2020-12-11 MED ORDER — CARBIDOPA-LEVODOPA ER 25-100 MG PO TBCR
1.0000 | EXTENDED_RELEASE_TABLET | Freq: Every day | ORAL | Status: DC
  Administered 2020-12-11 – 2020-12-15 (×5): 1 via ORAL
  Filled 2020-12-11 (×7): qty 1

## 2020-12-11 MED ORDER — PANTOPRAZOLE SODIUM 40 MG PO TBEC
40.0000 mg | DELAYED_RELEASE_TABLET | Freq: Every day | ORAL | Status: DC
  Administered 2020-12-11 – 2020-12-16 (×6): 40 mg via ORAL
  Filled 2020-12-11 (×5): qty 1

## 2020-12-11 NOTE — ED Provider Notes (Signed)
[facility] Emergency Department Provider Note   ____________________________________________   Event Date/Time   First MD Initiated Contact with Patient 12/11/20 0825     (approximate)  I have reviewed the triage vital signs and the nursing notes.   HISTORY  Chief Complaint Shortness of Breath    HPI Melinda Maxwell is a 80 y.o. female with a stated past medical history of asthma, CAD, DVT, and hypertension who presents for shortness of breath is been worsening over the last 6 days.  Patient endorses dyspnea on exertion.  Patient denies any chest pain.  Patient denies any recent travel or sick contacts.  EMS noted expiratory wheezes over bilateral lung fields as well as hypertension in route.  Patient placed on 2 L nasal cannula prior to arrival.  Patient currently denies any vision changes, tinnitus, difficulty speaking, facial droop, sore throat, chest pain, abdominal pain, nausea/vomiting/diarrhea, dysuria, or weakness/numbness/paresthesias in any extremity         Past Medical History:  Diagnosis Date  . Arthritis   . Asthma without status asthmaticus    unspecified  . Breast cancer ([place])   . Coronary artery disease   . Diverticulosis   . DVT (deep venous thrombosis) ([place])   . Edema   . Gout   . Hyperlipidemia   . Hypertension   . MI, old   . Myocardial infarct ([place]) 2004  . Parkinson's disease ([place])   . Sleep apnea     Patient Active Problem List   Diagnosis Date Noted  . Sick sinus syndrome ([place])   . Hypokalemia 09/29/2020  . Type 2 diabetes mellitus without complication ([place]) 37/16/9678  . Chronic anticoagulation 09/29/2020  . Parkinson disease ([place]) 09/29/2020  . Subdural hematoma ([place]) 07/16/2019  . Closed displaced fracture of surgical neck of left humerus 07/16/2019  . Non-sustained ventricular tachycardia ([place]) 07/16/2019  . Chronic diastolic CHF (congestive heart failure) ([place]) 07/16/2019  . CKD (chronic kidney disease),  stage III ([place]) 07/16/2019  . Syncope and collapse   . Fall   . CAD (coronary artery disease) 03/26/2018  . Generalized weakness 03/26/2018  . Paroxysmal atrial fibrillation ([place]) 02/27/2018  . UTI (urinary tract infection) 02/18/2018  . Stable angina ([place]) 01/15/2018  . Hyperlipidemia, mixed 09/10/2014  . Moderate tricuspid insufficiency 09/10/2014  . Chronic deep vein thrombosis (DVT) ([facility]) 08/10/2014  . Diabetes mellitus with stage 3 chronic kidney disease ([place]) 07/09/2014  . Benign essential hypertension 02/21/2014  . Gout 02/21/2014  . OSA (obstructive sleep apnea) 02/21/2014    Past Surgical History:  Procedure Laterality Date  . ABDOMINAL HYSTERECTOMY    . APPENDECTOMY    . BREAST LUMPECTOMY     L breast  . CATARACT EXTRACTION    . COLONOSCOPY     08/27/1989, 01/03/1998, 06/09/2004  . CORONARY STENT INTERVENTION N/A 02/11/2018   Procedure: CORONARY STENT INTERVENTION;  Surgeon: Nelva Bush, MD;  Location: [facility];  Service: Cardiovascular;  Laterality: N/A;  . ESOPHAGOGASTRODUODENOSCOPY  06/09/2004  . FLEXIBLE SIGMOIDOSCOPY    . HEMICOLECTOMY    . LEFT HEART CATH AND CORONARY ANGIOGRAPHY Left 02/11/2018   Procedure: LEFT HEART CATH AND CORONARY ANGIOGRAPHY;  Surgeon: Corey Skains, MD;  Location: [facility];  Service: Cardiovascular;  Laterality: Left;  . ORIF ANKLE FRACTURE  2004  . PACEMAKER LEADLESS INSERTION N/A 10/05/2020   Procedure: PACEMAKER LEADLESS INSERTION;  Surgeon: Isaias Cowman, MD;  Location: [facility];  Service: Cardiovascular;  Laterality: N/A;  Prior to Admission medications   Medication Sig Start Date End Date Taking? Authorizing Provider  allopurinol (ZYLOPRIM) 300 MG tablet Take 300 mg by mouth daily.    [provider]  amiodarone (PACERONE) 200 MG tablet Take 1 tablet (200 mg total) by mouth 2 (two) times daily. 10/06/20   Fritzi Mandes, MD  apixaban (ELIQUIS) 2.5 MG TABS tablet Take 1  tablet (2.5 mg total) by mouth 2 (two) times daily. 10/06/20   Fritzi Mandes, MD  aspirin 81 MG chewable tablet Chew 1 tablet (81 mg total) by mouth daily. 07/25/19   Kayleen Memos, DO  carbidopa-levodopa (SINEMET IR) 25-100 MG tablet Take 1-2 tablets by mouth 4 (four) times daily. Take 2 tablets at breakfast, lunch and dinner Take 1  tablet at bedtime    [provider]  Cholecalciferol (VITAMIN D3) 50 MCG (2000 UT) capsule Take by mouth. 11/12/18   [provider]  colchicine 0.6 MG tablet Take by mouth. 10/06/18   [provider]  donepezil (ARICEPT) 10 MG tablet Take 10 mg by mouth at bedtime.    [provider]  feeding supplement (ENSURE ENLIVE / ENSURE PLUS) LIQD Take 237 mLs by mouth 2 (two) times daily between meals. 10/06/20   Fritzi Mandes, MD  magnesium oxide (MAG-OX) 400 (241.3 Mg) MG tablet Take 1 tablet (400 mg total) by mouth daily. 10/06/20   Fritzi Mandes, MD  metoprolol tartrate (LOPRESSOR) 50 MG tablet Take 1 tablet (50 mg total) by mouth 2 (two) times daily. 10/06/20   Fritzi Mandes, MD  mometasone (ELOCON) 0.1 % lotion Apply 1 application topically at bedtime as needed.    [provider]  Multiple Vitamin (MULTIVITAMIN WITH MINERALS) TABS tablet Take 1 tablet by mouth daily. 10/06/20   Fritzi Mandes, MD  nitroGLYCERIN (NITROSTAT) 0.4 MG SL tablet Place 0.4 mg under the tongue every 5 (five) minutes as needed for chest pain.  12/31/17   [provider]  pantoprazole (PROTONIX) 40 MG tablet Take 40 mg by mouth daily.  12/31/17   [provider]  senna-docusate (SENOKOT-S) 8.6-50 MG tablet Take 2 tablets by mouth 2 (two) times daily. 10/06/20   Fritzi Mandes, MD  simvastatin (ZOCOR) 10 MG tablet Take 10 mg by mouth daily. HLD- reduced d/t interaction with Diltiazem- increased r/f Rhabdomyelosis    [provider]    Allergies Benzocaine, Lisinopril, Neomycin, Sulfamethoxazole-trimethoprim, and Sulfa antibiotics  Family  History  Problem Relation Age of Onset  . Angina Mother   . Hypertension Mother   . Lung cancer Mother   . Hypertension Father   . Heart attack Father   . Nephrolithiasis Sister   . Hypertension Other        sibling    Social History Social History   Tobacco Use  . Smoking status: Never Smoker  . Smokeless tobacco: Never Used  Vaping Use  . Vaping Use: Never used  Substance Use Topics  . Alcohol use: No  . Drug use: Never    Review of Systems Constitutional: No fever/chills Eyes: No visual changes. ENT: No sore throat. Cardiovascular: Denies chest pain. Respiratory: Endorses shortness of breath. Gastrointestinal: No abdominal pain.  No nausea, no vomiting.  No diarrhea. Genitourinary: Negative for dysuria. Musculoskeletal: Negative for acute arthralgias Skin: Negative for rash. Neurological: Negative for headaches, weakness/numbness/paresthesias in any extremity Psychiatric: Negative for suicidal ideation/homicidal ideation   ____________________________________________   PHYSICAL EXAM:  VITAL SIGNS: ED Triage Vitals [12/11/20 0823]  Enc Vitals Group  BP      Pulse      Resp      Temp      Temp src      SpO2      Weight 124 lb (56.2 kg)     Height 5\' 1"  (1.549 m)     Head Circumference      Peak Flow      Pain Score 0     Pain Loc      Pain Edu?      Excl. in [place]?    Constitutional: Alert and oriented. Well appearing and in no acute distress. Eyes: Conjunctivae are normal. PERRL. Head: Atraumatic. Nose: No congestion/rhinnorhea. Mouth/Throat: Mucous membranes are moist. Neck: No stridor Cardiovascular: Grossly normal heart sounds.  Good peripheral circulation. Respiratory: Tachypneic.  Expiratory wheezes over bilateral lung fields. Gastrointestinal: Soft and nontender. No distention. Musculoskeletal: No obvious deformities Neurologic:  Normal speech and language. No gross focal neurologic deficits are appreciated. Skin:  Skin is warm and dry.  No rash noted. Psychiatric: Mood and affect are normal. Speech and behavior are normal.  ____________________________________________   LABS (all labs ordered are listed, but only abnormal results are displayed)  Labs Reviewed  COMPREHENSIVE METABOLIC PANEL - Abnormal; Notable for the following components:      Result Value   CO2 21 (*)    Glucose, Bld 116 (*)    Creatinine, Ser 1.10 (*)    GFR, Estimated 50 (*)    All other components within normal limits  BRAIN NATRIURETIC PEPTIDE - Abnormal; Notable for the following components:   B Natriuretic Peptide 1,285.4 (*)    All other components within normal limits  CBC WITH DIFFERENTIAL/PLATELET - Abnormal; Notable for the following components:   RBC 3.46 (*)    Hemoglobin 9.8 (*)    HCT 31.7 (*)    RDW 17.6 (*)    All other components within normal limits  RESP PANEL BY RT-PCR (FLU A&B, COVID) ARPGX2  TROPONIN I (HIGH SENSITIVITY)   ____________________________________________  EKG  ED ECG REPORT I, Naaman Plummer, the attending physician, personally viewed and interpreted this ECG.  Date: 12/11/2020 EKG Time: 0826 Rate: 79 Rhythm: normal sinus rhythm QRS Axis: normal Intervals: IVCD ST/T Wave abnormalities: normal Narrative Interpretation: no evidence of acute ischemia  ____________________________________________  RADIOLOGY  ED MD interpretation: Single view portable x-ray of the chest shows bilateral atelectasis as well as interstitial edema likely related to her heart failure  Official radiology report(s): DG Chest Port 1 View  Result Date: 12/11/2020 CLINICAL DATA:  80 year old female with shortness of breath. EXAM: PORTABLE CHEST 1 VIEW COMPARISON:  09/28/2020 FINDINGS: The heart size and mediastinal contours are within normal limits. Right juxtaphrenic peak sign. Blunting of the right costophrenic angle. Similar appearing lingular cicatricial atelectasis. Left basilar subsegmental atelectasis. No pneumothorax.  The visualized skeletal structures are unremarkable. IMPRESSION: Right middle lobar atelectasis. Left basilar subsegmental atelectasis. Consider chest CT for further characterization in the appropriate clinical setting. Electronically Signed   By: Ruthann Cancer MD   On: 12/11/2020 09:19    ____________________________________________   PROCEDURES  Procedure(s) performed (including Critical Care):  .Critical Care Performed by: Naaman Plummer, MD Authorized by: Naaman Plummer, MD   Critical care provider statement:    Critical care time (minutes):  35   Critical care time was exclusive of:  Separately billable procedures and treating other patients   Critical care was necessary to treat or prevent imminent or life-threatening deterioration of  the following conditions:  Respiratory failure   Critical care was time spent personally by me on the following activities:  Discussions with consultants, evaluation of patient's response to treatment, examination of patient, ordering and performing treatments and interventions, ordering and review of laboratory studies, ordering and review of radiographic studies, pulse oximetry, re-evaluation of patient's condition, obtaining history from patient or surrogate and review of old charts   I assumed direction of critical care for this patient from another provider in my specialty: no     Care discussed with: admitting provider   .1-3 Lead EKG Interpretation Performed by: Naaman Plummer, MD Authorized by: Naaman Plummer, MD     Interpretation: normal     ECG rate:  76   ECG rate assessment: normal     Rhythm: sinus rhythm     Ectopy: none     Conduction: normal       ____________________________________________   INITIAL IMPRESSION / ASSESSMENT AND PLAN / ED COURSE  As part of my medical decision making, I reviewed the following data within the [place] notes reviewed and incorporated, Labs reviewed, EKG  interpreted, Old chart reviewed, Radiograph reviewed and Notes from prior ED visits reviewed and incorporated        + dyspnea +LE edema - Non adherence to medication regimen  Workup: ECG, CBC, BMP, Troponin, BNP, CXR Findings: EKG: No STEMI and no evidence of Brugadas sign, delta wave, epsilon wave, significantly prolonged QTc, or malignant arrhythmia. BNP: 1285 CXR: Bilateral atelectasis and pulmonary edema Based on history, exam and findings, presentation most consistent with acute on chronic heart failure. Low suspicion for PNA, ACS, tamponade, aortic dissection. Interventions: Oxygen, Diuresis  Reassessment: Symptoms improved in ED with oxygen and diuresis Disposition (Stable but not significantly improved): Admit to medicine for further monitoring and for improvement of medication regimen to control symptoms.       ____________________________________________   FINAL CLINICAL IMPRESSION(S) / ED DIAGNOSES  Final diagnoses:  Hypoxia  SOB (shortness of breath)  Acute pulmonary edema (H[facility])  Acute on chronic congestive heart failure, unspecified heart failure type ([place])  Mild persistent asthma with exacerbation     ED Discharge Orders    None       Note:  This document was prepared using Dragon voice recognition software and may include unintentional dictation errors.   Naaman Plummer, MD 12/11/20 [phone]

## 2020-12-11 NOTE — Consult Note (Signed)
[facility] Cardiology  CARDIOLOGY CONSULT NOTE  [name] MRN: [id number] DOB/AGE: December 23, 1940 80 y.o.  Admit date: 12/11/2020 Referring Physician Agbata Primary Physician [facility] Cardiologist Nehemiah Massed Reason for Consultation acute on chronic diastolic congestive heart failure  HPI: 80 year old female referred for evaluation of acute on chronic diastolic congestive heart failure in the setting of hypertensive emergency.  The patient has known history of coronary artery disease, status post multiple coronary stents, accessible atrial fibrillation and DVT on Eliquis, sick sinus syndrome, status post Micra AV leadless pacemaker.  She presents with chief complaint of shortness of breath, noted to be markedly hypertensive with a blood pressure of 235/103.  Also complaining of pedal edema and orthopnea.  ECG revealed atrial sensing with ventricular pacing.  Chest x-ray revealed right middle lobar atelectasis.  Chest CT revealed pulmonary edema with moderate right greater than left pleural effusions.  Admission labs notable for high-sensitivity troponin of 29, and BNP of 1284.  Of note, metoprolol tartrate was recently discontinued by her primary care provider 3 weeks ago.  Patient treated with nitroglycerin infusion, metoprolol tartrate 50 mg twice daily was restarted, amlodipine 10 mg daily was started, and patient treated with furosemide 40 mg IV twice daily, with overall clinical improvement.  Blood pressure currently 193/101.  The patient denies chest pain.  Review of systems complete and found to be negative unless listed above     Past Medical History:  Diagnosis Date  . Arthritis   . Asthma without status asthmaticus    unspecified  . Breast cancer ([place])   . Coronary artery disease   . Diverticulosis   . DVT (deep venous thrombosis) ([place])   . Edema   . Gout   . Hyperlipidemia   . Hypertension   . MI, old   . Myocardial infarct ([place]) 2004  . Parkinson's disease ([place])    . Sleep apnea     Past Surgical History:  Procedure Laterality Date  . ABDOMINAL HYSTERECTOMY    . APPENDECTOMY    . BREAST LUMPECTOMY     L breast  . CATARACT EXTRACTION    . COLONOSCOPY     08/27/1989, 01/03/1998, 06/09/2004  . CORONARY STENT INTERVENTION N/A 02/11/2018   Procedure: CORONARY STENT INTERVENTION;  Surgeon: Nelva Bush, MD;  Location: [facility];  Service: Cardiovascular;  Laterality: N/A;  . ESOPHAGOGASTRODUODENOSCOPY  06/09/2004  . FLEXIBLE SIGMOIDOSCOPY    . HEMICOLECTOMY    . LEFT HEART CATH AND CORONARY ANGIOGRAPHY Left 02/11/2018   Procedure: LEFT HEART CATH AND CORONARY ANGIOGRAPHY;  Surgeon: Corey Skains, MD;  Location: [place] [facility];  Service: Cardiovascular;  Laterality: Left;  . ORIF ANKLE FRACTURE  2004  . PACEMAKER LEADLESS INSERTION N/A 10/05/2020   Procedure: PACEMAKER LEADLESS INSERTION;  Surgeon: Isaias Cowman, MD;  Location: [place] [facility];  Service: Cardiovascular;  Laterality: N/A;    Medications Prior to Admission  Medication Sig Dispense Refill Last Dose  . allopurinol (ZYLOPRIM) 300 MG tablet Take 300 mg by mouth daily.   12/10/2020 at Unknown time  . amiodarone (PACERONE) 200 MG tablet Take 1 tablet (200 mg total) by mouth 2 (two) times daily. 60 tablet 0 12/10/2020 at 0830  . apixaban (ELIQUIS) 2.5 MG TABS tablet Take 1 tablet (2.5 mg total) by mouth 2 (two) times daily. 60 tablet 1 12/10/2020 at 0830  . aspirin 81 MG chewable tablet Chew 1 tablet (81 mg total) by mouth daily. 30 tablet 0 12/10/2020 at 0830  .  carbidopa-levodopa (SINEMET IR) 25-100 MG tablet Take 1-2 tablets by mouth 4 (four) times daily. Take 2 tablets at breakfast, lunch and dinner Take 1  tablet at bedtime   12/10/2020 at Unknown time  . Cholecalciferol (VITAMIN D3) 50 MCG (2000 UT) capsule Take 2,000 Units by mouth daily.   12/10/2020 at 0830  . donepezil (ARICEPT) 10 MG tablet Take 10 mg by mouth at bedtime.   12/10/2020 at Unknown time  . Multiple  Vitamin (MULTIVITAMIN WITH MINERALS) TABS tablet Take 1 tablet by mouth daily. 30 tablet 0 12/10/2020 at 0830  . pantoprazole (PROTONIX) 40 MG tablet Take 40 mg by mouth daily.   2 12/10/2020 at Unknown time  . simvastatin (ZOCOR) 10 MG tablet Take 10 mg by mouth daily. HLD- reduced d/t interaction with Diltiazem- increased r/f Rhabdomyelosis   12/10/2020 at 2000  . Carbidopa-Levodopa ER (SINEMET CR) 25-100 MG tablet controlled release Take by mouth. (Patient not taking: No sig reported)   Not Taking at Unknown time  . colchicine 0.6 MG tablet Take 0.6 mg by mouth as directed. (Patient not taking: No sig reported)   Not Taking at Unknown time  . feeding supplement (ENSURE ENLIVE / ENSURE PLUS) LIQD Take 237 mLs by mouth 2 (two) times daily between meals. 237 mL 12   . magnesium oxide (MAG-OX) 400 (241.3 Mg) MG tablet Take 1 tablet (400 mg total) by mouth daily. (Patient not taking: Reported on 12/11/2020) 30 tablet 1 Not Taking at Unknown time  . metoprolol tartrate (LOPRESSOR) 50 MG tablet Take 1 tablet (50 mg total) by mouth 2 (two) times daily. (Patient not taking: No sig reported) 60 tablet 1 Not Taking at Unknown time  . mometasone (ELOCON) 0.1 % cream Apply 1 application topically daily.   prn at prn  . mometasone (ELOCON) 0.1 % lotion Apply 1 application topically at bedtime as needed. (Patient not taking: Reported on 12/11/2020)   Not Taking at Unknown time  . nitroGLYCERIN (NITROSTAT) 0.4 MG SL tablet Place 0.4 mg under the tongue every 5 (five) minutes as needed for chest pain.   0 unknown at prn  . senna-docusate (SENOKOT-S) 8.6-50 MG tablet Take 2 tablets by mouth 2 (two) times daily. 30 tablet 0 unknown at prn   Social History   Socioeconomic History  . Marital status: Divorced    Spouse name: Not on file  . Number of children: 1  . Years of education: 64  . Highest education level: High school graduate  Occupational History  . Not on file  Tobacco Use  . Smoking status: Never Smoker  .  Smokeless tobacco: Never Used  Vaping Use  . Vaping Use: Never used  Substance and Sexual Activity  . Alcohol use: No  . Drug use: Never  . Sexual activity: Not on file  Other Topics Concern  . Not on file  Social History Narrative  . Not on file   Social Determinants of Health   Financial Resource Strain: Not on file  Food Insecurity: Not on file  Transportation Needs: Not on file  Physical Activity: Not on file  Stress: Not on file  Social Connections: Not on file  Intimate Partner Violence: Not on file    Family History  Problem Relation Age of Onset  . Angina Mother   . Hypertension Mother   . Lung cancer Mother   . Hypertension Father   . Heart attack Father   . Nephrolithiasis Sister   . Hypertension Other  sibling      Review of systems complete and found to be negative unless listed above      PHYSICAL EXAM  General: Well developed, well nourished, in no acute distress HEENT:  Normocephalic and atramatic Neck:  No JVD.  Lungs: Clear bilaterally to auscultation and percussion. Heart: HRRR . Normal S1 and S2 without gallops or murmurs.  Abdomen: Bowel sounds are positive, abdomen soft and non-tender  Msk:  Back normal, normal gait. Normal strength and tone for age. Extremities: No clubbing, cyanosis or edema.   Neuro: Alert and oriented X 3. Psych:  Good affect, responds appropriately  Labs:   Lab Results  Component Value Date   WBC 7.0 12/11/2020   HGB 9.8 (L) 12/11/2020   HCT 31.7 (L) 12/11/2020   MCV 91.6 12/11/2020   PLT 397 12/11/2020    Recent Labs  Lab 12/11/20 0828  NA 141  K 3.7  CL 107  CO2 21*  BUN 16  CREATININE 1.10*  CALCIUM 9.7  PROT 7.3  BILITOT 1.0  ALKPHOS 95  ALT 15  AST 23  GLUCOSE 116*   Lab Results  Component Value Date   CKTOTAL 29 (L) 09/28/2020   TROPONINI 0.30 ([place]) 02/19/2018   No results found for: CHOL No results found for: HDL No results found for: LDLCALC No results found for: TRIG No  results found for: CHOLHDL No results found for: LDLDIRECT    Radiology: CT CHEST WO CONTRAST  Result Date: 12/11/2020 CLINICAL DATA:  Shortness of breath EXAM: CT CHEST WITHOUT CONTRAST TECHNIQUE: Multidetector CT imaging of the chest was performed following the standard protocol without IV contrast. COMPARISON:  January 21, 2016 FINDINGS: Cardiovascular: Cardiomegaly. Three-vessel coronary artery atherosclerotic calcifications. Atherosclerotic calcifications throughout the aorta. No pericardial effusion. Mediastinum/Nodes: Thyroid is unremarkable. No axillary adenopathy. Mildly enlarged mediastinal lymph nodes. Pretracheal lymph node measures 13 mm in the short axis, previously 11 mm (series 2, image 54). Pretracheal lymph node measures 13 mm in the short axis, previously 10 mm (series 2, image 45). Lungs/Pleura: Moderate RIGHT greater than LEFT pleural effusion. Interlobular septal thickening. Centrilobular ground-glass opacities in an upper lobe predominant distribution. Bronchial wall thickening. Bibasilar consolidative opacities, likely atelectasis. Upper Abdomen: No acute abnormality. Musculoskeletal: Coarsely calcified LEFT lumpectomy bed. Dextroscoliosis of the thoracolumbar junction. IMPRESSION: 1. Constellation of findings are favored to reflect pulmonary edema with moderate RIGHT greater than LEFT pleural effusions and bibasilar atelectasis. Atypical infection could present similarly. 2. Mildly enlarged mediastinal lymph nodes, likely reactive. Aortic Atherosclerosis (ICD10-I70.0). Electronically Signed   By: Valentino Saxon MD   On: 12/11/2020 10:59   DG Chest Port 1 View  Result Date: 12/11/2020 CLINICAL DATA:  80 year old female with shortness of breath. EXAM: PORTABLE CHEST 1 VIEW COMPARISON:  09/28/2020 FINDINGS: The heart size and mediastinal contours are within normal limits. Right juxtaphrenic peak sign. Blunting of the right costophrenic angle. Similar appearing lingular cicatricial  atelectasis. Left basilar subsegmental atelectasis. No pneumothorax. The visualized skeletal structures are unremarkable. IMPRESSION: Right middle lobar atelectasis. Left basilar subsegmental atelectasis. Consider chest CT for further characterization in the appropriate clinical setting. Electronically Signed   By: Ruthann Cancer MD   On: 12/11/2020 09:19    EKG: Atrial sensing with ventricular pacing  ASSESSMENT AND PLAN:   1.  Acute on chronic diastolic congestive heart failure, exacerbated by hypertensive emergency, improved after initial diuresis, BNP 1285 2.  Hypertensive emergency, with recent discontinuation of metoprolol tartrate, improved after nitroglycerin infusion, restarting metoprolol tartrate, with addition  of amlodipine 3.  Coronary artery disease, status post multiple coronary stents, most recently drug-eluting stents proximal and distal LAD 02/11/2018, only without chest pain 4.  Borderline elevated troponin, in the absence of chest pain, likely secondary to demand supply ischemia, in the setting of hypertensive emergency and acute on chronic diastolic congestive heart failure 5.  Paroxysmal atrial fibrillation, on Eliquis for stroke prevention 6.  Sick sinus syndrome, status post Micra AV leadless pacemaker 10/05/2020  Recommendations  1.  Agree with overall current therapy 2.  Continue diuresis 3.  Carefully monitor renal status 4.  Continue nitroglycerin infusion for now 5.  Continue metoprolol tartrate 50 mg twice daily 6.  Agree with starting amlodipine 10 mg daily 7.  Further recommendations pending patient's initial clinical course  Signed: Isaias Cowman MD,PhD, [facility] 12/11/2020, 11:39 AM

## 2020-12-11 NOTE — ED Notes (Signed)
Pt assisted to use bedside commode

## 2020-12-11 NOTE — ED Notes (Signed)
Dr Agbata at bedside. 

## 2020-12-11 NOTE — Plan of Care (Signed)
Pt is progress, off nitro  gtt. VSS.

## 2020-12-11 NOTE — Progress Notes (Signed)
Patient alert and oriented. No complaints of chest pain or shortness of breath. Patient is on 2 liters. Nitro drip discontinued per attending orders. Patient tolerating diet and external catheter intact. Continue to assess.

## 2020-12-11 NOTE — H&P (Addendum)
History and Physical    Melinda Maxwell MHD:[id number] DOB: 05/18/1941 DOA: 12/11/2020  PCP: [name], MD   Patient coming from: Home  I have personally briefly reviewed patient's old medical records in [place]  Chief Complaint: Shortness of breath  HPI: Melinda Maxwell is an 80 y.o. female with medical history significant for  CAD, chronic diastolic CHF, paroxysmal A. Fib/DVT on Xarelto, Parkinson's disease, chronic dizziness and physical deconditioning, HTN who presents to the emergency room by EMS for evaluation of worsening shortness of breath over the last 5 days.  Patient states that initially she was short of breath with exertion but now she is short of breath at rest.  This is associated with bilateral lower extremity swelling, two-pillow orthopnea as well as expiratory wheezes.  She denies having any cough, no chest pain, no palpitations, no diaphoresis, no nausea or vomiting.   She denies having any headache, no blurred vision, no focal deficits, no sore throat, no abdominal pain, no diarrhea, no constipation, no urinary frequency, no nocturia, no dysuria, no weakness, no paresthesia or numbness. Labs show sodium 141, potassium 3.7, chloride 107, bicarb 21, glucose 116, BUN 16, creatinine 1.1, calcium 9.7, alkaline phosphatase 75, albumin 3.6, AST 23, ALT 15, total protein 7.3, BNP 1285, troponin 29, white count 7.0, hemoglobin 9.8, hematocrit 31.7, RDW 17.6, platelet count 397 Respiratory viral panel Chest x-ray reviewed by me shows no acute findings Twelve-lead EKG reviewed by me shows a left bundle branch block    ED Course: Patient is an elderly Caucasian female who presents to the ER for evaluation of worsening shortness of breath over the last 5 days.  Patient is noted to have significant elevated blood pressure with systolic blood pressure over 252mmHg and was started on a nitroglycerin drip.  She also has diffuse expiratory wheezing.  Patient's daughter states  that her metoprolol was discontinued by her primary care provider about 3 weeks ago and patient has not been on any antihypertensive medications.  Patient will be admitted to the hospital for hypertensive emergency with CHF.   Review of Systems: As per HPI otherwise all other systems reviewed and negative.    Past Medical History:  Diagnosis Date  . Arthritis   . Asthma without status asthmaticus    unspecified  . Breast cancer ([place])   . Coronary artery disease   . Diverticulosis   . DVT (deep venous thrombosis) ([place])   . Edema   . Gout   . Hyperlipidemia   . Hypertension   . MI, old   . Myocardial infarct ([place]) 2004  . Parkinson's disease ([place])   . Sleep apnea     Past Surgical History:  Procedure Laterality Date  . ABDOMINAL HYSTERECTOMY    . APPENDECTOMY    . BREAST LUMPECTOMY     L breast  . CATARACT EXTRACTION    . COLONOSCOPY     08/27/1989, 01/03/1998, 06/09/2004  . CORONARY STENT INTERVENTION N/A 02/11/2018   Procedure: CORONARY STENT INTERVENTION;  Surgeon: Nelva Bush, MD;  Location: [facility];  Service: Cardiovascular;  Laterality: N/A;  . ESOPHAGOGASTRODUODENOSCOPY  06/09/2004  . FLEXIBLE SIGMOIDOSCOPY    . HEMICOLECTOMY    . LEFT HEART CATH AND CORONARY ANGIOGRAPHY Left 02/11/2018   Procedure: LEFT HEART CATH AND CORONARY ANGIOGRAPHY;  Surgeon: Corey Skains, MD;  Location: [facility];  Service: Cardiovascular;  Laterality: Left;  . ORIF ANKLE FRACTURE  2004  . PACEMAKER LEADLESS INSERTION N/A  10/05/2020   Procedure: PACEMAKER LEADLESS INSERTION;  Surgeon: Isaias Cowman, MD;  Location: [facility];  Service: Cardiovascular;  Laterality: N/A;     reports that she has never smoked. She has never used smokeless tobacco. She reports that she does not drink alcohol and does not use drugs.  Allergies  Allergen Reactions  . Benzocaine Other (See Comments)    Unknown  . Lisinopril Cough  . Neomycin Other (See  Comments)    On allergy testing  . Sulfa Antibiotics Rash  . Sulfamethoxazole-Trimethoprim Other (See Comments)    Unknown    Family History  Problem Relation Age of Onset  . Angina Mother   . Hypertension Mother   . Lung cancer Mother   . Hypertension Father   . Heart attack Father   . Nephrolithiasis Sister   . Hypertension Other        sibling      Prior to Admission medications   Medication Sig Start Date End Date Taking? Authorizing Provider  allopurinol (ZYLOPRIM) 300 MG tablet Take 300 mg by mouth daily.    [provider]  amiodarone (PACERONE) 200 MG tablet Take 1 tablet (200 mg total) by mouth 2 (two) times daily. 10/06/20   Fritzi Mandes, MD  apixaban (ELIQUIS) 2.5 MG TABS tablet Take 1 tablet (2.5 mg total) by mouth 2 (two) times daily. 10/06/20   Fritzi Mandes, MD  aspirin 81 MG chewable tablet Chew 1 tablet (81 mg total) by mouth daily. 07/25/19   Kayleen Memos, DO  carbidopa-levodopa (SINEMET IR) 25-100 MG tablet Take 1-2 tablets by mouth 4 (four) times daily. Take 2 tablets at breakfast, lunch and dinner Take 1  tablet at bedtime    [provider]  Carbidopa-Levodopa ER (SINEMET CR) 25-100 MG tablet controlled release Take by mouth. 09/16/20   [provider]  Cholecalciferol (VITAMIN D3) 50 MCG (2000 UT) capsule Take 2,000 Units by mouth daily. 11/12/18   [provider]  colchicine 0.6 MG tablet Take 0.6 mg by mouth as directed. 10/06/18   [provider]  donepezil (ARICEPT) 10 MG tablet Take 10 mg by mouth at bedtime.    [provider]  feeding supplement (ENSURE ENLIVE / ENSURE PLUS) LIQD Take 237 mLs by mouth 2 (two) times daily between meals. 10/06/20   Fritzi Mandes, MD  magnesium oxide (MAG-OX) 400 (241.3 Mg) MG tablet Take 1 tablet (400 mg total) by mouth daily. 10/06/20   Fritzi Mandes, MD  metoprolol tartrate (LOPRESSOR) 50 MG tablet Take 1 tablet (50 mg total) by mouth 2 (two) times daily. 10/06/20   Fritzi Mandes, MD  mometasone (ELOCON) 0.1 % cream Apply 1 application topically daily. 11/04/20   [provider]  mometasone (ELOCON) 0.1 % lotion Apply 1 application topically at bedtime as needed.    [provider]  Multiple Vitamin (MULTIVITAMIN WITH MINERALS) TABS tablet Take 1 tablet by mouth daily. 10/06/20   Fritzi Mandes, MD  nitroGLYCERIN (NITROSTAT) 0.4 MG SL tablet Place 0.4 mg under the tongue every 5 (five) minutes as needed for chest pain.  12/31/17   [provider]  pantoprazole (PROTONIX) 40 MG tablet Take 40 mg by mouth daily.  12/31/17   [provider]  senna-docusate (SENOKOT-S) 8.6-50 MG tablet Take 2 tablets by mouth 2 (two) times daily. 10/06/20   Fritzi Mandes, MD  simvastatin (ZOCOR) 10 MG tablet Take 10 mg by mouth daily. HLD- reduced d/t interaction with Diltiazem- increased r/f Rhabdomyelosis  [provider]    Physical Exam: Vitals:   12/11/20 0930 12/11/20 0955 12/11/20 1000 12/11/20 1005  BP: (!) 235/103 (!) 222/100 (!) 220/86 (!) 215/86  Pulse: 74 60 67 68  Resp: 19 (!) 22 (!) 24 (!) 23  Temp:      TempSrc:      SpO2: 95% 94% 95% 94%  Weight:      Height:         Vitals:   12/11/20 0930 12/11/20 0955 12/11/20 1000 12/11/20 1005  BP: (!) 235/103 (!) 222/100 (!) 220/86 (!) 215/86  Pulse: 74 60 67 68  Resp: 19 (!) 22 (!) 24 (!) 23  Temp:      TempSrc:      SpO2: 95% 94% 95% 94%  Weight:      Height:          Constitutional: Alert and oriented x 3 .  Patient has conversational dyspnea.  Appears to be in mild respiratory distress.   HEENT:      Head: Normocephalic and atraumatic.         Eyes: PERLA, EOMI, Conjunctivae are normal. Sclera is non-icteric.       Mouth/Throat: Mucous membranes are moist.       Neck: Supple with no signs of meningismus. Cardiovascular: Regular rate and rhythm. No murmurs, gallops, or rubs. 2+ symmetrical distal pulses are present . + JVD. 1+ LE edema Respiratory:  Tachypnea.diffuse  wheezes bilaterally, faint crackles at the bases Gastrointestinal: Soft, non tender, and non distended with positive bowel sounds.  Genitourinary: No CVA tenderness. Musculoskeletal: Nontender with normal range of motion in all extremities. No cyanosis, or erythema of extremities. Neurologic:  Face is symmetric. Moving all extremities. No gross focal neurologic deficits  Skin: Skin is warm, dry.  No rash or ulcers Psychiatric: Mood and affect are normal   Labs on Admission: I have personally reviewed following labs and imaging studies  CBC: Recent Labs  Lab 12/11/20 0828  WBC 7.0  NEUTROABS 5.6  HGB 9.8*  HCT 31.7*  MCV 91.6  PLT 500   Basic Metabolic Panel: Recent Labs  Lab 12/11/20 0828  NA 141  K 3.7  CL 107  CO2 21*  GLUCOSE 116*  BUN 16  CREATININE 1.10*  CALCIUM 9.7   GFR: Estimated Creatinine Clearance: 30.3 mL/min (A) (by C-G formula based on SCr of 1.1 mg/dL (H)). Liver Function Tests: Recent Labs  Lab 12/11/20 0828  AST 23  ALT 15  ALKPHOS 95  BILITOT 1.0  PROT 7.3  ALBUMIN 3.6   No results for input(s): LIPASE, AMYLASE in the last 168 hours. No results for input(s): AMMONIA in the last 168 hours. Coagulation Profile: No results for input(s): INR, PROTIME in the last 168 hours. Cardiac Enzymes: No results for input(s): CKTOTAL, CKMB, CKMBINDEX, TROPONINI in the last 168 hours. BNP (last 3 results) No results for input(s): PROBNP in the last 8760 hours. HbA1C: No results for input(s): HGBA1C in the last 72 hours. CBG: No results for input(s): GLUCAP in the last 168 hours. Lipid Profile: No results for input(s): CHOL, HDL, LDLCALC, TRIG, CHOLHDL, LDLDIRECT in the last 72 hours. Thyroid Function Tests: No results for input(s): TSH, T4TOTAL, FREET4, T3FREE, THYROIDAB in the last 72 hours. Anemia Panel: No results for input(s): VITAMINB12, FOLATE, FERRITIN, TIBC, IRON, RETICCTPCT in the last 72 hours. Urine analysis:    Component Value  Date/Time   COLORURINE YELLOW (A) 09/28/2020 1221   APPEARANCEUR CLOUDY (A) 09/28/2020 1221  LABSPEC 1.015 09/28/2020 1221   PHURINE 6.0 09/28/2020 1221   GLUCOSEU NEGATIVE 09/28/2020 1221   HGBUR SMALL (A) 09/28/2020 1221   BILIRUBINUR NEGATIVE 09/28/2020 1221   KETONESUR 5 (A) 09/28/2020 1221   PROTEINUR 100 (A) 09/28/2020 1221   NITRITE NEGATIVE 09/28/2020 1221   LEUKOCYTESUR LARGE (A) 09/28/2020 1221    Radiological Exams on Admission: DG Chest Port 1 View  Result Date: 12/11/2020 CLINICAL DATA:  80 year old female with shortness of breath. EXAM: PORTABLE CHEST 1 VIEW COMPARISON:  09/28/2020 FINDINGS: The heart size and mediastinal contours are within normal limits. Right juxtaphrenic peak sign. Blunting of the right costophrenic angle. Similar appearing lingular cicatricial atelectasis. Left basilar subsegmental atelectasis. No pneumothorax. The visualized skeletal structures are unremarkable. IMPRESSION: Right middle lobar atelectasis. Left basilar subsegmental atelectasis. Consider chest CT for further characterization in the appropriate clinical setting. Electronically Signed   By: Ruthann Cancer MD   On: 12/11/2020 09:19     Assessment/Plan Principal Problem:   Hypertensive emergency Active Problems:   CAD (coronary artery disease)   Chronic deep vein thrombosis (DVT) (HCC)   Paroxysmal atrial fibrillation (H[facility])   Acute on chronic diastolic CHF (congestive heart failure) ([place])   Parkinson disease ([place])      Hypertensive emergency (POA) Patient presents for evaluation of worsening shortness of breath and her blood pressure on admission was 243/113 Review of records shows she is supposed to be on metoprolol 50 mg twice daily but daughter states it was discontinued 3 weeks ago by her primary care provider Patient is currently on a nitroglycerin drip Will resume metoprolol 50 mg twice daily Start patient on amlodipine 10 mg daily    Acute on chronic diastolic  dysfunction CHF Secondary to hypertensive emergency Patient presents for evaluation of worsening shortness of breath associated with two-pillow orthopnea and lower extremity swelling BNP is elevated Last 2D echocardiogram from 2021 showed an LVEF of 60 to 65% Patient on Lasix 40 mg IV twice daily Optimize blood pressure control     CAD (coronary artery disease) Continue aspirin, statins and metoprolol     Paroxysmal atrial fibrillation with RVR Continue amiodarone and metoprolol for rate control Continue apixaban as primary prophylaxis for an acute stroke        Parkinson disease ([place]) Continue carbidopa levodopa    Dementia Continue donepezil   GERD Continue Protonix    Sick sinus syndrome S/P pacemaker insertion Stable  DVT prophylaxis: Apixaban Code Status: DO NOT RESUSCITATE Family Communication: Greater than 50% of time was spent discussing patient's condition and plan of care with her at the bedside and bring her daughter and healthcare power of attorney [name] over the phone.  All questions and concerns have been addressed.  They verbalized understanding and agreed with the plan.  CODE STATUS was discussed and patient wishes to be placed on a DO NOT RESUSCITATE status.  Disposition Plan: Back to previous home environment Consults called: Cardiology Status: At the time of admission, it appears that the appropriate admission status for this patient is inpatient.  This is judged to be reasonable and necessary in order to provide the required intensity of service to ensure the patient's safety given the presenting symptoms, physical exam findings, and initial radiographic and lab data in the context of their comorbid conditions. Patient requires inpatient status due to high intensity of service, high risk for further deterioration and high frequency of surveillance required. I certify that at the point of admission it is my clinical judgment that the  patient will  require inpatient hospital care spanning beyond 2 midnights.    Collier Bullock MD [facility]     12/11/2020, 10:47 AM

## 2020-12-11 NOTE — ED Notes (Signed)
X-ray at bedside

## 2020-12-11 NOTE — ED Triage Notes (Signed)
Pt arrives via EMS from home after having increasing SHOB for the past 6 days- pt denies hx of COPD and asthma- pt has hypertension and is unsure if she has been taking her medication- denies chest pain- pt has a pacemaker that EMS states has been firing

## 2020-12-12 ENCOUNTER — Inpatient Hospital Stay: Admit: 2020-12-12 | Payer: Medicare Other

## 2020-12-12 DIAGNOSIS — I161 Hypertensive emergency: Secondary | ICD-10-CM | POA: Diagnosis not present

## 2020-12-12 LAB — BASIC METABOLIC PANEL
Anion gap: 9 (ref 5–15)
BUN: 25 mg/dL — ABNORMAL HIGH (ref 8–23)
CO2: 25 mmol/L (ref 22–32)
Calcium: 9.4 mg/dL (ref 8.9–10.3)
Chloride: 103 mmol/L (ref 98–111)
Creatinine, Ser: 1.08 mg/dL — ABNORMAL HIGH (ref 0.44–1.00)
GFR, Estimated: 52 mL/min — ABNORMAL LOW (ref >60)
Glucose, Bld: 128 mg/dL — ABNORMAL HIGH (ref 70–99)
Potassium: 3.9 mmol/L (ref 3.5–5.1)
Sodium: 137 mmol/L (ref 135–145)

## 2020-12-12 LAB — GLUCOSE, CAPILLARY: Glucose-Capillary: 207 mg/dL — ABNORMAL HIGH (ref 70–99)

## 2020-12-12 LAB — BRAIN NATRIURETIC PEPTIDE: B Natriuretic Peptide: 1206.2 pg/mL — ABNORMAL HIGH (ref 0.0–100.0)

## 2020-12-12 MED ORDER — AMLODIPINE BESYLATE 5 MG PO TABS
5.0000 mg | ORAL_TABLET | Freq: Every day | ORAL | Status: DC
  Administered 2020-12-13: 5 mg via ORAL
  Filled 2020-12-12: qty 1

## 2020-12-12 NOTE — Evaluation (Signed)
Physical Therapy Evaluation Patient Details Name: Melinda Maxwell MRN: [id number] DOB: Jan 26, 1941 Today's Date: 12/12/2020   History of Present Illness  presented to ER secondary to progressive SOB; admitted for management of hypertensive emergency, acute/chronic CHF exacerbation.  Clinical Impression  Upon evaluation, patient alert and oriented to basic information; follows commands and demonstrates good effort with mobility.  Eager for OOB attempts as appropriate.  Bilat UE/LE strength and ROM grossly symmetrical and WFL; no focal weakness appreciated.  Currently requiring min/mod assist for bed mobility (due to mattress surface); min assist for sit/stand, basic transfers and short-distance gait (5') from bed/chair.  Demonstrates forward flexed posture, generally unsteady requiring UE support and +1 throughout.  Anticipate improved performance with use of RW; will assess additional gait performance next session. Vitals stable and WFL; additional distance deferred due ot lunch arriving in room Would benefit from skilled PT to address above deficits and promote optimal return to PLOF.; Recommend transition to HHPT upon discharge from acute hospitalization.     Follow Up Recommendations Home health PT    Equipment Recommendations   (has necessary equipment)    Recommendations for Other Services       Precautions / Restrictions Precautions Precautions: Fall Restrictions Weight Bearing Restrictions: No      Mobility  Bed Mobility Overal bed mobility: Needs Assistance Bed Mobility: Supine to Sit     Supine to sit: Min assist;Mod assist     General bed mobility comments: difficulty with bed mobility due to air-mattress surface    Transfers Overall transfer level: Needs assistance Equipment used: 1 person hand held assist             General transfer comment: forward flexed posture, limited balance; requires UE support throughout  Ambulation/Gait Ambulation/Gait  assistance: Min assist Gait Distance (Feet): 5 Feet Assistive device: 1 person hand held assist       General Gait Details: forward flexed posture, generally unsteady requiring UE support throughout.  Vitals stable and WFL; additional distance deferred due ot lunch arriving in room  Stairs            Wheelchair Mobility    Modified Rankin (Stroke Patients Only)       Balance Overall balance assessment: Needs assistance Sitting-balance support: No upper extremity supported;Feet supported Sitting balance-Leahy Scale: Good     Standing balance support: Bilateral upper extremity supported Standing balance-Leahy Scale: Poor                               Pertinent Vitals/Pain Pain Assessment: No/denies pain    Home Living Family/patient expects to be discharged to:: Private residence Living Arrangements: Children Available Help at Discharge: Family;Available PRN/intermittently;Personal care attendant Type of Home: House Home Access: Stairs to enter Entrance Stairs-Rails: Right Entrance Stairs-Number of Steps: 2 Home Layout: One level Home Equipment: Walker - 2 wheels;Shower seat      Prior Function Level of Independence: Independent with assistive device(s)         Comments: At baseline, ambulatory with RW for ADLs, household mobilization; assist from daughter for iADLs and household responsibilities as needed.  Lives with daughter ([place] outside of the home).     Hand Dominance   Dominant Hand: Right    Extremity/Trunk Assessment   Upper Extremity Assessment Upper Extremity Assessment: Overall WFL for tasks assessed    Lower Extremity Assessment Lower Extremity Assessment: Overall WFL for tasks assessed (grossly at least 4/5 throughout)  Cervical / Trunk Assessment Cervical / Trunk Assessment:  (thoracic kyphosis with scoliotic curvature towards the R)  Communication   Communication: No difficulties  Cognition Arousal/Alertness:  Awake/alert Behavior During Therapy: WFL for tasks assessed/performed Overall Cognitive Status: Within Functional Limits for tasks assessed                                        General Comments      Exercises     Assessment/Plan    PT Assessment Patient needs continued PT services  PT Problem List Decreased strength;Decreased range of motion;Decreased activity tolerance;Decreased balance;Decreased mobility;Decreased coordination;Decreased knowledge of use of DME;Decreased safety awareness;Decreased knowledge of precautions;Cardiopulmonary status limiting activity       PT Treatment Interventions DME instruction;Gait training;Stair training;Functional mobility training;Therapeutic activities;Therapeutic exercise;Balance training;Patient/family education    PT Goals (Current goals can be found in the Care Plan section)  Acute Rehab PT Goals Patient Stated Goal: to return home PT Goal Formulation: With patient Time For Goal Achievement: 12/26/20 Potential to Achieve Goals: Good    Frequency Min 2X/week   Barriers to discharge        Co-evaluation               AM-PAC PT "6 Clicks" Mobility  Outcome Measure Help needed turning from your back to your side while in a flat bed without using bedrails?: A Little Help needed moving from lying on your back to sitting on the side of a flat bed without using bedrails?: A Lot Help needed moving to and from a bed to a chair (including a wheelchair)?: A Little Help needed standing up from a chair using your arms (e.g., wheelchair or bedside chair)?: A Little Help needed to walk in hospital room?: A Little Help needed climbing 3-5 steps with a railing? : A Lot 6 Click Score: 16    End of Session Equipment Utilized During Treatment: Gait belt Activity Tolerance: Patient tolerated treatment well Patient left: in chair;with call bell/phone within reach Nurse Communication: Mobility status PT Visit Diagnosis:  Difficulty in walking, not elsewhere classified (R26.2)    Time: 1610-9604 PT Time Calculation (min) (ACUTE ONLY): 22 min   Charges:   PT Evaluation $PT Eval Moderate Complexity: 1 Mod          Kristen H. Owens Shark, PT, DPT, NCS 12/12/20, 9:14 PM [phone]

## 2020-12-12 NOTE — TOC Progression Note (Signed)
Transition of Care [facility]) - Progression Note    Patient Details  Name: Melinda Maxwell MRN: [id number] Date of Birth: 30-Jan-1941  Transition of Care [facility]) CM/SW [place], [place] Phone Number: [phone] 12/12/2020, 3:15 PM  Clinical Narrative:     CSW went to speak with patient.  Patient stated she was very dizzy and not feeling well.  CSW found ICU RN and updated her.  CSW will come back later when patient is feeling better.  Expected Discharge Plan: [place] Barriers to Discharge: Continued Medical Work up,Family Issues  Expected Discharge Plan and Services Expected Discharge Plan: [place] In-house Referral: Clinical Social Work   Post Acute Care Choice: [place] Living arrangements for the past 2 months: Single Family Home                                       Social Determinants of Health (SDOH) Interventions    Readmission Risk Interventions No flowsheet data found.

## 2020-12-12 NOTE — Progress Notes (Signed)
[facility] Cardiology    SUBJECTIVE: The patient reports feeling better this morning. She denies chest pain or shortness of breath. She reports occasional palpitations.    Vitals:   12/12/20 0200 12/12/20 0300 12/12/20 0400 12/12/20 0500  BP: (!) 145/84 (!) 144/75 (!) 152/82 (!) 152/76  Pulse: (!) 57 (!) 58 (!) 59 60  Resp: 15 17 16 19   Temp:   98.5 F (36.9 C)   TempSrc:   Oral   SpO2: 96% 99% 98% 97%  Weight:      Height:         Intake/Output Summary (Last 24 hours) at 12/12/2020 0915 Last data filed at 12/12/2020 0600 Gross per 24 hour  Intake 466.67 ml  Output 2500 ml  Net -2033.33 ml      PHYSICAL EXAM  General: Well developed, well nourished, elderly female, sitting up in bed eating breakfast in no acute distress HEENT:  Normocephalic and atramatic Neck:  No JVD.  Lungs: Clear bilaterally to auscultation on RA Heart: HRRR . Normal S1 and S2 with 2/6 systolic murmur  Msk: no obvious deformities. Neuro: Alert and oriented X 3. Psych:  Good affect, responds appropriately   LABS: Basic Metabolic Panel: Recent Labs    12/11/20 0828 12/12/20 0617  NA 141 137  K 3.7 3.9  CL 107 103  CO2 21* 25  GLUCOSE 116* 128*  BUN 16 25*  CREATININE 1.10* 1.08*  CALCIUM 9.7 9.4   Liver Function Tests: Recent Labs    12/11/20 0828  AST 23  ALT 15  ALKPHOS 95  BILITOT 1.0  PROT 7.3  ALBUMIN 3.6   No results for input(s): LIPASE, AMYLASE in the last 72 hours. CBC: Recent Labs    12/11/20 0828  WBC 7.0  NEUTROABS 5.6  HGB 9.8*  HCT 31.7*  MCV 91.6  PLT 397   Cardiac Enzymes: No results for input(s): CKTOTAL, CKMB, CKMBINDEX, TROPONINI in the last 72 hours. BNP: Invalid input(s): POCBNP D-Dimer: No results for input(s): DDIMER in the last 72 hours. Hemoglobin A1C: No results for input(s): HGBA1C in the last 72 hours. Fasting Lipid Panel: No results for input(s): CHOL, HDL, LDLCALC, TRIG, CHOLHDL, LDLDIRECT in the last 72 hours. Thyroid Function Tests: No  results for input(s): TSH, T4TOTAL, T3FREE, THYROIDAB in the last 72 hours.  Invalid input(s): FREET3 Anemia Panel: No results for input(s): VITAMINB12, FOLATE, FERRITIN, TIBC, IRON, RETICCTPCT in the last 72 hours.  CT CHEST WO CONTRAST  Result Date: 12/11/2020 CLINICAL DATA:  Shortness of breath EXAM: CT CHEST WITHOUT CONTRAST TECHNIQUE: Multidetector CT imaging of the chest was performed following the standard protocol without IV contrast. COMPARISON:  January 21, 2016 FINDINGS: Cardiovascular: Cardiomegaly. Three-vessel coronary artery atherosclerotic calcifications. Atherosclerotic calcifications throughout the aorta. No pericardial effusion. Mediastinum/Nodes: Thyroid is unremarkable. No axillary adenopathy. Mildly enlarged mediastinal lymph nodes. Pretracheal lymph node measures 13 mm in the short axis, previously 11 mm (series 2, image 54). Pretracheal lymph node measures 13 mm in the short axis, previously 10 mm (series 2, image 45). Lungs/Pleura: Moderate RIGHT greater than LEFT pleural effusion. Interlobular septal thickening. Centrilobular ground-glass opacities in an upper lobe predominant distribution. Bronchial wall thickening. Bibasilar consolidative opacities, likely atelectasis. Upper Abdomen: No acute abnormality. Musculoskeletal: Coarsely calcified LEFT lumpectomy bed. Dextroscoliosis of the thoracolumbar junction. IMPRESSION: 1. Constellation of findings are favored to reflect pulmonary edema with moderate RIGHT greater than LEFT pleural effusions and bibasilar atelectasis. Atypical infection could present similarly. 2. Mildly enlarged mediastinal lymph nodes, likely reactive. Aortic  Atherosclerosis (ICD10-I70.0). Electronically Signed   By: Valentino Saxon MD   On: 12/11/2020 10:59   DG Chest Port 1 View  Result Date: 12/11/2020 CLINICAL DATA:  80 year old female with shortness of breath. EXAM: PORTABLE CHEST 1 VIEW COMPARISON:  09/28/2020 FINDINGS: The heart size and mediastinal  contours are within normal limits. Right juxtaphrenic peak sign. Blunting of the right costophrenic angle. Similar appearing lingular cicatricial atelectasis. Left basilar subsegmental atelectasis. No pneumothorax. The visualized skeletal structures are unremarkable. IMPRESSION: Right middle lobar atelectasis. Left basilar subsegmental atelectasis. Consider chest CT for further characterization in the appropriate clinical setting. Electronically Signed   By: Ruthann Cancer MD   On: 12/11/2020 09:19     Echo pending  TELEMETRY: paced rhythm, ectopy, rate 70 bpm  ASSESSMENT AND PLAN:  Principal Problem:   Hypertensive emergency Active Problems:   CAD (coronary artery disease)   Chronic deep vein thrombosis (DVT) (HCC)   Paroxysmal atrial fibrillation (HCC)   Acute on chronic diastolic CHF (congestive heart failure) ([place])   Parkinson disease ([place])    1.  Acute on chronic diastolic congestive heart failure, exacerbated by hypertensive emergency, improved after initial diuresis, BNP 1285 2.  Hypertensive emergency, with recent discontinuation of metoprolol tartrate, improved after nitroglycerin infusion, restarting metoprolol tartrate, with addition of amlodipine. Better this morning. 3.  Coronary artery disease, status post multiple coronary stents, most recently drug-eluting stents proximal and distal LAD 02/11/2018, only without chest pain 4.  Borderline elevated troponin, (24->24-> 29-> 31-> 36) in the absence of chest pain, likely secondary to demand supply ischemia, in the setting of hypertensive emergency and acute on chronic diastolic congestive heart failure 5.  Paroxysmal atrial fibrillation, on Eliquis for stroke prevention and amiodarone for rhythm control 6.  Sick sinus syndrome, status post Micra AV leadless pacemaker 10/05/2020  Recommendations 1.  Agree with overall current therapy 2.  Continue diuresis while carefully monitoring renal status 3.  Continue amiodarone for rhythm  control; continue metoprolol tartrate 50 mg twice daily for rate control. Up-titrate metoprolol if needed for better blood pressure control 4.  Continue amlodipine 10 mg daily 5.  Review 2D echocardiogram   Clabe Seal, PA-C 12/12/2020 9:15 AM

## 2020-12-12 NOTE — Progress Notes (Signed)
PROGRESS NOTE    Melinda Maxwell  URK:[id number] DOB: 1941/08/12 DOA: 12/11/2020 PCP: [name], MD    Brief Narrative:   HPI: Melinda Maxwell is an 80 y.o. female with medical history significant for  CAD, chronic diastolic CHF, paroxysmal A. Fib/DVT on Xarelto, Parkinson's disease, chronic dizziness and physical deconditioning, HTN who presents to the emergency room by EMS for evaluation of worsening shortness of breath over the last 5 days.  Patient states that initially she was short of breath with exertion but now she is short of breath at rest.  This is associated with bilateral lower extremity swelling, two-pillow orthopnea as well as expiratory wheezes.  3/7-blood pressure improved.  Good urine output.  Consultants:   Cardiology  Procedures:   Antimicrobials:       Subjective: Feeling better.  Less short of breath.  Has not laid flat to see if she becomes short of breath.  Objective: Vitals:   12/12/20 0200 12/12/20 0300 12/12/20 0400 12/12/20 0500  BP: (!) 145/84 (!) 144/75 (!) 152/82 (!) 152/76  Pulse: (!) 57 (!) 58 (!) 59 60  Resp: 15 17 16 19   Temp:   98.5 F (36.9 C)   TempSrc:   Oral   SpO2: 96% 99% 98% 97%  Weight:      Height:        Intake/Output Summary (Last 24 hours) at 12/12/2020 0853 Last data filed at 12/12/2020 0600 Gross per 24 hour  Intake 466.67 ml  Output 2500 ml  Net -2033.33 ml   Filed Weights   12/11/20 0823 12/11/20 1052  Weight: 56.2 kg 58.1 kg    Examination:  General exam: Appears calm and comfortable  Respiratory system: Decreased breath sounds. Respiratory effort normal. Cardiovascular system: S1 & S2 heard, RRR. +JVD, no gallops  Gastrointestinal system: Abdomen is nondistended, soft and nontender. Normal bowel sounds heard. Central nervous system: Alert and oriented. No focal neurological deficits. Extremities: Trace lower extremity edema Skin: Warm dry Psychiatry: Judgement and insight appear normal. Mood &  affect appropriate.     Data Reviewed: I have personally reviewed following labs and imaging studies  CBC: Recent Labs  Lab 12/11/20 0828  WBC 7.0  NEUTROABS 5.6  HGB 9.8*  HCT 31.7*  MCV 91.6  PLT 831   Basic Metabolic Panel: Recent Labs  Lab 12/11/20 0828 12/12/20 0617  NA 141 137  K 3.7 3.9  CL 107 103  CO2 21* 25  GLUCOSE 116* 128*  BUN 16 25*  CREATININE 1.10* 1.08*  CALCIUM 9.7 9.4   GFR: Estimated Creatinine Clearance: 33.5 mL/min (A) (by C-G formula based on SCr of 1.08 mg/dL (H)). Liver Function Tests: Recent Labs  Lab 12/11/20 0828  AST 23  ALT 15  ALKPHOS 95  BILITOT 1.0  PROT 7.3  ALBUMIN 3.6   No results for input(s): LIPASE, AMYLASE in the last 168 hours. No results for input(s): AMMONIA in the last 168 hours. Coagulation Profile: No results for input(s): INR, PROTIME in the last 168 hours. Cardiac Enzymes: No results for input(s): CKTOTAL, CKMB, CKMBINDEX, TROPONINI in the last 168 hours. BNP (last 3 results) No results for input(s): PROBNP in the last 8760 hours. HbA1C: No results for input(s): HGBA1C in the last 72 hours. CBG: Recent Labs  Lab 12/11/20 1051  GLUCAP 117*   Lipid Profile: No results for input(s): CHOL, HDL, LDLCALC, TRIG, CHOLHDL, LDLDIRECT in the last 72 hours. Thyroid Function Tests: No results for input(s): TSH, T4TOTAL, FREET4, T3FREE,  THYROIDAB in the last 72 hours. Anemia Panel: No results for input(s): VITAMINB12, FOLATE, FERRITIN, TIBC, IRON, RETICCTPCT in the last 72 hours. Sepsis Labs: No results for input(s): PROCALCITON, LATICACIDVEN in the last 168 hours.  Recent Results (from the past 240 hour(s))  Resp Panel by RT-PCR (Flu A&B, Covid) Nasopharyngeal Swab     Status: None   Collection Time: 12/11/20  9:03 AM   Specimen: Nasopharyngeal Swab; Nasopharyngeal(NP) swabs in vial transport medium  Result Value Ref Range Status   SARS Coronavirus 2 by RT PCR NEGATIVE NEGATIVE Final    Comment:  (NOTE) SARS-CoV-2 target nucleic acids are NOT DETECTED.  The SARS-CoV-2 RNA is generally detectable in upper respiratory specimens during the acute phase of infection. The lowest concentration of SARS-CoV-2 viral copies this assay can detect is 138 copies/mL. A negative result does not preclude SARS-Cov-2 infection and should not be used as the sole basis for treatment or other patient management decisions. A negative result may occur with  improper specimen collection/handling, submission of specimen other than nasopharyngeal swab, presence of viral mutation(s) within the areas targeted by this assay, and inadequate number of viral copies(<138 copies/mL). A negative result must be combined with clinical observations, patient history, and epidemiological information. The expected result is Negative.  Fact Sheet for Patients:  EntrepreneurPulse.com.au  Fact Sheet for Healthcare Providers:  IncredibleEmployment.be  This test is no t yet approved or cleared by the [place] FDA and  has been authorized for detection and/or diagnosis of SARS-CoV-2 by FDA under an Emergency Use Authorization (EUA). This EUA will remain  in effect (meaning this test can be used) for the duration of the COVID-19 declaration under Section 564(b)(1) of the Act, 21 U.S.C.section 360bbb-3(b)(1), unless the authorization is terminated  or revoked sooner.       Influenza A by PCR NEGATIVE NEGATIVE Final   Influenza B by PCR NEGATIVE NEGATIVE Final    Comment: (NOTE) The Xpert Xpress SARS-CoV-2/FLU/RSV plus assay is intended as an aid in the diagnosis of influenza from Nasopharyngeal swab specimens and should not be used as a sole basis for treatment. Nasal washings and aspirates are unacceptable for Xpert Xpress SARS-CoV-2/FLU/RSV testing.  Fact Sheet for Patients: EntrepreneurPulse.com.au  Fact Sheet for Healthcare  Providers: IncredibleEmployment.be  This test is not yet approved or cleared by the [place] FDA and has been authorized for detection and/or diagnosis of SARS-CoV-2 by FDA under an Emergency Use Authorization (EUA). This EUA will remain in effect (meaning this test can be used) for the duration of the COVID-19 declaration under Section 564(b)(1) of the Act, 21 U.S.C. section 360bbb-3(b)(1), unless the authorization is terminated or revoked.  Performed at [facility], [address] [place], [place] [address]   MRSA PCR Screening     Status: None   Collection Time: 12/11/20 10:59 AM   Specimen: Nasal Mucosa; Nasopharyngeal  Result Value Ref Range Status   MRSA by PCR NEGATIVE NEGATIVE Final    Comment:        The GeneXpert MRSA Assay (FDA approved for NASAL specimens only), is one component of a comprehensive MRSA colonization surveillance program. It is not intended to diagnose MRSA infection nor to guide or monitor treatment for MRSA infections. Performed at [facility], [address] [place], [place] [address]          Radiology Studies: CT CHEST WO CONTRAST  Result Date: 12/11/2020 CLINICAL DATA:  Shortness of breath EXAM: CT CHEST WITHOUT CONTRAST TECHNIQUE: Multidetector CT  imaging of the chest was performed following the standard protocol without IV contrast. COMPARISON:  January 21, 2016 FINDINGS: Cardiovascular: Cardiomegaly. Three-vessel coronary artery atherosclerotic calcifications. Atherosclerotic calcifications throughout the aorta. No pericardial effusion. Mediastinum/Nodes: Thyroid is unremarkable. No axillary adenopathy. Mildly enlarged mediastinal lymph nodes. Pretracheal lymph node measures 13 mm in the short axis, previously 11 mm (series 2, image 54). Pretracheal lymph node measures 13 mm in the short axis, previously 10 mm (series 2, image 45). Lungs/Pleura: Moderate RIGHT greater than LEFT pleural effusion.  Interlobular septal thickening. Centrilobular ground-glass opacities in an upper lobe predominant distribution. Bronchial wall thickening. Bibasilar consolidative opacities, likely atelectasis. Upper Abdomen: No acute abnormality. Musculoskeletal: Coarsely calcified LEFT lumpectomy bed. Dextroscoliosis of the thoracolumbar junction. IMPRESSION: 1. Constellation of findings are favored to reflect pulmonary edema with moderate RIGHT greater than LEFT pleural effusions and bibasilar atelectasis. Atypical infection could present similarly. 2. Mildly enlarged mediastinal lymph nodes, likely reactive. Aortic Atherosclerosis (ICD10-I70.0). Electronically Signed   By: Valentino Saxon MD   On: 12/11/2020 10:59   DG Chest Port 1 View  Result Date: 12/11/2020 CLINICAL DATA:  80 year old female with shortness of breath. EXAM: PORTABLE CHEST 1 VIEW COMPARISON:  09/28/2020 FINDINGS: The heart size and mediastinal contours are within normal limits. Right juxtaphrenic peak sign. Blunting of the right costophrenic angle. Similar appearing lingular cicatricial atelectasis. Left basilar subsegmental atelectasis. No pneumothorax. The visualized skeletal structures are unremarkable. IMPRESSION: Right middle lobar atelectasis. Left basilar subsegmental atelectasis. Consider chest CT for further characterization in the appropriate clinical setting. Electronically Signed   By: Ruthann Cancer MD   On: 12/11/2020 09:19        Scheduled Meds: . allopurinol  300 mg Oral Daily  . amiodarone  200 mg Oral BID  . amLODipine  10 mg Oral Daily  . apixaban  2.5 mg Oral BID  . aspirin  81 mg Oral Daily  . carbidopa-levodopa  2 tablet Oral TID with meals  . Carbidopa-Levodopa ER  1 tablet Oral QHS  . Chlorhexidine Gluconate Cloth  6 each Topical Q0600  . cholecalciferol  2,000 Units Oral Daily  . donepezil  10 mg Oral QHS  . feeding supplement  237 mL Oral BID BM  . furosemide  40 mg Intravenous BID  . ipratropium-albuterol  3  mL Nebulization Q6H  . magnesium oxide  400 mg Oral Daily  . mouth rinse  15 mL Mouth Rinse BID  . metoprolol tartrate  50 mg Oral BID  . multivitamin with minerals  1 tablet Oral Daily  . pantoprazole  40 mg Oral Daily  . senna-docusate  2 tablet Oral BID  . simvastatin  10 mg Oral Daily  . sodium chloride flush  3 mL Intravenous Q12H   Continuous Infusions: . sodium chloride      Assessment & Plan:   Principal Problem:   Hypertensive emergency Active Problems:   CAD (coronary artery disease)   Chronic deep vein thrombosis (DVT) (HCC)   Paroxysmal atrial fibrillation (HCC)   Acute on chronic diastolic CHF (congestive heart failure) (HCC)   Parkinson disease ([place])   Hypertensive emergency (POA)-present on admission Patient presents for evaluation of worsening shortness of breath and her blood pressure on admission was 243/113 Review of records shows she is supposed to be on metoprolol 50 mg twice daily but daughter stated it was discontinued 3 weeks ago by her primary care provider Patient started on nitro drip  Metoprolol was resumed Started on amlodipine 10mg  qd bp on low  side at times, will decrease amlodipine to 5mg  qd     Acute on chronic diastolic dysfunction CHF Secondary to hypertensive emergency BNP elevated Echo from 2020 showed LVEF 60 to 65% Cardiology was consulted Continue Lasix 40 mg IV twice daily Monitor renal status Follow-up echocardiogram Optimize blood pressure   Elevated troponin-mildly Likely due to demand ischemia No chest pain Likely in the setting of hypertensive emergency and acute on chronic diastolic heart failure Follow-up echocardiogram Cardiology following   CAD (coronary artery disease) Continue aspirin, statin, metoprolol      Paroxysmal atrial fibrillation with RVR Heart rate controlled  Continue amiodarone and metoprolol  Continue Eliquis for primary prophylaxis for stroke prevention     Parkinson  disease ([place]) Continue carbidopa levodopa   Dementia Continue donepezil   GERD Continue Protonix    Sick sinus syndrome S/P pacemaker insertion Stable   DVT prophylaxis: Eliquis Code Status: DNR Family Communication: Sister at bedside  Status is: Inpatient  Remains inpatient appropriate because:IV treatments appropriate due to intensity of illness or inability to take PO   Dispo: The patient is from: [place]              Anticipated d/c is to: Home              Patient currently is not medically stable to d/c.   Difficult to place patient No                    Anticipated dc date: 2 days           LOS: 1 day   Time spent: 35 minutes with more than 50% on [place], MD [facility] Pager [phone]  If 7PM-7AM, please contact night-coverage 12/12/2020, 8:53 AM

## 2020-12-12 NOTE — TOC Initial Note (Signed)
Transition of Care [facility]) - Initial/Assessment Note    Patient Details  Name: Melinda Maxwell MRN: [id number] Date of Birth: Apr 18, 1941  Transition of Care [facility]) CM/SW Contact:    Ova Freshwater Phone Number: [phone] 12/12/2020, 1:46 PM  Clinical Narrative:                  Patient presents to [facility] due to SOB and increased BP.  Patient lives with her Melinda, Maxwell (Daughter) [phone] who is her primary care giver. CSW contacted Ms. Melinda Maxwell for collateral information and left her a voicemail requesting a return call.  Expected Discharge Plan: Skilled Nursing Facility Barriers to Discharge: Continued Medical Work up,Family Issues   Patient Goals and CMS Choice        Expected Discharge Plan and Services Expected Discharge Plan: [place] In-house Referral: Clinical Social Work   Post Acute Care Choice: [place] Living arrangements for the past 2 months: [place]                                      Prior Living Arrangements/Services Living arrangements for the past 2 months: Single Family Home Lives with:: Adult Children Melinda Maxwell, Melinda Maxwell (Daughter)   [phone]) Patient language and need for interpreter reviewed:: No Do you feel safe going back to the place where you live?: Yes      Need for Family Participation in Patient Care: Yes (Comment) Care giver support system in place?: Yes (comment)   Criminal Activity/Legal Involvement Pertinent to Current Situation/Hospitalization: No - Comment as needed  Activities of Daily Living Home Assistive Devices/Equipment: Gilford Rile (specify type) ADL Screening (condition at time of admission) Patient's cognitive ability adequate to safely complete daily activities?: No Is the patient deaf or have difficulty hearing?: No Does the patient have difficulty seeing, even when wearing glasses/contacts?: Yes Does the patient have difficulty concentrating, remembering, or making  decisions?: Yes Patient able to express need for assistance with ADLs?: Yes Does the patient have difficulty dressing or bathing?: Yes Independently performs ADLs?: No (daughter helps/ and they have someone coming in) Does the patient have difficulty walking or climbing stairs?: No Weakness of Legs: Right Weakness of Arms/Hands: None  Permission Sought/Granted Permission sought to share information with : Facility Sport and exercise psychologist Permission granted to share information with : Yes, Verbal Permission Granted  Share Information with NAME: Rukiya, [place] (Daughter)   [phone]           Emotional Assessment Appearance:: Appears older than stated age Attitude/Demeanor/Rapport: Self-Confident Affect (typically observed): Stable Orientation: : Oriented to Self,Oriented to Place,Oriented to Situation Alcohol / Substance Use: Not Applicable Psych Involvement: No (comment)  Admission diagnosis:  Acute pulmonary edema (HCC) [J81.0] SOB (shortness of breath) [R06.02] Hypoxia [R09.02] Mild persistent asthma with exacerbation [J45.31] Hypertensive emergency [I16.1] Acute on chronic congestive heart failure, unspecified heart failure type [facility]) [I50.9] Patient Active Problem List   Diagnosis Date Noted  . Hypertensive emergency 12/11/2020  . Sick sinus syndrome ([place])   . Hypokalemia 09/29/2020  . Type 2 diabetes mellitus without complication ([place]) 93/81/0175  . Chronic anticoagulation 09/29/2020  . Parkinson disease ([place]) 09/29/2020  . Subdural hematoma ([place]) 07/16/2019  . Closed displaced fracture of surgical neck of left humerus 07/16/2019  . Non-sustained ventricular tachycardia ([place]) 07/16/2019  . Acute on chronic diastolic CHF (congestive heart failure) ([place]) 07/16/2019  . CKD (chronic kidney disease), stage III ([place]) 07/16/2019  .  Syncope and collapse   . Fall   . CAD (coronary artery disease) 03/26/2018  . Generalized weakness 03/26/2018  . Paroxysmal atrial  fibrillation ([place]) 02/27/2018  . UTI (urinary tract infection) 02/18/2018  . Stable angina ([place]) 01/15/2018  . Hyperlipidemia, mixed 09/10/2014  . Moderate tricuspid insufficiency 09/10/2014  . Chronic deep vein thrombosis (DVT) (HCC) 08/10/2014  . Diabetes mellitus with stage 3 chronic kidney disease ([place]) 07/09/2014  . Benign essential hypertension 02/21/2014  . Gout 02/21/2014  . OSA (obstructive sleep apnea) 02/21/2014   PCP:  [name], MD Pharmacy:   [id number] - [place], [place] - [address] [address] Phone: [phone] Fax: [phone]     Social Determinants of Health (SDOH) Interventions    Readmission Risk Interventions No flowsheet data found.

## 2020-12-12 NOTE — Plan of Care (Signed)
Plan of care reviewed with pt.

## 2020-12-13 ENCOUNTER — Inpatient Hospital Stay
Admit: 2020-12-13 | Discharge: 2020-12-13 | Disposition: A | Discharge status: Home / Self Care | Payer: Medicare Other | Attending: Internal Medicine | Admitting: Internal Medicine

## 2020-12-13 DIAGNOSIS — I161 Hypertensive emergency: Secondary | ICD-10-CM | POA: Diagnosis not present

## 2020-12-13 LAB — ECHOCARDIOGRAM COMPLETE
AR max vel: 1.85 cm2
AV Area VTI: 1.72 cm2
AV Area mean vel: 1.58 cm2
AV Mean grad: 8.5 mmHg
AV Peak grad: 13.8 mmHg
Ao pk vel: 1.86 m/s
Area-P 1/2: 4.86 cm2
Height: 61 in
MV VTI: 2.04 cm2
S' Lateral: 2.5 cm
Weight: 1971.79 oz

## 2020-12-13 LAB — BASIC METABOLIC PANEL
Anion gap: 11 (ref 5–15)
BUN: 40 mg/dL — ABNORMAL HIGH (ref 8–23)
CO2: 27 mmol/L (ref 22–32)
Calcium: 9.4 mg/dL (ref 8.9–10.3)
Chloride: 100 mmol/L (ref 98–111)
Creatinine, Ser: 1.23 mg/dL — ABNORMAL HIGH (ref 0.44–1.00)
GFR, Estimated: 44 mL/min — ABNORMAL LOW (ref >60)
Glucose, Bld: 94 mg/dL (ref 70–99)
Potassium: 3.9 mmol/L (ref 3.5–5.1)
Sodium: 138 mmol/L (ref 135–145)

## 2020-12-13 MED ORDER — IPRATROPIUM-ALBUTEROL 0.5-2.5 (3) MG/3ML IN SOLN
3.0000 mL | Freq: Three times a day (TID) | RESPIRATORY_TRACT | Status: DC
  Administered 2020-12-14: 3 mL via RESPIRATORY_TRACT
  Filled 2020-12-13: qty 3

## 2020-12-13 NOTE — Progress Notes (Signed)
[facility] Cardiology    SUBJECTIVE: The patient reports feeling much better today, including her breathing. She did have symptomatic hypotension yesterday with dizziness. She also reports dizziness with head turning. She denies chest pain. She denies palpitations.    Vitals:   12/13/20 0500 12/13/20 0600 12/13/20 0700 12/13/20 0730  BP: (!) 168/88 (!) 154/114 (!) 152/67   Pulse: 77 76 63   Resp: (!) 21 17 17    Temp:    98.2 F (36.8 C)  TempSrc:      SpO2: 98% 99% 97%   Weight: 55.9 kg     Height:         Intake/Output Summary (Last 24 hours) at 12/13/2020 0831 Last data filed at 12/13/2020 0500 Gross per 24 hour  Intake 1630 ml  Output 1925 ml  Net -295 ml      PHYSICAL EXAM  General: Well developed, well nourished, in no acute distress, sitting up in bed finishing breakfast, smiling HEENT:  Normocephalic and atramatic Neck: JVD Lungs: normal effort of breathing on supplemental O2 via ; mild left basilar crackles, no wheezing, speaking in full sentences. Heart: HRRR . Normal S1 and S2, 2/6 systolic murmur  Abdomen: Bowel sounds are positive, abdomen soft and non-tender  Msk:  Normal strength and tone for age. Extremities: No clubbing, cyanosis or edema.   Neuro: Alert and oriented X 3. Psych:  Good affect, responds appropriately   LABS: Basic Metabolic Panel: Recent Labs    12/12/20 0617 12/13/20 0450  NA 137 138  K 3.9 3.9  CL 103 100  CO2 25 27  GLUCOSE 128* 94  BUN 25* 40*  CREATININE 1.08* 1.23*  CALCIUM 9.4 9.4   Liver Function Tests: Recent Labs    12/11/20 0828  AST 23  ALT 15  ALKPHOS 95  BILITOT 1.0  PROT 7.3  ALBUMIN 3.6   No results for input(s): LIPASE, AMYLASE in the last 72 hours. CBC: Recent Labs    12/11/20 0828  WBC 7.0  NEUTROABS 5.6  HGB 9.8*  HCT 31.7*  MCV 91.6  PLT 397   Cardiac Enzymes: No results for input(s): CKTOTAL, CKMB, CKMBINDEX, TROPONINI in the last 72 hours. BNP: Invalid input(s): POCBNP D-Dimer: No  results for input(s): DDIMER in the last 72 hours. Hemoglobin A1C: No results for input(s): HGBA1C in the last 72 hours. Fasting Lipid Panel: No results for input(s): CHOL, HDL, LDLCALC, TRIG, CHOLHDL, LDLDIRECT in the last 72 hours. Thyroid Function Tests: No results for input(s): TSH, T4TOTAL, T3FREE, THYROIDAB in the last 72 hours.  Invalid input(s): FREET3 Anemia Panel: No results for input(s): VITAMINB12, FOLATE, FERRITIN, TIBC, IRON, RETICCTPCT in the last 72 hours.  CT CHEST WO CONTRAST  Result Date: 12/11/2020 CLINICAL DATA:  Shortness of breath EXAM: CT CHEST WITHOUT CONTRAST TECHNIQUE: Multidetector CT imaging of the chest was performed following the standard protocol without IV contrast. COMPARISON:  January 21, 2016 FINDINGS: Cardiovascular: Cardiomegaly. Three-vessel coronary artery atherosclerotic calcifications. Atherosclerotic calcifications throughout the aorta. No pericardial effusion. Mediastinum/Nodes: Thyroid is unremarkable. No axillary adenopathy. Mildly enlarged mediastinal lymph nodes. Pretracheal lymph node measures 13 mm in the short axis, previously 11 mm (series 2, image 54). Pretracheal lymph node measures 13 mm in the short axis, previously 10 mm (series 2, image 45). Lungs/Pleura: Moderate RIGHT greater than LEFT pleural effusion. Interlobular septal thickening. Centrilobular ground-glass opacities in an upper lobe predominant distribution. Bronchial wall thickening. Bibasilar consolidative opacities, likely atelectasis. Upper Abdomen: No acute abnormality. Musculoskeletal: Coarsely calcified LEFT lumpectomy bed.  Dextroscoliosis of the thoracolumbar junction. IMPRESSION: 1. Constellation of findings are favored to reflect pulmonary edema with moderate RIGHT greater than LEFT pleural effusions and bibasilar atelectasis. Atypical infection could present similarly. 2. Mildly enlarged mediastinal lymph nodes, likely reactive. Aortic Atherosclerosis (ICD10-I70.0). Electronically  Signed   By: Valentino Saxon MD   On: 12/11/2020 10:59   DG Chest Port 1 View  Result Date: 12/11/2020 CLINICAL DATA:  80 year old female with shortness of breath. EXAM: PORTABLE CHEST 1 VIEW COMPARISON:  09/28/2020 FINDINGS: The heart size and mediastinal contours are within normal limits. Right juxtaphrenic peak sign. Blunting of the right costophrenic angle. Similar appearing lingular cicatricial atelectasis. Left basilar subsegmental atelectasis. No pneumothorax. The visualized skeletal structures are unremarkable. IMPRESSION: Right middle lobar atelectasis. Left basilar subsegmental atelectasis. Consider chest CT for further characterization in the appropriate clinical setting. Electronically Signed   By: Ruthann Cancer MD   On: 12/11/2020 09:19     Echo pending  TELEMETRY: paced rhythm, 70s  ASSESSMENT AND PLAN:  Principal Problem:   Hypertensive emergency Active Problems:   CAD (coronary artery disease)   Chronic deep vein thrombosis (DVT) (HCC)   Paroxysmal atrial fibrillation (HCC)   Acute on chronic diastolic CHF (congestive heart failure) ([place])   Parkinson disease ([place])    1.Acute on chronic diastolic congestive heart failure, exacerbated by hypertensive emergency,improved after initial diuresis,BNP 1285 2.Hypertensive emergency, with recent discontinuation of metoprolol tartrate, improved afternitroglycerin infusion, restarting metoprolol tartrate, with addition of amlodipine, which was later discontinued due to dizziness and low blood pressure. Better this morning on metoprolol tartrate and IV Lasix. 3.Coronary artery disease, status post multiple coronary stents, most recently drug-eluting stents proximal and distal LAD 02/11/2018, currently without chest pain 4.Borderline elevated troponin, (24->24-> 29-> 31-> 36) in the absence of chest pain, likely secondary to demand supply ischemia, in the setting of hypertensive emergency and acute on chronic diastolic  congestive heart failure 5.Paroxysmal atrial fibrillation, on Eliquis for stroke prevention, metoprolol tartrate and amiodarone for rate and rhythm control 6.Sick sinus syndrome, status post Micra AVleadless pacemaker 10/05/2020  Recommendations: 1. Agree with current therapy 2. Continue diuresis while carefully monitoring renal status 3.Continue amiodarone for rhythm control; continue metoprolol tartrate 50 mg twice daily for rate control. 4. Hold amlodipine at this time due to symptomatic hypotension 5. Review 2D echocardiogram 6. Recommend transferring to step down floor today   Clabe Seal, PA-C 12/13/2020 8:31 AM

## 2020-12-13 NOTE — Progress Notes (Signed)
Physical Therapy Treatment Patient Details Name: Melinda Maxwell MRN: [id number] DOB: [birth year] Today's Date: 12/13/2020    History of Present Illness presented to ER secondary to progressive SOB; admitted for management of hypertensive emergency, acute/chronic CHF exacerbation.    PT Comments    Pt ready for session.  To EOB with min a x 1 and increased time.  Upon sitting she initially reports dizziness.  Time given to see if it resolved, she stands with RW at bedside with min/mod a x 1 briefly before  she c/o chest tightness.  Unable to rate number but states "enough to make me pay attention".  She is assisted to supine with max a x 1 and repositioned for comfort.  Chest tightness resolving with time.  RN notified.  Deferred further mobility or gait at this time.  Pt required outside assist to remain standing for balance.  She reports she is fearful.  Anticipate she would have needed min/mod a to walk this am.  Will leave recommendations at [facility] for now but anticipate she may need to transition to SNF if significant improvement in mobility is not achieved.  She lives with her daughter but she does work and she needs to be independent at home per pt.  Will progress gait next session as able.   Follow Up Recommendations  Home health PT - see above.     Equipment Recommendations   (has necessary equipment)    Recommendations for Other Services       Precautions / Restrictions Precautions Precautions: Fall Restrictions Weight Bearing Restrictions: No    Mobility  Bed Mobility Overal bed mobility: Needs Assistance Bed Mobility: Supine to Sit;Sit to Supine     Supine to sit: Min assist Sit to supine: Max assist   General bed mobility comments: difficulty with bed mobility due to air-mattress surface - full assist to return to supine due to C/o chest tightness    Transfers Overall transfer level: Needs assistance Equipment used: Rolling walker (2 wheeled) Transfers: Sit  to/from Stand Sit to Stand: Min assist            Ambulation/Gait         Gait velocity: deferred due to chest tightness       Stairs             Wheelchair Mobility    Modified Rankin (Stroke Patients Only)       Balance Overall balance assessment: Needs assistance Sitting-balance support: No upper extremity supported;Feet supported Sitting balance-Leahy Scale: Good     Standing balance support: Bilateral upper extremity supported Standing balance-Leahy Scale: Poor Standing balance comment: assist to remain standing                            Cognition Arousal/Alertness: Awake/alert Behavior During Therapy: WFL for tasks assessed/performed Overall Cognitive Status: Within Functional Limits for tasks assessed                                        Exercises      General Comments        Pertinent Vitals/Pain Pain Assessment: Faces Faces Pain Scale: Hurts little more Pain Location: chest tightness with standing Pain Descriptors / Indicators: Tightness Pain Intervention(s): Limited activity within patient's tolerance;Monitored during session;Repositioned;Other (comment)    Home Living  Prior Function            PT Goals (current goals can now be found in the care plan section) Progress towards PT goals: Progressing toward goals    Frequency    Min 2X/week      PT Plan      Co-evaluation              AM-PAC PT "6 Clicks" Mobility   Outcome Measure  Help needed turning from your back to your side while in a flat bed without using bedrails?: A Little Help needed moving from lying on your back to sitting on the side of a flat bed without using bedrails?: A Lot Help needed moving to and from a bed to a chair (including a wheelchair)?: A Little Help needed standing up from a chair using your arms (e.g., wheelchair or bedside chair)?: A Little Help needed to walk in  hospital room?: A Little Help needed climbing 3-5 steps with a railing? : A Lot 6 Click Score: 16    End of Session Equipment Utilized During Treatment: Gait belt Activity Tolerance: Patient tolerated treatment well Patient left: in chair;with call bell/phone within reach Nurse Communication: Mobility status PT Visit Diagnosis: Difficulty in walking, not elsewhere classified (R26.2)     Time: 5409-8119 PT Time Calculation (min) (ACUTE ONLY): 24 min  Charges:  $Therapeutic Activity: 23-37 mins                    Chesley Noon, PTA 12/13/20, 11:10 AM

## 2020-12-13 NOTE — Progress Notes (Signed)
*  PRELIMINARY RESULTS* Echocardiogram 2D Echocardiogram has been performed.  Melinda Maxwell 12/13/2020, 9:17 AM

## 2020-12-13 NOTE — Progress Notes (Signed)
PROGRESS NOTE    MALEKA CONTINO  STM:[id number] DOB: 1940/12/13 DOA: 12/11/2020 PCP: [name], MD    Brief Narrative:   HPI: Melinda Maxwell is an 80 y.o. female with medical history significant for  CAD, chronic diastolic CHF, paroxysmal A. Fib/DVT on Xarelto, Parkinson's disease, chronic dizziness and physical deconditioning, HTN who presents to the emergency room by EMS for evaluation of worsening shortness of breath over the last 5 days.  Patient states that initially she was short of breath with exertion but now she is short of breath at rest.  This is associated with bilateral lower extremity swelling, two-pillow orthopnea as well as expiratory wheezes.  3/8- bp stable. Improving clinically  Consultants:   Cardiology  Procedures:   Antimicrobials:       Subjective: Less short of breath than on admission.  Unable to tell me she still has orthopnea or not.  No chest pain.  She feels more comfortable sitting up in bed.  Objective: Vitals:   12/13/20 0500 12/13/20 0600 12/13/20 0700 12/13/20 0730  BP: (!) 168/88 (!) 154/114 (!) 152/67   Pulse: 77 76 63   Resp: (!) 21 17 17    Temp:    98.2 F (36.8 C)  TempSrc:      SpO2: 98% 99% 97%   Weight: 55.9 kg     Height:        Intake/Output Summary (Last 24 hours) at 12/13/2020 0828 Last data filed at 12/13/2020 0500 Gross per 24 hour  Intake 1630 ml  Output 1925 ml  Net -295 ml   Filed Weights   12/11/20 0823 12/11/20 1052 12/13/20 0500  Weight: 56.2 kg 58.1 kg 55.9 kg    Examination:  Calm, comfortable Decreased breath sounds at bases no wheezing Reg-irreg, s1/s2 no jvd Soft benign +bs Decrease LE edema Awake alert, grossly intact Mood and affect appropriate in current setting   Data Reviewed: I have personally reviewed following labs and imaging studies  CBC: Recent Labs  Lab 12/11/20 0828  WBC 7.0  NEUTROABS 5.6  HGB 9.8*  HCT 31.7*  MCV 91.6  PLT 892   Basic Metabolic Panel: Recent  Labs  Lab 12/11/20 0828 12/12/20 0617 12/13/20 0450  NA 141 137 138  K 3.7 3.9 3.9  CL 107 103 100  CO2 21* 25 27  GLUCOSE 116* 128* 94  BUN 16 25* 40*  CREATININE 1.10* 1.08* 1.23*  CALCIUM 9.7 9.4 9.4   GFR: Estimated Creatinine Clearance: 27.1 mL/min (A) (by C-G formula based on SCr of 1.23 mg/dL (H)). Liver Function Tests: Recent Labs  Lab 12/11/20 0828  AST 23  ALT 15  ALKPHOS 95  BILITOT 1.0  PROT 7.3  ALBUMIN 3.6   No results for input(s): LIPASE, AMYLASE in the last 168 hours. No results for input(s): AMMONIA in the last 168 hours. Coagulation Profile: No results for input(s): INR, PROTIME in the last 168 hours. Cardiac Enzymes: No results for input(s): CKTOTAL, CKMB, CKMBINDEX, TROPONINI in the last 168 hours. BNP (last 3 results) No results for input(s): PROBNP in the last 8760 hours. HbA1C: No results for input(s): HGBA1C in the last 72 hours. CBG: Recent Labs  Lab 12/11/20 1051 12/12/20 1116  GLUCAP 117* 207*   Lipid Profile: No results for input(s): CHOL, HDL, LDLCALC, TRIG, CHOLHDL, LDLDIRECT in the last 72 hours. Thyroid Function Tests: No results for input(s): TSH, T4TOTAL, FREET4, T3FREE, THYROIDAB in the last 72 hours. Anemia Panel: No results for input(s): VITAMINB12, FOLATE,  FERRITIN, TIBC, IRON, RETICCTPCT in the last 72 hours. Sepsis Labs: No results for input(s): PROCALCITON, LATICACIDVEN in the last 168 hours.  Recent Results (from the past 240 hour(s))  Resp Panel by RT-PCR (Flu A&B, Covid) Nasopharyngeal Swab     Status: None   Collection Time: 12/11/20  9:03 AM   Specimen: Nasopharyngeal Swab; Nasopharyngeal(NP) swabs in vial transport medium  Result Value Ref Range Status   SARS Coronavirus 2 by RT PCR NEGATIVE NEGATIVE Final    Comment: (NOTE) SARS-CoV-2 target nucleic acids are NOT DETECTED.  The SARS-CoV-2 RNA is generally detectable in upper respiratory specimens during the acute phase of infection. The  lowest concentration of SARS-CoV-2 viral copies this assay can detect is 138 copies/mL. A negative result does not preclude SARS-Cov-2 infection and should not be used as the sole basis for treatment or other patient management decisions. A negative result may occur with  improper specimen collection/handling, submission of specimen other than nasopharyngeal swab, presence of viral mutation(s) within the areas targeted by this assay, and inadequate number of viral copies(<138 copies/mL). A negative result must be combined with clinical observations, patient history, and epidemiological information. The expected result is Negative.  Fact Sheet for Patients:  EntrepreneurPulse.com.au  Fact Sheet for Healthcare Providers:  IncredibleEmployment.be  This test is no t yet approved or cleared by the [place] FDA and  has been authorized for detection and/or diagnosis of SARS-CoV-2 by FDA under an Emergency Use Authorization (EUA). This EUA will remain  in effect (meaning this test can be used) for the duration of the COVID-19 declaration under Section 564(b)(1) of the Act, 21 U.S.C.section 360bbb-3(b)(1), unless the authorization is terminated  or revoked sooner.       Influenza A by PCR NEGATIVE NEGATIVE Final   Influenza B by PCR NEGATIVE NEGATIVE Final    Comment: (NOTE) The Xpert Xpress SARS-CoV-2/FLU/RSV plus assay is intended as an aid in the diagnosis of influenza from Nasopharyngeal swab specimens and should not be used as a sole basis for treatment. Nasal washings and aspirates are unacceptable for Xpert Xpress SARS-CoV-2/FLU/RSV testing.  Fact Sheet for Patients: EntrepreneurPulse.com.au  Fact Sheet for Healthcare Providers: IncredibleEmployment.be  This test is not yet approved or cleared by the [place] FDA and has been authorized for detection and/or diagnosis of SARS-CoV-2 by FDA under  an Emergency Use Authorization (EUA). This EUA will remain in effect (meaning this test can be used) for the duration of the COVID-19 declaration under Section 564(b)(1) of the Act, 21 U.S.C. section 360bbb-3(b)(1), unless the authorization is terminated or revoked.  Performed at [facility], [address] [place], [place] [address]   MRSA PCR Screening     Status: None   Collection Time: 12/11/20 10:59 AM   Specimen: Nasal Mucosa; Nasopharyngeal  Result Value Ref Range Status   MRSA by PCR NEGATIVE NEGATIVE Final    Comment:        The GeneXpert MRSA Assay (FDA approved for NASAL specimens only), is one component of a comprehensive MRSA colonization surveillance program. It is not intended to diagnose MRSA infection nor to guide or monitor treatment for MRSA infections. Performed at [facility], [address] [place], [place] [address]          Radiology Studies: CT CHEST WO CONTRAST  Result Date: 12/11/2020 CLINICAL DATA:  Shortness of breath EXAM: CT CHEST WITHOUT CONTRAST TECHNIQUE: Multidetector CT imaging of the chest was performed following the standard protocol without IV contrast. COMPARISON:  January 21, 2016 FINDINGS: Cardiovascular: Cardiomegaly. Three-vessel coronary artery atherosclerotic calcifications. Atherosclerotic calcifications throughout the aorta. No pericardial effusion. Mediastinum/Nodes: Thyroid is unremarkable. No axillary adenopathy. Mildly enlarged mediastinal lymph nodes. Pretracheal lymph node measures 13 mm in the short axis, previously 11 mm (series 2, image 54). Pretracheal lymph node measures 13 mm in the short axis, previously 10 mm (series 2, image 45). Lungs/Pleura: Moderate RIGHT greater than LEFT pleural effusion. Interlobular septal thickening. Centrilobular ground-glass opacities in an upper lobe predominant distribution. Bronchial wall thickening. Bibasilar consolidative opacities, likely atelectasis. Upper Abdomen:  No acute abnormality. Musculoskeletal: Coarsely calcified LEFT lumpectomy bed. Dextroscoliosis of the thoracolumbar junction. IMPRESSION: 1. Constellation of findings are favored to reflect pulmonary edema with moderate RIGHT greater than LEFT pleural effusions and bibasilar atelectasis. Atypical infection could present similarly. 2. Mildly enlarged mediastinal lymph nodes, likely reactive. Aortic Atherosclerosis (ICD10-I70.0). Electronically Signed   By: Valentino Saxon MD   On: 12/11/2020 10:59   DG Chest Port 1 View  Result Date: 12/11/2020 CLINICAL DATA:  80 year old female with shortness of breath. EXAM: PORTABLE CHEST 1 VIEW COMPARISON:  09/28/2020 FINDINGS: The heart size and mediastinal contours are within normal limits. Right juxtaphrenic peak sign. Blunting of the right costophrenic angle. Similar appearing lingular cicatricial atelectasis. Left basilar subsegmental atelectasis. No pneumothorax. The visualized skeletal structures are unremarkable. IMPRESSION: Right middle lobar atelectasis. Left basilar subsegmental atelectasis. Consider chest CT for further characterization in the appropriate clinical setting. Electronically Signed   By: Ruthann Cancer MD   On: 12/11/2020 09:19        Scheduled Meds: . allopurinol  300 mg Oral Daily  . amiodarone  200 mg Oral BID  . amLODipine  5 mg Oral Daily  . apixaban  2.5 mg Oral BID  . aspirin  81 mg Oral Daily  . carbidopa-levodopa  2 tablet Oral TID with meals  . Carbidopa-Levodopa ER  1 tablet Oral QHS  . Chlorhexidine Gluconate Cloth  6 each Topical Q0600  . cholecalciferol  2,000 Units Oral Daily  . donepezil  10 mg Oral QHS  . feeding supplement  237 mL Oral BID BM  . furosemide  40 mg Intravenous BID  . ipratropium-albuterol  3 mL Nebulization Q6H  . magnesium oxide  400 mg Oral Daily  . mouth rinse  15 mL Mouth Rinse BID  . metoprolol tartrate  50 mg Oral BID  . multivitamin with minerals  1 tablet Oral Daily  . pantoprazole  40  mg Oral Daily  . senna-docusate  2 tablet Oral BID  . simvastatin  10 mg Oral Daily  . sodium chloride flush  3 mL Intravenous Q12H   Continuous Infusions: . sodium chloride      Assessment & Plan:   Principal Problem:   Hypertensive emergency Active Problems:   CAD (coronary artery disease)   Chronic deep vein thrombosis (DVT) (HCC)   Paroxysmal atrial fibrillation (HCC)   Acute on chronic diastolic CHF (congestive heart failure) (HCC)   Parkinson disease ([place])   Hypertensive emergency (POA)-present on admission Patient presents for evaluation of worsening shortness of breath and her blood pressure on admission was 243/113 Review of records shows she is supposed to be on metoprolol 50 mg twice daily but daughter stated it was discontinued 3 weeks ago by her primary care provider Patient started on nitro drip  3/8-clinically has improved BP better controlled  Amlodipine was decreased from 10 to 5 mg to be started today as patient was hypotensive yesterday Continue to  monitor     Acute on chronic diastolic dysfunction CHF Secondary to hypertensive emergency BNP elevated Echo from 2020 showed LVEF 60 to 65% 3/8-cardiology following  Clinically improving Continue IV Lasix Monitor renal function Follow-up echo-pending I/o  Daily weight   AKI- Renal function mildly elevated likley 2/2 diuresis, however cannot r/o cardio renal synd. Monitor levels closely  I/o   Elevated troponin-mildly Likely due to demand ischemia No chest pain Likely in the setting of hypertensive emergency and acute on chronic diastolic heart failure Follow-up echocardiogram Cardiology following   CAD (coronary artery disease) Continue aspirin, statin, metoprolol      Paroxysmal atrial fibrillation with RVR Heart rate controlled  Continue amiodarone and metoprolol  Continue Eliquis for primary prophylaxis for stroke prevention     Parkinson disease ([place]) Continue  carbidopa levodopa   Dementia Continue donepezil   GERD Continue Protonix    Sick sinus syndrome S/P pacemaker insertion Stable   DVT prophylaxis: Eliquis Code Status: DNR Family Communication: None at bedside  Status is: Inpatient  Remains inpatient appropriate because:IV treatments appropriate due to intensity of illness or inability to take PO   Dispo: The patient is from: Home              Anticipated d/c is to: Home              Patient currently is not medically stable to d/c.   Difficult to place patient No                    Anticipated dc date: 2 days.  Still on IV diuretics           LOS: 2 days   Time spent: 35 minutes with more than 50% on [place], MD [facility] Pager [phone]  If 7PM-7AM, please contact night-coverage 12/13/2020, 8:28 AM

## 2020-12-14 DIAGNOSIS — I161 Hypertensive emergency: Secondary | ICD-10-CM | POA: Diagnosis not present

## 2020-12-14 DIAGNOSIS — I509 Heart failure, unspecified: Secondary | ICD-10-CM | POA: Diagnosis not present

## 2020-12-14 DIAGNOSIS — G20 Parkinson's disease: Secondary | ICD-10-CM | POA: Diagnosis not present

## 2020-12-14 DIAGNOSIS — I480 Paroxysmal atrial fibrillation: Secondary | ICD-10-CM | POA: Diagnosis not present

## 2020-12-14 LAB — BRAIN NATRIURETIC PEPTIDE: B Natriuretic Peptide: 414.3 pg/mL — ABNORMAL HIGH (ref 0.0–100.0)

## 2020-12-14 LAB — BASIC METABOLIC PANEL
Anion gap: 12 (ref 5–15)
BUN: 36 mg/dL — ABNORMAL HIGH (ref 8–23)
CO2: 30 mmol/L (ref 22–32)
Calcium: 9.6 mg/dL (ref 8.9–10.3)
Chloride: 96 mmol/L — ABNORMAL LOW (ref 98–111)
Creatinine, Ser: 1.09 mg/dL — ABNORMAL HIGH (ref 0.44–1.00)
GFR, Estimated: 51 mL/min — ABNORMAL LOW (ref >60)
Glucose, Bld: 92 mg/dL (ref 70–99)
Potassium: 3.7 mmol/L (ref 3.5–5.1)
Sodium: 138 mmol/L (ref 135–145)

## 2020-12-14 MED ORDER — MECLIZINE HCL 12.5 MG PO TABS
12.5000 mg | ORAL_TABLET | Freq: Two times a day (BID) | ORAL | Status: DC | PRN
  Administered 2020-12-14 (×2): 12.5 mg via ORAL
  Filled 2020-12-14 (×3): qty 1

## 2020-12-14 MED ORDER — FUROSEMIDE 20 MG PO TABS
40.0000 mg | ORAL_TABLET | Freq: Every day | ORAL | Status: DC
  Administered 2020-12-14: 40 mg via ORAL
  Filled 2020-12-14: qty 2

## 2020-12-14 MED ORDER — SCOPOLAMINE 1 MG/3DAYS TD PT72
1.0000 | MEDICATED_PATCH | TRANSDERMAL | Status: DC
  Administered 2020-12-14: 1.5 mg via TRANSDERMAL
  Filled 2020-12-14: qty 1

## 2020-12-14 MED ORDER — FUROSEMIDE 20 MG PO TABS: 20.0000 mg | ORAL_TABLET | Freq: Every day | ORAL | Status: DC | PRN

## 2020-12-14 MED ORDER — AMLODIPINE BESYLATE 5 MG PO TABS
5.0000 mg | ORAL_TABLET | Freq: Every day | ORAL | Status: DC
  Administered 2020-12-14: 5 mg via ORAL
  Filled 2020-12-14: qty 1

## 2020-12-14 MED ORDER — AMIODARONE HCL 200 MG PO TABS
200.0000 mg | ORAL_TABLET | Freq: Every day | ORAL | Status: DC
  Administered 2020-12-15 – 2020-12-16 (×2): 200 mg via ORAL
  Filled 2020-12-14 (×2): qty 1

## 2020-12-14 MED ORDER — IPRATROPIUM-ALBUTEROL 0.5-2.5 (3) MG/3ML IN SOLN: 3.0000 mL | RESPIRATORY_TRACT | Status: DC | PRN

## 2020-12-14 MED ORDER — AMLODIPINE BESYLATE 10 MG PO TABS: 10.0000 mg | ORAL_TABLET | Freq: Every day | ORAL | Status: DC

## 2020-12-14 NOTE — Progress Notes (Signed)
[facility] Cardiology    SUBJECTIVE: The patient reports feeling better. She reports some dizziness when working with PT. She denies shortness of breath, chest pain at this time, or palpitations.   Vitals:   12/14/20 0500 12/14/20 0600 12/14/20 0700 12/14/20 0755  BP: 119/72 134/79 (!) 142/101   Pulse: 60 (!) 35 89 100  Resp: (!) 9 16 19  (!) 23  Temp:      TempSrc:      SpO2: 97% 97% 97% 96%  Weight: 55.8 kg     Height:         Intake/Output Summary (Last 24 hours) at 12/14/2020 2130 Last data filed at 12/14/2020 0500 Gross per 24 hour  Intake 360 ml  Output 2100 ml  Net -1740 ml      PHYSICAL EXAM   General: Well developed, well nourished, in no acute distress, ;lying in bed, appears comfortable, smiling HEENT:  Normocephalic and atramatic Neck: JVD Lungs: normal effort of breathing on room air, no wheezing or crackles Heart: irregularly irregular, 2/6 systolic murmur  Abdomen: nondistended Msk:  Normal strength and tone for age. Extremities: No clubbing, cyanosis or edema.   Neuro: Alert and oriented X 3. Psych:  Good affect, responds appropriately  LABS: Basic Metabolic Panel: Recent Labs    12/13/20 0450 12/14/20 0420  NA 138 138  K 3.9 3.7  CL 100 96*  CO2 27 30  GLUCOSE 94 92  BUN 40* 36*  CREATININE 1.23* 1.09*  CALCIUM 9.4 9.6   Liver Function Tests: No results for input(s): AST, ALT, ALKPHOS, BILITOT, PROT, ALBUMIN in the last 72 hours. No results for input(s): LIPASE, AMYLASE in the last 72 hours. CBC: No results for input(s): WBC, NEUTROABS, HGB, HCT, MCV, PLT in the last 72 hours. Cardiac Enzymes: No results for input(s): CKTOTAL, CKMB, CKMBINDEX, TROPONINI in the last 72 hours. BNP: Invalid input(s): POCBNP D-Dimer: No results for input(s): DDIMER in the last 72 hours. Hemoglobin A1C: No results for input(s): HGBA1C in the last 72 hours. Fasting Lipid Panel: No results for input(s): CHOL, HDL, LDLCALC, TRIG, CHOLHDL, LDLDIRECT in the last 72  hours. Thyroid Function Tests: No results for input(s): TSH, T4TOTAL, T3FREE, THYROIDAB in the last 72 hours.  Invalid input(s): FREET3 Anemia Panel: No results for input(s): VITAMINB12, FOLATE, FERRITIN, TIBC, IRON, RETICCTPCT in the last 72 hours.  ECHOCARDIOGRAM COMPLETE  Result Date: 12/13/2020    ECHOCARDIOGRAM REPORT   Patient Name:   Melinda Maxwell Date of Exam: 12/13/2020 Medical Rec #:  [id number]     Height:       61.0 in Accession #:    [id number]    Weight:       123.2 lb Date of Birth:  1940-10-29     BSA:          1.537 m Patient Age:    80 years      BP:           113/71 mmHg Patient Gender: F             HR:           87 bpm. Exam Location:  [facility] Procedure: 2D Echo, Color Doppler and Cardiac Doppler Indications:     I50.31 CHF-Acute Diastolic  History:         Patient has prior history of Echocardiogram examinations.                  Previous Myocardial Infarction and CAD; Risk Factors:Sleep  Apnea, Hypertension and Dyslipidemia.  Sonographer:     Charmayne Sheer RDCS (AE) Referring Phys:  [id number] Collier Bullock Diagnosing Phys: Yolonda Kida MD IMPRESSIONS  1. Mod/severe thickened MV.  2. Left ventricular ejection fraction, by estimation, is 55 to 60%. The left ventricle has normal function. The left ventricle has no regional wall motion abnormalities. Left ventricular diastolic parameters were normal.  3. Right ventricular systolic function is normal. The right ventricular size is normal.  4. The mitral valve is myxomatous. Trivial mitral valve regurgitation. Moderate mitral annular calcification.  5. The aortic valve is normal in structure. Aortic valve regurgitation is not visualized. FINDINGS  Left Ventricle: Left ventricular ejection fraction, by estimation, is 55 to 60%. The left ventricle has normal function. The left ventricle has no regional wall motion abnormalities. The left ventricular internal cavity size was normal in size. There is  no left ventricular hypertrophy.  Left ventricular diastolic parameters were normal. Right Ventricle: The right ventricular size is normal. No increase in right ventricular wall thickness. Right ventricular systolic function is normal. Left Atrium: Left atrial size was normal in size. Right Atrium: Right atrial size was normal in size. Pericardium: There is no evidence of pericardial effusion. Mitral Valve: The mitral valve is myxomatous. There is severe thickening of the mitral valve leaflet(s). There is moderate calcification of the mitral valve leaflet(s). Normal mobility of the mitral valve leaflets. Moderate mitral annular calcification. Trivial mitral valve regurgitation. MV peak gradient, 6.5 mmHg. The mean mitral valve gradient is 3.0 mmHg. Tricuspid Valve: The tricuspid valve is normal in structure. Tricuspid valve regurgitation is not demonstrated. Aortic Valve: The aortic valve is normal in structure. Aortic valve regurgitation is not visualized. Aortic valve mean gradient measures 8.5 mmHg. Aortic valve peak gradient measures 13.8 mmHg. Aortic valve area, by VTI measures 1.72 cm. Pulmonic Valve: The pulmonic valve was normal in structure. Pulmonic valve regurgitation is not visualized. Aorta: The ascending aorta was not well visualized. IAS/Shunts: No atrial level shunt detected by color flow Doppler. Additional Comments: Mod/severe thickened MV.  LEFT VENTRICLE PLAX 2D LVIDd:         3.60 cm  Diastology LVIDs:         2.50 cm  LV e' lateral:   6.31 cm/s LV PW:         1.00 cm  LV E/e' lateral: 23.1 LV IVS:        1.00 cm LVOT diam:     1.70 cm LV SV:         52 LV SV Index:   34 LVOT Area:     2.27 cm  RIGHT VENTRICLE RV Basal diam:  2.60 cm LEFT ATRIUM           Index       RIGHT ATRIUM          Index LA diam:      4.10 cm 2.67 cm/m  RA Area:     9.34 cm LA Vol (A2C): 43.3 ml 28.16 ml/m RA Volume:   16.10 ml 10.47 ml/m LA Vol (A4C): 72.5 ml 47.16 ml/m  AORTIC VALVE                    PULMONIC VALVE AV Area (Vmax):    1.85 cm      PV Vmax:       1.26 m/s AV Area (Vmean):   1.58 cm     PV Vmean:      81.900 cm/s AV  Area (VTI):     1.72 cm     PV VTI:        0.210 m AV Vmax:           186.00 cm/s  PV Peak grad:  6.4 mmHg AV Vmean:          137.000 cm/s PV Mean grad:  3.0 mmHg AV VTI:            0.300 m AV Peak Grad:      13.8 mmHg AV Mean Grad:      8.5 mmHg LVOT Vmax:         152.00 cm/s LVOT Vmean:        95.200 cm/s LVOT VTI:          0.227 m LVOT/AV VTI ratio: 0.76  AORTA Ao Root diam: 3.10 cm MITRAL VALVE                TRICUSPID VALVE MV Area (PHT): 4.86 cm     TR Peak grad:   47.9 mmHg MV Area VTI:   2.04 cm     TR Vmax:        346.00 cm/s MV Peak grad:  6.5 mmHg MV Mean grad:  3.0 mmHg     SHUNTS MV Vmax:       1.27 m/s     Systemic VTI:  0.23 m MV Vmean:      78.2 cm/s    Systemic Diam: 1.70 cm MV Decel Time: 156 msec MV E velocity: 145.50 cm/s Dwayne D Callwood MD Electronically signed by Yolonda Kida MD Signature Date/Time: 12/13/2020/6:07:54 PM    Final      Echo LVEF 55-60%, myxomatous mitral valve with trivial regurgitation  TELEMETRY: atrial fibrillation, rates low 90s-110s, PVCs  ASSESSMENT AND PLAN:  Principal Problem:   Hypertensive emergency Active Problems:   CAD (coronary artery disease)   Chronic deep vein thrombosis (DVT) (HCC)   Paroxysmal atrial fibrillation (HCC)   Acute on chronic diastolic CHF (congestive heart failure) (HCC)   Parkinson disease ([place])    1.Acute on chronic diastolic congestive heart failure, exacerbated by hypertensive emergency,improved after initial diuresis,BNP as high as 1285. Repeat BNP pending. 2.Hypertensive emergency, with recent discontinuation of metoprolol tartrate, improved afternitroglycerin infusion, restarting metoprolol tartrate, with addition of amlodipine, which was later discontinued due to dizziness and low blood pressure. Mildly elevated this morning on metoprolol tartrate, amlodipine, and IV Lasix. Blood pressure has been labile. 3.Coronary  artery disease, status post multiple coronary stents, most recently drug-eluting stents proximal and distal LAD 02/11/2018, currently without chest pain 4.Borderline elevated troponin,(24->24-> 29-> 31-> 36)in the absence of chest pain, likely secondary to demand supply ischemia, in the setting of hypertensive emergency and acute on chronic diastolic congestive heart failure 5.Paroxysmal atrial fibrillation, on Eliquis for stroke prevention, metoprolol tartrateand amiodarone for rate and rhythm control 6.Sick sinus syndrome, status post Micra AVleadless pacemaker 10/05/2020  Recommendations: 1. Agree with current therapy 2. Switch to Lasix 40 mg PO daily 3.Continue amiodarone for rhythm control; continue metoprolol tartrate 50 mg twice dailyfor rate control. 4. Transfer to step down floor today 5. Consider heart failure consult prior to discharge 6. Recommend follow up with [name] in 1 week from discharge.  Sign off for now; please call with any questions.    Clabe Seal, PA-C 12/14/2020 8:28 AM

## 2020-12-14 NOTE — Progress Notes (Signed)
Provided continued visits to this very pleasant and sweet woman. Visits are reciprocal by being mutually enjoyable each time.

## 2020-12-14 NOTE — Progress Notes (Signed)
[place] [facility] [place] NAME: Melinda Maxwell    MR#:  [id number]  DATE OF BIRTH:  12-09-1940  SUBJECTIVE:  patient reports doing well. She occasionally has dizziness when she gets up. No recent falls. Blood pressure much improved since admission however intermittently gets low blood pressure.  REVIEW OF SYSTEMS:   Review of Systems  Constitutional: Negative for chills, fever and weight loss.  HENT: Negative for ear discharge, ear pain and nosebleeds.   Eyes: Negative for blurred vision, pain and discharge.  Respiratory: Negative for sputum production, shortness of breath, wheezing and stridor.   Cardiovascular: Negative for chest pain, palpitations, orthopnea and PND.  Gastrointestinal: Negative for abdominal pain, diarrhea, nausea and vomiting.  Genitourinary: Negative for frequency and urgency.  Musculoskeletal: Negative for back pain and joint pain.  Neurological: Positive for dizziness and weakness. Negative for sensory change, speech change and focal weakness.  Psychiatric/Behavioral: Negative for depression and hallucinations. The patient is not nervous/anxious.    Tolerating Diet: yes Tolerating PT: home health  DRUG ALLERGIES:   Allergies  Allergen Reactions  . Benzocaine Other (See Comments)    Unknown  . Lisinopril Cough  . Neomycin Other (See Comments)    On allergy testing  . Sulfa Antibiotics Rash  . Sulfamethoxazole-Trimethoprim Other (See Comments)    Unknown    VITALS:  Blood pressure 101/74, pulse 71, temperature 97.6 F (36.4 C), temperature source Axillary, resp. rate (!) 23, height 5\' 1"  (1.549 m), weight 55.8 kg, SpO2 96 %.  PHYSICAL EXAMINATION:   Physical Exam  GENERAL:  80 y.o.-year-old patient lying in the bed with no acute distress.  LUNGS: Normal breath sounds bilaterally, no wheezing, rales, rhonchi. No use of accessory muscles of respiration.  CARDIOVASCULAR: S1, S2 normal. No murmurs, rubs, or  gallops.  ABDOMEN: Soft, nontender, nondistended. Bowel sounds present. No organomegaly or mass.  EXTREMITIES: No cyanosis, clubbing or edema b/l.    NEUROLOGIC: nonfocal PSYCHIATRIC:  patient is alert and pleasantly confused.  SKIN: No obvious rash, lesion, or ulcer.   LABORATORY PANEL:  CBC Recent Labs  Lab 12/11/20 0828  WBC 7.0  HGB 9.8*  HCT 31.7*  PLT 397    Chemistries  Recent Labs  Lab 12/11/20 0828 12/12/20 0617 12/14/20 0420  NA 141   < > 138  K 3.7   < > 3.7  CL 107   < > 96*  CO2 21*   < > 30  GLUCOSE 116*   < > 92  BUN 16   < > 36*  CREATININE 1.10*   < > 1.09*  CALCIUM 9.7   < > 9.6  AST 23  --   --   ALT 15  --   --   ALKPHOS 95  --   --   BILITOT 1.0  --   --    < > = values in this interval not displayed.   Cardiac Enzymes No results for input(s): TROPONINI in the last 168 hours. RADIOLOGY:  ECHOCARDIOGRAM COMPLETE  Result Date: 12/13/2020    ECHOCARDIOGRAM REPORT   Patient Name:   Melinda Maxwell Date of Exam: 12/13/2020 Medical Rec #:  [id number]     Height:       61.0 in Accession #:    [id number]    Weight:       123.2 lb Date of Birth:  08-11-1941     BSA:  1.537 m Patient Age:    80 years      BP:           113/71 mmHg Patient Gender: F             HR:           87 bpm. Exam Location:  [facility] Procedure: 2D Echo, Color Doppler and Cardiac Doppler Indications:     I50.31 CHF-Acute Diastolic  History:         Patient has prior history of Echocardiogram examinations.                  Previous Myocardial Infarction and CAD; Risk Factors:Sleep                  Apnea, Hypertension and Dyslipidemia.  Sonographer:     Charmayne Sheer RDCS (AE) Referring Phys:  [id number] Collier Bullock Diagnosing Phys: Yolonda Kida MD IMPRESSIONS  1. Mod/severe thickened MV.  2. Left ventricular ejection fraction, by estimation, is 55 to 60%. The left ventricle has normal function. The left ventricle has no regional wall motion abnormalities. Left ventricular diastolic parameters  were normal.  3. Right ventricular systolic function is normal. The right ventricular size is normal.  4. The mitral valve is myxomatous. Trivial mitral valve regurgitation. Moderate mitral annular calcification.  5. The aortic valve is normal in structure. Aortic valve regurgitation is not visualized. FINDINGS  Left Ventricle: Left ventricular ejection fraction, by estimation, is 55 to 60%. The left ventricle has normal function. The left ventricle has no regional wall motion abnormalities. The left ventricular internal cavity size was normal in size. There is  no left ventricular hypertrophy. Left ventricular diastolic parameters were normal. Right Ventricle: The right ventricular size is normal. No increase in right ventricular wall thickness. Right ventricular systolic function is normal. Left Atrium: Left atrial size was normal in size. Right Atrium: Right atrial size was normal in size. Pericardium: There is no evidence of pericardial effusion. Mitral Valve: The mitral valve is myxomatous. There is severe thickening of the mitral valve leaflet(s). There is moderate calcification of the mitral valve leaflet(s). Normal mobility of the mitral valve leaflets. Moderate mitral annular calcification. Trivial mitral valve regurgitation. MV peak gradient, 6.5 mmHg. The mean mitral valve gradient is 3.0 mmHg. Tricuspid Valve: The tricuspid valve is normal in structure. Tricuspid valve regurgitation is not demonstrated. Aortic Valve: The aortic valve is normal in structure. Aortic valve regurgitation is not visualized. Aortic valve mean gradient measures 8.5 mmHg. Aortic valve peak gradient measures 13.8 mmHg. Aortic valve area, by VTI measures 1.72 cm. Pulmonic Valve: The pulmonic valve was normal in structure. Pulmonic valve regurgitation is not visualized. Aorta: The ascending aorta was not well visualized. IAS/Shunts: No atrial level shunt detected by color flow Doppler. Additional Comments: Mod/severe thickened MV.   LEFT VENTRICLE PLAX 2D LVIDd:         3.60 cm  Diastology LVIDs:         2.50 cm  LV e' lateral:   6.31 cm/s LV PW:         1.00 cm  LV E/e' lateral: 23.1 LV IVS:        1.00 cm LVOT diam:     1.70 cm LV SV:         52 LV SV Index:   34 LVOT Area:     2.27 cm  RIGHT VENTRICLE RV Basal diam:  2.60 cm LEFT ATRIUM  Index       RIGHT ATRIUM          Index LA diam:      4.10 cm 2.67 cm/m  RA Area:     9.34 cm LA Vol (A2C): 43.3 ml 28.16 ml/m RA Volume:   16.10 ml 10.47 ml/m LA Vol (A4C): 72.5 ml 47.16 ml/m  AORTIC VALVE                    PULMONIC VALVE AV Area (Vmax):    1.85 cm     PV Vmax:       1.26 m/s AV Area (Vmean):   1.58 cm     PV Vmean:      81.900 cm/s AV Area (VTI):     1.72 cm     PV VTI:        0.210 m AV Vmax:           186.00 cm/s  PV Peak grad:  6.4 mmHg AV Vmean:          137.000 cm/s PV Mean grad:  3.0 mmHg AV VTI:            0.300 m AV Peak Grad:      13.8 mmHg AV Mean Grad:      8.5 mmHg LVOT Vmax:         152.00 cm/s LVOT Vmean:        95.200 cm/s LVOT VTI:          0.227 m LVOT/AV VTI ratio: 0.76  AORTA Ao Root diam: 3.10 cm MITRAL VALVE                TRICUSPID VALVE MV Area (PHT): 4.86 cm     TR Peak grad:   47.9 mmHg MV Area VTI:   2.04 cm     TR Vmax:        346.00 cm/s MV Peak grad:  6.5 mmHg MV Mean grad:  3.0 mmHg     SHUNTS MV Vmax:       1.27 m/s     Systemic VTI:  0.23 m MV Vmean:      78.2 cm/s    Systemic Diam: 1.70 cm MV Decel Time: 156 msec MV E velocity: 145.50 cm/s Yolonda Kida MD Electronically signed by Yolonda Kida MD Signature Date/Time: 12/13/2020/6:07:54 PM    Final    ASSESSMENT AND PLAN:  de[age] mcl[age] y.o.femalewith medical history significant for CAD,chronicdiastolic CHF, paroxysmal A. Fib/DVTon Xarelto, Parkinson's disease, chronic dizziness and physical deconditioning, HTN who presentsto the emergency room by EMS for evaluation of worsening shortness of breath over the last 5 days  Hypertensive emergency(POA)-present  on admission Labile HTN --Patient presents for evaluation of worsening shortness of breath and her blood pressure on admission was 243/113 --Review of records shows she is supposed to be on metoprolol 50 mg twice daily but daughter stated it was discontinued 3 weeks ago by her primary care provider --Patient started on nitro drip --now d/ced --3/9-- BP earlier was high later in the day in the upper 90s with map 65 and above. -- Discussed with daughter regarding labile hypertension she voiced understanding. For now will continue amiodarone 200 mg daily, metoprolol 50 mg BID, Lasix 20 mg daily PRN. -- I have discontinued amlodipine. -- Patient was seen by [facility] cardiology. they have sign off.  Acute on chronic diastolic dysfunction CHF Secondary to hypertensive emergency --BNP elevated 1200--414 --Echo from 2020 showed  LVEF 60 to 65% -- received IV Lasix. Patient diuresis well. Urine output about 6.7 L since -- page 2 Lasix 20 mg daily PRN. This was discussed with patient's daughter [name] agreeable.  AKI superimposed on chronic kidney disease stage IIIa -- baseline creatinine 1.0--1.2. GFR 50-59 Renal function mildly elevated likley 2/2 diuresis, however cannot r/o cardio renal synd.  Elevated troponin-mildly Likely due to demand ischemia No chest pain Likely in the setting of hypertensive emergency and acute on chronic diastolic heart failure Follow-up echocardiogram  CAD (coronary artery disease) Continue aspirin, statin, metoprolol   Paroxysmal atrial fibrillation with RVR Heart rate controlled  Continue amiodarone and metoprolol  Continue Eliquis for primary prophylaxis for stroke prevention   Chronic dizziness -- PRN meclizine. I discussed with daughter 74 consider ENT evaluation as outpatient  Parkinson disease ([place]) Continue carbidopa- levodopa  Dementia Continue donepezil  GERD Continue Protonix  Sick sinus syndrome S/P pacemaker insertion in  09/2020    DVT prophylaxis: Eliquis Code Status: DNR Family Communication:  daughter [name] on the phone  Status is: Inpatient  Remains inpatient appropriate because:IV treatments appropriate due to intensity of illness or inability to take PO   Dispo: The patient is from: Home  Anticipated d/c is to: Home  Patient currently is not medically stable to d/c.              Difficult to place patient No                     patient overall is improving. Her blood pressure still remains labile. Will continue to monitor for one more day. Physical therapy to revisit today. Discharged to home with home health tomorrow if remains stable.  Level of care: Med-Surg Status is: Inpatient         TOTAL TIME TAKING CARE OF THIS PATIENT: *25 minutes.  >50% time spent on counselling and coordination of care  Note: This dictation was prepared with Dragon dictation along with smaller phrase technology. Any transcriptional errors that result from this process are unintentional.  Fritzi Mandes M.D    [facility]   CC: Primary care physician; [name], MDPatient ID: Salli Real, female   DOB: August 07, 1941, 80 y.o.   MRN: [id number]

## 2020-12-14 NOTE — Progress Notes (Signed)
Physical Therapy Treatment Patient Details Name: Melinda Maxwell MRN: [id number] DOB: 04-Mar-1941 Today's Date: 12/14/2020    History of Present Illness presented to ER secondary to progressive SOB; admitted for management of hypertensive emergency, acute/chronic CHF exacerbation.    PT Comments    Pt was long sitting in bed upon arriving. She is A and cooperative however is disoriented to time and situation. Was able to consistently follow commands with increased time. Resting BP 108/62. Upon sitting up EOB 101/64. Does endorse lightheadedness/dizziness that resolved after  Several minutes. Pt requested to use BR. Was able to stand and ambulate to toilet with min assist to prevent LOB. Pt urinated and then ambulated back to bed. Pt does endorse dizziness after using BR. Returned to bed. BP 113/68 once in long sitting. Chief Strategy Officer discussed with pt/MD/and pt's daughter concerns about DC to home. Acute PT changing reccomendations to DC to SNF to address deficits prior to returning home. Pt was in bed with call bell in reach at conclusion of session.     Follow Up Recommendations  SNF;Supervision/Assistance - 24 hour;Supervision for mobility/OOB     Equipment Recommendations  Other (comment) (defer to next level of care)       Precautions / Restrictions Precautions Precautions: Fall Restrictions Weight Bearing Restrictions: No    Mobility  Bed Mobility Overal bed mobility: Needs Assistance Bed Mobility: Supine to Sit;Sit to Supine     Supine to sit: Min assist Sit to supine: Mod assist   General bed mobility comments: pt continues to require assistance to exit and re-enter bed    Transfers Overall transfer level: Needs assistance Equipment used: Rolling walker (2 wheeled) Transfers: Sit to/from Stand Sit to Stand: Min assist;Mod assist         General transfer comment: Min assist to stand from elevated bed height. mod assist from lower toilet surface  height  Ambulation/Gait Ambulation/Gait assistance: Min assist Gait Distance (Feet): 12 Feet Assistive device: Rolling walker (2 wheeled) Gait Pattern/deviations: Step-to pattern;Trunk flexed;Narrow base of support Gait velocity: decreased   General Gait Details: Pt was able to ambulate 2 x 12 ft with RW + min assist. slightly unsteady when turning.      Balance Overall balance assessment: Needs assistance Sitting-balance support: No upper extremity supported;Feet supported Sitting balance-Leahy Scale: Good     Standing balance support: Bilateral upper extremity supported Standing balance-Leahy Scale: Poor         Cognition Arousal/Alertness: Awake/alert Behavior During Therapy: WFL for tasks assessed/performed Overall Cognitive Status: No family/caregiver present to determine baseline cognitive functioning          General Comments: Pt is alert and able to consistently follow commands however disoriented. she did not know day of week, month but did know year. oriented to self but confused about reason for being in hospital.             Pertinent Vitals/Pain Pain Assessment: No/denies pain Faces Pain Scale: No hurt           PT Goals (current goals can now be found in the care plan section) Acute Rehab PT Goals Patient Stated Goal: to return home Progress towards PT goals: Progressing toward goals    Frequency    Min 2X/week      PT Plan Discharge plan needs to be updated       AM-PAC PT "6 Clicks" Mobility   Outcome Measure  Help needed turning from your back to your side while in a flat bed without  using bedrails?: A Little Help needed moving from lying on your back to sitting on the side of a flat bed without using bedrails?: A Lot Help needed moving to and from a bed to a chair (including a wheelchair)?: A Little Help needed standing up from a chair using your arms (e.g., wheelchair or bedside chair)?: A Little Help needed to walk in hospital  room?: A Little Help needed climbing 3-5 steps with a railing? : A Lot 6 Click Score: 16    End of Session Equipment Utilized During Treatment: Gait belt Activity Tolerance: Patient tolerated treatment well Patient left: in bed;with bed alarm set;with call bell/phone within reach Nurse Communication: Mobility status PT Visit Diagnosis: Difficulty in walking, not elsewhere classified (R26.2)     Time: 1540-1606 PT Time Calculation (min) (ACUTE ONLY): 26 min  Charges:  $Gait Training: 8-22 mins $Therapeutic Activity: 8-22 mins                     Julaine Fusi PTA 12/14/20, 4:38 PM

## 2020-12-15 DIAGNOSIS — I161 Hypertensive emergency: Secondary | ICD-10-CM | POA: Diagnosis not present

## 2020-12-15 DIAGNOSIS — G20 Parkinson's disease: Secondary | ICD-10-CM | POA: Diagnosis not present

## 2020-12-15 DIAGNOSIS — I480 Paroxysmal atrial fibrillation: Secondary | ICD-10-CM | POA: Diagnosis not present

## 2020-12-15 DIAGNOSIS — I509 Heart failure, unspecified: Secondary | ICD-10-CM | POA: Diagnosis not present

## 2020-12-15 LAB — BASIC METABOLIC PANEL
Anion gap: 11 (ref 5–15)
BUN: 47 mg/dL — ABNORMAL HIGH (ref 8–23)
CO2: 31 mmol/L (ref 22–32)
Calcium: 10.1 mg/dL (ref 8.9–10.3)
Chloride: 95 mmol/L — ABNORMAL LOW (ref 98–111)
Creatinine, Ser: 1.39 mg/dL — ABNORMAL HIGH (ref 0.44–1.00)
GFR, Estimated: 38 mL/min — ABNORMAL LOW (ref >60)
Glucose, Bld: 90 mg/dL (ref 70–99)
Potassium: 4.4 mmol/L (ref 3.5–5.1)
Sodium: 137 mmol/L (ref 135–145)

## 2020-12-15 LAB — MAGNESIUM: Magnesium: 2.3 mg/dL (ref 1.7–2.4)

## 2020-12-15 NOTE — TOC Progression Note (Signed)
Transition of Care [facility]) - Progression Note    Patient Details  Name: Melinda Maxwell MRN: [id number] Date of Birth: 05/07/1941  [facility]) CM/SW [place], [place] Phone Number: [phone] 12/15/2020, 1:00 PM  Clinical Narrative:     CSW started SNF bed search. CSW called and left voicemail for Ananias Pilgrim at [facility] [phone] to ask about bed placement.  CSW spoke with Lavella Lemons in admissions and gave her patient information and she would relay the patient information to [facility] for the patient.  Expected Discharge Plan: [place] Barriers to Discharge: Continued Medical Work up,Family Issues  Expected Discharge Plan and Services Expected Discharge Plan: [place] In-house Referral: Clinical Social Work   Post Acute Care Choice: [place] Living arrangements for the past 2 months: Single Family Home                                       Social Determinants of Health (SDOH) Interventions    Readmission Risk Interventions No flowsheet data found.

## 2020-12-15 NOTE — NC FL2 (Signed)
[place] LEVEL OF CARE SCREENING TOOL     IDENTIFICATION  Patient Name: Melinda Maxwell Birthdate: 1941/01/19 Sex: female Admission Date (Current Location): 12/11/2020  [place] and [place] Number:  Engineering geologist and Address:  [facility], [address], [place], [place] [address]      Provider Number: [id number]  Attending Physician Name and Address:  Fritzi Mandes, MD  Relative Name and Phone Number:  Casey, Maxfield (Daughter)   [phone]    Current Level of Care: SNF Recommended Level of Care: [place] Prior Approval Number:    Date Approved/Denied: 12/15/20 PASRR Number: [id number]  Discharge Plan: SNF    Current Diagnoses: Patient Active Problem List   Diagnosis Date Noted  . Acute on chronic congestive heart failure ([place])   . Hypertensive emergency 12/11/2020  . Sick sinus syndrome ([place])   . Hypokalemia 09/29/2020  . Type 2 diabetes mellitus without complication ([place]) 60/63/0160  . Chronic anticoagulation 09/29/2020  . Parkinson disease ([place]) 09/29/2020  . Subdural hematoma ([place]) 07/16/2019  . Closed displaced fracture of surgical neck of left humerus 07/16/2019  . Non-sustained ventricular tachycardia ([place]) 07/16/2019  . Acute on chronic diastolic CHF (congestive heart failure) ([place]) 07/16/2019  . CKD (chronic kidney disease), stage III ([place]) 07/16/2019  . Syncope and collapse   . Fall   . CAD (coronary artery disease) 03/26/2018  . Generalized weakness 03/26/2018  . Paroxysmal atrial fibrillation ([place]) 02/27/2018  . UTI (urinary tract infection) 02/18/2018  . Stable angina ([place]) 01/15/2018  . Hyperlipidemia, mixed 09/10/2014  . Moderate tricuspid insufficiency 09/10/2014  . Chronic deep vein thrombosis (DVT) (HCC) 08/10/2014  . Diabetes mellitus with stage 3 chronic kidney disease ([place]) 07/09/2014  . Benign essential hypertension 02/21/2014  . Gout 02/21/2014  . OSA (obstructive sleep  apnea) 02/21/2014    Orientation RESPIRATION BLADDER Height & Weight     Self,Situation,Place  Normal Continent Weight: 104 lb 4.4 oz (47.3 kg) Height:  5\' 1"  (154.9 cm)  BEHAVIORAL SYMPTOMS/MOOD NEUROLOGICAL BOWEL NUTRITION STATUS      Continent Diet  AMBULATORY STATUS COMMUNICATION OF NEEDS Skin   Extensive Assist Verbally Normal                       Personal Care Assistance Level of Assistance  Bathing,Dressing,Feeding Bathing Assistance: Limited assistance Feeding assistance: Limited assistance Dressing Assistance: Limited assistance     Functional Limitations Info  Sight,Hearing,Speech Sight Info: Adequate Hearing Info: Adequate Speech Info: Adequate    SPECIAL CARE FACTORS FREQUENCY       PT Min 5X per week                Contractures Contractures Info: Not present    Additional Factors Info                  Current Medications (12/15/2020):  This is the current hospital active medication list Current Facility-Administered Medications  Medication Dose Route Frequency Provider Last Rate Last Admin  . 0.9 %  sodium chloride infusion  250 mL Intravenous PRN Agbata, Tochukwu, MD      . acetaminophen (TYLENOL) tablet 650 mg  650 mg Oral Q4H PRN Agbata, Tochukwu, MD      . allopurinol (ZYLOPRIM) tablet 300 mg  300 mg Oral Daily Agbata, Tochukwu, MD   300 mg at 12/15/20 0950  . amiodarone (PACERONE) tablet 200 mg  200 mg Oral Daily Fritzi Mandes, MD   200 mg at 12/15/20  4742  . apixaban (ELIQUIS) tablet 2.5 mg  2.5 mg Oral BID Agbata, Tochukwu, MD   2.5 mg at 12/15/20 0951  . aspirin chewable tablet 81 mg  81 mg Oral Daily Agbata, Tochukwu, MD   81 mg at 12/15/20 0951  . carbidopa-levodopa (SINEMET IR) 25-100 MG per tablet immediate release 2 tablet  2 tablet Oral TID with meals Agbata, Tochukwu, MD   2 tablet at 12/15/20 0950  . Carbidopa-Levodopa ER (SINEMET CR) 25-100 MG tablet controlled release 1 tablet  1 tablet Oral QHS Agbata, Tochukwu, MD   1  tablet at 12/14/20 2148  . Chlorhexidine Gluconate Cloth 2 % PADS 6 each  6 each Topical Q0600 Agbata, Tochukwu, MD   6 each at 12/15/20 0637  . cholecalciferol (VITAMIN D3) tablet 2,000 Units  2,000 Units Oral Daily Agbata, Tochukwu, MD   2,000 Units at 12/15/20 0951  . donepezil (ARICEPT) tablet 10 mg  10 mg Oral QHS Agbata, Tochukwu, MD   10 mg at 12/14/20 2147  . feeding supplement (ENSURE ENLIVE / ENSURE PLUS) liquid 237 mL  237 mL Oral BID BM Agbata, Tochukwu, MD   237 mL at 12/15/20 0951  . furosemide (LASIX) tablet 20 mg  20 mg Oral Daily PRN Fritzi Mandes, MD      . ipratropium-albuterol (DUONEB) 0.5-2.5 (3) MG/3ML nebulizer solution 3 mL  3 mL Nebulization Q4H PRN Fritzi Mandes, MD      . magnesium oxide (MAG-OX) tablet 400 mg  400 mg Oral Daily Agbata, Tochukwu, MD   400 mg at 12/15/20 0950  . meclizine (ANTIVERT) tablet 12.5 mg  12.5 mg Oral BID PRN Fritzi Mandes, MD   12.5 mg at 12/14/20 1731  . MEDLINE mouth rinse  15 mL Mouth Rinse BID Agbata, Tochukwu, MD   15 mL at 12/15/20 0951  . metoprolol tartrate (LOPRESSOR) tablet 50 mg  50 mg Oral BID Agbata, Tochukwu, MD   50 mg at 12/15/20 0950  . multivitamin with minerals tablet 1 tablet  1 tablet Oral Daily Agbata, Tochukwu, MD   1 tablet at 12/15/20 0949  . nitroGLYCERIN (NITROSTAT) SL tablet 0.4 mg  0.4 mg Sublingual Q5 min PRN Agbata, Tochukwu, MD      . ondansetron (ZOFRAN) injection 4 mg  4 mg Intravenous Q6H PRN Agbata, Tochukwu, MD   4 mg at 12/14/20 0846  . pantoprazole (PROTONIX) EC tablet 40 mg  40 mg Oral Daily Agbata, Tochukwu, MD   40 mg at 12/15/20 0951  . scopolamine (TRANSDERM-SCOP) 1 MG/3DAYS 1.5 mg  1 patch Transdermal Q72H Fritzi Mandes, MD   1.5 mg at 12/14/20 2149  . senna-docusate (Senokot-S) tablet 2 tablet  2 tablet Oral BID Collier Bullock, MD   2 tablet at 12/15/20 0950  . simvastatin (ZOCOR) tablet 10 mg  10 mg Oral Daily Agbata, Tochukwu, MD   10 mg at 12/14/20 2147  . sodium chloride flush (NS) 0.9 % injection 3 mL   3 mL Intravenous Q12H Agbata, Tochukwu, MD   3 mL at 12/15/20 0952  . sodium chloride flush (NS) 0.9 % injection 3 mL  3 mL Intravenous PRN Agbata, Tochukwu, MD         Discharge Medications: Please see discharge summary for a list of discharge medications.  Relevant Imaging Results:  Relevant Lab Results:   Additional Information SS# [id number]  Adelene Amas, LCSWA

## 2020-12-15 NOTE — TOC Progression Note (Signed)
Transition of Care [facility]) - Progression Note    Patient Details  Name: Melinda Maxwell MRN: [id number] Date of Birth: 05/20/1941  Transition of Care [facility]) CM/SW [place], [place] Phone Number: [phone] 12/15/2020, 11:01 AM  Clinical Narrative:     PT recommends SNF placement.  CSW spoke with patient's Corra, Kaine (Daughter)  [phone] spoke with CSW.  Their preferred is [facility] in [place], and [facility] is not an option.  CSW explained SNF placement process and timeline. CSW will begin SNF bed search.  Expected Discharge Plan: [place] Barriers to Discharge: Continued Medical Work up,Family Issues  Expected Discharge Plan and Services Expected Discharge Plan: [place] In-house Referral: Clinical Social Work   Post Acute Care Choice: [place] Living arrangements for the past 2 months: Single Family Home                                       Social Determinants of Health (SDOH) Interventions    Readmission Risk Interventions No flowsheet data found.

## 2020-12-15 NOTE — Progress Notes (Signed)
Melinda Maxwell at [place] NAME: Melinda Maxwell    MR#:  [id number]  DATE OF BIRTH:  January 04, 1941  SUBJECTIVE:  patient reports doing well. She occasionally has dizziness when she gets up. No recent falls. Blood pressure much improved since admission however intermittently gets low blood pressure. Eating well REVIEW OF SYSTEMS:   Review of Systems  Constitutional: Negative for chills, fever and weight loss.  HENT: Negative for ear discharge, ear pain and nosebleeds.   Eyes: Negative for blurred vision, pain and discharge.  Respiratory: Negative for sputum production, shortness of breath, wheezing and stridor.   Cardiovascular: Negative for chest pain, palpitations, orthopnea and PND.  Gastrointestinal: Negative for abdominal pain, diarrhea, nausea and vomiting.  Genitourinary: Negative for frequency and urgency.  Musculoskeletal: Negative for back pain and joint pain.  Neurological: Positive for dizziness and weakness. Negative for sensory change, speech change and focal weakness.  Psychiatric/Behavioral: Negative for depression and hallucinations. The patient is not nervous/anxious.    Tolerating Diet: yes Tolerating PT: rehab per PT eval  DRUG ALLERGIES:   Allergies  Allergen Reactions  . Benzocaine Other (See Comments)    Unknown  . Lisinopril Cough  . Neomycin Other (See Comments)    On allergy testing  . Sulfa Antibiotics Rash  . Sulfamethoxazole-Trimethoprim Other (See Comments)    Unknown    VITALS:  Blood pressure 126/67, pulse 62, temperature 97.6 F (36.4 C), temperature source Oral, resp. rate 19, height 5\' 1"  (1.549 m), weight 47.3 kg, SpO2 97 %.  PHYSICAL EXAMINATION:   Physical Exam  GENERAL:  80 y.o.-year-old patient lying in the bed with no acute distress.  LUNGS: Normal breath sounds bilaterally, no wheezing, rales, rhonchi. No use of accessory muscles of respiration.  CARDIOVASCULAR: S1, S2 normal. No murmurs, rubs,  or gallops.  ABDOMEN: Soft, nontender, nondistended. Bowel sounds present. No organomegaly or mass.  EXTREMITIES: No cyanosis, clubbing or edema b/l.    NEUROLOGIC: nonfocal PSYCHIATRIC:  patient is alert and pleasantly confused.  SKIN: No obvious rash, lesion, or ulcer.   LABORATORY PANEL:  CBC Recent Labs  Lab 12/11/20 0828  WBC 7.0  HGB 9.8*  HCT 31.7*  PLT 397    Chemistries  Recent Labs  Lab 12/11/20 0828 12/12/20 0617 12/15/20 0754  NA 141   < > 137  K 3.7   < > 4.4  CL 107   < > 95*  CO2 21*   < > 31  GLUCOSE 116*   < > 90  BUN 16   < > 47*  CREATININE 1.10*   < > 1.39*  CALCIUM 9.7   < > 10.1  MG  --   --  2.3  AST 23  --   --   ALT 15  --   --   ALKPHOS 95  --   --   BILITOT 1.0  --   --    < > = values in this interval not displayed.   Cardiac Enzymes No results for input(s): TROPONINI in the last 168 hours. RADIOLOGY:  No results found. ASSESSMENT AND PLAN:  Melinda Maxwell IO25 y.o.femalewith medical history significant for CAD,chronicdiastolic CHF, paroxysmal A. Fib/DVTon Xarelto, Parkinson's disease, chronic dizziness and physical deconditioning, HTN who presentsto the emergency room by EMS for evaluation of worsening shortness of breath over the last 5 days  Hypertensive emergency(POA)-present on admission Labile HTN --Patient presents for evaluation of worsening shortness of breath and her blood  pressure on admission was 243/113 --Review of records shows she is supposed to be on metoprolol 50 mg twice daily but daughter stated it was discontinued 3 weeks ago by her primary care provider --Patient started on nitro drip --now d/ced --3/9-- BP earlier was high later in the day in the upper 90s with map 65 and above. -- Discussed with daughter regarding labile hypertension she voiced understanding. For now will continue amiodarone 200 mg daily, metoprolol 50 mg BID, Lasix 20 mg daily PRN. -- I have discontinued amlodipine. -- Patient was  seen by [facility] cardiology. they have sign off.  Acute on chronic diastolic dysfunction CHF Secondary to hypertensive emergency --BNP elevated 1200--414 --Echo from 2020 showed LVEF 60 to 65% -- received IV Lasix. Patient diuresis well. Urine output about 6.7 L since admsision --  Lasix 20 mg daily PRN. This was discussed with patient's daughter Melinda Maxwell agreeable.  AKI superimposed on chronic kidney disease stage IIIa -- baseline creatinine 1.0--1.2. GFR 50-59 Renal function mildly elevated likley 2/2 diuresis, however cannot r/o cardio renal synd.  Elevated troponin-mildly Likely due to demand ischemia No chest pain Likely in the setting of hypertensive emergency and acute on chronic diastolic heart failure Follow-up echocardiogram Mod/severe thickened MV. Left ventricular ejection fraction, by estimation, is 55 to 60%. The eft ventricle has normal function. The left ventricle has no regional wall motion abnormalities. Left ventricular diastolic parameters were normal  CAD (coronary artery disease) --Continue aspirin, statin, metoprolol   Paroxysmal atrial fibrillation with RVR --Heart rate controlled  --Continue amiodarone and metoprolol  --Continue Eliquis for primary prophylaxis for stroke prevention   Chronic dizziness -- PRN meclizine. I discussed with daughter 60 consider ENT evaluation as outpatient  Parkinson disease ([place]) --Continue carbidopa- levodopa  Dementia --Continue donepezil  GERD --Continue Protonix  Sick sinus syndrome --S/P pacemaker insertion in 09/2020    DVT prophylaxis: Eliquis Code Status: DNR Family Communication:  daughter Melinda Maxwell on the phone  Status is: Inpatient  Remains inpatient appropriate because:IV treatments appropriate due to intensity of illness or inability to take PO   Dispo: The patient is from: Home  Anticipated d/c is to: Home  Patient currently is  medically and best optimized for  d/c.              Difficult to place patient No                    Level of care: Med-Surg  TOC to work for rehab bed.    TOTAL TIME TAKING CARE OF THIS PATIENT: *25 minutes.  >50% time spent on counselling and coordination of care  Note: This dictation was prepared with Dragon dictation along with smaller phrase technology. Any transcriptional errors that result from this process are unintentional.  Fritzi Mandes M.D    [facility]   CC: Primary care physician; [name], MDPatient ID: Melinda Maxwell, female   DOB: 1941-05-24, 80 y.o.   MRN: [id number]

## 2020-12-15 NOTE — Progress Notes (Signed)
Physical Therapy Treatment Patient Details Name: Melinda Maxwell MRN: [id number] DOB: 1940-11-10 Today's Date: 12/15/2020    History of Present Illness presented to ER secondary to progressive SOB; admitted for management of hypertensive emergency, acute/chronic CHF exacerbation.    PT Comments    Patient transitioned to medical floor this date.  Agreeable to session, but continues to voice intermittent dizziness both at rest and with mobility tasks.  Continues to require min/mod assist for all transfers and short-distance gait; generally weak and unsteady.  Unable to tolerate gait beyond 3-4 steps due to weakness, dizziness; unsafe to attempt without RW and +1 at all times. Unable to tolerate distances required to negotiate home environment     Follow Up Recommendations  SNF;Supervision/Assistance - 24 hour;Supervision for mobility/OOB     Equipment Recommendations       Recommendations for Other Services       Precautions / Restrictions Precautions Precautions: Fall Restrictions Weight Bearing Restrictions: No    Mobility  Bed Mobility Overal bed mobility: Needs Assistance Bed Mobility: Supine to Sit     Supine to sit: Min assist;Mod assist     General bed mobility comments: assist for LE management and scooting towards head of bed    Transfers Overall transfer level: Needs assistance Equipment used: Rolling walker (2 wheeled) Transfers: Sit to/from Stand Sit to Stand: Min assist;Mod assist         General transfer comment: assist for lift off, standing balance; cuing for hand placement with transfers  Ambulation/Gait Ambulation/Gait assistance: Min assist;Mod assist Gait Distance (Feet):  (5' x3) Assistive device: Rolling walker (2 wheeled)       General Gait Details: 3-point, step to gait pattern; very short, shuffling steps; forward flexed posture with significant scoliotic curve towards R.  Poor balance, poor activity tolerance.  Continued reports of  dizziness with mobility tasks.   Stairs             Wheelchair Mobility    Modified Rankin (Stroke Patients Only)       Balance Overall balance assessment: Needs assistance Sitting-balance support: No upper extremity supported;Feet supported       Standing balance support: Bilateral upper extremity supported Standing balance-Leahy Scale: Fair                              Cognition Arousal/Alertness: Awake/alert Behavior During Therapy: WFL for tasks assessed/performed                                   General Comments: Alert and oriented to self, location; follow simple commands.  Min cuing for reorientation to time of day      Exercises Other Exercises Other Exercises: Toilet transfer, SPT with RW, min/mod assist for sit/stand transfers, short-distance gait.  GEnerally weak and unsteady; unable to tolerate gait beyond basic tranfsers.  Requires UE support to maintain balance with all mobility tasks. Other Exercises: Sit/stand x5 from various surface heights (edge of bed, BSC, recliner), min/mod assist.    General Comments        Pertinent Vitals/Pain Pain Assessment: No/denies pain    Home Living                      Prior Function            PT Goals (current goals can now be found in the  care plan section) Acute Rehab PT Goals Patient Stated Goal: to return home PT Goal Formulation: With patient Time For Goal Achievement: 12/26/20 Potential to Achieve Goals: Good Progress towards PT goals: Progressing toward goals    Frequency    Min 2X/week      PT Plan Current plan remains appropriate    Co-evaluation              AM-PAC PT "6 Clicks" Mobility   Outcome Measure  Help needed turning from your back to your side while in a flat bed without using bedrails?: A Little Help needed moving from lying on your back to sitting on the side of a flat bed without using bedrails?: A Lot Help needed moving to  and from a bed to a chair (including a wheelchair)?: A Little Help needed standing up from a chair using your arms (e.g., wheelchair or bedside chair)?: A Little Help needed to walk in hospital room?: A Lot Help needed climbing 3-5 steps with a railing? : A Lot 6 Click Score: 15    End of Session Equipment Utilized During Treatment: Gait belt Activity Tolerance: Patient tolerated treatment well Patient left: with call bell/phone within reach;in chair;with chair alarm set Nurse Communication: Mobility status PT Visit Diagnosis: Difficulty in walking, not elsewhere classified (R26.2)     Time: 3710-6269 PT Time Calculation (min) (ACUTE ONLY): 34 min  Charges:  $Therapeutic Activity: 23-37 mins                     Kristen H. Owens Shark, PT, DPT, NCS 12/15/20, 4:36 PM [phone]

## 2020-12-15 NOTE — Progress Notes (Signed)
Patient transferred to room 111. Report called to Jackelyn Poling, RN receiving patient.

## 2020-12-16 DIAGNOSIS — I5033 Acute on chronic diastolic (congestive) heart failure: Secondary | ICD-10-CM | POA: Diagnosis not present

## 2020-12-16 DIAGNOSIS — I2510 Atherosclerotic heart disease of native coronary artery without angina pectoris: Secondary | ICD-10-CM | POA: Diagnosis not present

## 2020-12-16 DIAGNOSIS — I161 Hypertensive emergency: Secondary | ICD-10-CM | POA: Diagnosis not present

## 2020-12-16 DIAGNOSIS — G20 Parkinson's disease: Secondary | ICD-10-CM | POA: Diagnosis not present

## 2020-12-16 LAB — BASIC METABOLIC PANEL
Anion gap: 8 (ref 5–15)
BUN: 42 mg/dL — ABNORMAL HIGH (ref 8–23)
CO2: 31 mmol/L (ref 22–32)
Calcium: 9.9 mg/dL (ref 8.9–10.3)
Chloride: 97 mmol/L — ABNORMAL LOW (ref 98–111)
Creatinine, Ser: 1.23 mg/dL — ABNORMAL HIGH (ref 0.44–1.00)
GFR, Estimated: 44 mL/min — ABNORMAL LOW (ref >60)
Glucose, Bld: 98 mg/dL (ref 70–99)
Potassium: 4.3 mmol/L (ref 3.5–5.1)
Sodium: 136 mmol/L (ref 135–145)

## 2020-12-16 LAB — RESP PANEL BY RT-PCR (FLU A&B, COVID) ARPGX2
Influenza A by PCR: NEGATIVE
Influenza B by PCR: NEGATIVE
SARS Coronavirus 2 by RT PCR: NEGATIVE

## 2020-12-16 LAB — CBC
HCT: 32.6 % — ABNORMAL LOW (ref 36.0–46.0)
Hemoglobin: 10 g/dL — ABNORMAL LOW (ref 12.0–15.0)
MCH: 27.8 pg (ref 26.0–34.0)
MCHC: 30.7 g/dL (ref 30.0–36.0)
MCV: 90.6 fL (ref 80.0–100.0)
Platelets: 433 10*3/uL — ABNORMAL HIGH (ref 150–400)
RBC: 3.6 MIL/uL — ABNORMAL LOW (ref 3.87–5.11)
RDW: 16.5 % — ABNORMAL HIGH (ref 11.5–15.5)
WBC: 6.8 10*3/uL (ref 4.0–10.5)
nRBC: 0 % (ref 0.0–0.2)

## 2020-12-16 MED ORDER — CARBIDOPA-LEVODOPA ER 25-100 MG PO TBCR: 1.0000 | EXTENDED_RELEASE_TABLET | Freq: Every day | ORAL | 1 refills | Status: AC

## 2020-12-16 MED ORDER — SCOPOLAMINE 1 MG/3DAYS TD PT72: 1.0000 | MEDICATED_PATCH | TRANSDERMAL | 12 refills | Status: AC

## 2020-12-16 MED ORDER — FUROSEMIDE 20 MG PO TABS
20.0000 mg | ORAL_TABLET | Freq: Every day | ORAL | 0 refills | Status: AC | PRN
Start: 2020-12-16 — End: ?

## 2020-12-16 MED ORDER — CARBIDOPA-LEVODOPA 25-100 MG PO TABS: 2.0000 | ORAL_TABLET | Freq: Three times a day (TID) | ORAL | 1 refills | Status: AC

## 2020-12-16 MED ORDER — AMIODARONE HCL 200 MG PO TABS: 200.0000 mg | ORAL_TABLET | Freq: Every day | ORAL | 0 refills | Status: AC

## 2020-12-16 MED ORDER — COVID-19 MRNA VAC-TRIS(PFIZER) 30 MCG/0.3ML IM SUSP
0.3000 mL | Freq: Once | INTRAMUSCULAR | Status: AC
  Administered 2020-12-16: 0.3 mL via INTRAMUSCULAR
  Filled 2020-12-16: qty 0.3

## 2020-12-16 MED ORDER — MECLIZINE HCL 12.5 MG PO TABS
12.5000 mg | ORAL_TABLET | Freq: Two times a day (BID) | ORAL | 0 refills | Status: AC | PRN
Start: 2020-12-16 — End: ?

## 2020-12-16 NOTE — Care Management Important Message (Signed)
Important Message  Patient Details  Name: Melinda Maxwell MRN: [id number] Date of Birth: [birth year]   Medicare Important Message Given:  Yes     Melinda Maxwell 12/16/2020, 10:39 AM

## 2020-12-16 NOTE — Progress Notes (Signed)
Report called to [facility] [place] facility.

## 2020-12-16 NOTE — Discharge Summary (Addendum)
[facility] [place] NAME: Melinda Maxwell    MR#:  [id number]  DATE OF BIRTH:  [birth year]  DATE OF ADMISSION:  12/11/2020 ADMITTING PHYSICIAN: Collier Bullock, MD  DATE OF DISCHARGE: 12/16/2020  PRIMARY CARE PHYSICIAN: [name], MD    ADMISSION DIAGNOSIS:  Acute pulmonary edema ([place]) [J81.0] SOB (shortness of breath) [R06.02] Hypoxia [R09.02] Mild persistent asthma with exacerbation [J45.31] Hypertensive emergency [I16.1] Acute on chronic congestive heart failure, unspecified heart failure type ([place]) [I50.9]  DISCHARGE DIAGNOSIS:  Hypertensive emergency with acute on chronic congestive heart failure diastolic  SECONDARY DIAGNOSIS:   Past Medical History:  Diagnosis Date  . Arthritis   . Asthma without status asthmaticus    unspecified  . Breast cancer ([place])   . Coronary artery disease   . Diverticulosis   . DVT (deep venous thrombosis) ([place])   . Edema   . Gout   . Hyperlipidemia   . Hypertension   . MI, old   . Myocardial infarct ([place]) 2004  . Parkinson's disease ([place])   . Sleep apnea     HOSPITAL COURSE:  Melinda Maxwell OI80 y.o.femalewith medical history significant for CAD,chronicdiastolic CHF, paroxysmal A. Fib/DVTon Xarelto, Parkinson's disease, chronic dizziness and physical deconditioning, HTN who presentsto the emergency room by EMS for evaluation of worsening shortness of breath over the last 5 days  Hypertensive emergency(POA)-present on admission Labile HTN --Patient presents for evaluation of worsening shortness of breath and her blood pressure on admission was 243/113 --Review of records shows she is supposed to be on metoprolol 50 mg twice daily but daughter stated it was discontinued 3 weeks ago by her primary care provider --Patient started on nitro drip--now d/ced --3/9-- BP earlier was high later in the day in the upper 90s with map 82 and above. -- Discussed with daughter regarding  labile hypertension she voiced understanding. For now will continue amiodarone 200 mg daily, metoprolol 50 mg BID, Lasix 20 mg daily PRN. -- I have discontinued amlodipine. -- Patient was [facility] cardiology. they have sign off. --3/11--BP overall reasonably well controlled  Acute on chronic diastolic dysfunction CHF Secondary to hypertensive emergency --BNP elevated 1200--414 --Echo from 2020 showed LVEF 60 to 65% -- received IV Lasix. Patient diuresis well. Urine output about 6.7 L since admsision --  Lasix 20 mg daily PRN. This was discussed with patient's daughter Melinda Maxwell  And she isagreeable.  AKI superimposed on chronic kidney disease stage IIIa -- baseline creatinine 1.0--1.2. GFR 50-59 -Renal function mildly elevated likley 2/2 diuresis, however cannot r/o cardio renal synd.  Elevated troponin-mildly Likely due to demand ischemia --No chest pain ---Likely in the setting of hypertensive emergency and acute on chronic diastolic heart failure -Follow-up echocardiogram Mod/severe thickened MV. Left ventricular ejection fraction, by estimation, is 55 to 60%. The eft ventricle has normal function. The left ventricle has no regional wall motion abnormalities. Left ventricular diastolic parameters were normal  CAD (coronary artery disease) --Continue aspirin, statin, metoprolol   Paroxysmal atrial fibrillation with RVR --Heart rate controlled  --Continue amiodarone and metoprolol  --Continue Eliquis for primary prophylaxis for stroke prevention   Chronic dizziness -- PRN meclizine and scopolamine patch -- I discussed with daughter 80 consider ENT evaluation as outpatient  Parkinson disease ([place]) --Continue carbidopa- levodopa  Dementia --Continue donepezil  GERD --Continue Protonix  Sick sinus syndrome --S/P pacemaker insertion in 09/2020    DVT prophylaxis:Eliquis Code Status: DNR Family Communication: daughter Melinda Maxwell on the phone  3/11. She is  aware of patient being discharged  Status is: Inpatient  Dispo: The patient is from: Home Anticipated d/c is to: SNF Patient currently is  medically and best optimized for d/c.   Level of care: Med-Surg  Patient will discharged to [facility] today. [facility] will follow for Outpatient Palliative care. CONSULTS OBTAINED:    DRUG ALLERGIES:   Allergies  Allergen Reactions  . Benzocaine Other (See Comments)    Unknown  . Lisinopril Cough  . Neomycin Other (See Comments)    On allergy testing  . Sulfa Antibiotics Rash  . Sulfamethoxazole-Trimethoprim Other (See Comments)    Unknown    DISCHARGE MEDICATIONS:   Allergies as of 12/16/2020      Reactions   Benzocaine Other (See Comments)   Unknown   Lisinopril Cough   Neomycin Other (See Comments)   On allergy testing   Sulfa Antibiotics Rash   Sulfamethoxazole-trimethoprim Other (See Comments)   Unknown      Medication List    STOP taking these medications   colchicine 0.6 MG tablet   magnesium oxide 400 (241.3 Mg) MG tablet Commonly known as: MAG-OX   mometasone 0.1 % cream Commonly known as: ELOCON   mometasone 0.1 % lotion Commonly known as: ELOCON     TAKE these medications   allopurinol 300 MG tablet Commonly known as: ZYLOPRIM Take 300 mg by mouth daily.   amiodarone 200 MG tablet Commonly known as: PACERONE Take 1 tablet (200 mg total) by mouth daily. What changed: when to take this   apixaban 2.5 MG Tabs tablet Commonly known as: ELIQUIS Take 1 tablet (2.5 mg total) by mouth 2 (two) times daily.   aspirin 81 MG chewable tablet Chew 1 tablet (81 mg total) by mouth daily.   carbidopa-levodopa 25-100 MG tablet Commonly known as: SINEMET IR Take 2 tablets by mouth with breakfast, with lunch, and with evening meal. What changed:   how much to take  when to take this  additional instructions   Carbidopa-Levodopa ER  25-100 MG tablet controlled release Commonly known as: SINEMET CR Take 1 tablet by mouth at bedtime. What changed:   how much to take  when to take this   donepezil 10 MG tablet Commonly known as: ARICEPT Take 10 mg by mouth at bedtime.   feeding supplement Liqd Take 237 mLs by mouth 2 (two) times daily between meals.   furosemide 20 MG tablet Commonly known as: LASIX Take 1 tablet (20 mg total) by mouth daily as needed for edema or fluid.   meclizine 12.5 MG tablet Commonly known as: ANTIVERT Take 1 tablet (12.5 mg total) by mouth 2 (two) times daily as needed for dizziness.   metoprolol tartrate 50 MG tablet Commonly known as: LOPRESSOR Take 1 tablet (50 mg total) by mouth 2 (two) times daily.   multivitamin with minerals Tabs tablet Take 1 tablet by mouth daily.   nitroGLYCERIN 0.4 MG SL tablet Commonly known as: NITROSTAT Place 0.4 mg under the tongue every 5 (five) minutes as needed for chest pain.   pantoprazole 40 MG tablet Commonly known as: PROTONIX Take 40 mg by mouth daily.   scopolamine 1 MG/3DAYS Commonly known as: TRANSDERM-SCOP Place 1 patch (1.5 mg total) onto the skin every 3 (three) days. Start taking on: December 17, 2020   senna-docusate 8.6-50 MG tablet Commonly known as: Senokot-S Take 2 tablets by mouth 2 (two) times daily.   simvastatin 10 MG tablet Commonly known  as: ZOCOR Take 10 mg by mouth daily. HLD- reduced d/t interaction with Diltiazem- increased r/f Rhabdomyelosis   Vitamin D3 50 MCG (2000 UT) capsule Take 2,000 Units by mouth daily.       If you experience worsening of your admission symptoms, develop shortness of breath, life threatening emergency, suicidal or homicidal thoughts you must seek medical attention immediately by calling 911 or calling your MD immediately  if symptoms less severe.  You Must read complete instructions/literature along with all the possible adverse reactions/side effects for all the Medicines you  take and that have been prescribed to you. Take any new Medicines after you have completely understood and accept all the possible adverse reactions/side effects.   Please note  You were cared for by a hospitalist during your hospital stay. If you have any questions about your discharge medications or the care you received while you were in the hospital after you are discharged, you can call the unit and asked to speak with the hospitalist on call if the hospitalist that took care of you is not available. Once you are discharged, your primary care physician will handle any further medical issues. Please note that NO REFILLS for any discharge medications will be authorized once you are discharged, as it is imperative that you return to your primary care physician (or establish a relationship with a primary care physician if you do not have one) for your aftercare needs so that they can reassess your need for medications and monitor your lab values. Today   SUBJECTIVE   Overall doing well  VITAL SIGNS:  Blood pressure (!) 171/87, pulse 60, temperature (!) 97.5 F (36.4 C), resp. rate 16, height 5\' 1"  (1.549 m), weight 50.8 kg, SpO2 100 %.  I/O:    Intake/Output Summary (Last 24 hours) at 12/16/2020 1015 Last data filed at 12/16/2020 0826 Gross per 24 hour  Intake 300 ml  Output 400 ml  Net -100 ml    PHYSICAL EXAMINATION:  GENERAL:  80 y.o.-year-old patient lying in the bed with no acute distress.  LUNGS: Normal breath sounds bilaterally, no wheezing, rales,rhonchi or crepitation. No use of accessory muscles of respiration.  CARDIOVASCULAR: S1, S2 normal. No murmurs, rubs, or gallops.  ABDOMEN: Soft, non-tender, non-distended. Bowel sounds present. No organomegaly or mass.  EXTREMITIES: No pedal edema, cyanosis, or clubbing.  NEUROLOGIC: grossly nonfocal PSYCHIATRIC: The patient is alert and oriented x 3.  SKIN: No obvious rash, lesion, or ulcer.   DATA REVIEW:   CBC  Recent Labs   Lab 12/16/20 0436  WBC 6.8  HGB 10.0*  HCT 32.6*  PLT 433*    Chemistries  Recent Labs  Lab 12/11/20 0828 12/12/20 0617 12/15/20 0754 12/16/20 0436  NA 141   < > 137 136  K 3.7   < > 4.4 4.3  CL 107   < > 95* 97*  CO2 21*   < > 31 31  GLUCOSE 116*   < > 90 98  BUN 16   < > 47* 42*  CREATININE 1.10*   < > 1.39* 1.23*  CALCIUM 9.7   < > 10.1 9.9  MG  --   --  2.3  --   AST 23  --   --   --   ALT 15  --   --   --   ALKPHOS 95  --   --   --   BILITOT 1.0  --   --   --    < > =  values in this interval not displayed.    Microbiology Results   Recent Results (from the past 240 hour(s))  Resp Panel by RT-PCR (Flu A&B, Covid) Nasopharyngeal Swab     Status: None   Collection Time: 12/11/20  9:03 AM   Specimen: Nasopharyngeal Swab; Nasopharyngeal(NP) swabs in vial transport medium  Result Value Ref Range Status   SARS Coronavirus 2 by RT PCR NEGATIVE NEGATIVE Final    Comment: (NOTE) SARS-CoV-2 target nucleic acids are NOT DETECTED.  The SARS-CoV-2 RNA is generally detectable in upper respiratory specimens during the acute phase of infection. The lowest concentration of SARS-CoV-2 viral copies this assay can detect is 138 copies/mL. A negative result does not preclude SARS-Cov-2 infection and should not be used as the sole basis for treatment or other patient management decisions. A negative result may occur with  improper specimen collection/handling, submission of specimen other than nasopharyngeal swab, presence of viral mutation(s) within the areas targeted by this assay, and inadequate number of viral copies(<138 copies/mL). A negative result must be combined with clinical observations, patient history, and epidemiological information. The expected result is Negative.  Fact Sheet for Patients:  EntrepreneurPulse.com.au  Fact Sheet for Healthcare Providers:  IncredibleEmployment.be  This test is no t yet approved or cleared  by the [place] FDA and  has been authorized for detection and/or diagnosis of SARS-CoV-2 by FDA under an Emergency Use Authorization (EUA). This EUA will remain  in effect (meaning this test can be used) for the duration of the COVID-19 declaration under Section 564(b)(1) of the Act, 21 U.S.C.section 360bbb-3(b)(1), unless the authorization is terminated  or revoked sooner.       Influenza A by PCR NEGATIVE NEGATIVE Final   Influenza B by PCR NEGATIVE NEGATIVE Final    Comment: (NOTE) The Xpert Xpress SARS-CoV-2/FLU/RSV plus assay is intended as an aid in the diagnosis of influenza from Nasopharyngeal swab specimens and should not be used as a sole basis for treatment. Nasal washings and aspirates are unacceptable for Xpert Xpress SARS-CoV-2/FLU/RSV testing.  Fact Sheet for Patients: EntrepreneurPulse.com.au  Fact Sheet for Healthcare Providers: IncredibleEmployment.be  This test is not yet approved or cleared by the [place] FDA and has been authorized for detection and/or diagnosis of SARS-CoV-2 by FDA under an Emergency Use Authorization (EUA). This EUA will remain in effect (meaning this test can be used) for the duration of the COVID-19 declaration under Section 564(b)(1) of the Act, 21 U.S.C. section 360bbb-3(b)(1), unless the authorization is terminated or revoked.  Performed at [facility], [address] [place], [place] [address]   MRSA PCR Screening     Status: None   Collection Time: 12/11/20 10:59 AM   Specimen: Nasal Mucosa; Nasopharyngeal  Result Value Ref Range Status   MRSA by PCR NEGATIVE NEGATIVE Final    Comment:        The GeneXpert MRSA Assay (FDA approved for NASAL specimens only), is one component of a comprehensive MRSA colonization surveillance program. It is not intended to diagnose MRSA infection nor to guide or monitor treatment for MRSA infections. Performed at [facility], [address] [place], [place] [address]     RADIOLOGY:  No results found.   CODE STATUS:     Code Status Orders  (From admission, onward)         Start     Ordered   12/11/20 1015  Do not attempt resuscitation (DNR)  Continuous       Question  Answer Comment  In the event of cardiac or respiratory ARREST Do not call a "code blue"   In the event of cardiac or respiratory ARREST Do not perform Intubation, CPR, defibrillation or ACLS   In the event of cardiac or respiratory ARREST Use medication by any route, position, wound care, and other measures to relive pain and suffering. May use oxygen, suction and manual treatment of airway obstruction as needed for comfort.   Comments Code status was discussed with patient who wishes to be a DNR      12/11/20 1017        Code Status History    Date Active Date Inactive Code Status Order ID Comments User Context   09/29/2020 0303 10/06/2020 2004 Full Code [id number]  Athena Masse, MD ED   07/16/2019 0608 07/25/2019 1905 Full Code [id number]  Vianne Bulls, MD ED   02/18/2018 1558 02/26/2018 2110 Full Code [id number]  Nicholes Mango, MD Inpatient   02/11/2018 1005 02/12/2018 1833 Full Code [id number]  End, Harrell Gave, MD Inpatient   Advance Care Planning Activity    Advance Directive Documentation   Flowsheet Row Most Recent Value  Type of Advance Directive Living will  Pre-existing out of facility DNR order (yellow form or pink MOST form) --  "MOST" Form in Place? --       TOTAL TIME TAKING CARE OF THIS PATIENT: *35 minutes.    Fritzi Mandes M.D  [facility]    CC: Primary care physician; [name], MD

## 2020-12-16 NOTE — TOC Progression Note (Signed)
Transition of Care [facility]) - Progression Note    Patient Details  Name: Melinda Maxwell MRN: [id number] Date of Birth: [birth year]  Transition of Care [facility]) CM/SW Contact  Shelbie Hutching, RN Phone Number: 12/16/2020, 12:33 PM  Clinical Narrative:    COVID booster given.  EMS has been arranged there are a few transports ahead of her.    Expected Discharge Plan: Skilled Nursing Facility Barriers to Discharge: Insurance Authorization  Expected Discharge Plan and Services Expected Discharge Plan: [place] In-house Referral: Clinical Social Work   Post Acute Care Choice: [place] Living arrangements for the past 2 months: Single Family Home Expected Discharge Date: 12/16/20                         HH Arranged: NA           Social Determinants of Health (SDOH) Interventions    Readmission Risk Interventions No flowsheet data found.

## 2020-12-16 NOTE — Progress Notes (Signed)
Patient discharged in stable condition via EMS.

## 2020-12-16 NOTE — TOC Transition Note (Signed)
Transition of Care [facility]) - CM/SW Discharge Note   Patient Details  Name: Melinda Maxwell MRN: [id number] Date of Birth: [birth year]  Transition of Care [facility]) CM/SW Contact:  Shelbie Hutching, RN Phone Number: 12/16/2020, 11:14 AM   Clinical Narrative:    Patient is medically cleared for discharge to [facility] in [place].  Insurance authorization is pending but should be back soon.  Patient will receive her covid booster before discharge.  COVID test pending for discharge.  Patient's daughter [name] updated on discharge plan for today.  Once COVID results and insurance auth comes back RNCM will arrange transport.    Final next level of care: Skilled Nursing Facility Barriers to Discharge: Insurance Authorization   Patient Goals and CMS Choice Patient states their goals for this hospitalization and ongoing recovery are:: Daughter wants patiet to go to [employer].gov Compare Post Acute Care list provided to:: Patient Represenative (must comment) Choice offered to / list presented to : Adult Children  Discharge Placement              Patient chooses bed at: [place], [place] Patient to be transferred to facility by: [facility] Name of family member notified: Salene Mohamud daughter Patient and family notified of of transfer: 12/16/20  Discharge Plan and Services In-house Referral: Clinical Social Work   Post Acute Care Choice: [place]                    HH Arranged: NA          Social Determinants of Health (SDOH) Interventions     Readmission Risk Interventions No flowsheet data found.

## 2020-12-16 NOTE — Progress Notes (Signed)
[facility]) Hospital Liaison RN note:  Received new referral for [facility] out patient palliative program to follow post discharge from [place], TOC. Patient information given to referral. Plan is for discharge today to [facility] in [place].  Thank you for this referral.  Zandra Abts, RN [facility] Liaison [phone]

## 2020-12-19 ENCOUNTER — Ambulatory Visit: Payer: Medicare Other | Admitting: Family

## 2020-12-23 ENCOUNTER — Other Ambulatory Visit: Payer: Self-pay

## 2020-12-23 ENCOUNTER — Non-Acute Institutional Stay: Payer: Self-pay | Admitting: Hospice

## 2020-12-23 DIAGNOSIS — R531 Weakness: Secondary | ICD-10-CM

## 2020-12-23 DIAGNOSIS — I5033 Acute on chronic diastolic (congestive) heart failure: Secondary | ICD-10-CM

## 2020-12-23 DIAGNOSIS — Z515 Encounter for palliative care: Secondary | ICD-10-CM

## 2020-12-23 NOTE — Progress Notes (Signed)
PATIENT NAME: Melinda Maxwell [id number] [phone] (home)  DOB: 03-May-1941 MRN: [id number]  PRIMARY CARE PROVIDER:    [name], MD,  [address] [facility] [place] [address] [phone]  Dr. Josetta Huddle  RESPONSIBLE PARTY: Self [phone] Emergency contact:Daughter - Reighn Kaplan [phone]  CHIEF COMPLAIN: Initial palliative care visit/weakness  Visit is to build trust and highlight Palliative Medicine as specialized medical care for people living with serious illness, aimed at facilitating better quality of life through symptoms relief, assisting with advance care plan and establishing goals of care. Patient endorsed palliative services. NP called Suzan and left her a voicemail with call back number.  Discussion on the difference between Palliative and Hospice care.  Visit consisted of counseling and education dealing with the complex and emotionally intense issues of symptom management and palliative care in the setting of serious and potentially life-threatening illness. Palliative care team will continue to support patient, patient's family, and medical team.  RECOMMENDATIONS/PLAN:   Advance Care Planning: Our advance care planning conversation included a discussion about  the value and importance of advance care planning, exploration of goals of care in the event of a sudden injury or illness, identification and preparation of a healthcare agent and review and updating or creation of an advance directive document. Provided general support, encouragement and ample emotional support.  CODE STATUS: Patient affirmed she is a DO NOT RESUSCITATE.  Signed DNR form in facility records; same document uploaded to epic today.  GOALS OF CARE: Goals of care include to maximize quality of life and symptom management.  I spent 46 minutes providing this initial consultation. More than 50% of the time in this consultation  was spent on counseling patient and coordinating communication. -------------------------------------------------------------------------------------------------------------------------------------- Symptom Management/Plan:  Weakness: Continue OT/PT for self-care/strengthening, gait training neuromuscular reeducation.  Balance of rest and performance activity.  Falls precautions. 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 Continue Aricept and Sinemet as ordered. Continue ongoing support care. FAST 6c, FLACC 0.  Edema and shortness of breath related to  diastolic CHF is under good control at this time.  Continue Meclizine for dizziness - effective.  Palliative will continue to monitor for symptom management/decline and make recommendations as needed. Return 6 weeks or prn. Encouraged to call provider sooner with any concerns.   PPS: 50%  Family /Caregiver/Community Supports: Patient in SNF for acute rehab   HISTORY OF PRESENT ILLNESS:  Melinda Maxwell is a 80 y.o. female with multiple medical problems including generalized body weakness which is acute on chronic, worse since patient's recent hospitalization 3/6-3/08/2021 for shortness of breath and hypoxia related to acute on chronic congestive heart failure.  Patient was diuresed, treated, stabilized and discharged to SNF for acute rehab.  Weakness impairs patient's independence; worse on waking up in the morning, improves during the day and with ongoing therapy. Patient reports breathing has significantly improved but experiences shortness of breath on moderate exertion like when she walks up and down the  Nursing hall - 50 feet.  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  History obtained from review of EMR, discussion with patient/family.   Review and summarization of Epic records shows history from other than patient. Rest of 10 point ROS asked and  negative.  Palliative Care was asked to follow this patient by consultation request of [name], MD to help address  complex decision making in the context of advance care planning and goals of care clarification.    HOSPICE ELIGIBILITY/DIAGNOSIS: TBD  PAST MEDICAL HISTORY:  Past Medical History:  Diagnosis Date  . Arthritis   . Asthma without status asthmaticus    unspecified  . Breast cancer ([place])   . Coronary artery disease   . Diverticulosis   . DVT (deep venous thrombosis) ([place])   . Edema   . Gout   . Hyperlipidemia   . Hypertension   . MI, old   . Myocardial infarct ([place]) 2004  . Parkinson's disease ([place])   . Sleep apnea      SOCIAL HX:  Social History   Tobacco Use  . Smoking status: Never Smoker  . Smokeless tobacco: Never Used  Substance Use Topics  . Alcohol use: No     FAMILY HX:  Family History  Problem Relation Age of Onset  . Angina Mother   . Hypertension Mother   . Lung cancer Mother   . Hypertension Father   . Heart attack Father   . Nephrolithiasis Sister   . Hypertension Other        sibling    Review of lab tests/diagnostics   No results for input(s): WBC, HGB, HCT, PLT, MCV in the last 168 hours. No results for input(s): NA, K, CL, CO2, BUN, CREATININE, GLUCOSE in the last 168 hours. Latest GFR by Cockcroft Gault (not valid in AKI or ESRD) Estimated Creatinine Clearance: 27.1 mL/min (A) (by C-G formula based on SCr of 1.23 mg/dL (H)). No results for input(s): AST, ALT, ALKPHOS, GGT in the last 168 hours.  Invalid input(s): TBILI, CONJBILI, ALB, TOTALPROTEIN No components found for: ALB No results for input(s): APTT, INR in the last 168 hours.  Invalid input(s): PTPATIENT No results for input(s): BNP, PROBNP in the last 168 hours.  ALLERGIES:  Allergies  Allergen Reactions  . Benzocaine Other (See Comments)    Unknown  . Lisinopril Cough  . Neomycin Other (See Comments)    On allergy testing  . Sulfa Antibiotics  Rash  . Sulfamethoxazole-Trimethoprim Other (See Comments)    Unknown      PERTINENT MEDICATIONS:  Outpatient Encounter Medications as of 12/23/2020  Medication Sig  . allopurinol (ZYLOPRIM) 300 MG tablet Take 300 mg by mouth daily.  Marland Kitchen amiodarone (PACERONE) 200 MG tablet Take 1 tablet (200 mg total) by mouth daily.  Marland Kitchen apixaban (ELIQUIS) 2.5 MG TABS tablet Take 1 tablet (2.5 mg total) by mouth 2 (two) times daily.  Marland Kitchen aspirin 81 MG chewable tablet Chew 1 tablet (81 mg total) by mouth daily.  . carbidopa-levodopa (SINEMET IR) 25-100 MG tablet Take 2 tablets by mouth with breakfast, with lunch, and with evening meal.  . Carbidopa-Levodopa ER (SINEMET CR) 25-100 MG tablet controlled release Take 1 tablet by mouth at bedtime.  . Cholecalciferol (VITAMIN D3) 50 MCG (2000 UT) capsule Take 2,000 Units by mouth daily.  Marland Kitchen donepezil (ARICEPT) 10 MG tablet Take 10 mg by mouth at bedtime.  . feeding supplement (ENSURE ENLIVE / ENSURE PLUS) LIQD Take 237 mLs by mouth 2 (two) times daily between meals.  . furosemide (LASIX) 20 MG tablet Take 1 tablet (20 mg total) by mouth daily as needed for edema or fluid.  Marland Kitchen meclizine (ANTIVERT) 12.5 MG tablet Take 1 tablet (12.5 mg total) by mouth 2 (two) times daily as needed for dizziness.  . metoprolol tartrate (LOPRESSOR) 50 MG tablet Take 1 tablet (50 mg total) by mouth  2 (two) times daily. (Patient not taking: No sig reported)  . Multiple Vitamin (MULTIVITAMIN WITH MINERALS) TABS tablet Take 1 tablet by mouth daily.  . nitroGLYCERIN (NITROSTAT) 0.4 MG SL tablet Place 0.4 mg under the tongue every 5 (five) minutes as needed for chest pain.   . pantoprazole (PROTONIX) 40 MG tablet Take 40 mg by mouth daily.   Marland Kitchen scopolamine (TRANSDERM-SCOP) 1 MG/3DAYS Place 1 patch (1.5 mg total) onto the skin every 3 (three) days.  Marland Kitchen senna-docusate (SENOKOT-S) 8.6-50 MG tablet Take 2 tablets by mouth 2 (two) times daily.  . simvastatin (ZOCOR) 10 MG tablet Take 10 mg by mouth daily.  HLD- reduced d/t interaction with Diltiazem- increased r/f Rhabdomyelosis   No facility-administered encounter medications on file as of 12/23/2020.   ROS  General: NAD EYES: No vision changes ENMT: No dysphagia no xerostomia Cardiovascular: No chest pain Pulmonary: No cough, SOB  Abdomen:no constipation or diarrhea GU: No dysuria or urinary frequency MSK:   ROM limitations, no falls reported Skin: No rashes or wounds Neurological: weakness Psych:  positive mood Heme/lymph/immuno: No bruises or abnormal bleeding   PHYSICAL EXAM  Height: 5 feet 1 incges  Weight:117.4 Ibs Constitutional: In no acute distress, cooperative Cardiovascular: regular rate and rhythm; no edema in BLE Pulmonary: no cough, no increased work of breathing during visit, normal respiratory effort on room air Abdomen: soft, non tender, positive bowel sounds in all quadrants GU:  no suprapubic tenderness Eyes: Normal lids, no discharge, sclera anicteric ENMT: Moist mucous membranes Musculoskeletal: ambulatory with rolling walker Skin: no rash to visible skin, dry skin,warm without cyanosis Psych: non-anxious affect Neurological: Weakness but otherwise non focal Heme/lymph/immuno: no widespread bruises, no bleeding  Thank you for the opportunity to participate in the care of FAUSTINE TATES Please call our office at [phone] if we can be of additional assistance.  Note: Portions of this note were generated with [employer]. Dictation errors may occur despite best attempts at proofreading.  Teodoro Spray, NP

## 2022-01-06 DEATH — deceased
